# Patient Record
Sex: Female | Born: 1935
Health system: Southern US, Community
[De-identification: ages and names within clinical notes are randomized; demographics above are authoritative.]

## PROBLEM LIST (undated history)

## (undated) DIAGNOSIS — Z9289 Personal history of other medical treatment: Secondary | ICD-10-CM

## (undated) DIAGNOSIS — S82409A Unspecified fracture of shaft of unspecified fibula, initial encounter for closed fracture: Secondary | ICD-10-CM

## (undated) DIAGNOSIS — E119 Type 2 diabetes mellitus without complications: Secondary | ICD-10-CM

## (undated) DIAGNOSIS — N183 Chronic kidney disease, stage 3 unspecified: Secondary | ICD-10-CM

## (undated) DIAGNOSIS — E039 Hypothyroidism, unspecified: Secondary | ICD-10-CM

## (undated) DIAGNOSIS — A159 Respiratory tuberculosis unspecified: Secondary | ICD-10-CM

## (undated) DIAGNOSIS — S82209A Unspecified fracture of shaft of unspecified tibia, initial encounter for closed fracture: Secondary | ICD-10-CM

## (undated) DIAGNOSIS — I10 Essential (primary) hypertension: Secondary | ICD-10-CM

## (undated) DIAGNOSIS — J189 Pneumonia, unspecified organism: Secondary | ICD-10-CM

## (undated) HISTORY — PX: TONSILLECTOMY: SUR1361

## (undated) HISTORY — PX: CATARACT EXTRACTION W/ INTRAOCULAR LENS  IMPLANT, BILATERAL: SHX1307

## (undated) HISTORY — PX: FRACTURE SURGERY: SHX138

## (undated) HISTORY — PX: HIP FRACTURE SURGERY: SHX118

---

## 1999-04-11 ENCOUNTER — Ambulatory Visit (HOSPITAL_COMMUNITY): Admission: RE | Admit: 1999-04-11 | Discharge: 1999-04-11 | Payer: Self-pay | Admitting: Gastroenterology

## 1999-11-08 ENCOUNTER — Ambulatory Visit (HOSPITAL_COMMUNITY): Admission: RE | Admit: 1999-11-08 | Discharge: 1999-11-08 | Payer: Self-pay | Admitting: Endocrinology

## 2000-08-25 ENCOUNTER — Encounter: Payer: Self-pay | Admitting: Endocrinology

## 2000-08-25 ENCOUNTER — Encounter: Admission: RE | Admit: 2000-08-25 | Discharge: 2000-08-25 | Payer: Self-pay | Admitting: Endocrinology

## 2001-06-23 ENCOUNTER — Ambulatory Visit (HOSPITAL_COMMUNITY): Admission: RE | Admit: 2001-06-23 | Discharge: 2001-06-23 | Payer: Self-pay | Admitting: Endocrinology

## 2001-08-28 ENCOUNTER — Encounter: Payer: Self-pay | Admitting: Endocrinology

## 2001-08-28 ENCOUNTER — Encounter: Admission: RE | Admit: 2001-08-28 | Discharge: 2001-08-28 | Payer: Self-pay | Admitting: Endocrinology

## 2002-09-07 ENCOUNTER — Encounter: Payer: Self-pay | Admitting: Endocrinology

## 2002-09-07 ENCOUNTER — Encounter: Admission: RE | Admit: 2002-09-07 | Discharge: 2002-09-07 | Payer: Self-pay | Admitting: Endocrinology

## 2002-11-05 ENCOUNTER — Emergency Department (HOSPITAL_COMMUNITY): Admission: EM | Admit: 2002-11-05 | Discharge: 2002-11-05 | Payer: Self-pay | Admitting: Emergency Medicine

## 2002-11-05 ENCOUNTER — Encounter: Payer: Self-pay | Admitting: Emergency Medicine

## 2003-09-12 ENCOUNTER — Encounter: Admission: RE | Admit: 2003-09-12 | Discharge: 2003-09-12 | Payer: Self-pay | Admitting: Endocrinology

## 2003-09-12 ENCOUNTER — Encounter: Payer: Self-pay | Admitting: Endocrinology

## 2004-10-11 ENCOUNTER — Encounter: Admission: RE | Admit: 2004-10-11 | Discharge: 2004-10-11 | Payer: Self-pay | Admitting: Endocrinology

## 2005-10-25 ENCOUNTER — Encounter: Admission: RE | Admit: 2005-10-25 | Discharge: 2005-10-25 | Payer: Self-pay | Admitting: Endocrinology

## 2005-11-23 ENCOUNTER — Emergency Department (HOSPITAL_COMMUNITY): Admission: EM | Admit: 2005-11-23 | Discharge: 2005-11-23 | Payer: Self-pay | Admitting: Emergency Medicine

## 2006-11-06 ENCOUNTER — Encounter: Admission: RE | Admit: 2006-11-06 | Discharge: 2006-11-06 | Payer: Self-pay | Admitting: Endocrinology

## 2007-11-09 ENCOUNTER — Encounter: Admission: RE | Admit: 2007-11-09 | Discharge: 2007-11-09 | Payer: Self-pay | Admitting: Endocrinology

## 2008-01-11 ENCOUNTER — Encounter: Admission: RE | Admit: 2008-01-11 | Discharge: 2008-01-11 | Payer: Self-pay | Admitting: Endocrinology

## 2008-01-22 ENCOUNTER — Encounter: Admission: RE | Admit: 2008-01-22 | Discharge: 2008-01-22 | Payer: Self-pay | Admitting: Endocrinology

## 2008-11-09 ENCOUNTER — Encounter: Admission: RE | Admit: 2008-11-09 | Discharge: 2008-11-09 | Payer: Self-pay | Admitting: Endocrinology

## 2009-11-17 ENCOUNTER — Encounter: Admission: RE | Admit: 2009-11-17 | Discharge: 2009-11-17 | Payer: Self-pay | Admitting: Endocrinology

## 2010-01-11 ENCOUNTER — Encounter: Admission: RE | Admit: 2010-01-11 | Discharge: 2010-01-11 | Payer: Self-pay | Admitting: Endocrinology

## 2010-01-24 ENCOUNTER — Encounter: Admission: RE | Admit: 2010-01-24 | Discharge: 2010-01-24 | Payer: Self-pay | Admitting: Endocrinology

## 2011-01-20 ENCOUNTER — Encounter: Payer: Self-pay | Admitting: Endocrinology

## 2011-02-08 ENCOUNTER — Other Ambulatory Visit: Payer: Self-pay | Admitting: Endocrinology

## 2011-11-19 ENCOUNTER — Other Ambulatory Visit: Payer: Self-pay | Admitting: Endocrinology

## 2011-11-19 MED ORDER — PROLIA 60 MG/ML ~~LOC~~ SOLN: 60.0000 mg | Freq: Once | SUBCUTANEOUS | Status: DC

## 2011-11-20 ENCOUNTER — Other Ambulatory Visit: Payer: Self-pay | Admitting: Endocrinology

## 2011-11-25 ENCOUNTER — Other Ambulatory Visit (HOSPITAL_COMMUNITY): Payer: Self-pay | Admitting: *Deleted

## 2011-11-27 ENCOUNTER — Ambulatory Visit (HOSPITAL_COMMUNITY)
Admission: RE | Admit: 2011-11-27 | Discharge: 2011-11-27 | Disposition: A | Discharge status: Home / Self Care | Payer: Medicare PPO | Source: Ambulatory Visit | Attending: Endocrinology | Admitting: Endocrinology

## 2011-11-27 DIAGNOSIS — M810 Age-related osteoporosis without current pathological fracture: Secondary | ICD-10-CM | POA: Insufficient documentation

## 2011-11-27 MED ORDER — DENOSUMAB 60 MG/ML ~~LOC~~ SOLN
60.0000 mg | Freq: Once | SUBCUTANEOUS | Status: AC
  Administered 2011-11-27: 60 mg via SUBCUTANEOUS
  Filled 2011-11-27: qty 1

## 2011-11-27 MED ORDER — ZOLEDRONIC ACID 5 MG/100ML IV SOLN: 5.0000 mg | Freq: Once | INTRAVENOUS | Status: DC

## 2012-07-27 ENCOUNTER — Emergency Department (HOSPITAL_COMMUNITY)
Admission: EM | Admit: 2012-07-27 | Discharge: 2012-07-27 | Disposition: A | Discharge status: Home / Self Care | Payer: Medicare HMO | Attending: Emergency Medicine | Admitting: Emergency Medicine

## 2012-07-27 ENCOUNTER — Encounter (HOSPITAL_COMMUNITY): Payer: Self-pay

## 2012-07-27 DIAGNOSIS — E119 Type 2 diabetes mellitus without complications: Secondary | ICD-10-CM | POA: Insufficient documentation

## 2012-07-27 DIAGNOSIS — Z79899 Other long term (current) drug therapy: Secondary | ICD-10-CM | POA: Insufficient documentation

## 2012-07-27 DIAGNOSIS — Z7982 Long term (current) use of aspirin: Secondary | ICD-10-CM | POA: Insufficient documentation

## 2012-07-27 DIAGNOSIS — Z794 Long term (current) use of insulin: Secondary | ICD-10-CM | POA: Insufficient documentation

## 2012-07-27 DIAGNOSIS — R5383 Other fatigue: Secondary | ICD-10-CM | POA: Insufficient documentation

## 2012-07-27 DIAGNOSIS — R5381 Other malaise: Secondary | ICD-10-CM | POA: Insufficient documentation

## 2012-07-27 DIAGNOSIS — E860 Dehydration: Secondary | ICD-10-CM | POA: Insufficient documentation

## 2012-07-27 DIAGNOSIS — R531 Weakness: Secondary | ICD-10-CM

## 2012-07-27 LAB — URINALYSIS, ROUTINE W REFLEX MICROSCOPIC
Glucose, UA: NEGATIVE mg/dL
Hgb urine dipstick: NEGATIVE
Leukocytes, UA: NEGATIVE
Protein, ur: NEGATIVE mg/dL
Specific Gravity, Urine: 1.011 (ref 1.005–1.030)

## 2012-07-27 LAB — CBC WITH DIFFERENTIAL/PLATELET
Basophils Absolute: 0 10*3/uL (ref 0.0–0.1)
Basophils Relative: 0 % (ref 0–1)
Eosinophils Absolute: 0.1 10*3/uL (ref 0.0–0.7)
Eosinophils Relative: 1 % (ref 0–5)
HCT: 39.7 % (ref 36.0–46.0)
Hemoglobin: 13.1 g/dL (ref 12.0–15.0)
MCH: 31 pg (ref 26.0–34.0)
MCHC: 33 g/dL (ref 30.0–36.0)
MCV: 93.9 fL (ref 78.0–100.0)
Monocytes Absolute: 0.5 10*3/uL (ref 0.1–1.0)
Monocytes Relative: 6 % (ref 3–12)
Neutro Abs: 5 10*3/uL (ref 1.7–7.7)
RDW: 13.3 % (ref 11.5–15.5)

## 2012-07-27 LAB — COMPREHENSIVE METABOLIC PANEL
AST: 18 U/L (ref 0–37)
Albumin: 3.4 g/dL — ABNORMAL LOW (ref 3.5–5.2)
BUN: 24 mg/dL — ABNORMAL HIGH (ref 6–23)
Calcium: 9.5 mg/dL (ref 8.4–10.5)
Creatinine, Ser: 1.16 mg/dL — ABNORMAL HIGH (ref 0.50–1.10)
Total Bilirubin: 0.5 mg/dL (ref 0.3–1.2)
Total Protein: 6.4 g/dL (ref 6.0–8.3)

## 2012-07-27 MED ORDER — INSULIN ASPART 100 UNIT/ML ~~LOC~~ SOLN
3.0000 [IU] | Freq: Once | SUBCUTANEOUS | Status: AC
  Administered 2012-07-27: 3 [IU] via SUBCUTANEOUS
  Filled 2012-07-27: qty 3

## 2012-07-27 MED ORDER — SODIUM CHLORIDE 0.9 % IV BOLUS (SEPSIS)
500.0000 mL | Freq: Once | INTRAVENOUS | Status: AC
  Administered 2012-07-27: 500 mL via INTRAVENOUS

## 2012-07-27 NOTE — ED Provider Notes (Signed)
History     CSN: [id number]  Arrival date & time 07/27/12  2956   First MD Initiated Contact with Patient 07/27/12 0945      Chief Complaint  Patient presents with  . Extremity Weakness    (Consider location/radiation/quality/duration/timing/severity/associated sxs/prior treatment) HPI Comments: States she started MiraLAX 3 days ago and has had loose stools for the last 24 hours as she suffers constantly with constipation  Patient is a 78 y.o. female presenting with weakness. The history is provided by the patient.  Weakness Primary symptoms do not include headaches, loss of consciousness, visual change, focal weakness, nausea or vomiting. Primary symptoms comment: States she hasn't felt well for the last week in her legs bilaterally feel weak and numb The symptoms began 5 to 7 days ago. The symptoms are worsening. The neurological symptoms are diffuse.  Additional symptoms include weakness. Additional symptoms do not include lower back pain, leg pain, loss of balance or vertigo. Medical issues also include diabetes.    No past medical history on file.  No past surgical history on file.  No family history on file.  History  Substance Use Topics  . Smoking status: Not on file  . Smokeless tobacco: Not on file  . Alcohol Use: Not on file    OB History    Grav Para Term Preterm Abortions TAB SAB Ect Mult Living                  Review of Systems  Respiratory: Negative for chest tightness.   Cardiovascular: Negative for chest pain and leg swelling.  Gastrointestinal: Negative for nausea, vomiting and abdominal pain.       Loose stool  Genitourinary: Negative for dysuria and flank pain.  Neurological: Positive for weakness and numbness. Negative for vertigo, focal weakness, loss of consciousness, headaches and loss of balance.  All other systems reviewed and are negative.    Allergies  Review of patient's allergies indicates no known allergies.  Home Medications    Current Outpatient Rx  Name Route Sig Dispense Refill  . ASPIRIN EC 81 MG PO TBEC Oral Take 81 mg by mouth daily.    . ATORVASTATIN CALCIUM 10 MG PO TABS Oral Take 10 mg by mouth daily.    Marland Kitchen CALCIUM CARBONATE-VITAMIN D 600-400 MG-UNIT PO TABS Oral Take 1 tablet by mouth daily.    Marland Kitchen VITAMIN D3 1000 UNITS PO CAPS Oral Take 1 capsule by mouth daily.    Marland Kitchen ESTAZOLAM 1 MG PO TABS Oral Take 1 mg by mouth at bedtime.    Marland Kitchen GLIMEPIRIDE 2 MG PO TABS Oral Take 2 mg by mouth daily before breakfast.    . INSULIN ASPART 100 UNIT/ML [place] SOLN Subcutaneous Inject 3 Units into the skin 3 (three) times daily before meals. If blood sugar is over 200, take 5 units and if they go over 300, take 6 units at that meal.    . INSULIN GLARGINE 100 UNIT/ML [place] SOLN Subcutaneous Inject 4 Units into the skin at bedtime.    Marland Kitchen LEVOTHYROXINE SODIUM 88 MCG PO TABS Oral Take 88 mcg by mouth daily before breakfast.    . PREGABALIN 50 MG PO CAPS Oral Take 50 mg by mouth 2 (two) times daily.    . TRAMADOL HCL 50 MG PO TABS Oral Take 50 mg by mouth 2 (two) times daily.    Marland Kitchen PROLIA 60 MG/ML [place] SOLN Subcutaneous Inject 60 mg into the skin once. Administer in upper arm, thigh, or abdomen  1 mL 1    Dispense as written.    BP 166/50  Pulse 68  Temp 97.8 F (36.6 C) (Oral)  Resp 18  SpO2 100%  Physical Exam  Nursing note and vitals reviewed. Constitutional: She is oriented to person, place, and time. She appears well-developed and well-nourished. No distress.  HENT:  Head: Normocephalic and atraumatic.  Mouth/Throat: Oropharynx is clear and moist.  Eyes: Conjunctivae and EOM are normal. Pupils are equal, round, and reactive to light.  Neck: Normal range of motion. Neck supple.  Cardiovascular: Normal rate, regular rhythm and intact distal pulses.   No murmur heard. Pulmonary/Chest: Effort normal and breath sounds normal. No respiratory distress. She has no wheezes. She has no rales.  Abdominal: Soft. She exhibits no  distension. There is no tenderness. There is no rebound and no guarding.  Musculoskeletal: Normal range of motion. She exhibits no edema and no tenderness.  Neurological: She is alert and oriented to person, place, and time. She has normal strength. No cranial nerve deficit. GCS eye subscore is 4. GCS verbal subscore is 5. GCS motor subscore is 6.       Patient has sensation to painful stimuli in both feet and tib-fib area  Skin: Skin is warm and dry. No rash noted. No erythema.  Psychiatric: She has a normal mood and affect. Her behavior is normal.    ED Course  Procedures (including critical care time)  Labs Reviewed  COMPREHENSIVE METABOLIC PANEL - Abnormal; Notable for the following:    Glucose, Bld 199 (*)     BUN 24 (*)     Creatinine, Ser 1.16 (*)     Albumin 3.4 (*)     GFR calc non Af Amer 39 (*)     GFR calc Af Amer 45 (*)     All other components within normal limits  URINALYSIS, ROUTINE W REFLEX MICROSCOPIC - Abnormal; Notable for the following:    Ketones, ur 15 (*)     All other components within normal limits  CBC WITH DIFFERENTIAL  TROPONIN I  URINE CULTURE   No results found.   Date: 07/27/2012  Rate: 62  Rhythm: normal sinus rhythm  QRS Axis: normal  Intervals: normal  ST/T Wave abnormalities: nonspecific ST/T changes  Conduction Disutrbances:right bundle branch block and first degree AV block  Narrative Interpretation:   Old EKG Reviewed: none available   No diagnosis found.    MDM   Patient today complaining of generalized weakness over the last one week and not feeling well. She states her feet are numb bilaterally and she's had diarrhea for the last day. However she states that she started MiraLAX 3 days ago due to constant constipation. She also states that she has neuropathy in her feet and on exam she has sensation to painful stimuli. She has no signs of focal deficits and can move both of her lower extremities. She has normal rectal tone. She  does have soft brown stool present on exam without any signs of rectal bleeding. She has no abdominal pain or respiratory symptoms. Low concern for respiratory issues or cardiac issues. EKG shows a right bundle branch block but otherwise unremarkable.  Feel the patient may have dehydration versus UTI. CBC, CMP, UA, troponin pending.  12:08 PM All labs normal other than mild dehydration and hyperglycemia of 199. Patient states that she recently started on Lantus and her blood sugars are improving. Feel that her symptoms started right after starting the Lantus and this  is most likely the cause for her symptoms but she is having more control blood sugar. Initially she states it used to run fairly high.  We'll complete the bag of normal saline and allow patient to eat. Will give her home dose of 3 units of insulin.  Will ensure that she is able to get up and move around and feels safe to go home   2:06 PM Patient was able to walk and felt much better. We'll discharge him to followup with her PCP   Gwyneth Sprout, MD 07/27/12 1406

## 2012-07-27 NOTE — ED Notes (Signed)
Pt here by ems for extremity weakness in bilateral lower legs, pt sts numbness also, reports onset 1 week ago. Hx of neuropathy and DM

## 2012-07-28 LAB — URINE CULTURE
Colony Count: NO GROWTH
Culture: NO GROWTH

## 2013-03-15 ENCOUNTER — Observation Stay (HOSPITAL_COMMUNITY)
Admission: EM | Admit: 2013-03-15 | Discharge: 2013-03-16 | Disposition: A | Discharge status: Home / Self Care | Payer: Medicare HMO | Attending: Internal Medicine | Admitting: Internal Medicine

## 2013-03-15 ENCOUNTER — Encounter (HOSPITAL_COMMUNITY): Payer: Self-pay | Admitting: Emergency Medicine

## 2013-03-15 ENCOUNTER — Emergency Department (HOSPITAL_COMMUNITY): Payer: Medicare HMO

## 2013-03-15 DIAGNOSIS — E1140 Type 2 diabetes mellitus with diabetic neuropathy, unspecified: Secondary | ICD-10-CM | POA: Diagnosis present

## 2013-03-15 DIAGNOSIS — E039 Hypothyroidism, unspecified: Secondary | ICD-10-CM | POA: Diagnosis present

## 2013-03-15 DIAGNOSIS — I080 Rheumatic disorders of both mitral and aortic valves: Secondary | ICD-10-CM | POA: Insufficient documentation

## 2013-03-15 DIAGNOSIS — E1149 Type 2 diabetes mellitus with other diabetic neurological complication: Secondary | ICD-10-CM | POA: Insufficient documentation

## 2013-03-15 DIAGNOSIS — M6281 Muscle weakness (generalized): Secondary | ICD-10-CM | POA: Insufficient documentation

## 2013-03-15 DIAGNOSIS — R030 Elevated blood-pressure reading, without diagnosis of hypertension: Secondary | ICD-10-CM | POA: Insufficient documentation

## 2013-03-15 DIAGNOSIS — I498 Other specified cardiac arrhythmias: Secondary | ICD-10-CM | POA: Insufficient documentation

## 2013-03-15 DIAGNOSIS — E1142 Type 2 diabetes mellitus with diabetic polyneuropathy: Secondary | ICD-10-CM | POA: Insufficient documentation

## 2013-03-15 DIAGNOSIS — E119 Type 2 diabetes mellitus without complications: Secondary | ICD-10-CM

## 2013-03-15 DIAGNOSIS — R001 Bradycardia, unspecified: Secondary | ICD-10-CM

## 2013-03-15 DIAGNOSIS — R079 Chest pain, unspecified: Principal | ICD-10-CM | POA: Insufficient documentation

## 2013-03-15 DIAGNOSIS — I10 Essential (primary) hypertension: Secondary | ICD-10-CM

## 2013-03-15 HISTORY — DX: Respiratory tuberculosis unspecified: A15.9

## 2013-03-15 HISTORY — DX: Hypothyroidism, unspecified: E03.9

## 2013-03-15 LAB — HEMOGLOBIN A1C
Hgb A1c MFr Bld: 7.6 % — ABNORMAL HIGH (ref <5.7)
Mean Plasma Glucose: 171 mg/dL — ABNORMAL HIGH (ref <117)

## 2013-03-15 LAB — CBC WITH DIFFERENTIAL/PLATELET
Basophils Absolute: 0 10*3/uL (ref 0.0–0.1)
Eosinophils Absolute: 0 10*3/uL (ref 0.0–0.7)
Eosinophils Relative: 0 % (ref 0–5)
Lymphocytes Relative: 33 % (ref 12–46)
Lymphs Abs: 2.7 10*3/uL (ref 0.7–4.0)
Neutrophils Relative %: 61 % (ref 43–77)
Platelets: 146 10*3/uL — ABNORMAL LOW (ref 150–400)
RBC: 3.89 MIL/uL (ref 3.87–5.11)
RDW: 13.2 % (ref 11.5–15.5)
WBC: 8.2 10*3/uL (ref 4.0–10.5)

## 2013-03-15 LAB — URINALYSIS, ROUTINE W REFLEX MICROSCOPIC
Bilirubin Urine: NEGATIVE
Glucose, UA: NEGATIVE mg/dL
Hgb urine dipstick: NEGATIVE
Specific Gravity, Urine: 1.019 (ref 1.005–1.030)
Urobilinogen, UA: 0.2 mg/dL (ref 0.0–1.0)

## 2013-03-15 LAB — COMPREHENSIVE METABOLIC PANEL
ALT: 12 U/L (ref 0–35)
AST: 19 U/L (ref 0–37)
Alkaline Phosphatase: 65 U/L (ref 39–117)
CO2: 26 mEq/L (ref 19–32)
Calcium: 9.8 mg/dL (ref 8.4–10.5)
GFR calc Af Amer: 44 mL/min — ABNORMAL LOW (ref >90)
Glucose, Bld: 205 mg/dL — ABNORMAL HIGH (ref 70–99)
Potassium: 4.7 mEq/L (ref 3.5–5.1)
Sodium: 141 mEq/L (ref 135–145)
Total Protein: 6.3 g/dL (ref 6.0–8.3)

## 2013-03-15 LAB — POCT I-STAT TROPONIN I: Troponin i, poc: 0.01 ng/mL (ref 0.00–0.08)

## 2013-03-15 LAB — URINE MICROSCOPIC-ADD ON

## 2013-03-15 LAB — GLUCOSE, CAPILLARY: Glucose-Capillary: 179 mg/dL — ABNORMAL HIGH (ref 70–99)

## 2013-03-15 LAB — MAGNESIUM: Magnesium: 2.1 mg/dL (ref 1.5–2.5)

## 2013-03-15 MED ORDER — LEVOTHYROXINE SODIUM 88 MCG PO TABS
88.0000 ug | ORAL_TABLET | Freq: Every day | ORAL | Status: DC
  Administered 2013-03-16: 88 ug via ORAL
  Filled 2013-03-15 (×2): qty 1

## 2013-03-15 MED ORDER — ONDANSETRON HCL 4 MG/2ML IJ SOLN: 4.0000 mg | Freq: Four times a day (QID) | INTRAMUSCULAR | Status: DC | PRN

## 2013-03-15 MED ORDER — INSULIN ASPART 100 UNIT/ML ~~LOC~~ SOLN
0.0000 [IU] | Freq: Three times a day (TID) | SUBCUTANEOUS | Status: DC
  Administered 2013-03-15: 3 [IU] via SUBCUTANEOUS
  Administered 2013-03-16 (×2): 2 [IU] via SUBCUTANEOUS

## 2013-03-15 MED ORDER — HYDRALAZINE HCL 20 MG/ML IJ SOLN: 10.0000 mg | Freq: Four times a day (QID) | INTRAMUSCULAR | Status: DC | PRN

## 2013-03-15 MED ORDER — ASPIRIN EC 81 MG PO TBEC
81.0000 mg | DELAYED_RELEASE_TABLET | Freq: Every day | ORAL | Status: DC
  Administered 2013-03-16: 81 mg via ORAL
  Filled 2013-03-15: qty 1

## 2013-03-15 MED ORDER — SODIUM CHLORIDE 0.9 % IV SOLN: INTRAVENOUS | Status: DC

## 2013-03-15 MED ORDER — HEPARIN SODIUM (PORCINE) 5000 UNIT/ML IJ SOLN
5000.0000 [IU] | Freq: Three times a day (TID) | INTRAMUSCULAR | Status: DC
  Administered 2013-03-15 – 2013-03-16 (×3): 5000 [IU] via SUBCUTANEOUS
  Filled 2013-03-15 (×5): qty 1

## 2013-03-15 MED ORDER — ACETAMINOPHEN 325 MG PO TABS: 650.0000 mg | ORAL_TABLET | Freq: Four times a day (QID) | ORAL | Status: DC | PRN

## 2013-03-15 MED ORDER — PREGABALIN 50 MG PO CAPS
50.0000 mg | ORAL_CAPSULE | Freq: Two times a day (BID) | ORAL | Status: DC
  Administered 2013-03-15 – 2013-03-16 (×2): 50 mg via ORAL
  Filled 2013-03-15 (×2): qty 1

## 2013-03-15 MED ORDER — ACETAMINOPHEN 650 MG RE SUPP: 650.0000 mg | Freq: Four times a day (QID) | RECTAL | Status: DC | PRN

## 2013-03-15 MED ORDER — ZOLPIDEM TARTRATE 5 MG PO TABS
5.0000 mg | ORAL_TABLET | Freq: Every evening | ORAL | Status: DC | PRN
  Administered 2013-03-15: 5 mg via ORAL
  Filled 2013-03-15: qty 1

## 2013-03-15 MED ORDER — ALBUTEROL SULFATE (5 MG/ML) 0.5% IN NEBU: 2.5000 mg | INHALATION_SOLUTION | RESPIRATORY_TRACT | Status: DC | PRN

## 2013-03-15 MED ORDER — PANTOPRAZOLE SODIUM 40 MG PO TBEC
40.0000 mg | DELAYED_RELEASE_TABLET | Freq: Every day | ORAL | Status: DC
  Administered 2013-03-15: 40 mg via ORAL
  Filled 2013-03-15: qty 1

## 2013-03-15 MED ORDER — SODIUM CHLORIDE 0.9 % IJ SOLN
3.0000 mL | Freq: Two times a day (BID) | INTRAMUSCULAR | Status: DC
  Administered 2013-03-15 – 2013-03-16 (×2): 3 mL via INTRAVENOUS

## 2013-03-15 MED ORDER — ONDANSETRON HCL 4 MG PO TABS: 4.0000 mg | ORAL_TABLET | Freq: Four times a day (QID) | ORAL | Status: DC | PRN

## 2013-03-15 MED ORDER — ASPIRIN 81 MG PO CHEW
324.0000 mg | CHEWABLE_TABLET | Freq: Once | ORAL | Status: AC
  Administered 2013-03-15: 324 mg via ORAL
  Filled 2013-03-15: qty 4

## 2013-03-15 NOTE — ED Notes (Signed)
Cardiology at bedside.

## 2013-03-15 NOTE — ED Notes (Signed)
Per EMS: pt c/o heartburn and pain in back; pt does not describe it as pain; pt states it is discomfort; 12 lead shows RBBB; 18 L forearm; no ASA or nitro given en route; BP 196/75 HR 78 regular 97% on RA CBG 206; pt diabetic; pt from home; pt alert and oriented

## 2013-03-15 NOTE — ED Notes (Signed)
Verified with internal medicine MD that pt can eat and drink

## 2013-03-15 NOTE — ED Notes (Signed)
Pharmacy at the bedside

## 2013-03-15 NOTE — ED Notes (Signed)
Pt alert and mentating appropriately; pt denies pain in chest and states more of a burning discomfort pain from belly; pt denies dizziness and lightheadedness; pt denies numbness and tingling; pt denies n/v/d; pt denies blurred vision and headache

## 2013-03-15 NOTE — H&P (Signed)
[facility] History and Physical  DAWNETTE Dominguez XBM:[id number] DOB: [birth year] DOA: 03/15/2013   PCP: Reather Littler, MD  Specialists: None  Chief Complaint: Burning sensation in the left side of the chest  HPI: [name] is a 79 y.o. female with a past medical history of for type 2 diabetes, hypothyroidism, diabetic neuropathy who was in her usual state of health last night when she stated that she didn't feel well. Has been feeling weak for the past few months. She had a light dinner. And, then after dinner she had some burning sensation in the left side of her chest. She chewed couple of antacids. She went to bed at about 11 PM. She woke up at 2:30 in the morning with burning sensation in the left chest. She again took 2 antacids and then subsequently took aspirin. She stayed up most of the night. The discomfort was 7/10 in intensity. It radiated under her left armpit. Denies any nausea. No fever or chills. Did feel nervous and had palpitations (felt as if heart was racing at times). Denies any shortness of breath. No dizziness, per se. She admits to periodic foot swelling once in a while. She says, that she had similar symptoms many, many years ago, but nothing recently. She got concerned by her symptoms. She called her son. And they called the patient's primary care physician's office who recommended calling 911. Patient was brought into the hospital. A little while after coming to the ED, her pain resolved. However, she still feels palpitations. Denies any changes to her medications recently. Has been feeling weak as mentioned earlier over the last few months without any discernible etiology.  Home Medications: Prior to Admission medications   Medication Sig Start Date End Date Taking? Authorizing Provider  aspirin EC 81 MG tablet Take 81 mg by mouth daily.   Yes Historical Provider, MD  Calcium Carbonate-Vitamin D (CALTRATE 600+D) 600-400 MG-UNIT per tablet Take 1 tablet by mouth  daily.   Yes Historical Provider, MD  estazolam (PROSOM) 1 MG tablet Take 1 mg by mouth at bedtime.   Yes Historical Provider, MD  glimepiride (AMARYL) 2 MG tablet Take 2 mg by mouth daily before breakfast.   Yes Historical Provider, MD  insulin aspart (NOVOLOG FLEXPEN) 100 UNIT/ML injection Inject 3 Units into the skin 3 (three) times daily before meals. If blood sugar is over 200, take 5 units and if they go over 300, take 6 units at that meal.   Yes Historical Provider, MD  levothyroxine (SYNTHROID, LEVOTHROID) 88 MCG tablet Take 88 mcg by mouth daily before breakfast.   Yes Historical Provider, MD  pregabalin (LYRICA) 50 MG capsule Take 50 mg by mouth 2 (two) times daily.   Yes Historical Provider, MD  traMADol (ULTRAM) 50 MG tablet Take 50 mg by mouth 2 (two) times daily.   Yes Historical Provider, MD    Allergies: No Known Allergies  Past Medical History: Past Medical History  Diagnosis Date  . Diabetes mellitus without complication     Past Surgical History  Procedure Laterality Date  . Hip fracture surgery      both hips    Social History:  reports that she has never smoked. She does not have any smokeless tobacco history on file. She reports that she does not drink alcohol or use illicit drugs.  Living Situation: She lives alone Activity Level: Uses a walker to ambulate, but is quite independent. She plays bridge and goes out for dinner periodically. Has a  reasonably good quality of life.   Family History:  Family History  Problem Relation Age of Onset  . Diabetes Father      Review of Systems - History obtained from the patient General ROS: positive for  - fatigue Psychological ROS: negative Ophthalmic ROS: negative ENT ROS: negative Allergy and Immunology ROS: negative Hematological and Lymphatic ROS: negative Endocrine ROS: negative Respiratory ROS: no cough, shortness of breath, or wheezing Cardiovascular ROS: as in hpi Gastrointestinal ROS: no abdominal pain,  change in bowel habits, or black or bloody stools Genito-Urinary ROS: no dysuria, trouble voiding, or hematuria Musculoskeletal ROS: negative Neurological ROS: no TIA or stroke symptoms Dermatological ROS: negative  Physical Examination  Filed Vitals:   03/15/13 1238 03/15/13 1315 03/15/13 1530 03/15/13 1557  BP: 169/63 178/51 149/76   Pulse: 55 51  49  Temp: 97.6 F (36.4 C)   98.3 F (36.8 C)  TempSrc: Oral   Oral  Resp: 16 11 16 16   SpO2: 99% 99% 99% 100%    General appearance: alert, cooperative, appears stated age and no distress Head: Normocephalic, without obvious abnormality, atraumatic Eyes: conjunctivae/corneas clear. PERRL, EOM's intact. Throat: lips, mucosa, and tongue normal; teeth and gums normal Neck: no adenopathy, no carotid bruit, no JVD, supple, symmetrical, trachea midline and thyroid not enlarged, symmetric, no tenderness/mass/nodules Resp: coarse breath sounds, no crackles or wheezing. Cardio: S1-S2, bradycardic. Systolic murmur appreciated at the aortic area. 3/6. No rubs, or bruits. GI: soft, non-tender; bowel sounds normal; no masses,  no organomegaly Extremities: extremities normal, atraumatic, no cyanosis. Minimal edema LE. No calf tenderness.  Pulses: 2+ and symmetric Skin: Skin color, texture, turgor normal. No rashes or lesions Lymph nodes: Cervical, supraclavicular, and axillary nodes normal. Neurologic: She is alert and oriented x3. No cranial nerve deficits. Normal motor strength. Gait not assessed.  Laboratory Data: Results for orders placed during the hospital encounter of 03/15/13 (from the past 48 hour(s))  CBC WITH DIFFERENTIAL     Status: Abnormal   Collection Time    03/15/13  1:18 PM      Result Value Range   WBC 8.2  4.0 - 10.5 K/uL   RBC 3.89  3.87 - 5.11 MIL/uL   Hemoglobin 12.4  12.0 - 15.0 g/dL   HCT 45.4 (*) 09.8 - 11.9 %   MCV 91.5  78.0 - 100.0 fL   MCH 31.9  26.0 - 34.0 pg   MCHC 34.8  30.0 - 36.0 g/dL   RDW 14.7  82.9 -  56.2 %   Platelets 146 (*) 150 - 400 K/uL   Neutrophils Relative 61  43 - 77 %   Neutro Abs 5.0  1.7 - 7.7 K/uL   Lymphocytes Relative 33  12 - 46 %   Lymphs Abs 2.7  0.7 - 4.0 K/uL   Monocytes Relative 6  3 - 12 %   Monocytes Absolute 0.5  0.1 - 1.0 K/uL   Eosinophils Relative 0  0 - 5 %   Eosinophils Absolute 0.0  0.0 - 0.7 K/uL   Basophils Relative 0  0 - 1 %   Basophils Absolute 0.0  0.0 - 0.1 K/uL  COMPREHENSIVE METABOLIC PANEL     Status: Abnormal   Collection Time    03/15/13  1:18 PM      Result Value Range   Sodium 141  135 - 145 mEq/L   Potassium 4.7  3.5 - 5.1 mEq/L   Chloride 107  96 - 112 mEq/L  CO2 26  19 - 32 mEq/L   Glucose, Bld 205 (*) 70 - 99 mg/dL   BUN 30 (*) 6 - 23 mg/dL   Creatinine, Ser 1.61 (*) 0.50 - 1.10 mg/dL   Calcium 9.8  8.4 - 09.6 mg/dL   Total Protein 6.3  6.0 - 8.3 g/dL   Albumin 3.4 (*) 3.5 - 5.2 g/dL   AST 19  0 - 37 U/L   ALT 12  0 - 35 U/L   Alkaline Phosphatase 65  39 - 117 U/L   Total Bilirubin 0.5  0.3 - 1.2 mg/dL   GFR calc non Af Amer 38 (*) >90 mL/min   GFR calc Af Amer 44 (*) >90 mL/min   Comment:            The eGFR has been calculated     using the CKD EPI equation.     This calculation has not been     validated in all clinical     situations.     eGFR's persistently     <90 mL/min signify     possible Chronic Kidney Disease.  LIPASE, BLOOD     Status: None   Collection Time    03/15/13  1:18 PM      Result Value Range   Lipase 16  11 - 59 U/L  URINALYSIS, ROUTINE W REFLEX MICROSCOPIC     Status: Abnormal   Collection Time    03/15/13  2:26 PM      Result Value Range   Color, Urine YELLOW  YELLOW   APPearance CLEAR  CLEAR   Specific Gravity, Urine 1.019  1.005 - 1.030   pH 6.5  5.0 - 8.0   Glucose, UA NEGATIVE  NEGATIVE mg/dL   Hgb urine dipstick NEGATIVE  NEGATIVE   Bilirubin Urine NEGATIVE  NEGATIVE   Ketones, ur 15 (*) NEGATIVE mg/dL   Protein, ur NEGATIVE  NEGATIVE mg/dL   Urobilinogen, UA 0.2  0.0 - 1.0  mg/dL   Nitrite NEGATIVE  NEGATIVE   Leukocytes, UA SMALL (*) NEGATIVE  URINE MICROSCOPIC-ADD ON     Status: Abnormal   Collection Time    03/15/13  2:26 PM      Result Value Range   Squamous Epithelial / LPF FEW (*) RARE   WBC, UA 0-2  <3 WBC/hpf   Bacteria, UA FEW (*) RARE  POCT I-STAT TROPONIN I     Status: None   Collection Time    03/15/13  2:38 PM      Result Value Range   Troponin i, poc 0.01  0.00 - 0.08 ng/mL   Comment 3            Comment: Due to the release kinetics of cTnI,     a negative result within the first hours     of the onset of symptoms does not rule out     myocardial infarction with certainty.     If myocardial infarction is still suspected,     repeat the test at appropriate intervals.    Radiology Reports: Dg Chest 2 View  03/15/2013  *RADIOLOGY REPORT*  Clinical Data: Chest pain, diabetes  CHEST - 2 VIEW  Comparison: 11/17/2009  Findings: Hyperinflation noted compatible with chronic COPD/emphysema.  Calcified granulomas present bilaterally, most concentrated in the right lower lobe.  Stable heart size and vascularity.  No superimposed edema, pneumonia, collapse, consolidation, effusion or pneumothorax.  No significant interval change.  Atherosclerosis of the aorta.  IMPRESSION: COPD/emphysema  Chronic granulomatous disease  Stable exam without superimposed acute process   Original Report Authenticated By: Judie Petit. Miles Costain, M.D.     Electrocardiogram: Bradycardia at 55 beats per minute. First degree AV block. Right bundle branch block is noted. No Q waves. No concerning ST or T-wave changes are noted. EKG compared to EKG from 2013 and appears to be similar. Heart rate appears to be slower though.  Problem List  Principal Problem:   Chest pain at rest Active Problems:   Bradycardia   Hypothyroidism   DM type 2 (diabetes mellitus, type 2)   Diabetic neuropathy   Generalized weakness   Assessment: This is a 79 year old, Caucasian female, with health problems  as stated earlier, who presents with chest pain, which is described as a burning sensation in the left side of the chest. She's also had palpitations. The pain is somewhat atypical for coronary artery disease. It sounds more likely GI in etiology. However patient was found to be bradycardic with heart rate fluctuating between 40 and 60. EKG shows sinus bradycardia with first degree AV block. Possible that the patient has sick sinus syndrome. And this could be the reason for her weakness over the last many months.   Plan: #1 burning sensation in the chest: We will cycle troponins. We will put her on a proton pump inhibitor. EKG will be repeated. Echocardiogram will be obtained to look for wall motion abnormalities.  #2 significant bradycardia with first degree AV block: Concerning for sick sinus syndrome. Because patient does have a reasonably good quality of life we'll go ahead and consult cardiology to evaluate her. Patient and family prefers Dr. Eden Emms or one of his colleagues.  We will check a TSH and free T4 level. Check magnesium level. Repeat EKG and follow up on echocardiogram. We will monitor her on telemetry.  #3 history of hypothyroidism: Check TSH and free T4. Continue with Synthroid.  #4 history of type 2 diabetes: Placed on sliding scale. Hold her glipizide for now. HbA1c will be checked.  #5 history of diabetic neuropathy: She is on Lyrica.  #6 mildly elevated BUN and creatinine: She does have chronic kidney disease. Appears to be at baseline. Continue to monitor.  #7 abnormal, UA: Not very impressive for being urinary tract infection. Hold off on antibiotics.   DVT Prophylaxis: . Subcutaneous heparin  Code Status:  CODE STATUS was discussed with the patient in the presence of her son and daughter-in-law. She was very clear that she did not want to be resuscitated or intubated. She will be a DO NOT RESUSCITATE  Family Communication:  Discussed with the patient and her family at  bedside   Disposition Plan:  Will likely return home in improved. We will go and consult physical and occupational therapy due to her weakness.    Further management decisions will depend on results of further testing and patient's response to treatment.  [facility]  [facility] Pager [phone]  If 7PM-7AM, please contact night-coverage [email or web address] Password [facility]  03/15/2013, 4:05 PM

## 2013-03-15 NOTE — ED Provider Notes (Signed)
History     CSN: [id number]  Arrival date & time 03/15/13  1214   First MD Initiated Contact with Patient 03/15/13 1247      Chief Complaint  Patient presents with  . Chest Pain    (Consider location/radiation/quality/duration/timing/severity/associated sxs/prior treatment) HPI [name] is a 79 y.o. female who presents to ED with complaint of chest pain since last night. States developed burning sensation in left chest, radiating into left axilla nad left upper back. States went to bed, and woke up with worsening pain. States thought it was acid reflux. States took some "anti acids." States it did not help. State wasn able to fall asleep for about 30 min, and has been up since then due to pain. States took aspirin this morning, 81mg . States did not feel well this morning. Did not take any of her medications. States no fever, no cough, no nausea, vomiting, dizziness, no shortness of breath, no abdominal pain. States feels weak.    Past Medical History  Diagnosis Date  . Diabetes mellitus without complication     History reviewed. No pertinent past surgical history.  History reviewed. No pertinent family history.  History  Substance Use Topics  . Smoking status: Not on file  . Smokeless tobacco: Not on file  . Alcohol Use: Not on file    OB History   Grav Para Term Preterm Abortions TAB SAB Ect Mult Living                  Review of Systems  Constitutional: Negative for fever and chills.  HENT: Negative for neck pain and neck stiffness.   Eyes: Negative for photophobia and visual disturbance.  Respiratory: Positive for chest tightness. Negative for cough, shortness of breath and wheezing.   Cardiovascular: Positive for chest pain and leg swelling. Negative for palpitations.  Gastrointestinal: Negative for abdominal pain.  Genitourinary: Negative for flank pain.  Musculoskeletal: Negative.   Skin: Negative.   Neurological: Positive for weakness. Negative for  dizziness, light-headedness and headaches.  Hematological: Negative.     Allergies  Review of patient's allergies indicates no known allergies.  Home Medications   Current Outpatient Rx  Name  Route  Sig  Dispense  Refill  . aspirin EC 81 MG tablet   Oral   Take 81 mg by mouth daily.         Marland Kitchen atorvastatin (LIPITOR) 10 MG tablet   Oral   Take 10 mg by mouth daily.         . Calcium Carbonate-Vitamin D (CALTRATE 600+D) 600-400 MG-UNIT per tablet   Oral   Take 1 tablet by mouth daily.         . Cholecalciferol (VITAMIN D3) 1000 UNITS CAPS   Oral   Take 1 capsule by mouth daily.         Marland Kitchen estazolam (PROSOM) 1 MG tablet   Oral   Take 1 mg by mouth at bedtime.         Marland Kitchen glimepiride (AMARYL) 2 MG tablet   Oral   Take 2 mg by mouth daily before breakfast.         . insulin aspart (NOVOLOG FLEXPEN) 100 UNIT/ML injection   Subcutaneous   Inject 3 Units into the skin 3 (three) times daily before meals. If blood sugar is over 200, take 5 units and if they go over 300, take 6 units at that meal.         . insulin glargine (LANTUS)  100 UNIT/ML injection   Subcutaneous   Inject 4 Units into the skin at bedtime.         Marland Kitchen levothyroxine (SYNTHROID, LEVOTHROID) 88 MCG tablet   Oral   Take 88 mcg by mouth daily before breakfast.         . pregabalin (LYRICA) 50 MG capsule   Oral   Take 50 mg by mouth 2 (two) times daily.         Marland Kitchen PROLIA 60 MG/ML SOLN injection   Subcutaneous   Inject 60 mg into the skin once. Administer in upper arm, thigh, or abdomen   1 mL   1     Dispense as written.   . traMADol (ULTRAM) 50 MG tablet   Oral   Take 50 mg by mouth 2 (two) times daily.           BP 169/63  Pulse 55  Temp(Src) 97.6 F (36.4 C) (Oral)  Resp 16  SpO2 99%  Physical Exam  Nursing note and vitals reviewed. Constitutional: She is oriented to person, place, and time. She appears well-developed and well-nourished. No distress.  HENT:  Head:  Normocephalic.  Nose: Nose normal.  Mouth/Throat: Oropharynx is clear and moist.  Eyes: Conjunctivae and EOM are normal. Pupils are equal, round, and reactive to light.  Neck: Neck supple.  Cardiovascular: Normal rate, regular rhythm and normal heart sounds.   Pulmonary/Chest: Effort normal and breath sounds normal. No respiratory distress. She has no wheezes. She has no rales.  Abdominal: Soft. Bowel sounds are normal. She exhibits no distension. There is no tenderness. There is no rebound.  Musculoskeletal: She exhibits no edema.  Neurological: She is alert and oriented to person, place, and time.  Skin: Skin is warm and dry.    ED Course  Procedures (including critical care time)   Date: 03/15/2013  Rate: 55  Rhythm: sinus bradycardia  QRS Axis: normal  Intervals: normal  ST/T Wave abnormalities: normal  Conduction Disutrbances:first-degree A-V block , left bundle branch block and left anterior fascicular block  Narrative Interpretation:   Old EKG Reviewed: none available  Pt with CP all through the night. Now resolved. Aspirin ordered. Will get labs,  CXR.   Results for orders placed during the hospital encounter of 03/15/13  CBC WITH DIFFERENTIAL      Result Value Range   WBC 8.2  4.0 - 10.5 K/uL   RBC 3.89  3.87 - 5.11 MIL/uL   Hemoglobin 12.4  12.0 - 15.0 g/dL   HCT 16.1 (*) 09.6 - 04.5 %   MCV 91.5  78.0 - 100.0 fL   MCH 31.9  26.0 - 34.0 pg   MCHC 34.8  30.0 - 36.0 g/dL   RDW 40.9  81.1 - 91.4 %   Platelets 146 (*) 150 - 400 K/uL   Neutrophils Relative 61  43 - 77 %   Neutro Abs 5.0  1.7 - 7.7 K/uL   Lymphocytes Relative 33  12 - 46 %   Lymphs Abs 2.7  0.7 - 4.0 K/uL   Monocytes Relative 6  3 - 12 %   Monocytes Absolute 0.5  0.1 - 1.0 K/uL   Eosinophils Relative 0  0 - 5 %   Eosinophils Absolute 0.0  0.0 - 0.7 K/uL   Basophils Relative 0  0 - 1 %   Basophils Absolute 0.0  0.0 - 0.1 K/uL  COMPREHENSIVE METABOLIC PANEL      Result Value Range   Sodium  141   135 - 145 mEq/L   Potassium 4.7  3.5 - 5.1 mEq/L   Chloride 107  96 - 112 mEq/L   CO2 26  19 - 32 mEq/L   Glucose, Bld 205 (*) 70 - 99 mg/dL   BUN 30 (*) 6 - 23 mg/dL   Creatinine, Ser 1.61 (*) 0.50 - 1.10 mg/dL   Calcium 9.8  8.4 - 09.6 mg/dL   Total Protein 6.3  6.0 - 8.3 g/dL   Albumin 3.4 (*) 3.5 - 5.2 g/dL   AST 19  0 - 37 U/L   ALT 12  0 - 35 U/L   Alkaline Phosphatase 65  39 - 117 U/L   Total Bilirubin 0.5  0.3 - 1.2 mg/dL   GFR calc non Af Amer 38 (*) >90 mL/min   GFR calc Af Amer 44 (*) >90 mL/min  LIPASE, BLOOD      Result Value Range   Lipase 16  11 - 59 U/L  URINALYSIS, ROUTINE W REFLEX MICROSCOPIC      Result Value Range   Color, Urine YELLOW  YELLOW   APPearance CLEAR  CLEAR   Specific Gravity, Urine 1.019  1.005 - 1.030   pH 6.5  5.0 - 8.0   Glucose, UA NEGATIVE  NEGATIVE mg/dL   Hgb urine dipstick NEGATIVE  NEGATIVE   Bilirubin Urine NEGATIVE  NEGATIVE   Ketones, ur 15 (*) NEGATIVE mg/dL   Protein, ur NEGATIVE  NEGATIVE mg/dL   Urobilinogen, UA 0.2  0.0 - 1.0 mg/dL   Nitrite NEGATIVE  NEGATIVE   Leukocytes, UA SMALL (*) NEGATIVE  URINE MICROSCOPIC-ADD ON      Result Value Range   Squamous Epithelial / LPF FEW (*) RARE   WBC, UA 0-2  <3 WBC/hpf   Bacteria, UA FEW (*) RARE  POCT I-STAT TROPONIN I      Result Value Range   Troponin i, poc 0.01  0.00 - 0.08 ng/mL   Comment 3            Dg Chest 2 View  03/15/2013  *RADIOLOGY REPORT*  Clinical Data: Chest pain, diabetes  CHEST - 2 VIEW  Comparison: 11/17/2009  Findings: Hyperinflation noted compatible with chronic COPD/emphysema.  Calcified granulomas present bilaterally, most concentrated in the right lower lobe.  Stable heart size and vascularity.  No superimposed edema, pneumonia, collapse, consolidation, effusion or pneumothorax.  No significant interval change.  Atherosclerosis of the aorta.  IMPRESSION: COPD/emphysema  Chronic granulomatous disease  Stable exam without superimposed acute process   Original  Report Authenticated By: Judie Petit. Shick, M.D.         1. Chest pain       MDM  Pt with left sided chest pain, now resolved. No cardiac hx. No prior work up for "as long as I can remember" per patient. Pt is diabetic, on insulin. Will admit for rule out. Pt is also noted to be bradycardic. ECG showing 1st degree AV block, sinus brady, RBBB. BP is elevated. Will admit for further work up and rule out. Spoke with triad. Will admit.   Filed Vitals:   03/15/13 1238 03/15/13 1315  BP: 169/63 178/51  Pulse: 55 51  Temp: 97.6 F (36.4 C)   TempSrc: Oral   Resp: 16 11  SpO2: 99% 99%           Jalina Blowers A Herve Haug, PA-C 03/15/13 1529

## 2013-03-15 NOTE — ED Provider Notes (Signed)
Medical screening examination/treatment/procedure(s) were conducted as a shared visit with non-physician practitioner(s) and myself.  I personally evaluated the patient during the encounter  Elderly patient with no known CAD, has had CP, resolved in the ED. EKG and Trop neg. Plan admit for eval. Discussed with patient and family.   Charles B. Bernette Mayers, MD 03/15/13 1537

## 2013-03-15 NOTE — ED Notes (Signed)
RN at the bedside 

## 2013-03-15 NOTE — ED Notes (Signed)
Family at bedside.SON AND DAUGHTER IN LAW

## 2013-03-15 NOTE — Consult Note (Signed)
CARDIOLOGY CONSULT NOTE  Patient ID: [name], MRN: [id number], DOB/AGE: September 04, 1915 79 y.o. Admit date: 03/15/2013   Date of Consult: 03/15/2013 Primary Physician: Reather Littler, MD Primary Cardiologist: New to [facility]  Chief Complaint: chest pain Reason for Consult:CP, bradycardia  HPI: [name] is a very pleasant 79 y/o F who lives independently and still drives, with longstanding history of diabetes mellitus and hypothyroidsm. She presented to [facility] today with chest pain. Yesterday afternoon while watching TV she developed L arm discomfort and chest burning that felt like acid reflux. She had had a sandwich for lunch. The pain persisted constantly for several hours but did not worsen so she went to bed. The pain woke her up at 2:30am so she took 2 antacids - unsure if pain got better, but she was able to sleep. She noted she was sweaty. She could only sleep for an hour before she was awakened again with the pain so she took 2 more antacids and aspirin. The pain persisted so she waited until daylight to call her insurance company to find out what she should do. They advised that she call 911. By the time she did, her pain had started to subside. No SOB, orthopnea, PND, syncope, palpitations or dizziness. In the ER she was found to be bradycardic with HR in the 40s-50s with sinus rhythm but occasional variable prolonging PR, possibly Wenckebach. She is not on any AV nodal blocking agents.  EKG shows SB 55bpm with 1st degree AVB (fixed), RBBB, LAFB seen on prior tracing, with nonspecific changes. She currently denies complaints. Troponin neg x 1, LFTs normal, BUN/Cr mildly elevated, normal lipase. CXR suggestive of emphysema without acute process. She denies any other episodes like this. She does not exercise. She ambulates with a walker and denies any exertional symptoms.  Past Medical History  Diagnosis Date  . Diabetes mellitus without complication   . Tuberculosis     During the  Great Depression  . Hypothyroidism       Most Recent Cardiac Studies: None.   Surgical History:  Past Surgical History  Procedure Laterality Date  . Hip fracture surgery      both hips     Home Meds: Prior to Admission medications   Medication Sig Start Date End Date Taking? Authorizing Provider  aspirin EC 81 MG tablet Take 81 mg by mouth daily.   Yes Historical Provider, MD  Calcium Carbonate-Vitamin D (CALTRATE 600+D) 600-400 MG-UNIT per tablet Take 1 tablet by mouth daily.   Yes Historical Provider, MD  estazolam (PROSOM) 1 MG tablet Take 1 mg by mouth at bedtime.   Yes Historical Provider, MD  glimepiride (AMARYL) 2 MG tablet Take 2 mg by mouth daily before breakfast.   Yes Historical Provider, MD  insulin aspart (NOVOLOG FLEXPEN) 100 UNIT/ML injection Inject 3 Units into the skin 3 (three) times daily before meals. If blood sugar is over 200, take 5 units and if they go over 300, take 6 units at that meal.   Yes Historical Provider, MD  levothyroxine (SYNTHROID, LEVOTHROID) 88 MCG tablet Take 88 mcg by mouth daily before breakfast.   Yes Historical Provider, MD  pregabalin (LYRICA) 50 MG capsule Take 50 mg by mouth 2 (two) times daily.   Yes Historical Provider, MD  traMADol (ULTRAM) 50 MG tablet Take 50 mg by mouth 2 (two) times daily.   Yes Historical Provider, MD    Inpatient Medications:   Allergies:  Allergies  Allergen Reactions  .  Other     Ground red pepper lotion to rub on her knee caused SOB    History   Social History  . Marital Status: Widowed    Spouse Name: N/A    Number of Children: N/A  . Years of Education: N/A   Occupational History  . Not on file.   Social History Main Topics  . Smoking status: Never Smoker   . Smokeless tobacco: Not on file  . Alcohol Use: No  . Drug Use: No  . Sexually Active: Not on file   Other Topics Concern  . Not on file   Social History Narrative  . No narrative on file     Family History  Problem Relation  Age of Onset  . Diabetes Father      Review of Systems: General: negative for chills, fever, night sweats or weight changes.  Cardiovascular: see above. Occasional LEE Dermatological: negative for rash Respiratory: negative for cough or wheezing Urologic: negative for hematuria Abdominal: negative for nausea, vomiting, diarrhea, bright red blood per rectum, melena, or hematemesis. Occasional constipation. Neurologic: negative for visual changes, syncope, or dizziness All other systems reviewed and are otherwise negative except as noted above.  Labs: Troponin neg x 1 Lab Results  Component Value Date   WBC 8.2 03/15/2013   HGB 12.4 03/15/2013   HCT 35.6* 03/15/2013   MCV 91.5 03/15/2013   PLT 146* 03/15/2013     Recent Labs Lab 03/15/13 1318  NA 141  K 4.7  CL 107  CO2 26  BUN 30*  CREATININE 1.18*  CALCIUM 9.8  PROT 6.3  BILITOT 0.5  ALKPHOS 65  ALT 12  AST 19  GLUCOSE 205*   Radiology/Studies:  Dg Chest 2 View 03/15/2013  *RADIOLOGY REPORT*  Clinical Data: Chest pain, diabetes  CHEST - 2 VIEW  Comparison: 11/17/2009  Findings: Hyperinflation noted compatible with chronic COPD/emphysema.  Calcified granulomas present bilaterally, most concentrated in the right lower lobe.  Stable heart size and vascularity.  No superimposed edema, pneumonia, collapse, consolidation, effusion or pneumothorax.  No significant interval change.  Atherosclerosis of the aorta.  IMPRESSION: COPD/emphysema  Chronic granulomatous disease  Stable exam without superimposed acute process   Original Report Authenticated By: Judie Petit. Shick, M.D.    EKG: SB 55bpm with 1st degree AVB (fixed), RBBB, LAFB  Physical Exam: Blood pressure 193/69, pulse 49, temperature 97.3 F (36.3 C), temperature source Oral, resp. rate 16, height 5\' 5"  (1.651 m), weight 140 lb (63.504 kg), SpO2 95.00%. General: Well developed elderly WF in no acute distress. Head: Normocephalic, atraumatic, sclera non-icteric, no xanthomas, nares  are without discharge.  Neck: Possible L carotid bruit. JVD not elevated. Lungs: Clear bilaterally to auscultation without wheezes, rales, or rhonchi. Breathing is unlabored. Heart: Reg rhythm but bradycardic with S1 S2. No murmurs, rubs, or gallops appreciated. Abdomen: Soft, non-tender, non-distended with normoactive bowel sounds. No hepatomegaly. No rebound/guarding. No obvious abdominal masses. Msk:  Strength and tone appear normal for age. Extremities: No clubbing or cyanosis. No edema.  Distal pedal pulses are 2+ and equal bilaterally. Neuro: Alert and oriented X 3. No facial asymmetry. No focal deficit. Moves all extremities spontaneously. Psych:  Responds to questions appropriately with a normal affect.   Assessment and Plan:  1. Chest pain concerning for unstable angina - however, despite constant hours of pain, troponin is negative. Would favor conservative management in light of age and renal insufficiency. Would not proceed with stress testing as we would have to be  careful even with Lexiscan agent with her conduction disease. Continue ASA. Check lipids. Avoid beta blocker. Per discussion with Dr. Albertina Parr not heparinize at this time given risk of bleeding with her age. 2. Bradycardia/trifasicular block - appears to be Wenckebach at times versus SSS. Does not seem to be acutely correlated with any symptoms but follow for symptoms while on tele. Would recommend to check thyroid panel. Avoid AV nodal blocking agents. Do not see an acute indication for pacemaker, but her trifasicular block certainly puts her at risk for more significant bradycardias in the future at which point pacemaker implantation may need to be addressed. She is mentally intact and still fairly active in spite of her age. 3. Diabetes mellitus - on SSI per IM. 4. Renal insufficiency, chronicity unknown - will need to be followed.  5. Carotid bruit - asymptomatic. Would follow clinically. 6. Hypothyroidism - agree  with TSH, free T4.  Signed, Ronie Spies PA-C 03/15/2013, 5:03 PM  Patient examined chart reviewed. Atypical pain prolonged with negative troponin No history of CAD.  Patient is very functional but DNR.  If no recurrent pain and echo normal would not pursue further. Her advanced conduction disease really precludes lexiscan myovue.  Trifasicular block and wenkeback seen in July.  No lightheadedness, palpitations or syncope.  Discussed need to avoid AV nodal blocking drugs with son and patient.  Son [name] is a patient of mine and known well.  Ok to discharge with f/u Dr Lucianne Muss if echo normal and no recurrent pain.  Charlton Haws

## 2013-03-15 NOTE — ED Notes (Signed)
Pt taken to floor via Jacques Earthly, EMT

## 2013-03-15 NOTE — ED Notes (Signed)
Report given to floor nurse, Chasidy, RN; Nurse has no further questions upon report given; pt being prepared for transport to floor.

## 2013-03-15 NOTE — ED Notes (Signed)
Pt denies chest pain; pt alert and mentating appropriately

## 2013-03-15 NOTE — ED Notes (Signed)
Internal Medicine at bedside.  

## 2013-03-15 NOTE — ED Notes (Signed)
Pt wheeled to the bathroom pt did well

## 2013-03-16 DIAGNOSIS — R079 Chest pain, unspecified: Secondary | ICD-10-CM

## 2013-03-16 DIAGNOSIS — I517 Cardiomegaly: Secondary | ICD-10-CM

## 2013-03-16 DIAGNOSIS — I10 Essential (primary) hypertension: Secondary | ICD-10-CM

## 2013-03-16 LAB — COMPREHENSIVE METABOLIC PANEL
Albumin: 3.2 g/dL — ABNORMAL LOW (ref 3.5–5.2)
BUN: 29 mg/dL — ABNORMAL HIGH (ref 6–23)
Chloride: 106 mEq/L (ref 96–112)
Creatinine, Ser: 1.2 mg/dL — ABNORMAL HIGH (ref 0.50–1.10)
GFR calc non Af Amer: 37 mL/min — ABNORMAL LOW (ref >90)
Total Bilirubin: 0.5 mg/dL (ref 0.3–1.2)

## 2013-03-16 LAB — GLUCOSE, CAPILLARY: Glucose-Capillary: 139 mg/dL — ABNORMAL HIGH (ref 70–99)

## 2013-03-16 LAB — CBC
MCV: 91.2 fL (ref 78.0–100.0)
Platelets: 141 10*3/uL — ABNORMAL LOW (ref 150–400)
RBC: 3.62 MIL/uL — ABNORMAL LOW (ref 3.87–5.11)
WBC: 7.1 10*3/uL (ref 4.0–10.5)

## 2013-03-16 LAB — LIPID PANEL
HDL: 64 mg/dL (ref >39)
Triglycerides: 50 mg/dL (ref <150)

## 2013-03-16 MED ORDER — OMEPRAZOLE 20 MG PO CPDR: 20.0000 mg | DELAYED_RELEASE_CAPSULE | Freq: Every day | ORAL | Status: DC

## 2013-03-16 MED ORDER — AMLODIPINE BESYLATE 5 MG PO TABS: 5.0000 mg | ORAL_TABLET | Freq: Every day | ORAL | Status: DC

## 2013-03-16 MED ORDER — AMLODIPINE BESYLATE 5 MG PO TABS
5.0000 mg | ORAL_TABLET | Freq: Every day | ORAL | Status: DC
  Administered 2013-03-16: 5 mg via ORAL
  Filled 2013-03-16: qty 1

## 2013-03-16 NOTE — Progress Notes (Signed)
Patient: [name] Date of Encounter: 03/16/2013, 9:31 AM Admit date: 03/15/2013     Subjective  Ms. Kratzke reports her CP has resolved. She feels back to her usual self. She denies SOB, palpitations, dizziness or syncope.   Objective  Physical Exam: Vitals: BP 174/63  Pulse 62  Temp(Src) 97.7 F (36.5 C) (Oral)  Resp 18  Ht 5\' 5"  (1.651 m)  Wt 140 lb (63.504 kg)  BMI 23.3 kg/m2  SpO2 95% General: Well developed, well appearing, elderly 79 year old female in no acute distress. Neck: Supple. JVD not elevated. Lungs: Clear bilaterally to auscultation without wheezes, rales, or rhonchi. Breathing is unlabored. Heart: RRR S1 S2 without murmurs, rubs, or gallops.  Abdomen: Soft, non-distended. Extremities: No clubbing or cyanosis. No edema.  Distal pedal pulses are 2+ and equal bilaterally. Neuro: Alert and oriented X 3. Moves all extremities spontaneously. No focal deficits.  Intake/Output:  Intake/Output Summary (Last 24 hours) at 03/16/13 0931 Last data filed at 03/15/13 1700  Gross per 24 hour  Intake    240 ml  Output      0 ml  Net    240 ml    Inpatient Medications:  . aspirin EC  81 mg Oral Daily  . heparin  5,000 Units Subcutaneous Q8H  . insulin aspart  0-9 Units Subcutaneous TID WC  . levothyroxine  88 mcg Oral QAC breakfast  . pantoprazole  40 mg Oral Q1200  . pregabalin  50 mg Oral BID  . sodium chloride  3 mL Intravenous Q12H   . sodium chloride      Labs:  Recent Labs  03/15/13 1318 03/15/13 1659 03/16/13 0450  NA 141  --  141  K 4.7  --  3.8  CL 107  --  106  CO2 26  --  25  GLUCOSE 205*  --  138*  BUN 30*  --  29*  CREATININE 1.18*  --  1.20*  CALCIUM 9.8  --  9.3  MG  --  2.1  --     Recent Labs  03/15/13 1318 03/16/13 0450  AST 19 18  ALT 12 11  ALKPHOS 65 57  BILITOT 0.5 0.5  PROT 6.3 5.7*  ALBUMIN 3.4* 3.2*    Recent Labs  03/15/13 1318  LIPASE 16    Recent Labs  03/15/13 1318 03/16/13 0450  WBC 8.2  7.1  NEUTROABS 5.0  --   HGB 12.4 11.3*  HCT 35.6* 33.0*  MCV 91.5 91.2  PLT 146* 141*    Recent Labs  03/15/13 1659 03/15/13 2231 03/16/13 0450  TROPONINI <0.30 <0.30 <0.30    Recent Labs  03/15/13 1659  HGBA1C 7.6*    Recent Labs  03/16/13 0450  CHOL 155  HDL 64  LDLCALC 81  TRIG 50  CHOLHDL 2.4    Recent Labs  03/15/13 1659  TSH 0.712    Radiology/Studies: Dg Chest 2 View  03/15/2013  *RADIOLOGY REPORT*  Clinical Data: Chest pain, diabetes  CHEST - 2 VIEW  Comparison: 11/17/2009  Findings: Hyperinflation noted compatible with chronic COPD/emphysema.  Calcified granulomas present bilaterally, most concentrated in the right lower lobe.  Stable heart size and vascularity.  No superimposed edema, pneumonia, collapse, consolidation, effusion or pneumothorax.  No significant interval change.  Atherosclerosis of the aorta.  IMPRESSION: COPD/emphysema  Chronic granulomatous disease  Stable exam without superimposed acute process   Original Report Authenticated By: Judie Petit. Miles Costain, M.D.     Echocardiogram:  pending Telemetry: sinus rhythm with intermittent second degree AV block, Mobitz I/Wenckebach; no high grade AV block or pauses    Assessment and Plan  1. Chest pain - Despite constant pain for several hours, troponin is negative. Would favor conservative management in light of age and renal insufficiency. Would not proceed with stress testing as we would have to be careful even with Lexiscan agent with her conduction disease. Continue ASA. Check lipids. Avoid beta blocker. Per discussion with Dr. Eden Emms, would not heparinize at this time given risk of bleeding with her advanced age.  2. Bradycardia/trifasicular block/Wenckebach - Does not seem to be acutely correlated with any symptoms. Avoid AV nodal blocking agents. No acute indication for pacemaker, but her trifasicular block certainly puts her at risk for more significant bradycardias in the future at which point pacemaker  implantation may need to be addressed. She is mentally intact and still fairly active in spite of her age.  3. Diabetes mellitus - on SSI per IM 4. Renal insufficiency, chronicity unknown 5. Hypothyroidism   Per Dr. Eden Emms - Atypical pain prolonged with negative troponin. No history of CAD. Patient is very functional but DNR. If no recurrent pain and echo normal would not pursue further. Her advanced conduction disease really precludes lexiscan myovue. Trifasicular block and Wenckebach, not new, seen in July. No lightheadedness, palpitations or syncope. Discussed need to avoid AV nodal blocking drugs with son and patient. Son Vonna Kotyk is a patient of Dr. Fabio Bering and known well. Ok to discharge with f/u Dr Lucianne Muss if echo normal and no recurrent pain.  Signed, Laylana Gerwig PA-C

## 2013-03-16 NOTE — Progress Notes (Signed)
PT Cancellation Note  Patient Details Name: KLOEE BALLEW MRN: [id number] DOB: 22-Nov-1915   Cancelled Treatment:    Reason Eval/Treat Not Completed: Other (comment) (Order to start 3/18 at 1603.  Will f/u tomorrow.  )   Sunny Schlein, [place] [phone] 03/16/2013, 9:11 AM

## 2013-03-16 NOTE — Progress Notes (Signed)
  Echocardiogram 2D Echocardiogram has been performed.  Marisal Swarey 03/16/2013, 11:06 AM

## 2013-03-16 NOTE — Evaluation (Signed)
Physical Therapy Evaluation Patient Details Name: [name] MRN: [id number] DOB: 09-16-1915 Today's Date: 03/16/2013 Time: 1000-1027 PT Time Calculation (min): 27 min  PT Assessment / Plan / Recommendation Clinical Impression  pt presentgs with CP.  pt is very independent 79 y/o woman who will need HHPT at D/C for home safety eval.  pt notes having an Alert necklace that she wears all the time.  Encouraged pt to have family check on her more often when she D/Cs home.      PT Assessment  Patient needs continued PT services    Follow Up Recommendations  Home health PT;Supervision - Intermittent    Does the patient have the potential to tolerate intense rehabilitation      Barriers to Discharge None      Equipment Recommendations  None recommended by PT    Recommendations for Other Services OT consult   Frequency Min 3X/week    Precautions / Restrictions Precautions Precautions: Fall Restrictions Weight Bearing Restrictions: No   Pertinent Vitals/Pain Denies pain.      Mobility  Bed Mobility Bed Mobility: Supine to Sit;Sitting - Scoot to Edge of Bed;Sit to Supine Supine to Sit: 6: Modified independent (Device/Increase time) Sitting - Scoot to Edge of Bed: 6: Modified independent (Device/Increase time) Sit to Supine: 6: Modified independent (Device/Increase time) Details for Bed Mobility Assistance: pt moves slowly, but able to complete without A.   Transfers Transfers: Sit to Stand;Stand to Sit Sit to Stand: 6: Modified independent (Device/Increase time);With upper extremity assist;From bed Stand to Sit: 6: Modified independent (Device/Increase time);With upper extremity assist;To bed Details for Transfer Assistance: Again moving slowly.   Ambulation/Gait Ambulation/Gait Assistance: 5: Supervision Ambulation Distance (Feet): 180 Feet Assistive device: Rolling walker Ambulation/Gait Assistance Details: pt moves slowly, but demos good use of RW.  Remains flexed,  but stays close to RW.   Gait Pattern: Step-through pattern;Decreased stride length;Trunk flexed Stairs: No Wheelchair Mobility Wheelchair Mobility: No    Exercises     PT Diagnosis: Difficulty walking (Decondition/Debility)  PT Problem List: Decreased activity tolerance;Decreased balance;Decreased mobility;Decreased knowledge of use of DME PT Treatment Interventions: DME instruction;Gait training;Stair training;Functional mobility training;Therapeutic activities;Therapeutic exercise;Balance training;Patient/family education   PT Goals Acute Rehab PT Goals PT Goal Formulation: With patient Time For Goal Achievement: 03/23/13 Potential to Achieve Goals: Good Pt will Ambulate: >150 feet;with modified independence;with rolling walker PT Goal: Ambulate - Progress: Goal set today Pt will Go Up / Down Stairs: 1-2 stairs;with modified independence;with rolling walker PT Goal: Up/Down Stairs - Progress: Goal set today  Visit Information  Last PT Received On: 03/16/13 Assistance Needed: +1 Reason Eval/Treat Not Completed: Other (comment) (Order to start 3/18 at 1603.  Will f/u tomorrow.  )    Subjective Data  Subjective: My son thinks I should come live with him.   Patient Stated Goal: Home   Prior Functioning  Home Living Lives With: Alone Available Help at Discharge: Family;Available PRN/intermittently Type of Home: House Home Access: Stairs to enter [employer] of Steps: 1 Entrance Stairs-Rails: None Home Layout: One level Home Adaptive Equipment: Walker - rolling Additional Comments: pt has Financial controller.   Prior Function Level of Independence: Needs assistance Needs Assistance: Light Housekeeping Light Housekeeping: Moderate Able to Take Stairs?: Yes Driving: Yes Vocation: Retired Musician: No difficulties    Copywriter, advertising Overall Cognitive Status: Appears within functional limits for tasks  assessed/performed Arousal/Alertness: Awake/alert Orientation Level: Appears intact for tasks assessed Behavior During Session: [facility] for tasks performed  Extremity/Trunk Assessment Right Lower Extremity Assessment RLE ROM/Strength/Tone: WFL for tasks assessed RLE Sensation: WFL - Light Touch Left Lower Extremity Assessment LLE ROM/Strength/Tone: WFL for tasks assessed LLE Sensation: WFL - Light Touch   Balance Balance Balance Assessed: No  End of Session PT - End of Session Equipment Utilized During Treatment: Gait belt Activity Tolerance: Patient tolerated treatment well Patient left: in bed;with call bell/phone within reach Nurse Communication: Mobility status  GP Functional Assessment Tool Used: Clinical Judgement Functional Limitation: Mobility: Walking and moving around Mobility: Walking and Moving Around Current Status (Z6109): At least 1 percent but less than 20 percent impaired, limited or restricted Mobility: Walking and Moving Around Goal Status [id number]): 0 percent impaired, limited or restricted   Sunny Schlein, [place] [phone] 03/16/2013, 10:47 AM

## 2013-03-16 NOTE — Discharge Summary (Signed)
[facility]  Physician Discharge Summary   Patient ID: [name] MRN: [id number] DOB/AGE: Nov 18, 1915 79 y.o.  Admit date: 03/15/2013 Discharge date: 03/16/2013  PCP: Reather Littler, MD  DISCHARGE DIAGNOSES:  Principal Problem:   Chest pain at rest Active Problems:   Bradycardia   Hypothyroidism   DM type 2 (diabetes mellitus, type 2)   Diabetic neuropathy   Generalized weakness   RECOMMENDATIONS FOR OUTPATIENT FOLLOW UP: 1. Trial of PPI 2. Home health has been set up.  DISCHARGE CONDITION: fair  Diet recommendation: Mod Carb  Filed Weights   03/15/13 1657  Weight: 63.504 kg (140 lb)    INITIAL HISTORY: [name] is a 79 y.o. female with a past medical history of for type 2 diabetes, hypothyroidism, diabetic neuropathy who was in her usual state of health the night before admission when she stated that she didn't feel well. Has been feeling weak for the past few months. She had a light dinner. And, then after dinner she had some burning sensation in the left side of her chest. She chewed couple of antacids. She went to bed at about 11 PM. She woke up at 2:30 in the morning with burning sensation in the left chest. She again took 2 antacids and then subsequently took aspirin. She stayed up most of the night. The discomfort was 7/10 in intensity. It radiated under her left armpit. Denied any nausea. No fever or chills. Did feel nervous and had palpitations (felt as if heart was racing at times). Denied any shortness of breath. No dizziness, per se. She admitted to periodic foot swelling once in a while. She says, that she had similar symptoms many, many years ago, but nothing recently. She got concerned by her symptoms. She called her son. And they called the patient's primary care physician's office who recommended calling 911. Patient was brought into the hospital. A little while after coming to the ED, her pain resolved. However, she still feels palpitations. Denied  any changes to her medications recently. Had been feeling weak as mentioned earlier over the last few months without any discernible etiology.  Consultations:  LB Cards  Procedures: 2 D ECHO 3/18 Study Conclusions  - Left ventricle: The cavity size was normal. Wall thickness was increased in a pattern of mild LVH. Systolic function was normal. The estimated ejection fraction was in the range of 55% to 60%. Although no diagnostic regional wall motion abnormality was identified, this possibility cannot be completely excluded on the basis of this study. Doppler parameters are consistent with abnormal left ventricular relaxation (grade 1 diastolic dysfunction). - Aortic valve: There was no stenosis. Trivial regurgitation. - Mitral valve: Mildly calcified annulus. Trivial regurgitation. - Left atrium: The atrium was mildly to moderately dilated. - Right ventricle: The cavity size was normal. Systolic function was normal. - Tricuspid valve: Peak RV-RA gradient:16mm Hg (S). - Pulmonary arteries: PA peak pressure: 27mm Hg (S). - Inferior vena cava: The vessel was normal in size; the respirophasic diameter changes were in the normal range (= 50%); findings are consistent with normal central venous pressure.  Impressions: - Normal LV size and systolic function, EF 55-60%. Mild LV hypertrophy. Normal RV size and systolic function. No significant valvular dysfunction.   HOSPITAL COURSE:   Burning sensation in the chest  Her symptoms have resolved. She has ruled out for ACS. Appreciate cards input. ECHO does not show any significant abnormalities. Trial of PPI at home. She will follow up with PCP.  Significant bradycardia  with first degree AV block/Occasional Wenckebach as well Seen by cardiology and they are not concerned as this chronic. They do not feel she is a candidate for pacemaker. Thyroid function tests were normal. She remains stable and asymptomatic.  History of hypothyroidism  Continue  with Synthroid.   Hypertension BP running high. She is not on any anti-hypertensives. No AV blocking agents due to bradycardia. Started Amlodipine. She will follow up with PCP.  History of type 2 diabetes  Continue with home medications. HbA1c is 7.6   History of diabetic neuropathy  She is on Lyrica.   Chronic kidney disease  Appears to be at baseline.  Abnormal UA  She is asymptomatic. Not very impressive for being urinary tract infection. Holding off on antibiotics.   She is stable for discharge. She will follow up with Dr. Lucianne Muss. Discussed with patient and her daughter in law. PT recommended home health which will be setup.   PERTINENT LABS:  The results of significant diagnostics from this hospitalization (including imaging, microbiology, ancillary and laboratory) are listed below for reference.     Labs: Basic Metabolic Panel:  Recent Labs Lab 03/15/13 1318 03/15/13 1659 03/16/13 0450  NA 141  --  141  K 4.7  --  3.8  CL 107  --  106  CO2 26  --  25  GLUCOSE 205*  --  138*  BUN 30*  --  29*  CREATININE 1.18*  --  1.20*  CALCIUM 9.8  --  9.3  MG  --  2.1  --    Liver Function Tests:  Recent Labs Lab 03/15/13 1318 03/16/13 0450  AST 19 18  ALT 12 11  ALKPHOS 65 57  BILITOT 0.5 0.5  PROT 6.3 5.7*  ALBUMIN 3.4* 3.2*    Recent Labs Lab 03/15/13 1318  LIPASE 16   CBC:  Recent Labs Lab 03/15/13 1318 03/16/13 0450  WBC 8.2 7.1  NEUTROABS 5.0  --   HGB 12.4 11.3*  HCT 35.6* 33.0*  MCV 91.5 91.2  PLT 146* 141*   Cardiac Enzymes:  Recent Labs Lab 03/15/13 1659 03/15/13 2231 03/16/13 0450  TROPONINI <0.30 <0.30 <0.30   CBG:  Recent Labs Lab 03/15/13 1656 03/15/13 2004 03/16/13 0744 03/16/13 1142 03/16/13 1646  GLUCAP 225* 179* 153* 168* 139*     IMAGING STUDIES Dg Chest 2 View  03/15/2013  *RADIOLOGY REPORT*  Clinical Data: Chest pain, diabetes  CHEST - 2 VIEW  Comparison: 11/17/2009  Findings: Hyperinflation noted  compatible with chronic COPD/emphysema.  Calcified granulomas present bilaterally, most concentrated in the right lower lobe.  Stable heart size and vascularity.  No superimposed edema, pneumonia, collapse, consolidation, effusion or pneumothorax.  No significant interval change.  Atherosclerosis of the aorta.  IMPRESSION: COPD/emphysema  Chronic granulomatous disease  Stable exam without superimposed acute process   Original Report Authenticated By: Judie Petit. Miles Costain, M.D.     DISCHARGE EXAMINATION: See progress note from earlier today.  DISPOSITION: Home with family. Home health.  Discharge Orders   Future Orders Complete By Expires     Diet Carb Modified  As directed     Discharge instructions  As directed     Comments:      Please follow up with PCP.    Increase activity slowly  As directed       Current Discharge Medication List    START taking these medications   Details  amLODipine (NORVASC) 5 MG tablet Take 1 tablet (5 mg total) by mouth  daily. Qty: 30 tablet, Refills: 0    omeprazole (PRILOSEC) 20 MG capsule Take 1 capsule (20 mg total) by mouth daily. Qty: 30 capsule, Refills: 0      CONTINUE these medications which have NOT CHANGED   Details  aspirin EC 81 MG tablet Take 81 mg by mouth daily.    Calcium Carbonate-Vitamin D (CALTRATE 600+D) 600-400 MG-UNIT per tablet Take 1 tablet by mouth daily.    estazolam (PROSOM) 1 MG tablet Take 1 mg by mouth at bedtime.    glimepiride (AMARYL) 2 MG tablet Take 2 mg by mouth daily before breakfast.    insulin aspart (NOVOLOG FLEXPEN) 100 UNIT/ML injection Inject 3 Units into the skin 3 (three) times daily before meals. If blood sugar is over 200, take 5 units and if they go over 300, take 6 units at that meal.    levothyroxine (SYNTHROID, LEVOTHROID) 88 MCG tablet Take 88 mcg by mouth daily before breakfast.    pregabalin (LYRICA) 50 MG capsule Take 50 mg by mouth 2 (two) times daily.    traMADol (ULTRAM) 50 MG tablet Take 50 mg by  mouth 2 (two) times daily.       Follow-up Information   Follow up with [facility], MD. Schedule an appointment as soon as possible for a visit in 1 week. (post hospitalization follow up and BP follow up.)    Contact information:   [address] [facility] [place] [address] [phone]       Call Charlton Haws, MD. (As needed)    Contact information:   [address] [place]lyn [place] [address] [phone]       TOTAL DISCHARGE TIME: 35 mins  [facility]  [facility] Pager [phone]  03/16/2013, 5:08 PM

## 2013-03-16 NOTE — Progress Notes (Signed)
Pt had a 2.04 s pause. HR also in the 40-50s. Pt asymptomatic and sleeping. MD on call notified. Will continue to monitor the pt. Sanda Linger

## 2013-03-16 NOTE — Care Management Note (Addendum)
    Page 1 of 2   03/17/2013     2:30:40 PM   CARE MANAGEMENT NOTE 03/17/2013  Patient:  Bonnie, Dominguez   Account Number:  [id number]  Date Initiated:  03/16/2013  Documentation initiated by:  GRAVES-BIGELOW,Adrianna Dudas  Subjective/Objective Assessment:   Pt admitted with cp. PT to evaluate before disposition needs.     Action/Plan:   CM will continue to monitor for needs.   Anticipated DC Date:  03/16/2013   Anticipated DC Plan:  HOME W HOME HEALTH SERVICES      DC Planning Services  CM consult      [facility] Choice  HOME HEALTH   Choice offered to / List presented to:  C-1 Patient        HH arranged  HH-2 PT  HH-1 RN  HH-10 DISEASE MANAGEMENT      Status of service:  Completed, signed off Medicare Important Message given?   (If response is "NO", the following Medicare IM given date fields will be blank) Date Medicare IM given:   Date Additional Medicare IM given:    Discharge Disposition:  HOME W HOME HEALTH SERVICES  Per UR Regulation:  Reviewed for med. necessity/level of care/duration of stay  If discussed at Long Length of Stay Meetings, dates discussed:    Comments:  03-17-13 1224 Tomi Bamberger, RN,BSN [phone] CM did speak to son and he is agreeable to [facility] services listed above. CM made referral for services and SOC to begin within the next 24-48 hours post d/c.   03-17-13 1001 Tomi Bamberger, RN,BSN [phone] CM spoke to family on yesterday and they could not make a decision about [facility] services. CM will call pt today and set up hh services. CM did call pt and son- Left vm with son to call me back. Cm will continue to monitor to set up services.    03-16-13 1644 Tomi Bamberger, [place] [phone] CM did speak to pt and she will need some time to think about [facility] services: If pt is d/c tonight CM will call in the am to see who she wants to use. She wanted to ask a friend about the agency she uses. [phone]

## 2013-03-16 NOTE — Progress Notes (Signed)
Pt provided with dc instructions and education. Pt verbalized understanding. Pt has no questions that this time. VSS. IV removed with tip intact. Heart monitor cleaned and returned to front. Pt leaving for home with family. Pt aware that Steward Drone, Sports coach, will call her tomorrow to set up [facility]. Levonne Spiller, Rn

## 2013-03-16 NOTE — Progress Notes (Signed)
UR Completed Tamarius Rosenfield Graves-Bigelow, RN,BSN [phone]  

## 2013-03-16 NOTE — Progress Notes (Signed)
[facility] PROGRESS NOTE  [name] ZOX:[id number] DOB: [birth year] DOA: 03/15/2013  PCP: Reather Littler, MD  Brief HPI: [name] is a 79 y.o. female with a past medical history of for type 2 diabetes, hypothyroidism, diabetic neuropathy who was in her usual state of health the night before admission when she stated that she didn't feel well. Has been feeling weak for the past few months. She had a light dinner. And, then after dinner she had some burning sensation in the left side of her chest. She chewed couple of antacids. She went to bed at about 11 PM. She woke up at 2:30 in the morning with burning sensation in the left chest. She again took 2 antacids and then subsequently took aspirin. She stayed up most of the night. The discomfort was 7/10 in intensity. It radiated under her left armpit. Denied any nausea. No fever or chills. Did feel nervous and had palpitations (felt as if heart was racing at times). Denied any shortness of breath. No dizziness, per se. She admitted to periodic foot swelling once in a while. She says, that she had similar symptoms many, many years ago, but nothing recently. She got concerned by her symptoms. She called her son. And they called the patient's primary care physician's office who recommended calling 911. Patient was brought into the hospital. A little while after coming to the ED, her pain resolved. However, she still feels palpitations. Denied any changes to her medications recently. Had been feeling weak as mentioned earlier over the last few months without any discernible etiology.  Past medical history:  Past Medical History  Diagnosis Date  . Diabetes mellitus without complication   . Tuberculosis     During the Great Depression  . Hypothyroidism    Consultants:  LB Cards  Procedures: None  Antibiotics: None  Subjective: Patient feels well. Denies any complaints.No chest pain. Ambulated.  Objective: Vital Signs  Filed Vitals:    03/15/13 2104 03/15/13 2107 03/16/13 0500 03/16/13 1400  BP: 163/56 161/61 174/63 145/56  Pulse: 74 75 62 57  Temp:   97.7 F (36.5 C) 98.4 F (36.9 C)  TempSrc:    Oral  Resp:   18 18  Height:      Weight:      SpO2:   95% 94%    Intake/Output Summary (Last 24 hours) at 03/16/13 1552 Last data filed at 03/15/13 1700  Gross per 24 hour  Intake    240 ml  Output      0 ml  Net    240 ml   Filed Weights   03/15/13 1657  Weight: 63.504 kg (140 lb)    General appearance: alert, cooperative, appears stated age and no distress Resp: clear to auscultation bilaterally Cardio: regular rate and rhythm, S1, S2 normal, no murmur, click, rub or gallop GI: soft, non-tender; bowel sounds normal; no masses,  no organomegaly Extremities: extremities normal, atraumatic, no cyanosis or edema Neurologic: Alert and oriented x 3. No focal deficits.  Lab Results:  Basic Metabolic Panel:  Recent Labs Lab 03/15/13 1318 03/15/13 1659 03/16/13 0450  NA 141  --  141  K 4.7  --  3.8  CL 107  --  106  CO2 26  --  25  GLUCOSE 205*  --  138*  BUN 30*  --  29*  CREATININE 1.18*  --  1.20*  CALCIUM 9.8  --  9.3  MG  --  2.1  --  Liver Function Tests:  Recent Labs Lab 03/15/13 1318 03/16/13 0450  AST 19 18  ALT 12 11  ALKPHOS 65 57  BILITOT 0.5 0.5  PROT 6.3 5.7*  ALBUMIN 3.4* 3.2*    Recent Labs Lab 03/15/13 1318  LIPASE 16   CBC:  Recent Labs Lab 03/15/13 1318 03/16/13 0450  WBC 8.2 7.1  NEUTROABS 5.0  --   HGB 12.4 11.3*  HCT 35.6* 33.0*  MCV 91.5 91.2  PLT 146* 141*   Cardiac Enzymes:  Recent Labs Lab 03/15/13 1659 03/15/13 2231 03/16/13 0450  TROPONINI <0.30 <0.30 <0.30   BNP (last 3 results) No results found for this basename: PROBNP,  in the last 8760 hours CBG:  Recent Labs Lab 03/15/13 1656 03/15/13 2004 03/16/13 0744 03/16/13 1142  GLUCAP 225* 179* 153* 168*    No results found for this or any previous visit (from the past 240  hour(s)).    Studies/Results: Dg Chest 2 View  03/15/2013  *RADIOLOGY REPORT*  Clinical Data: Chest pain, diabetes  CHEST - 2 VIEW  Comparison: 11/17/2009  Findings: Hyperinflation noted compatible with chronic COPD/emphysema.  Calcified granulomas present bilaterally, most concentrated in the right lower lobe.  Stable heart size and vascularity.  No superimposed edema, pneumonia, collapse, consolidation, effusion or pneumothorax.  No significant interval change.  Atherosclerosis of the aorta.  IMPRESSION: COPD/emphysema  Chronic granulomatous disease  Stable exam without superimposed acute process   Original Report Authenticated By: Judie Petit. Miles Costain, M.D.     Medications:  Scheduled: . amLODipine  5 mg Oral Daily  . aspirin EC  81 mg Oral Daily  . heparin  5,000 Units Subcutaneous Q8H  . insulin aspart  0-9 Units Subcutaneous TID WC  . levothyroxine  88 mcg Oral QAC breakfast  . pantoprazole  40 mg Oral Q1200  . pregabalin  50 mg Oral BID  . sodium chloride  3 mL Intravenous Q12H   Continuous: . sodium chloride     ZOX:[id number], acetaminophen, albuterol, hydrALAZINE, ondansetron (ZOFRAN) IV, ondansetron, zolpidem  Assessment/Plan:  Principal Problem:   Chest pain at rest Active Problems:   Bradycardia   Hypothyroidism   DM type 2 (diabetes mellitus, type 2)   Diabetic neuropathy   Generalized weakness    Burning sensation in the chest She has ruled out for ACS. Appreciate cards input. Await ECHO.   Significant bradycardia with first degree AV block Concerning for sick sinus syndrome. Cardiology thinks its Wenckebach which is old apparently. No pacemaker. TSh and FT4 is normal.   History of hypothyroidism Continue with Synthroid.   History of type 2 diabetes Placed on sliding scale. Holding her glipizide for now. HbA1c is 7.6  History of diabetic neuropathy She is on Lyrica.   Chronic kidney disease Appears to be at baseline. Continue to monitor.   Abnormal UA Not  very impressive for being urinary tract infection. Holding off on antibiotics.   DVT Prophylaxis: . Subcutaneous heparin  Code Status: DO NOT RESUSCITATE  Family Communication: Discussed with the patient and her family at bedside. Disposition Plan: Await ECHO report. Possible discharge later today.    LOS: 1 day   [facility]  [facility] Pager [phone] 03/16/2013, 3:52 PM  If 8PM-8AM, please contact night-coverage at [email or web address], password [facility]

## 2013-03-25 ENCOUNTER — Encounter: Payer: Self-pay | Admitting: *Deleted

## 2013-03-25 ENCOUNTER — Encounter: Payer: Self-pay | Admitting: Cardiovascular Disease

## 2013-03-26 ENCOUNTER — Encounter: Payer: Self-pay | Admitting: Cardiovascular Disease

## 2013-03-26 ENCOUNTER — Ambulatory Visit (INDEPENDENT_AMBULATORY_CARE_PROVIDER_SITE_OTHER): Payer: Medicare HMO | Admitting: Cardiovascular Disease

## 2013-03-26 VITALS — BP 139/60 | HR 67 | Ht 65.0 in | Wt 129.0 lb

## 2013-03-26 DIAGNOSIS — E119 Type 2 diabetes mellitus without complications: Secondary | ICD-10-CM

## 2013-03-26 DIAGNOSIS — R079 Chest pain, unspecified: Secondary | ICD-10-CM

## 2013-03-26 DIAGNOSIS — R001 Bradycardia, unspecified: Secondary | ICD-10-CM

## 2013-03-26 DIAGNOSIS — I498 Other specified cardiac arrhythmias: Secondary | ICD-10-CM

## 2013-03-26 NOTE — Assessment & Plan Note (Signed)
Aysmptomatic Avoid AV nodal drugs no high grade AV block Follow No indication for pacer

## 2013-03-26 NOTE — Patient Instructions (Addendum)
Your physician wants you to follow-up in:  6 MONTHS WITH DR NISHAN  You will receive a reminder letter in the mail two months in advance. If you don't receive a letter, please call our office to schedule the follow-up appointment. Your physician recommends that you continue on your current medications as directed. Please refer to the Current Medication list given to you today. 

## 2013-03-26 NOTE — Assessment & Plan Note (Signed)
Discussed low carb diet.  Target hemoglobin A1c is 6.5 or less.  Continue current medications.  

## 2013-03-26 NOTE — Progress Notes (Signed)
Patient ID: [name], female   DOB: December 12, 1915, 79 y.o.   MRN: [id number] [name] is a very pleasant 79 y/o F who lives independently and still drives, with longstanding history of diabetes mellitus and hypothyroidsm. She presented to [facility] 3/17  with chest pain. She developed L arm discomfort and chest burning that felt like acid reflux. She had had a sandwich for lunch. The pain persisted constantly for several hours but did not worsen so she went to bed. The pain woke her up at 2:30am so she took 2 antacids - unsure if pain got better, but she was able to sleep. She noted she was sweaty. She could only sleep for an hour before she was awakened again with the pain so she took 2 more antacids and aspirin. The pain persisted so she waited until daylight to call her insurance company to find out what she should do. They advised that she call 911. By the time she did, her pain had started to subside. No SOB, orthopnea, PND, syncope, palpitations or dizziness. In the ER she was found to be bradycardic with HR in the 40s-50s with sinus rhythm but occasional variable prolonging PR, possibly Wenckebach. She is not on any AV nodal blocking agents.   EKG showed SB 55bpm with 1st degree AVB (fixed), RBBB, LAFB seen on prior tracing, with nonspecific changes. She currently denies complaints. Troponin neg x 1, LFTs normal, BUN/Cr mildly elevated, normal lipase. CXR suggestive of emphysema without acute process. She denies any other episodes like this. She does not exercise. She ambulates with a walker and denies any exertional symptoms.  Symptoms resolved with GERD Rx.  R/O and echo was normal. Telemetry showed no high grade AV block or symptomatic block and she was discharged  ROS: Denies fever, malais, weight loss, blurry vision, decreased visual acuity, cough, sputum, SOB, hemoptysis, pleuritic pain, palpitaitons, heartburn, abdominal pain, melena, lower extremity edema, claudication, or rash.   All other systems reviewed and negative  General: Affect appropriate Healthy:  appears stated age HEENT: normal Neck supple with no adenopathy JVP normal no bruits no thyromegaly Lungs clear with no wheezing and good diaphragmatic motion Heart:  S1/S2 no murmur, no rub, gallop or click PMI normal Abdomen: benighn, BS positve, no tenderness, no AAA no bruit.  No HSM or HJR Distal pulses intact with no bruits No edema Neuro non-focal Skin warm and dry No muscular weakness   Current Outpatient Prescriptions  Medication Sig Dispense Refill  . amLODipine (NORVASC) 5 MG tablet Take 1 tablet (5 mg total) by mouth daily.  30 tablet  0  . aspirin EC 81 MG tablet Take 81 mg by mouth daily.      . Calcium Carbonate-Vitamin D (CALTRATE 600+D) 600-400 MG-UNIT per tablet Take 1 tablet by mouth daily.      Marland Kitchen estazolam (PROSOM) 1 MG tablet Take 1 mg by mouth at bedtime.      Marland Kitchen glimepiride (AMARYL) 2 MG tablet Take 2 mg by mouth daily before breakfast.      . insulin aspart (NOVOLOG FLEXPEN) 100 UNIT/ML injection Inject 3 Units into the skin 3 (three) times daily before meals. If blood sugar is over 200, take 5 units and if they go over 300, take 6 units at that meal.      . levothyroxine (SYNTHROID, LEVOTHROID) 88 MCG tablet Take 88 mcg by mouth daily before breakfast.      . Multiple Vitamin (MULTIVITAMIN WITH MINERALS) TABS Take 1 tablet  by mouth daily.      Marland Kitchen omeprazole (PRILOSEC) 20 MG capsule Take 1 capsule (20 mg total) by mouth daily.  30 capsule  0  . psyllium (METAMUCIL) 58.6 % powder Take 1 packet by mouth as directed.      . tolterodine (DETROL LA) 4 MG 24 hr capsule Take 4 mg by mouth daily.      . traMADol (ULTRAM) 50 MG tablet Take 50 mg by mouth 2 (two) times daily.       No current facility-administered medications for this visit.    Allergies  Other  Electrocardiogram:  Assessment and Plan

## 2013-03-26 NOTE — Assessment & Plan Note (Signed)
Resolved R/O echo normal helped by GERD meds  Observe no myovue given age

## 2013-08-04 ENCOUNTER — Other Ambulatory Visit: Payer: Self-pay

## 2013-08-10 ENCOUNTER — Other Ambulatory Visit: Payer: Self-pay | Admitting: *Deleted

## 2013-08-11 ENCOUNTER — Telehealth: Payer: Self-pay | Admitting: *Deleted

## 2013-08-11 ENCOUNTER — Ambulatory Visit (INDEPENDENT_AMBULATORY_CARE_PROVIDER_SITE_OTHER): Payer: Medicare HMO | Admitting: Endocrinology

## 2013-08-11 ENCOUNTER — Other Ambulatory Visit: Payer: Self-pay | Admitting: *Deleted

## 2013-08-11 ENCOUNTER — Encounter: Payer: Self-pay | Admitting: Endocrinology

## 2013-08-11 VITALS — BP 108/44 | HR 67 | Temp 97.8°F | Resp 10 | Ht 63.0 in | Wt 141.7 lb

## 2013-08-11 DIAGNOSIS — N3281 Overactive bladder: Secondary | ICD-10-CM

## 2013-08-11 DIAGNOSIS — R609 Edema, unspecified: Secondary | ICD-10-CM

## 2013-08-11 DIAGNOSIS — E039 Hypothyroidism, unspecified: Secondary | ICD-10-CM

## 2013-08-11 DIAGNOSIS — N189 Chronic kidney disease, unspecified: Secondary | ICD-10-CM

## 2013-08-11 DIAGNOSIS — E119 Type 2 diabetes mellitus without complications: Secondary | ICD-10-CM

## 2013-08-11 DIAGNOSIS — N318 Other neuromuscular dysfunction of bladder: Secondary | ICD-10-CM

## 2013-08-11 LAB — LIPID PANEL
Cholesterol: 174 mg/dL (ref 0–200)
HDL: 59.7 mg/dL (ref >39.00)
Triglycerides: 78 mg/dL (ref 0.0–149.0)
VLDL: 15.6 mg/dL (ref 0.0–40.0)

## 2013-08-11 LAB — CBC WITH DIFFERENTIAL/PLATELET
Basophils Relative: 0.5 % (ref 0.0–3.0)
Eosinophils Relative: 1 % (ref 0.0–5.0)
HCT: 35.3 % — ABNORMAL LOW (ref 36.0–46.0)
Lymphs Abs: 2.9 10*3/uL (ref 0.7–4.0)
MCV: 94.1 fl (ref 78.0–100.0)
Monocytes Absolute: 0.6 10*3/uL (ref 0.1–1.0)
Monocytes Relative: 7.5 % (ref 3.0–12.0)
Neutrophils Relative %: 56.8 % (ref 43.0–77.0)
RBC: 3.75 Mil/uL — ABNORMAL LOW (ref 3.87–5.11)
WBC: 8.4 10*3/uL (ref 4.5–10.5)

## 2013-08-11 LAB — COMPREHENSIVE METABOLIC PANEL
Alkaline Phosphatase: 69 U/L (ref 39–117)
BUN: 29 mg/dL — ABNORMAL HIGH (ref 6–23)
Glucose, Bld: 67 mg/dL — ABNORMAL LOW (ref 70–99)
Total Bilirubin: 0.6 mg/dL (ref 0.3–1.2)

## 2013-08-11 MED ORDER — FUROSEMIDE 20 MG PO TABS: 20.0000 mg | ORAL_TABLET | Freq: Every day | ORAL | Status: DC

## 2013-08-11 MED ORDER — DARIFENACIN HYDROBROMIDE ER 7.5 MG PO TB24: 7.5000 mg | ORAL_TABLET | Freq: Every day | ORAL | Status: DC

## 2013-08-11 MED ORDER — FESOTERODINE FUMARATE ER 4 MG PO TB24: 4.0000 mg | ORAL_TABLET | Freq: Every day | ORAL | Status: DC

## 2013-08-11 NOTE — Telephone Encounter (Signed)
rx sent

## 2013-08-11 NOTE — Progress Notes (Signed)
Patient ID: [name], female   DOB: 11/06/1915, 79 y.o.   MRN: [id number]  [name] is an 79 y.o. female.   Reason for Appointment: Diabetes follow-up   History of Present Illness   Diagnosis: Type 2 DIABETES MELITUS, date of diagnosis: 1973         She has been on low dose NovoLog at mealtimes along with Amaryl for several years with good control Has not required basal insulin except when she was getting steroids Her blood sugars are reasonably well-controlled for her age and her A1c has usually been just about 7% previously She did have high fasting readings about 2 weeks ago but not clear why. Also had sporadic high readings midday about 10 days ago She is usually not checking her blood sugars after supper but her average blood sugar is fairly good at 155 before meals Because of low blood sugars on her last visit she was told to stop insulin at breakfast but she started back on this  Oral hypoglycemic drugs: Amaryl       Side effects from medications: None Insulin regimen: 3 units ac          Proper timing of medications in relation to meals: Yes.         Monitors blood glucose: Once a day.    Glucometer: One Touch.          Blood Glucose readings from meter download: readings before breakfast: 133-203, recent lunchtime readings 109-148 and evening 113-156  Hypoglycemia frequency:  none recently.          Meals: 3 meals per day.          Physical activity: exercise: Unable to do any            The last HbgA1c was reported as 7.1 in 6/14 and previously ranging from 7.1-7.5    Wt Readings from Last 3 Encounters:  08/11/13 141 lb 11.2 oz (64.275 kg)  03/26/13 129 lb (58.514 kg)  03/15/13 140 lb (63.504 kg)   EDEMA: She is having significant problem with her leg swelling since her last visit. She was told to take Lasix 20 mg until her swelling was better but she somehow did not start on this. She is trying to wear elastic stockings but unable to do this everyday      Medication List       This list is accurate as of: 08/11/13  1:12 PM.  Always use your most recent med list.               ACCU-CHEK AVIVA PLUS test strip  Generic drug:  glucose blood  3 (three) times daily.     aspirin EC 81 MG tablet  Take 81 mg by mouth daily.     CALTRATE 600+D 600-400 MG-UNIT per tablet  Generic drug:  Calcium Carbonate-Vitamin D  Take 1 tablet by mouth daily.     estazolam 1 MG tablet  Commonly known as:  PROSOM  Take 1 mg by mouth at bedtime.     FLUVIRIN IM     glimepiride 2 MG tablet  Commonly known as:  AMARYL  Take 2 mg by mouth daily before breakfast.     levothyroxine 88 MCG tablet  Commonly known as:  SYNTHROID, LEVOTHROID  Take 88 mcg by mouth daily before breakfast.     multivitamin with minerals Tabs tablet  Take 1 tablet by mouth daily.     NOVOFINE 32G X 6 MM  Misc  Generic drug:  Insulin Pen Needle     NOVOLOG FLEXPEN 100 UNIT/ML injection  Generic drug:  insulin aspart  Inject 3 Units into the skin 3 (three) times daily before meals. If blood sugar is over 200, take 5 units and if they go over 300, take 6 units at that meal.     omeprazole 20 MG capsule  Commonly known as:  PRILOSEC  Take 1 capsule (20 mg total) by mouth daily.     pregabalin 75 MG capsule  Commonly known as:  LYRICA  Take 75 mg by mouth 2 (two) times daily.     psyllium 58.6 % powder  Commonly known as:  METAMUCIL  Take 1 packet by mouth as directed.     tolterodine 4 MG 24 hr capsule  Commonly known as:  DETROL LA  Take 4 mg by mouth daily.     traMADol 50 MG tablet  Commonly known as:  ULTRAM  Take 50 mg by mouth 2 (two) times daily.        Allergies:  Allergies  Allergen Reactions  . Other     Ground red pepper lotion to rub on her knee caused SOB    Past Medical History  Diagnosis Date  . Diabetes mellitus without complication   . Tuberculosis     During the Great Depression  . Hypothyroidism     Past Surgical History   Procedure Laterality Date  . Hip fracture surgery      both hips    Family History  Problem Relation Age of Onset  . Diabetes Father     Social History:  reports that she has never smoked. She does not have any smokeless tobacco history on file. She reports that she does not drink alcohol or use illicit drugs.  Review of Systems:  HYPOTHYROIDISM: She has had hypothyroidism for several years and her dose has been fairly consistent with minor adjustments. Not taking the 88 mcg that she had been earlier this year and is on 100 mcg. Her last TSH was 4.5 earlier this year  HYPERLIPIDEMIA: She has not been on any lipid-lowering drugs for several years  OAB: She has had significant problem with overactive bladder and was told to change her Detrol on the last visit but has had difficulty getting the brand name covert by insurance and is still taking Detrol. She still has excessive frequency of urination and nocturia  NEUROPATHY: She has had long-standing neuropathy with numbness and paresthesiae     Examination:   BP 108/44  Pulse 67  Temp(Src) 97.8 F (36.6 C)  Resp 10  Ht 5\' 3"  (1.6 m)  Wt 141 lb 11.2 oz (64.275 kg)  BMI 25.11 kg/m2  SpO2 99%  Body mass index is 25.11 kg/(m^2).   2+ pedal edema present No tenderness of her calf muscles and negative Homans sign Biceps reflexes are 1+  ASSESSMENT/ PLAN::   Diabetes type 2   The patient's diabetes control appears to be reasonably well-controlled with only occasional high readings but not recently. She is fairly consistent with her diet and pre-meal insulin doses. Again discussed with her that if she has any hypoglycemia we will need to reduce or stop the insulin at that meal  Overactive BLADDER: She is not getting enough relief with Detrol and will try her on Enablex  EDEMA: She appears to be having venous edema of her legs. She will start taking Lasix daily Also she agrees to stop the Lyrica in  the evening since most of her  neuropathic symptoms are in the mid day   Hypothyroidism: She will confirm that she is taking 88 mcg instead of the 100 mcg listed on her medications  RENAL insufficiency: Not clear if etiology, this may be related to nephrosclerosis. Also this may contribute to her history of mild anemia  LIPIDS: She is not on any medication and will check her lipid levels  [name] 08/11/2013, 1:12 PM   Recent Results (from the past 2160 hour(s))  LIPID PANEL     Status: None   Collection Time    08/11/13  1:59 PM      Result Value Range   Cholesterol 174  0 - 200 mg/dL   Comment: ATP III Classification       Desirable:  < 200 mg/dL               Borderline High:  200 - 239 mg/dL          High:  > = 161 mg/dL   Triglycerides 09.6  0.0 - 149.0 mg/dL   Comment: Normal:  <045 mg/dLBorderline High:  150 - 199 mg/dL   HDL 40.98  >11.91 mg/dL   VLDL 47.8  0.0 - 29.5 mg/dL   LDL Cholesterol 99  0 - 99 mg/dL   Total CHOL/HDL Ratio 3     Comment:                Men          Women1/2 Average Risk     3.4          3.3Average Risk          5.0          4.42X Average Risk          9.6          7.13X Average Risk          15.0          11.0                      COMPREHENSIVE METABOLIC PANEL     Status: Abnormal   Collection Time    08/11/13  1:59 PM      Result Value Range   Sodium 137  135 - 145 mEq/L   Potassium 5.1  3.5 - 5.1 mEq/L   Chloride 103  96 - 112 mEq/L   CO2 26  19 - 32 mEq/L   Glucose, Bld 67 (*) 70 - 99 mg/dL   BUN 29 (*) 6 - 23 mg/dL   Creatinine, Ser 1.7 (*) 0.4 - 1.2 mg/dL   Total Bilirubin 0.6  0.3 - 1.2 mg/dL   Alkaline Phosphatase 69  39 - 117 U/L   AST 22  0 - 37 U/L   ALT 13  0 - 35 U/L   Total Protein 6.4  6.0 - 8.3 g/dL   Albumin 3.5  3.5 - 5.2 g/dL   Calcium 9.2  8.4 - 62.1 mg/dL   GFR 30.86 (*) >57.84 mL/min  CBC WITH DIFFERENTIAL     Status: Abnormal   Collection Time    08/11/13  1:59 PM      Result Value Range   WBC 8.4  4.5 - 10.5 K/uL   RBC 3.75 (*) 3.87 - 5.11  Mil/uL   Hemoglobin 11.8 (*) 12.0 - 15.0 g/dL   HCT 69.6 (*) 29.5 - 28.4 %   MCV 94.1  78.0 - 100.0 fl   MCHC 33.4  30.0 - 36.0 g/dL   RDW 16.1  09.6 - 04.5 %   Platelets 178.0  150.0 - 400.0 K/uL   Neutrophils Relative % 56.8  43.0 - 77.0 %   Lymphocytes Relative 34.2  12.0 - 46.0 %   Monocytes Relative 7.5  3.0 - 12.0 %   Eosinophils Relative 1.0  0.0 - 5.0 %   Basophils Relative 0.5  0.0 - 3.0 %   Neutro Abs 4.8  1.4 - 7.7 K/uL   Lymphs Abs 2.9  0.7 - 4.0 K/uL   Monocytes Absolute 0.6  0.1 - 1.0 K/uL   Eosinophils Absolute 0.1  0.0 - 0.7 K/uL   Basophils Absolute 0.0  0.0 - 0.1 K/uL

## 2013-08-11 NOTE — Patient Instructions (Addendum)
Start furosemide 20 mg daily for swelling of the legs and continue until swelling completely resolved Use elastic stockings during the day on the legs  Change Detrol tablets to Enablex 7.5 mg daily  Try to stop the evening dose of Lyrica  Continue the same doses of thyroid medication

## 2013-08-16 ENCOUNTER — Other Ambulatory Visit: Payer: Self-pay | Admitting: *Deleted

## 2013-09-08 ENCOUNTER — Ambulatory Visit (INDEPENDENT_AMBULATORY_CARE_PROVIDER_SITE_OTHER): Payer: Medicare HMO | Admitting: Endocrinology

## 2013-09-08 ENCOUNTER — Encounter: Payer: Self-pay | Admitting: Endocrinology

## 2013-09-08 VITALS — BP 112/62 | HR 91 | Temp 97.7°F | Resp 12 | Wt 141.0 lb

## 2013-09-08 DIAGNOSIS — E119 Type 2 diabetes mellitus without complications: Secondary | ICD-10-CM

## 2013-09-08 DIAGNOSIS — R600 Localized edema: Secondary | ICD-10-CM

## 2013-09-08 DIAGNOSIS — R609 Edema, unspecified: Secondary | ICD-10-CM

## 2013-09-08 DIAGNOSIS — E039 Hypothyroidism, unspecified: Secondary | ICD-10-CM

## 2013-09-08 DIAGNOSIS — N189 Chronic kidney disease, unspecified: Secondary | ICD-10-CM

## 2013-09-08 LAB — CBC WITH DIFFERENTIAL/PLATELET
Basophils Relative: 0.4 % (ref 0.0–3.0)
Eosinophils Absolute: 0 10*3/uL (ref 0.0–0.7)
Eosinophils Relative: 0.3 % (ref 0.0–5.0)
HCT: 34.3 % — ABNORMAL LOW (ref 36.0–46.0)
Lymphs Abs: 2.6 10*3/uL (ref 0.7–4.0)
MCHC: 33.9 g/dL (ref 30.0–36.0)
MCV: 91.9 fl (ref 78.0–100.0)
Monocytes Absolute: 0.6 10*3/uL (ref 0.1–1.0)
Neutro Abs: 5.6 10*3/uL (ref 1.4–7.7)
Neutrophils Relative %: 63.3 % (ref 43.0–77.0)
RBC: 3.74 Mil/uL — ABNORMAL LOW (ref 3.87–5.11)

## 2013-09-08 LAB — BASIC METABOLIC PANEL
CO2: 27 mEq/L (ref 19–32)
Chloride: 105 mEq/L (ref 96–112)
Creatinine, Ser: 1.6 mg/dL — ABNORMAL HIGH (ref 0.4–1.2)
Potassium: 4.8 mEq/L (ref 3.5–5.1)

## 2013-09-08 LAB — TSH: TSH: 0.51 u[IU]/mL (ref 0.35–5.50)

## 2013-09-08 NOTE — Progress Notes (Signed)
Patient ID: [name], female   DOB: Jun 23, 1915, 79 y.o.   MRN: [id number]  [name] is an 79 y.o. female.   Reason for Appointment: Diabetes follow-up   History of Present Illness   Diagnosis: Type 2 DIABETES MELITUS, date of diagnosis: 1973         She has been on low dose NovoLog at mealtimes along with Amaryl for several years with good control Has not required basal insulin except when she was getting steroids Her blood sugars are reasonably well-controlled for her age and her A1c has usually been just about 7% previously She did have high fasting readings about 2 weeks ago but not clear why. Also had sporadic high readings midday about 10 days ago She is usually not checking her blood sugars after supper but her average blood sugar is fairly good at 155 before meals  Oral hypoglycemic drugs: Amaryl       Side effects from medications: None Insulin regimen: 3 units NovoLog ac          Proper timing of medications in relation to meals: Yes.         Monitors blood glucose: Once a day.    Glucometer: Accucheck         Blood Glucose readings not reviewed: Recently glucose mostly okay except once 229 before supper  Hypoglycemia frequency:  none recently.          Meals: 3 meals per day.          Physical activity: exercise: Unable to do any            The last HbgA1c was reported as 7.1 in 6/14 and previously ranging from 7.1-7.5    Wt Readings from Last 3 Encounters:  09/08/13 141 lb (63.957 kg)  08/11/13 141 lb 11.2 oz (64.275 kg)  03/26/13 129 lb (58.514 kg)   EDEMA: She was having significant problem with her leg swelling. She was told to take Lasix 20 mg and her swelling is better  She is trying to wear elastic stockings but unable to do this everyday  HYPOTHYROIDISM: She has been on thyroid supplements long-term, TSH needs to be updated     Medication List       This list is accurate as of: 09/08/13  1:43 PM.  Always use your most recent med list.               ACCU-CHEK AVIVA PLUS test strip  Generic drug:  glucose blood  3 (three) times daily.     aspirin EC 81 MG tablet  Take 81 mg by mouth daily.     CALTRATE 600+D 600-400 MG-UNIT per tablet  Generic drug:  Calcium Carbonate-Vitamin D  Take 1 tablet by mouth daily.     darifenacin 7.5 MG 24 hr tablet  Commonly known as:  ENABLEX  Take 1 tablet (7.5 mg total) by mouth daily.     estazolam 1 MG tablet  Commonly known as:  PROSOM  Take 1 mg by mouth at bedtime.     fesoterodine 4 MG Tb24 tablet  Commonly known as:  TOVIAZ  Take 1 tablet (4 mg total) by mouth daily.     FLUVIRIN IM     furosemide 20 MG tablet  Commonly known as:  LASIX  Take 1 tablet (20 mg total) by mouth daily.     glimepiride 2 MG tablet  Commonly known as:  AMARYL  Take 2 mg by mouth daily before  breakfast.     levothyroxine 88 MCG tablet  Commonly known as:  SYNTHROID, LEVOTHROID  Take 88 mcg by mouth daily before breakfast.     multivitamin with minerals Tabs tablet  Take 1 tablet by mouth daily.     NOVOFINE 32G X 6 MM Misc  Generic drug:  Insulin Pen Needle     NOVOLOG FLEXPEN 100 UNIT/ML injection  Generic drug:  insulin aspart  Inject 2 Units into the skin 3 (three) times daily before meals. If blood sugar is over 200, take 5 units and if they go over 300, take 6 units at that meal.     omeprazole 20 MG capsule  Commonly known as:  PRILOSEC  Take 1 capsule (20 mg total) by mouth daily.     pregabalin 75 MG capsule  Commonly known as:  LYRICA  Take 75 mg by mouth 2 (two) times daily.     psyllium 58.6 % powder  Commonly known as:  METAMUCIL  Take 1 packet by mouth as directed.     tolterodine 4 MG 24 hr capsule  Commonly known as:  DETROL LA  Take 4 mg by mouth daily.     traMADol 50 MG tablet  Commonly known as:  ULTRAM  Take 50 mg by mouth 2 (two) times daily.        Allergies:  Allergies  Allergen Reactions  . Other     Ground red pepper lotion to rub on her knee  caused SOB    Past Medical History  Diagnosis Date  . Diabetes mellitus without complication   . Tuberculosis     During the Great Depression  . Hypothyroidism     Past Surgical History  Procedure Laterality Date  . Hip fracture surgery      both hips    Family History  Problem Relation Age of Onset  . Diabetes Father     Social History:  reports that she has never smoked. She does not have any smokeless tobacco history on file. She reports that she does not drink alcohol or use illicit drugs.  Review of Systems:  HYPOTHYROIDISM: She has had hypothyroidism for several years and her dose has been fairly consistent with minor adjustments. She is taking the 88 mcg more recently And has no complaints of recent unusual fatigue. Her last TSH was 4.5 earlier this year  HYPERLIPIDEMIA: She has not been on any lipid-lowering drugs for several years  OAB: She has had significant problem with overactive bladder and was told to change her Detrol on the last visit but has had difficulty getting the brand name covert by insurance and is still taking unknown medication now. She  has excessive frequency of urination and nocturia  NEUROPATHY: She has had long-standing neuropathy with numbness and paresthesiae, better with Lyrica. She tried to cut back to once a day but had more symptoms at night and sleeps better with them it does Asking about pain in her left second toe   Had long-standing low back pain      Examination:   BP 112/62  Pulse 91  Temp(Src) 97.7 F (36.5 C) (Oral)  Resp 12  Wt 141 lb (63.957 kg)  BMI 24.98 kg/m2  SpO2 94%  Body mass index is 24.98 kg/(m^2).   1+ pedal edema present Left 2nd toe shows no infection, has some onychomycosis and no unusual redness, is tender on the tip of the nailbed   Feet are normal to inspection otherwise  ASSESSMENT/ PLAN::   Diabetes type 2   The patient's diabetes control appears to be reasonably well-controlled with only  occasional high readings  Discussed that she is not checking any readings after suppertime and may have high readings especially when she is eating out  She is fairly consistent with her diet and pre-meal insulin doses. Again reminded her to call if she starts having hyperglycemia  Overactive BLADDER: She is ? not taking any medications now, This needs to be confirmed   EDEMA: She appears to be having venous edema of her legs which is much better with her taking Lasix daily Since she has had a high creatinine and blood pressure is low normal will try her on every other day Lasix and continue elastic stockings  Hypothyroidism: She will confirm adequacy of her supplement since thyroid levels have not been checked recently Currently she is taking 88 mcg  and she confirms this today  RENAL insufficiency: Not clear if etiology, this may be related to nephrosclerosis. Last creatinine 1.7  Also this may contribute to her history of mild anemia  will check hemoglobin today  LIPIDS: LDL is below 100 and because of her age will not start her on the treatment  Left second toe pain: She will followup with podiatrist, most likely this is not significant  [name] 09/08/2013, 1:43 PM   Office Visit on 09/08/2013  Component Date Value Range Status  . Free T4 09/08/2013 1.14  0.60 - 1.60 ng/dL Final  . TSH 96/03/5408 0.51  0.35 - 5.50 uIU/mL Final  . Sodium 09/08/2013 138  135 - 145 mEq/L Final  . Potassium 09/08/2013 4.8  3.5 - 5.1 mEq/L Final  . Chloride 09/08/2013 105  96 - 112 mEq/L Final  . CO2 09/08/2013 27  19 - 32 mEq/L Final  . Glucose, Bld 09/08/2013 196* 70 - 99 mg/dL Final  . BUN 81/19/1478 32* 6 - 23 mg/dL Final  . Creatinine, Ser 09/08/2013 1.6* 0.4 - 1.2 mg/dL Final  . Calcium 29/56/2130 9.0  8.4 - 10.5 mg/dL Final  . GFR 86/57/8469 31.31* >60.00 mL/min Final  . WBC 09/08/2013 8.9  4.5 - 10.5 K/uL Final  . RBC 09/08/2013 3.74* 3.87 - 5.11 Mil/uL Final  . Hemoglobin 09/08/2013  11.6* 12.0 - 15.0 g/dL Final  . HCT 62/95/2841 34.3* 36.0 - 46.0 % Final  . MCV 09/08/2013 91.9  78.0 - 100.0 fl Final  . MCHC 09/08/2013 33.9  30.0 - 36.0 g/dL Final  . RDW 32/44/0102 13.2  11.5 - 14.6 % Final  . Platelets 09/08/2013 146.0* 150.0 - 400.0 K/uL Final  . Neutrophils Relative % 09/08/2013 63.3  43.0 - 77.0 % Final  . Lymphocytes Relative 09/08/2013 28.8  12.0 - 46.0 % Final  . Monocytes Relative 09/08/2013 7.2  3.0 - 12.0 % Final  . Eosinophils Relative 09/08/2013 0.3  0.0 - 5.0 % Final  . Basophils Relative 09/08/2013 0.4  0.0 - 3.0 % Final  . Neutro Abs 09/08/2013 5.6  1.4 - 7.7 K/uL Final  . Lymphs Abs 09/08/2013 2.6  0.7 - 4.0 K/uL Final  . Monocytes Absolute 09/08/2013 0.6  0.1 - 1.0 K/uL Final  . Eosinophils Absolute 09/08/2013 0.0  0.0 - 0.7 K/uL Final  . Basophils Absolute 09/08/2013 0.0  0.0 - 0.1 K/uL Final

## 2013-09-08 NOTE — Patient Instructions (Addendum)
Lasix every 2 days   Check at 8-9 pm instead of before supper  Take 4 units if eating out

## 2013-09-09 NOTE — Progress Notes (Signed)
Quick Note:  Please let patient know that the lab result is normal and no further action needed ______ 

## 2013-09-13 ENCOUNTER — Telehealth: Payer: Self-pay | Admitting: *Deleted

## 2013-09-13 NOTE — Telephone Encounter (Signed)
Message copied by Hermenia Bers on Mon Sep 13, 2013  1:07 PM ------      Message from: Reather Littler      Created: Thu Sep 09, 2013 10:57 AM       Please let patient know that the lab result is normal and no further action needed ------

## 2013-09-13 NOTE — Telephone Encounter (Signed)
Results given to patient

## 2013-09-30 ENCOUNTER — Other Ambulatory Visit: Payer: Self-pay | Admitting: *Deleted

## 2013-09-30 MED ORDER — LEVOTHYROXINE SODIUM 88 MCG PO TABS: 88.0000 ug | ORAL_TABLET | Freq: Every day | ORAL | Status: DC

## 2013-09-30 MED ORDER — PREGABALIN 75 MG PO CAPS: 75.0000 mg | ORAL_CAPSULE | Freq: Two times a day (BID) | ORAL | Status: DC

## 2013-11-04 ENCOUNTER — Other Ambulatory Visit (INDEPENDENT_AMBULATORY_CARE_PROVIDER_SITE_OTHER): Payer: Medicare HMO

## 2013-11-04 DIAGNOSIS — E119 Type 2 diabetes mellitus without complications: Secondary | ICD-10-CM

## 2013-11-04 DIAGNOSIS — N189 Chronic kidney disease, unspecified: Secondary | ICD-10-CM

## 2013-11-04 LAB — URINALYSIS, ROUTINE W REFLEX MICROSCOPIC
Bilirubin Urine: NEGATIVE
Hgb urine dipstick: NEGATIVE
Nitrite: NEGATIVE
Total Protein, Urine: NEGATIVE
Urobilinogen, UA: 0.2 (ref 0.0–1.0)

## 2013-11-04 LAB — MICROALBUMIN / CREATININE URINE RATIO
Creatinine,U: 173.1 mg/dL
Microalb Creat Ratio: 0.8 mg/g (ref 0.0–30.0)
Microalb, Ur: 1.3 mg/dL (ref 0.0–1.9)

## 2013-11-04 LAB — COMPREHENSIVE METABOLIC PANEL
Alkaline Phosphatase: 63 U/L (ref 39–117)
BUN: 33 mg/dL — ABNORMAL HIGH (ref 6–23)
CO2: 29 mEq/L (ref 19–32)
Creatinine, Ser: 1.5 mg/dL — ABNORMAL HIGH (ref 0.4–1.2)
GFR: 33.43 mL/min — ABNORMAL LOW (ref >60.00)
Glucose, Bld: 103 mg/dL — ABNORMAL HIGH (ref 70–99)
Sodium: 140 mEq/L (ref 135–145)
Total Bilirubin: 0.3 mg/dL (ref 0.3–1.2)

## 2013-11-10 ENCOUNTER — Ambulatory Visit (INDEPENDENT_AMBULATORY_CARE_PROVIDER_SITE_OTHER): Payer: Medicare HMO | Admitting: Endocrinology

## 2013-11-10 ENCOUNTER — Encounter: Payer: Self-pay | Admitting: Endocrinology

## 2013-11-10 VITALS — BP 122/58 | HR 65 | Temp 98.3°F | Resp 12 | Ht 63.0 in | Wt 143.6 lb

## 2013-11-10 DIAGNOSIS — E119 Type 2 diabetes mellitus without complications: Secondary | ICD-10-CM

## 2013-11-10 DIAGNOSIS — E039 Hypothyroidism, unspecified: Secondary | ICD-10-CM

## 2013-11-10 MED ORDER — TRAMADOL HCL 50 MG PO TABS: 50.0000 mg | ORAL_TABLET | Freq: Two times a day (BID) | ORAL | Status: DC

## 2013-11-10 MED ORDER — DICLOFENAC SODIUM 1 % TD GEL: 2.0000 g | Freq: Four times a day (QID) | TRANSDERMAL | Status: DC

## 2013-11-10 MED ORDER — PRAVASTATIN SODIUM 20 MG PO TABS: 20.0000 mg | ORAL_TABLET | Freq: Every day | ORAL | Status: DC

## 2013-11-10 NOTE — Progress Notes (Signed)
Patient ID: [name], female   DOB: 02/27/1915, 79 y.o.   MRN: [id number]  [name] is an 79 y.o. female.   Reason for Appointment:  Knee pain  History of Present Illness   1. She has had osteoarthritis for some time and is now complaining more about her knee joint pain. This is despite taking tramadol. She does not think she has tried any diclofenac gel previously. No recent swelling in her knees. Also has had periodic low back pain, not seeing orthopedic physician regularly now 2.  EDEMA: She was having significant problem with her legs swelling. She was told to take Lasix 20 mg daily on the last visit. She thinks her legs are less swollen and she did not take the Lasix when she goes out in the mornings. She is trying to wear elastic stockings but unable to do this everyday  3. Type 2 DIABETES MELITUS  She has been on low dose NovoLog at mealtimes along with Amaryl for several years with good control Has not required basal insulin except when she was getting steroids Her blood sugars are reasonably well-controlled for her age and her A1c has usually been around 7%, now relatively higher at 7.8 She is usually not checking her blood sugars after supper regularly She does say that when she is eating out her blood sugars will tend to be higher and occasionally over 200  She does take 4 units of insulin when eating out but 3 units on other days Did not bring her monitor for download today No hypoglycemia  Wt Readings from Last 3 Encounters:  11/10/13 143 lb 9.6 oz (65.137 kg)  09/08/13 141 lb (63.957 kg)  08/11/13 141 lb 11.2 oz (64.275 kg)   4. HYPOTHYROIDISM: She has been on thyroid supplements long-term, currently taking 88 mcg and is compliant with this  Lab Results  Component Value Date   TSH 0.51 09/08/2013       Medication List       This list is accurate as of: 11/10/13  1:26 PM.  Always use your most recent med list.               ACCU-CHEK AVIVA PLUS  test strip  Generic drug:  glucose blood  3 (three) times daily.     aspirin EC 81 MG tablet  Take 81 mg by mouth daily.     CALTRATE 600+D 600-400 MG-UNIT per tablet  Generic drug:  Calcium Carbonate-Vitamin D  Take 1 tablet by mouth daily.     estazolam 1 MG tablet  Commonly known as:  PROSOM  Take 1 mg by mouth at bedtime.     FLUVIRIN IM     furosemide 20 MG tablet  Commonly known as:  LASIX  Take 1 tablet (20 mg total) by mouth daily.     glimepiride 2 MG tablet  Commonly known as:  AMARYL  Take 2 mg by mouth daily before breakfast.     levothyroxine 88 MCG tablet  Commonly known as:  SYNTHROID, LEVOTHROID  Take 1 tablet (88 mcg total) by mouth daily before breakfast. Take 2 tablets once a day     multivitamin with minerals Tabs tablet  Take 1 tablet by mouth daily.     NOVOFINE 32G X 6 MM Misc  Generic drug:  Insulin Pen Needle     NOVOLOG FLEXPEN 100 UNIT/ML injection  Generic drug:  insulin aspart  Inject 2 Units into the skin 3 (three) times daily  before meals. If blood sugar is over 200, take 5 units and if they go over 300, take 6 units at that meal.     omeprazole 20 MG capsule  Commonly known as:  PRILOSEC  Take 1 capsule (20 mg total) by mouth daily.     pregabalin 75 MG capsule  Commonly known as:  LYRICA  Take 1 capsule (75 mg total) by mouth 2 (two) times daily.     psyllium 58.6 % powder  Commonly known as:  METAMUCIL  Take 1 packet by mouth as directed.     traMADol 50 MG tablet  Commonly known as:  ULTRAM  Take 50 mg by mouth 2 (two) times daily.        Allergies:  Allergies  Allergen Reactions  . Other     Ground red pepper lotion to rub on her knee caused SOB    Past Medical History  Diagnosis Date  . Diabetes mellitus without complication   . Tuberculosis     During the Great Depression  . Hypothyroidism     Past Surgical History  Procedure Laterality Date  . Hip fracture surgery      both hips    Family History   Problem Relation Age of Onset  . Diabetes Father     Social History:  reports that she has never smoked. She does not have any smokeless tobacco history on file. She reports that she does not drink alcohol or use illicit drugs.  Review of Systems:  HYPOTHYROIDISM: She has had hypothyroidism for several years and her dose has been fairly consistent with minor adjustments. She is taking the 88 mcg more recently And has no complaints of recent unusual fatigue. Her last TSH was 4.5 earlier this year  HYPERLIPIDEMIA: She has not been on any lipid-lowering drugs for several years  OAB: She has had an overactive bladder and has been tried on Detrol previously. She is not complaining of any symptoms today  NEUROPATHY: She has had long-standing neuropathy with numbness and paresthesiae, better with Lyrica. She tried to cut back to once a day but had more symptoms at night and sleeps better with them it does     Examination:   BP 122/58  Pulse 65  Temp(Src) 98.3 F (36.8 C)  Resp 12  Ht 5\' 3"  (1.6 m)  Wt 143 lb 9.6 oz (65.137 kg)  BMI 25.44 kg/m2  SpO2 97%  Body mass index is 25.44 kg/(m^2).   Heart rate is about 64 with periodic ectopics No significant murmur  Trace lower leg edema present  No effusion of the knee joints, no tenderness or warmth   ASSESSMENT/ PLAN:   Diabetes type 2   The patient's diabetes control appears to be reasonably well-controlled with again occasional high postprandial readings when eating out She is fairly consistent with her pre-meal insulin doses even when eating out. Discussed that she can try to take extra 1-2 units when eating out to help with however her additional carbohydrate and fat intake To continue Amaryl as it appears to be helping her fasting readings Again reminded her to call if she starts having  hypoglycemia  Overactive BLADDER: She is not taking any medications now and appears to be asymptomatic, she will call if she has symptoms  again  EDEMA: She  has  venous edema of her legs which is much better and she is not taking Lasix regularly Advised her to continue elevation of the legs and elastic stockings  She  has no proteinuria but had low normal albumin  RENAL insufficiency: Not clear if etiology, this may be related to nephrosclerosis. Creatinine slightly better at 1.5 compared to 1.7   Also this may contribute to her history of mild anemia  Osteoarthritis especially knee joints. She does not have any effusions and will try her on Voltaren gel  LIPIDS: She is interested in statin drugs, previously on Lipitor. Will start her on pravastatin 20 mg for cardiovascular prophylaxis, discussed benefits    [name] 11/10/2013, 1:26 PM   Appointment on 11/04/2013  Component Date Value Range Status  . Hemoglobin A1C 11/04/2013 7.8* 4.6 - 6.5 % Final   Glycemic Control Guidelines for People with Diabetes:Non Diabetic:  <6%Goal of Therapy: <7%Additional Action Suggested:  >8%   . Sodium 11/04/2013 140  135 - 145 mEq/L Final  . Potassium 11/04/2013 4.6  3.5 - 5.1 mEq/L Final  . Chloride 11/04/2013 105  96 - 112 mEq/L Final  . CO2 11/04/2013 29  19 - 32 mEq/L Final  . Glucose, Bld 11/04/2013 103* 70 - 99 mg/dL Final  . BUN 81/19/1478 33* 6 - 23 mg/dL Final  . Creatinine, Ser 11/04/2013 1.5* 0.4 - 1.2 mg/dL Final  . Total Bilirubin 11/04/2013 0.3  0.3 - 1.2 mg/dL Final  . Alkaline Phosphatase 11/04/2013 63  39 - 117 U/L Final  . AST 11/04/2013 21  0 - 37 U/L Final  . ALT 11/04/2013 13  0 - 35 U/L Final  . Total Protein 11/04/2013 6.2  6.0 - 8.3 g/dL Final  . Albumin 29/56/2130 3.4* 3.5 - 5.2 g/dL Final  . Calcium 86/57/8469 9.1  8.4 - 10.5 mg/dL Final  . GFR 62/95/2841 33.43* >60.00 mL/min Final  . Microalb, Ur 11/04/2013 1.3  0.0 - 1.9 mg/dL Final  . Creatinine,U 32/44/0102 173.1   Final  . Microalb Creat Ratio 11/04/2013 0.8  0.0 - 30.0 mg/g Final  . Color, Urine 11/04/2013 LT. YELLOW  Yellow;Lt. Yellow Final  .  APPearance 11/04/2013 CLEAR  Clear Final  . Specific Gravity, Urine 11/04/2013 1.025  1.000-1.030 Final  . pH 11/04/2013 6.0  5.0 - 8.0 Final  . Total Protein, Urine 11/04/2013 NEGATIVE  Negative Final  . Urine Glucose 11/04/2013 NEGATIVE  Negative Final  . Ketones, ur 11/04/2013 TRACE  Negative Final  . Bilirubin Urine 11/04/2013 NEGATIVE  Negative Final  . Hgb urine dipstick 11/04/2013 NEGATIVE  Negative Final  . Urobilinogen, UA 11/04/2013 0.2  0.0 - 1.0 Final  . Leukocytes, UA 11/04/2013 TRACE  Negative Final  . Nitrite 11/04/2013 NEGATIVE  Negative Final  . WBC, UA 11/04/2013 0-2/hpf  0-2/hpf Final  . RBC / HPF 11/04/2013 0-2/hpf  0-2/hpf Final  . Squamous Epithelial / LPF 11/04/2013 Rare(0-4/hpf)  Rare(0-4/hpf) Final  . Renal Epithel, UA 11/04/2013 Rare(0-4/hpf)  None Final  . Bacteria, UA 11/04/2013 Rare(<10/hpf)  None Final

## 2013-11-10 NOTE — Patient Instructions (Signed)
Diclofenac gel for knees 2x daily  Pravastatin for cholesterol

## 2013-11-15 ENCOUNTER — Other Ambulatory Visit: Payer: Self-pay | Admitting: *Deleted

## 2013-11-15 MED ORDER — GLIMEPIRIDE 2 MG PO TABS: 2.0000 mg | ORAL_TABLET | Freq: Every day | ORAL | Status: DC

## 2014-02-07 ENCOUNTER — Other Ambulatory Visit: Payer: Medicare HMO

## 2014-02-08 ENCOUNTER — Other Ambulatory Visit: Payer: Medicare HMO

## 2014-02-08 ENCOUNTER — Other Ambulatory Visit (INDEPENDENT_AMBULATORY_CARE_PROVIDER_SITE_OTHER): Payer: Medicare HMO

## 2014-02-08 DIAGNOSIS — E119 Type 2 diabetes mellitus without complications: Secondary | ICD-10-CM

## 2014-02-08 DIAGNOSIS — E039 Hypothyroidism, unspecified: Secondary | ICD-10-CM

## 2014-02-08 LAB — COMPREHENSIVE METABOLIC PANEL
ALK PHOS: 58 U/L (ref 39–117)
ALT: 11 U/L (ref 0–35)
AST: 21 U/L (ref 0–37)
Albumin: 3.5 g/dL (ref 3.5–5.2)
BILIRUBIN TOTAL: 0.7 mg/dL (ref 0.3–1.2)
BUN: 22 mg/dL (ref 6–23)
CO2: 28 mEq/L (ref 19–32)
Calcium: 8.9 mg/dL (ref 8.4–10.5)
Chloride: 107 mEq/L (ref 96–112)
Creatinine, Ser: 1.3 mg/dL — ABNORMAL HIGH (ref 0.4–1.2)
GFR: 39.97 mL/min — ABNORMAL LOW (ref >60.00)
Glucose, Bld: 121 mg/dL — ABNORMAL HIGH (ref 70–99)
Potassium: 5.6 mEq/L — ABNORMAL HIGH (ref 3.5–5.1)
Sodium: 140 mEq/L (ref 135–145)
TOTAL PROTEIN: 6.3 g/dL (ref 6.0–8.3)

## 2014-02-08 LAB — TSH: TSH: 1.27 u[IU]/mL (ref 0.35–5.50)

## 2014-02-08 LAB — HEMOGLOBIN A1C: Hgb A1c MFr Bld: 8.1 % — ABNORMAL HIGH (ref 4.6–6.5)

## 2014-02-08 LAB — MICROALBUMIN / CREATININE URINE RATIO
CREATININE, U: 102 mg/dL
MICROALB UR: 1.7 mg/dL (ref 0.0–1.9)
Microalb Creat Ratio: 1.7 mg/g (ref 0.0–30.0)

## 2014-02-08 LAB — LIPID PANEL
CHOL/HDL RATIO: 2
CHOLESTEROL: 141 mg/dL (ref 0–200)
HDL: 71.6 mg/dL (ref >39.00)
LDL CALC: 57 mg/dL (ref 0–99)
Triglycerides: 62 mg/dL (ref 0.0–149.0)
VLDL: 12.4 mg/dL (ref 0.0–40.0)

## 2014-02-08 LAB — T4, FREE: Free T4: 1.04 ng/dL (ref 0.60–1.60)

## 2014-02-09 ENCOUNTER — Encounter: Payer: Self-pay | Admitting: Endocrinology

## 2014-02-09 ENCOUNTER — Other Ambulatory Visit: Payer: Self-pay | Admitting: *Deleted

## 2014-02-09 ENCOUNTER — Ambulatory Visit (INDEPENDENT_AMBULATORY_CARE_PROVIDER_SITE_OTHER): Payer: Commercial Managed Care - HMO | Admitting: Endocrinology

## 2014-02-09 VITALS — BP 118/50 | HR 70 | Temp 98.0°F | Resp 14 | Ht 63.0 in | Wt 146.4 lb

## 2014-02-09 DIAGNOSIS — E119 Type 2 diabetes mellitus without complications: Secondary | ICD-10-CM

## 2014-02-09 DIAGNOSIS — N183 Chronic kidney disease, stage 3 unspecified: Secondary | ICD-10-CM

## 2014-02-09 DIAGNOSIS — E039 Hypothyroidism, unspecified: Secondary | ICD-10-CM

## 2014-02-09 DIAGNOSIS — M7061 Trochanteric bursitis, right hip: Secondary | ICD-10-CM

## 2014-02-09 DIAGNOSIS — R609 Edema, unspecified: Secondary | ICD-10-CM

## 2014-02-09 DIAGNOSIS — M76899 Other specified enthesopathies of unspecified lower limb, excluding foot: Secondary | ICD-10-CM

## 2014-02-09 MED ORDER — TRAMADOL HCL 50 MG PO TABS: 50.0000 mg | ORAL_TABLET | Freq: Two times a day (BID) | ORAL | Status: DC

## 2014-02-09 MED ORDER — INSULIN ASPART 100 UNIT/ML ~~LOC~~ SOLN: 2.0000 [IU] | Freq: Three times a day (TID) | SUBCUTANEOUS | Status: DC

## 2014-02-09 MED ORDER — DICLOFENAC SODIUM 3 % TD GEL: 15.0000 mg | Freq: Two times a day (BID) | TRANSDERMAL | Status: DC

## 2014-02-09 NOTE — Patient Instructions (Signed)
May check am sugar 3x per week  Call if leg swelling persists  Refill Diclofenac gel

## 2014-02-09 NOTE — Progress Notes (Signed)
Patient ID: [name], female   DOB: [birth year], 80 y.o.   MRN: [id number]   Reason for Appointment:  Knee pain  History of Present Illness   1. She has had osteoarthritis in various joints and is still complaining  about her knee joint pain. This is despite taking tramadol and starting diclofenac gel 1%. The gel helps only temporarily and she is not able to move around as much. She is also complaining about her whole right leg hurting including up to the hip on the side which is relatively new. No worsening of back pain, no numbness or tingling in her feet that is new 2.  Lower leg EDEMA: She is still having intermittent swelling but is not taking any Lasix recently. She is trying to wear elastic stockings but unable to do this everyday  3. Type 2 DIABETES MELITUS  She has been on low dose NovoLog at mealtimes along with Amaryl for several years with good control Has not required basal insulin except when she was getting steroids Her blood sugars are reasonably well-controlled for her age and her A1c is again  relatively higher at 8.1; however not clear where her blood sugars are high; has only sporadic readings around 180-200 around supper time She is usually not checking her blood sugars after supper  She does occasionally eat sweets She will take 4 units of insulin when eating out but 3 units on other days Average glucose at home is 136 with the average at different times of the day ranging from 133-141, recently glucose readings look better No hypoglycemia, lowest glucose 88  Wt Readings from Last 3 Encounters:  02/09/14 146 lb 6.4 oz (66.407 kg)  11/10/13 143 lb 9.6 oz (65.137 kg)  09/08/13 141 lb (63.957 kg)   Lab Results  Component Value Date   HGBA1C 8.1* 02/08/2014   HGBA1C 7.8* 11/04/2013   HGBA1C 7.6* 03/15/2013   Lab Results  Component Value Date   MICROALBUR 1.7 02/08/2014   LDLCALC 57 02/08/2014   CREATININE 1.3* 02/08/2014    4. HYPOTHYROIDISM: She has been on  thyroid supplement long-term, currently taking 88 mcg and is compliant with this. Usually has stable levels of TSH and does not complain of unusual fatigue  Lab Results  Component Value Date   TSH 1.27 02/08/2014       Medication List       This list is accurate as of: 02/09/14  1:41 PM.  Always use your most recent med list.               ACCU-CHEK AVIVA PLUS test strip  Generic drug:  glucose blood  3 (three) times daily.     aspirin EC 81 MG tablet  Take 81 mg by mouth daily.     CALTRATE 600+D 600-400 MG-UNIT per tablet  Generic drug:  Calcium Carbonate-Vitamin D  Take 1 tablet by mouth daily.     diclofenac sodium 1 % Gel  Commonly known as:  VOLTAREN  Apply 2 g topically 4 (four) times daily.     estazolam 1 MG tablet  Commonly known as:  PROSOM  Take 1 mg by mouth at bedtime.     FLUVIRIN IM     furosemide 20 MG tablet  Commonly known as:  LASIX  Take 1 tablet (20 mg total) by mouth daily.     glimepiride 2 MG tablet  Commonly known as:  AMARYL  Take 1 tablet (2 mg total) by mouth daily before  breakfast.     levothyroxine 88 MCG tablet  Commonly known as:  SYNTHROID, LEVOTHROID  Take 1 tablet (88 mcg total) by mouth daily before breakfast. Take 2 tablets once a day     multivitamin with minerals Tabs tablet  Take 1 tablet by mouth daily.     NOVOFINE 32G X 6 MM Misc  Generic drug:  Insulin Pen Needle     NOVOLOG FLEXPEN 100 UNIT/ML injection  Generic drug:  insulin aspart  Inject 2 Units into the skin 3 (three) times daily before meals. If blood sugar is over 200, take 5 units and if they go over 300, take 6 units at that meal.     omeprazole 20 MG capsule  Commonly known as:  PRILOSEC  Take 1 capsule (20 mg total) by mouth daily.     pravastatin 20 MG tablet  Commonly known as:  PRAVACHOL  Take 1 tablet (20 mg total) by mouth daily.     pregabalin 75 MG capsule  Commonly known as:  LYRICA  Take 1 capsule (75 mg total) by mouth 2 (two) times  daily.     psyllium 58.6 % powder  Commonly known as:  METAMUCIL  Take 1 packet by mouth as directed.     traMADol 50 MG tablet  Commonly known as:  ULTRAM  Take 1 tablet (50 mg total) by mouth 2 (two) times daily.        Allergies:  Allergies  Allergen Reactions  . Other     Ground red pepper lotion to rub on her knee caused SOB    Past Medical History  Diagnosis Date  . Diabetes mellitus without complication   . Tuberculosis     During the Great Depression  . Hypothyroidism     Past Surgical History  Procedure Laterality Date  . Hip fracture surgery      both hips    Family History  Problem Relation Age of Onset  . Diabetes Father     Social History:  reports that she has never smoked. She does not have any smokeless tobacco history on file. She reports that she does not drink alcohol or use illicit drugs.  Review of Systems:  LIPIDS: She was restarted on a statin drug, previously had been on Lipitor. She is tolerating pravastatin 20 mg for cardiovascular prophylaxis  Lab Results  Component Value Date   CHOL 141 02/08/2014   HDL 71.60 02/08/2014   LDLCALC 57 02/08/2014   TRIG 62.0 02/08/2014   CHOLHDL 2 02/08/2014    OAB: She has had an overactive bladder and has been tried on Detrol previously. She has occasional dribbling  NEUROPATHY: She has had long-standing neuropathy with numbness and paresthesiae, better with Lyrica. She tried to cut back to once a day but had more symptoms at night and sleeps better with them it does  Mild chronic kidney disease: This is likely related to nephrosclerosis  Lab Results  Component Value Date   CREATININE 1.3* 02/08/2014   History of insomnia, takes ProSom as needed     Examination:   BP 118/50  Pulse 70  Temp(Src) 98 F (36.7 C)  Resp 14  Ht 5\' 3"  (1.6 m)  Wt 146 lb 6.4 oz (66.407 kg)  BMI 25.94 kg/m2  SpO2 94%  Body mass index is 25.94 kg/(m^2).    Trace lower leg edema present  No effusion of the  knee joints, no tenderness or warmth, has crepitus on the right side  Has  some tenderness in the right femoral trochanter   ASSESSMENT/ PLAN:   Osteoarthritis with increased pain in knee joints. She does not have any effusions and will try her on 3% Voltaren gel and also refer her to our space surgeon  Diabetes type 2   The patient's diabetes control appears to be reasonably well-controlled with again occasional high  readings at various times but recently better She is fairly consistent with her pre-meal insulin doses even when eating out. Considering her age her A1c is reasonably good To continue Amaryl as it appears to be helping her fasting readings To check more readings after supper  EDEMA: She  has  venous edema of her legs which is  better and she is not taking Lasix regularly She can continue elevation of the legs and elastic stockings  She has no proteinuria but had low normal albumin  RENAL insufficiency: Not clear if etiology, this may be related to nephrosclerosis. Creatinine slightly better at 1.3, highest level I.7  Hyperlipidemia: Has been mild and she is tolerating pravastatin, we'll continue   [facility] 02/09/2014, 1:41 PM   Appointment on 02/08/2014  Component Date Value Ref Range Status  . Hemoglobin A1C 02/08/2014 8.1* 4.6 - 6.5 % Final   Glycemic Control Guidelines for People with Diabetes:Non Diabetic:  <6%Goal of Therapy: <7%Additional Action Suggested:  >8%   . Sodium 02/08/2014 140  135 - 145 mEq/L Final  . Potassium 02/08/2014 5.6* 3.5 - 5.1 mEq/L Final  . Chloride 02/08/2014 107  96 - 112 mEq/L Final  . CO2 02/08/2014 28  19 - 32 mEq/L Final  . Glucose, Bld 02/08/2014 121* 70 - 99 mg/dL Final  . BUN 02/08/2014 22  6 - 23 mg/dL Final  . Creatinine, Ser 02/08/2014 1.3* 0.4 - 1.2 mg/dL Final  . Total Bilirubin 02/08/2014 0.7  0.3 - 1.2 mg/dL Final  . Alkaline Phosphatase 02/08/2014 58  39 - 117 U/L Final  . AST 02/08/2014 21  0 - 37 U/L Final  . ALT  02/08/2014 11  0 - 35 U/L Final  . Total Protein 02/08/2014 6.3  6.0 - 8.3 g/dL Final  . Albumin 02/08/2014 3.5  3.5 - 5.2 g/dL Final  . Calcium 02/08/2014 8.9  8.4 - 10.5 mg/dL Final  . GFR 02/08/2014 39.97* >60.00 mL/min Final  . Cholesterol 02/08/2014 141  0 - 200 mg/dL Final   ATP III Classification       Desirable:  < 200 mg/dL               Borderline High:  200 - 239 mg/dL          High:  > = 240 mg/dL  . Triglycerides 02/08/2014 62.0  0.0 - 149.0 mg/dL Final   Normal:  <150 mg/dLBorderline High:  150 - 199 mg/dL  . HDL 02/08/2014 71.60  >39.00 mg/dL Final  . VLDL 02/08/2014 12.4  0.0 - 40.0 mg/dL Final  . LDL Cholesterol 02/08/2014 57  0 - 99 mg/dL Final  . Total CHOL/HDL Ratio 02/08/2014 2   Final                  Men          Women1/2 Average Risk     3.4          3.3Average Risk          5.0          4.42X Average Risk  9.6          7.13X Average Risk          15.0          11.0                      . Microalb, Ur 02/08/2014 1.7  0.0 - 1.9 mg/dL Final  . Creatinine,U 02/08/2014 102.0   Final  . Microalb Creat Ratio 02/08/2014 1.7  0.0 - 30.0 mg/g Final  . Free T4 02/08/2014 1.04  0.60 - 1.60 ng/dL Final  . TSH 02/08/2014 1.27  0.35 - 5.50 uIU/mL Final

## 2014-03-23 ENCOUNTER — Telehealth: Payer: Self-pay | Admitting: Endocrinology

## 2014-03-23 NOTE — Telephone Encounter (Signed)
Please see below and advise.

## 2014-03-23 NOTE — Telephone Encounter (Signed)
She will need to take another tablet of glimepiride now and also at dinnertime take 6 units of insulin instead of 3 If blood sugar is still high over 200 in the morning may need Lantus also

## 2014-03-23 NOTE — Telephone Encounter (Signed)
Patient aware.

## 2014-03-23 NOTE — Telephone Encounter (Signed)
Pt would like advice on Blood sugar being over 300 right now. She had a cortisone shot yesterday could this be affecting it?

## 2014-04-05 ENCOUNTER — Other Ambulatory Visit: Payer: Self-pay | Admitting: *Deleted

## 2014-04-05 ENCOUNTER — Telehealth: Payer: Self-pay | Admitting: Endocrinology

## 2014-04-05 MED ORDER — PREGABALIN 75 MG PO CAPS: 75.0000 mg | ORAL_CAPSULE | Freq: Two times a day (BID) | ORAL | Status: DC

## 2014-04-05 NOTE — Telephone Encounter (Signed)
rx printed and on your desk for signature

## 2014-04-05 NOTE — Telephone Encounter (Signed)
ok 

## 2014-04-05 NOTE — Telephone Encounter (Signed)
Pt would like her Rx refilled Lyrica  [facility]   Thank You :)  Call back: [phone]

## 2014-04-26 ENCOUNTER — Other Ambulatory Visit: Payer: Self-pay | Admitting: *Deleted

## 2014-04-26 ENCOUNTER — Telehealth: Payer: Self-pay | Admitting: Endocrinology

## 2014-04-26 MED ORDER — LEVOTHYROXINE SODIUM 88 MCG PO TABS: ORAL_TABLET | ORAL | Status: DC

## 2014-04-26 NOTE — Telephone Encounter (Signed)
Patient would like a refill on her Synthroid   Pharmacy: [facility]  Thank You :)

## 2014-05-09 ENCOUNTER — Ambulatory Visit (INDEPENDENT_AMBULATORY_CARE_PROVIDER_SITE_OTHER): Payer: Medicare HMO | Admitting: Endocrinology

## 2014-05-09 ENCOUNTER — Encounter: Payer: Self-pay | Admitting: Endocrinology

## 2014-05-09 ENCOUNTER — Other Ambulatory Visit: Payer: Self-pay | Admitting: *Deleted

## 2014-05-09 VITALS — BP 122/40 | HR 75 | Temp 97.9°F | Resp 14 | Ht 63.0 in | Wt 141.0 lb

## 2014-05-09 DIAGNOSIS — E119 Type 2 diabetes mellitus without complications: Secondary | ICD-10-CM

## 2014-05-09 DIAGNOSIS — R600 Localized edema: Secondary | ICD-10-CM

## 2014-05-09 DIAGNOSIS — N183 Chronic kidney disease, stage 3 unspecified: Secondary | ICD-10-CM

## 2014-05-09 DIAGNOSIS — E039 Hypothyroidism, unspecified: Secondary | ICD-10-CM

## 2014-05-09 DIAGNOSIS — D649 Anemia, unspecified: Secondary | ICD-10-CM

## 2014-05-09 DIAGNOSIS — R609 Edema, unspecified: Secondary | ICD-10-CM

## 2014-05-09 LAB — COMPREHENSIVE METABOLIC PANEL
ALT: 15 U/L (ref 0–35)
AST: 24 U/L (ref 0–37)
Albumin: 3.5 g/dL (ref 3.5–5.2)
Alkaline Phosphatase: 57 U/L (ref 39–117)
BILIRUBIN TOTAL: 0.4 mg/dL (ref 0.2–1.2)
BUN: 45 mg/dL — ABNORMAL HIGH (ref 6–23)
CALCIUM: 8.9 mg/dL (ref 8.4–10.5)
CHLORIDE: 103 meq/L (ref 96–112)
CO2: 27 mEq/L (ref 19–32)
Creatinine, Ser: 1.6 mg/dL — ABNORMAL HIGH (ref 0.4–1.2)
GFR: 30.83 mL/min — ABNORMAL LOW (ref >60.00)
Glucose, Bld: 144 mg/dL — ABNORMAL HIGH (ref 70–99)
Potassium: 4.3 mEq/L (ref 3.5–5.1)
Sodium: 138 mEq/L (ref 135–145)
Total Protein: 6.1 g/dL (ref 6.0–8.3)

## 2014-05-09 LAB — TSH: TSH: 0.19 u[IU]/mL — ABNORMAL LOW (ref 0.35–4.50)

## 2014-05-09 LAB — HEMOGLOBIN A1C: Hgb A1c MFr Bld: 8.3 % — ABNORMAL HIGH (ref 4.6–6.5)

## 2014-05-09 LAB — CBC
HCT: 35.1 % — ABNORMAL LOW (ref 36.0–46.0)
HEMOGLOBIN: 11.6 g/dL — AB (ref 12.0–15.0)
MCHC: 33 g/dL (ref 30.0–36.0)
MCV: 92.9 fl (ref 78.0–100.0)
Platelets: 145 10*3/uL — ABNORMAL LOW (ref 150.0–400.0)
RBC: 3.78 Mil/uL — ABNORMAL LOW (ref 3.87–5.11)
RDW: 14.1 % (ref 11.5–15.5)
WBC: 9.2 10*3/uL (ref 4.0–10.5)

## 2014-05-09 MED ORDER — FUROSEMIDE 20 MG PO TABS: 20.0000 mg | ORAL_TABLET | Freq: Every day | ORAL | Status: DC

## 2014-05-09 NOTE — Progress Notes (Signed)
Patient ID: [name], female   DOB: Jan 17, 1915, 80 y.o.   MRN: [id number]   Reason for Appointment: Legs swelling  History of Present Illness   1. Lower leg EDEMA: She is still having intermittent swelling which she thinks is worse recently. Not clear if she is elevating her legs. She is trying to wear elastic stockings but does not think this is helping. Asking about diuretics; previously had been given Lasix and was told to minimize the use because of higher creatinine. Also has been chronically taking Lyrica and not clear if this is playing a role   3. Type 2 DIABETES MELITUS  She has been on low dose NovoLog at mealtimes along with Amaryl for several years with good control Has not required basal insulin except when she was getting steroids She thinks her blood sugars did go up when she had a steroid injection in her spine and did increase her insulin as directed in March as well as dose of Amaryl However she did experience occasional hypoglycemia in mid April also Her blood sugars are reasonably well-controlled for her age recently  She is usually not checking her blood sugars after supper  She does occasionally eat sweets, eating out a lot  Insulin: She will take 4 units of NovoLog insulin when eating out but 3 units on other days Blood sugars from her Accu check download:  PREMEAL Breakfast Lunch Dinner Bedtime Overall  Glucose range:  95-203   64-211   47-182  ?   47-211   Average:  132   122   133    129     Wt Readings from Last 3 Encounters:  05/09/14 132 lb 6.4 oz (60.056 kg)  02/09/14 146 lb 6.4 oz (66.407 kg)  11/10/13 143 lb 9.6 oz (65.137 kg)   Lab Results  Component Value Date   HGBA1C 8.1* 02/08/2014   HGBA1C 7.8* 11/04/2013   HGBA1C 7.6* 03/15/2013   Lab Results  Component Value Date   MICROALBUR 1.7 02/08/2014   LDLCALC 57 02/08/2014   CREATININE 1.3* 02/08/2014    4. HYPOTHYROIDISM: She has been on thyroid supplement long-term, currently taking 88 mcg  and is compliant with this. Usually has stable levels of TSH and does not complain of unusual fatigue  Lab Results  Component Value Date   TSH 1.27 02/08/2014       Medication List       This list is accurate as of: 05/09/14  1:37 PM.  Always use your most recent med list.               ACCU-CHEK AVIVA PLUS test strip  Generic drug:  glucose blood  3 (three) times daily.     aspirin EC 81 MG tablet  Take 81 mg by mouth daily.     CALTRATE 600+D 600-400 MG-UNIT per tablet  Generic drug:  Calcium Carbonate-Vitamin D  Take 1 tablet by mouth daily.     diclofenac sodium 1 % Gel  Commonly known as:  VOLTAREN  Apply 2 g topically 4 (four) times daily.     Diclofenac Sodium 3 % Gel  Place 15 mg onto the skin 2 (two) times daily. Use small amount on knee     estazolam 1 MG tablet  Commonly known as:  PROSOM  Take 1 mg by mouth at bedtime.     FLUVIRIN IM     furosemide 20 MG tablet  Commonly known as:  LASIX  Take 1 tablet (  20 mg total) by mouth daily.     glimepiride 2 MG tablet  Commonly known as:  AMARYL  Take 1 tablet (2 mg total) by mouth daily before breakfast.     insulin aspart 100 UNIT/ML injection  Commonly known as:  NOVOLOG FLEXPEN  Inject 2 Units into the skin 3 (three) times daily before meals. If blood sugar is over 200, take 5 units and if they go over 300, take 6 units at that meal.     levothyroxine 88 MCG tablet  Commonly known as:  SYNTHROID, LEVOTHROID  Take 1 tablet daily     multivitamin with minerals Tabs tablet  Take 1 tablet by mouth daily.     NOVOFINE 32G X 6 MM Misc  Generic drug:  Insulin Pen Needle     omeprazole 20 MG capsule  Commonly known as:  PRILOSEC  Take 1 capsule (20 mg total) by mouth daily.     pravastatin 20 MG tablet  Commonly known as:  PRAVACHOL  Take 1 tablet (20 mg total) by mouth daily.     pregabalin 75 MG capsule  Commonly known as:  LYRICA  Take 1 capsule (75 mg total) by mouth 2 (two) times daily.      psyllium 58.6 % powder  Commonly known as:  METAMUCIL  Take 1 packet by mouth as directed.     traMADol 50 MG tablet  Commonly known as:  ULTRAM  Take 1 tablet (50 mg total) by mouth 2 (two) times daily.        Allergies:  Allergies  Allergen Reactions  . Other     Ground red pepper lotion to rub on her knee caused SOB    Past Medical History  Diagnosis Date  . Diabetes mellitus without complication   . Tuberculosis     During the Great Depression  . Hypothyroidism     Past Surgical History  Procedure Laterality Date  . Hip fracture surgery      both hips    Family History  Problem Relation Age of Onset  . Diabetes Father     Social History:  reports that she has never smoked. She does not have any smokeless tobacco history on file. She reports that she does not drink alcohol or use illicit drugs.  Review of Systems:  LIPIDS: She was restarted on a statin drug, previously had been on Lipitor. She is tolerating pravastatin 20 mg for cardiovascular prophylaxis  Lab Results  Component Value Date   CHOL 141 02/08/2014   HDL 71.60 02/08/2014   LDLCALC 57 02/08/2014   TRIG 62.0 02/08/2014   CHOLHDL 2 02/08/2014     NEUROPATHY: She has had long-standing neuropathy with numbness and paresthesiae, better with Lyrica. She tried to cut back to once a day but had more symptoms at night and sleeps better with them it does  Mild chronic kidney disease: This is likely related to nephrosclerosis  Lab Results  Component Value Date   CREATININE 1.3* 02/08/2014    Back/hip/leg pain: She was told by other incision that she had spinal stenosis and did get some relief temporarily with epidural steroid       Examination:   BP 122/40  Pulse 75  Temp(Src) 97.9 F (36.6 C)  Resp 14  Ht 5\' 3"  (1.6 m)  Wt 132 lb 6.4 oz (60.056 kg)  BMI 23.46 kg/m2  SpO2 97%  Body mass index is 23.46 kg/(m^2).   She appears to be having some memory  difficulties today  Trace ankle edema  present  ASSESSMENT/ PLAN:   Venous edema of the legs: Since she is having variable degree of edema will give her Lasix to use as needed Advised her to minimize the use to avoid volume the patient  Diabetes type 2   The patient's diabetes control appears to be reasonably well-controlled with again occasional high and low readings at various times but recently better Will check her A1c today To continue Amaryl as it appears to be helping her fasting readings To check more readings after supper  RENAL insufficiency: Not clear if etiology, this may be related to nephrosclerosis. Creatinine to be checked  Hyperlipidemia: Has been mild and she is tolerating pravastatin, will continue  Anemia: Will need followup assessment today  Weight loss: She is not clear why she has lost 5 pounds. She thinks her appetite is generally good, will continue to follow  Elayne Snare 05/09/2014, 1:37 PM   Addendum: Creatinine 1.6 TSH is low, will reduce her dose to 75 mcg  Office Visit on 05/09/2014  Component Date Value Ref Range Status  . WBC 05/09/2014 9.2  4.0 - 10.5 K/uL Final  . RBC 05/09/2014 3.78* 3.87 - 5.11 Mil/uL Final  . Platelets 05/09/2014 145.0* 150.0 - 400.0 K/uL Final  . Hemoglobin 05/09/2014 11.6* 12.0 - 15.0 g/dL Final  . HCT 05/09/2014 35.1* 36.0 - 46.0 % Final  . MCV 05/09/2014 92.9  78.0 - 100.0 fl Final  . MCHC 05/09/2014 33.0  30.0 - 36.0 g/dL Final  . RDW 05/09/2014 14.1  11.5 - 15.5 % Final  . Sodium 05/09/2014 138  135 - 145 mEq/L Final  . Potassium 05/09/2014 4.3  3.5 - 5.1 mEq/L Final  . Chloride 05/09/2014 103  96 - 112 mEq/L Final  . CO2 05/09/2014 27  19 - 32 mEq/L Final  . Glucose, Bld 05/09/2014 144* 70 - 99 mg/dL Final  . BUN 05/09/2014 45* 6 - 23 mg/dL Final  . Creatinine, Ser 05/09/2014 1.6* 0.4 - 1.2 mg/dL Final  . Total Bilirubin 05/09/2014 0.4  0.2 - 1.2 mg/dL Final  . Alkaline Phosphatase 05/09/2014 57  39 - 117 U/L Final  . AST 05/09/2014 24  0 - 37 U/L  Final  . ALT 05/09/2014 15  0 - 35 U/L Final  . Total Protein 05/09/2014 6.1  6.0 - 8.3 g/dL Final  . Albumin 05/09/2014 3.5  3.5 - 5.2 g/dL Final  . Calcium 05/09/2014 8.9  8.4 - 10.5 mg/dL Final  . GFR 05/09/2014 30.83* >60.00 mL/min Final  . Hemoglobin A1C 05/09/2014 8.3* 4.6 - 6.5 % Final   Glycemic Control Guidelines for People with Diabetes:Non Diabetic:  <6%Goal of Therapy: <7%Additional Action Suggested:  >8%   . TSH 05/09/2014 0.19* 0.35 - 4.50 uIU/mL Final

## 2014-05-09 NOTE — Patient Instructions (Signed)
Some sugars before bedtime  Continue 3-4 units insulin as before, reduce for smaller meals  Restart Furosemide as needed only for swelling

## 2014-05-09 NOTE — Progress Notes (Signed)
Quick Note:  Thyroid level is high, need to reduce Synthroid to 75 ug; also to avoid any OTC Advil/Aleve ______

## 2014-06-03 ENCOUNTER — Other Ambulatory Visit: Payer: Self-pay | Admitting: Endocrinology

## 2014-06-20 ENCOUNTER — Ambulatory Visit (INDEPENDENT_AMBULATORY_CARE_PROVIDER_SITE_OTHER): Payer: Medicare HMO | Admitting: Endocrinology

## 2014-06-20 ENCOUNTER — Encounter: Payer: Self-pay | Admitting: *Deleted

## 2014-06-20 ENCOUNTER — Encounter: Payer: Self-pay | Admitting: Endocrinology

## 2014-06-20 VITALS — BP 108/38 | HR 74 | Temp 98.5°F | Resp 14 | Ht 63.0 in | Wt 140.6 lb

## 2014-06-20 DIAGNOSIS — E1165 Type 2 diabetes mellitus with hyperglycemia: Secondary | ICD-10-CM

## 2014-06-20 DIAGNOSIS — R634 Abnormal weight loss: Secondary | ICD-10-CM

## 2014-06-20 DIAGNOSIS — R609 Edema, unspecified: Secondary | ICD-10-CM

## 2014-06-20 DIAGNOSIS — N183 Chronic kidney disease, stage 3 unspecified: Secondary | ICD-10-CM

## 2014-06-20 DIAGNOSIS — E890 Postprocedural hypothyroidism: Secondary | ICD-10-CM

## 2014-06-20 DIAGNOSIS — N289 Disorder of kidney and ureter, unspecified: Secondary | ICD-10-CM

## 2014-06-20 DIAGNOSIS — IMO0001 Reserved for inherently not codable concepts without codable children: Secondary | ICD-10-CM

## 2014-06-20 LAB — COMPREHENSIVE METABOLIC PANEL
ALBUMIN: 3.7 g/dL (ref 3.5–5.2)
ALK PHOS: 61 U/L (ref 39–117)
ALT: 14 U/L (ref 0–35)
AST: 28 U/L (ref 0–37)
BILIRUBIN TOTAL: 0.3 mg/dL (ref 0.2–1.2)
BUN: 50 mg/dL — AB (ref 6–23)
CO2: 25 mEq/L (ref 19–32)
Calcium: 9.2 mg/dL (ref 8.4–10.5)
Chloride: 105 mEq/L (ref 96–112)
Creatinine, Ser: 1.8 mg/dL — ABNORMAL HIGH (ref 0.4–1.2)
GFR: 28.41 mL/min — ABNORMAL LOW (ref >60.00)
Glucose, Bld: 135 mg/dL — ABNORMAL HIGH (ref 70–99)
POTASSIUM: 4.5 meq/L (ref 3.5–5.1)
Sodium: 139 mEq/L (ref 135–145)
Total Protein: 6.4 g/dL (ref 6.0–8.3)

## 2014-06-20 LAB — TSH: TSH: 0.67 u[IU]/mL (ref 0.35–4.50)

## 2014-06-20 LAB — T4, FREE: Free T4: 0.93 ng/dL (ref 0.60–1.60)

## 2014-06-20 NOTE — Progress Notes (Signed)
Patient ID: [name], female   DOB: 11-08-1915, 80 y.o.   MRN: [id number]   Reason for Appointment: Legs swelling  History of Present Illness   1. Lower leg EDEMA: She is  having less swelling after standing date Lasix. She was told to take this as needed but she is taking this every day. She thinks that she is uncomfortable having the frequent urination with the Lasix She is trying to wear elastic stockings but does not think this is generally helpful She is taking Lyrica and has been doing so for several years and not clear if this is playing a role   2. Type 2 DIABETES MELITUS  She has been on low dose NovoLog at mealtimes along with Amaryl for several years with good control Has not required basal insulin except when she was getting steroids Her blood sugars are reasonably well-controlled for her age recently However not clear why her A1c stays around 8%  She is usually not checking her blood sugars after supper  She does occasionally eat sweets, eating out a lot again and usually compliant with taking insulin at mealtimes  Insulin: She will take 2-4 units of NovoLog insulin when eating   Blood sugars from her Accu check download:  Sugars averaging about 136 FASTING Average blood sugar afternoons and evenings is about 140 periodic readings around 160-170, highest 198 Not checking after supper Overall as an average 136   Wt Readings from Last 3 Encounters:  06/20/14 140 lb 9.6 oz (63.776 kg)  05/09/14 141 lb (63.957 kg)  02/09/14 146 lb 6.4 oz (66.407 kg)   Lab Results  Component Value Date   HGBA1C 8.3* 05/09/2014   HGBA1C 8.1* 02/08/2014   HGBA1C 7.8* 11/04/2013   Lab Results  Component Value Date   MICROALBUR 1.7 02/08/2014   LDLCALC 57 02/08/2014   CREATININE 1.6* 05/09/2014    3. HYPOTHYROIDISM: She has been on thyroid supplement long-term, currently taking 88 mcg and is compliant with this. Occasionally may forget to take this She was told to reduce the dose on  her last visit but prescription was not changed. She gets mail-order supply Not complaining of any palpitations or unusual fatigue   Lab Results  Component Value Date   TSH 0.19* 05/09/2014       Medication List       This list is accurate as of: 06/20/14  1:57 PM.  Always use your most recent med list.               ACCU-CHEK AVIVA PLUS test strip  Generic drug:  glucose blood  3 (three) times daily.     ACCU-CHEK FASTCLIX LANCET Kit     aspirin EC 81 MG tablet  Take 81 mg by mouth daily.     CALTRATE 600+D 600-400 MG-UNIT per tablet  Generic drug:  Calcium Carbonate-Vitamin D  Take 1 tablet by mouth daily.     diclofenac sodium 1 % Gel  Commonly known as:  VOLTAREN  Apply 2 g topically 4 (four) times daily.     Diclofenac Sodium 3 % Gel  Place 15 mg onto the skin 2 (two) times daily. Use small amount on knee     estazolam 1 MG tablet  Commonly known as:  PROSOM  Take 1 mg by mouth at bedtime.     FLUVIRIN IM     furosemide 20 MG tablet  Commonly known as:  LASIX  Take 1 tablet (20 mg total) by mouth  daily.     glimepiride 2 MG tablet  Commonly known as:  AMARYL  TAKE 1 TABLET BY MOUTH DAILY BEFORE BREAKFAST.     insulin aspart 100 UNIT/ML injection  Commonly known as:  NOVOLOG FLEXPEN  Inject 2 Units into the skin 3 (three) times daily before meals. If blood sugar is over 200, take 5 units and if they go over 300, take 6 units at that meal.     levothyroxine 88 MCG tablet  Commonly known as:  SYNTHROID, LEVOTHROID  Take 1 tablet daily     multivitamin with minerals Tabs tablet  Take 1 tablet by mouth daily.     NOVOFINE 32G X 6 MM Misc  Generic drug:  Insulin Pen Needle     omeprazole 20 MG capsule  Commonly known as:  PRILOSEC  Take 1 capsule (20 mg total) by mouth daily.     pravastatin 20 MG tablet  Commonly known as:  PRAVACHOL  Take 1 tablet (20 mg total) by mouth daily.     pregabalin 75 MG capsule  Commonly known as:  LYRICA  Take 1  capsule (75 mg total) by mouth 2 (two) times daily.     psyllium 58.6 % powder  Commonly known as:  METAMUCIL  Take 1 packet by mouth as directed.     traMADol 50 MG tablet  Commonly known as:  ULTRAM  Take 1 tablet (50 mg total) by mouth 2 (two) times daily.        Allergies:  Allergies  Allergen Reactions  . Other     Ground red pepper lotion to rub on her knee caused SOB    Past Medical History  Diagnosis Date  . Diabetes mellitus without complication   . Tuberculosis     During the Great Depression  . Hypothyroidism     Past Surgical History  Procedure Laterality Date  . Hip fracture surgery      both hips    Family History  Problem Relation Age of Onset  . Diabetes Father     Social History:  reports that she has never smoked. She does not have any smokeless tobacco history on file. She reports that she does not drink alcohol or use illicit drugs.  Review of Systems:  LIPIDS: She was restarted on a statin drug, previously had been on Lipitor. She is tolerating pravastatin 20 mg for cardiovascular prophylaxis  Lab Results  Component Value Date   CHOL 141 02/08/2014   HDL 71.60 02/08/2014   LDLCALC 57 02/08/2014   TRIG 62.0 02/08/2014   CHOLHDL 2 02/08/2014    NEUROPATHY: She has had long-standing neuropathy with numbness and paresthesiae, better with Lyrica. She tried to cut back to once a day but had more symptoms at night and sleeps better with them it does  Mild chronic kidney disease: This is likely related to nephrosclerosis and creatinine may go up diuretics Also her blood pressure appears to be lower than usual today  Lab Results  Component Value Date   CREATININE 1.6* 05/09/2014    Back/hip/leg pain: She was told by other incision that she had spinal stenosis and did get some relief temporarily with epidural steroid       Examination:   BP 108/38  Pulse 74  Temp(Src) 98.5 F (36.9 C)  Resp 14  Ht _0  (1.6 m)  Wt 140 lb 9.6 oz (63.776  kg)  BMI 24.91 kg/m2  SpO2 94%  Body mass index is 24.91 kg/(m^2).  She appears well-groomed and looking good for her age  25+ left ankle edema present, trace on the right, is wearing elastic stockings  ASSESSMENT/ PLAN:    Venous edema of the legs: Controlled with Lasix and she still needs to use elastic stockings as discussed today However she is not tolerating Lasix on a daily basis and to take it only as needed, probably every other day to start with Also needs elevation of her legs  Diabetes type 2   The patient's diabetes control appears to be reasonably well-controlled for her age and home readings are excellent She may be having more high readings after her evening meal since her A1c consistently relatively high However because of her age she was reassured that she did not have to be  strict with her diet  To continue Amaryl as it appears to be helping her fasting readings No hypoglycemia with low-dose insulin recently To check more readings after supper  RENAL insufficiency: Not clear if etiology, this may be related to nephrosclerosis. Creatinine needs to be checked since she is taking Lasix and blood pressure is relatively low indicating possibly some overdiuresis  HYPOTHYROIDISM: Her TSH was low on her last visit and she still is on the same dose She isn't symptomatic but advised her to reduce her dose by 1 tablet a week and next prescription will be for 75 mcg  Hyperlipidemia: Has been mild and she is tolerating pravastatin, will continue  Anemia: Will need followup assessment today  Weight loss: She has a stable weight now, probably not related to organic etiology  Total visit time 25 minutes  KUMAR,AJAY 06/20/2014, 1:57 PM   Creatinine 1.8, to reduce Lasix, TSH okay   Office Visit on 06/20/2014  Component Date Value Ref Range Status  . TSH 06/20/2014 0.67  0.35 - 4.50 uIU/mL Final  . Free T4 06/20/2014 0.93  0.60 - 1.60 ng/dL Final  . Sodium 06/20/2014  139  135 - 145 mEq/L Final  . Potassium 06/20/2014 4.5  3.5 - 5.1 mEq/L Final  . Chloride 06/20/2014 105  96 - 112 mEq/L Final  . CO2 06/20/2014 25  19 - 32 mEq/L Final  . Glucose, Bld 06/20/2014 135* 70 - 99 mg/dL Final  . BUN 06/20/2014 50* 6 - 23 mg/dL Final  . Creatinine, Ser 06/20/2014 1.8* 0.4 - 1.2 mg/dL Final  . Total Bilirubin 06/20/2014 0.3  0.2 - 1.2 mg/dL Final  . Alkaline Phosphatase 06/20/2014 61  39 - 117 U/L Final  . AST 06/20/2014 28  0 - 37 U/L Final  . ALT 06/20/2014 14  0 - 35 U/L Final  . Total Protein 06/20/2014 6.4  6.0 - 8.3 g/dL Final  . Albumin 06/20/2014 3.7  3.5 - 5.2 g/dL Final  . Calcium 06/20/2014 9.2  8.4 - 10.5 mg/dL Final  . GFR 06/20/2014 28.41* >60.00 mL/min Final

## 2014-06-20 NOTE — Patient Instructions (Addendum)
Reduce furosemide to every 2 days  Please check blood sugars at least half the time about 2 hours after any meal and times per week on waking up. Please bring blood sugar monitor to each visit  See Dr Paulla Dolly  Skip 1 pill per week of Synthroid

## 2014-06-20 NOTE — Progress Notes (Signed)
Quick Note:  Please let patient know that the thyroid level is okay, to reduce Synthroid by only half tablet once a week Kidney test slightly worse, to take Lasix every other day as directed ______

## 2014-08-09 ENCOUNTER — Other Ambulatory Visit: Payer: Self-pay | Admitting: Endocrinology

## 2014-08-10 ENCOUNTER — Other Ambulatory Visit: Payer: Self-pay | Admitting: Endocrinology

## 2014-08-15 ENCOUNTER — Other Ambulatory Visit (INDEPENDENT_AMBULATORY_CARE_PROVIDER_SITE_OTHER): Payer: Medicare HMO

## 2014-08-15 DIAGNOSIS — E890 Postprocedural hypothyroidism: Secondary | ICD-10-CM

## 2014-08-15 DIAGNOSIS — N183 Chronic kidney disease, stage 3 unspecified: Secondary | ICD-10-CM

## 2014-08-15 DIAGNOSIS — E1165 Type 2 diabetes mellitus with hyperglycemia: Secondary | ICD-10-CM

## 2014-08-15 DIAGNOSIS — IMO0001 Reserved for inherently not codable concepts without codable children: Secondary | ICD-10-CM

## 2014-08-15 LAB — BASIC METABOLIC PANEL
BUN: 31 mg/dL — ABNORMAL HIGH (ref 6–23)
CHLORIDE: 109 meq/L (ref 96–112)
CO2: 23 meq/L (ref 19–32)
Calcium: 8.9 mg/dL (ref 8.4–10.5)
Creatinine, Ser: 1.4 mg/dL — ABNORMAL HIGH (ref 0.4–1.2)
GFR: 36.98 mL/min — ABNORMAL LOW (ref >60.00)
Glucose, Bld: 116 mg/dL — ABNORMAL HIGH (ref 70–99)
Potassium: 4.2 mEq/L (ref 3.5–5.1)
SODIUM: 142 meq/L (ref 135–145)

## 2014-08-15 LAB — T4, FREE: FREE T4: 1.19 ng/dL (ref 0.60–1.60)

## 2014-08-15 LAB — HEMOGLOBIN A1C: Hgb A1c MFr Bld: 8.1 % — ABNORMAL HIGH (ref 4.6–6.5)

## 2014-08-15 LAB — TSH: TSH: 2.27 u[IU]/mL (ref 0.35–4.50)

## 2014-08-17 ENCOUNTER — Encounter: Payer: Self-pay | Admitting: Endocrinology

## 2014-08-19 ENCOUNTER — Encounter: Payer: Self-pay | Admitting: Endocrinology

## 2014-08-19 ENCOUNTER — Ambulatory Visit (INDEPENDENT_AMBULATORY_CARE_PROVIDER_SITE_OTHER): Payer: Medicare HMO | Admitting: Endocrinology

## 2014-08-19 VITALS — BP 142/60 | HR 76 | Temp 98.7°F | Resp 14 | Ht 63.0 in | Wt 143.4 lb

## 2014-08-19 DIAGNOSIS — M2012 Hallux valgus (acquired), left foot: Secondary | ICD-10-CM

## 2014-08-19 DIAGNOSIS — IMO0001 Reserved for inherently not codable concepts without codable children: Secondary | ICD-10-CM

## 2014-08-19 DIAGNOSIS — R609 Edema, unspecified: Secondary | ICD-10-CM

## 2014-08-19 DIAGNOSIS — M21619 Bunion of unspecified foot: Secondary | ICD-10-CM

## 2014-08-19 DIAGNOSIS — E1142 Type 2 diabetes mellitus with diabetic polyneuropathy: Secondary | ICD-10-CM

## 2014-08-19 DIAGNOSIS — E1165 Type 2 diabetes mellitus with hyperglycemia: Secondary | ICD-10-CM

## 2014-08-19 DIAGNOSIS — R600 Localized edema: Secondary | ICD-10-CM

## 2014-08-19 DIAGNOSIS — N289 Disorder of kidney and ureter, unspecified: Secondary | ICD-10-CM

## 2014-08-19 NOTE — Progress Notes (Signed)
Patient ID: [name], female   DOB: 30-Apr-1915, 80 y.o.   MRN: [id number]   Reason for Appointment: Multiple problems  History of Present Illness   1. Lower leg EDEMA: [name] is having intermittent swelling of her legs. [name] was told to take her Lasix every other day because of increasing creatinine when [name] was taking it daily. [name] has recently taken the Lasix mostly as needed. [name] thinks her swelling is more when [name] is more mobile and going out of the house and has not taken any for 2 days. Does not like to wear the elastic stockings consistently  Renal function is better now [name] is taking Lyrica and has been doing so for several years   2. Type 2 DIABETES MELITUS  [name] has been on low dose NovoLog at mealtimes along with Amaryl once a day for several years with fairly good control Her fasting blood sugars are fairly consistent and near normal her Amaryl Postprandial readings are variable and dependent on her diet. Has been checking blood sugars at various times of the day Her blood sugars are reasonably well-controlled for her age recently Her A1c stays around 8% [name] does occasionally eat sweets, eating out frequently and usually compliant with taking insulin at mealtimes Has had hypoglycemia only once before supper, [name] thinks [name] may have not eating consistently during the day which was her birthday  Insulin: Usually 2-4 units of NovoLog insulin when eating   Blood sugars from her Accu check download:  PREMEAL Breakfast  midday  Dinner Bedtime Overall  Glucose range:  83-154   119-213   53-193   122-232    Mean/median:  124   171    173   146    Wt Readings from Last 3 Encounters:  08/19/14 143 lb 6.4 oz (65.046 kg)  06/20/14 140 lb 9.6 oz (63.776 kg)  05/09/14 141 lb (63.957 kg)   Lab Results  Component Value Date   HGBA1C 8.1* 08/15/2014   HGBA1C 8.3* 05/09/2014   HGBA1C 8.1* 02/08/2014   Lab Results  Component Value Date   MICROALBUR 1.7 02/08/2014   LDLCALC 57  02/08/2014   CREATININE 1.4* 08/15/2014    3. HYPOTHYROIDISM: [name] has been on thyroid supplement long-term, currently taking 88 mcg, 6/7days a week  and is compliant with this.  [name] was told to reduce the dose  by half a tablet a week on her last visit but [name] cannot rate the tablet in half easily [name] getsher prescription from mail-order supply Not complaining of  unusual fatigue   Lab Results  Component Value Date   TSH 2.27 08/15/2014       Medication List       This list is accurate as of: 08/19/14  1:16 PM.  Always use your most recent med list.               ACCU-CHEK AVIVA PLUS test strip  Generic drug:  glucose blood  3 (three) times daily.     ACCU-CHEK FASTCLIX LANCET Kit     aspirin EC 81 MG tablet  Take 81 mg by mouth daily.     CALTRATE 600+D 600-400 MG-UNIT per tablet  Generic drug:  Calcium Carbonate-Vitamin D  Take 1 tablet by mouth daily.     diclofenac sodium 1 % Gel  Commonly known as:  VOLTAREN  Apply 2 g topically 4 (four) times daily.     Diclofenac Sodium 3 % Gel  Place 15 mg onto  the skin 2 (two) times daily. Use small amount on knee     estazolam 1 MG tablet  Commonly known as:  PROSOM  Take 1 mg by mouth at bedtime.     FLUVIRIN IM     furosemide 20 MG tablet  Commonly known as:  LASIX  Take 1 tablet (20 mg total) by mouth daily.     glimepiride 2 MG tablet  Commonly known as:  AMARYL  TAKE 1 TABLET BY MOUTH DAILY BEFORE BREAKFAST     insulin aspart 100 UNIT/ML injection  Commonly known as:  NOVOLOG FLEXPEN  Inject 2 Units into the skin 3 (three) times daily before meals. If blood sugar is over 200, take 5 units and if they go over 300, take 6 units at that meal.     levothyroxine 88 MCG tablet  Commonly known as:  SYNTHROID, LEVOTHROID  Take 1 tablet daily     multivitamin with minerals Tabs tablet  Take 1 tablet by mouth daily.     NOVOFINE 32G X 6 MM Misc  Generic drug:  Insulin Pen Needle     omeprazole 20 MG capsule   Commonly known as:  PRILOSEC  Take 1 capsule (20 mg total) by mouth daily.     pravastatin 20 MG tablet  Commonly known as:  PRAVACHOL  Take 1 tablet (20 mg total) by mouth daily.     pregabalin 75 MG capsule  Commonly known as:  LYRICA  Take 1 capsule (75 mg total) by mouth 2 (two) times daily.     psyllium 58.6 % powder  Commonly known as:  METAMUCIL  Take 1 packet by mouth as directed.     traMADol 50 MG tablet  Commonly known as:  ULTRAM  TAKE 1 TABLET BY MOUTH TWICE DAILY        Allergies:  Allergies  Allergen Reactions  . Other     Ground red pepper lotion to rub on her knee caused SOB    Past Medical History  Diagnosis Date  . Diabetes mellitus without complication   . Tuberculosis     During the Great Depression  . Hypothyroidism     Past Surgical History  Procedure Laterality Date  . Hip fracture surgery      both hips    Family History  Problem Relation Age of Onset  . Diabetes Father     Social History:  reports that [name] has never smoked. [name] does not have any smokeless tobacco history on file. [name] reports that [name] does not drink alcohol or use illicit drugs.  Review of Systems:  LIPIDS: [name]  has been taking  pravastatin, previously had been on Lipitor. [name] is tolerating  this quite well, lipids have been excellent Liver functions are normal  Lab Results  Component Value Date   CHOL 141 02/08/2014   HDL 71.60 02/08/2014   LDLCALC 57 02/08/2014   TRIG 62.0 02/08/2014   CHOLHDL 2 02/08/2014    NEUROPATHY: [name] has had long-standing neuropathy with numbness and paresthesiae, better with Lyrica.   Mild chronic kidney disease: This is likely related to nephrosclerosis and creatinine may go up  with diuretics Also her blood pressure appears well controlled  Lab Results  Component Value Date   CREATININE 1.4* 08/15/2014   No lightheadedness  Back/hip/leg pain: [name]  has fairly good control of symptoms with tramadol Apparently has spinal  stenosis and did get some relief temporarily with epidural steroid  Examination:   BP 142/60  Pulse 76  Temp(Src) 98.7 F (37.1 C)  Resp 14  Ht '5\' 3"'  (1.6 m)  Wt 143 lb 6.4 oz (65.046 kg)  BMI 25.41 kg/m2  SpO2 95%  Body mass index is 25.41 kg/(m^2).   [name] is alert and  looking  well for her age  24-2+ left ankle edema present,1+  on the right  Diabetic foot exam shows decreased monofilament sensation in the toes and plantar surfaces, no skin lesions or ulcers on the feet and normal pedal pulses [name] has a bunion on her left great toe with some redness and callus formation but no ulceration or warmth  ASSESSMENT/ PLAN:    Venous edema of the legs: Controlled with Lasix as needed  and [name] still needs to use elastic stockings as much is possible  [name] will try to avoid taking Lasix every day as it tends to worsen her renal function Encouraged her to elevate her legs when sitting    Diabetes type 2   The patient's diabetes control appears to be reasonably well-controlled for her age and home readings are  overall averaging 146 tendency to high postprandial readings whenever [name] is getting more carbohydrate or large portions [name] continues to have an A1c around 8% which is adequate for her age  To  reduce Amaryl  to half a tablet as her fasting readings are occasionally low normal and [name] did have an episode of mild hypoglycemia in the afternoon recently   RENAL insufficiency:  partly  related to nephrosclerosis And partly to prerenal dysfunction when [name] takes Lasix regularly.   HYPOTHYROIDISM: Her TSH  is more normal with current regimen of   Synthroid 88 mcg, 6 days a week and [name] will continue this [name] can use 75 mcg from her next prescription    Hyperlipidemia: Has been mild and [name] is  taking pravastatin for cardiovascular prophylaxis  Anemia: Will need followup assessment  periodically   Neuropathy: [name] has mild persistent sensory loss, discussed foot care, to  continue Lyrica   [name] will be seeing a podiatrist for general foot care and discussion about her bunion, does not think [name] is a surgical candidate. However given her prescription for diabetic shoes  Counseling time over 50% of today's 25 minute visit  Gaston Dase 08/19/2014, 1:16 PM      No visits with results within 1 Day(s) from this visit. Latest known visit with results is:  Appointment on 08/15/2014  Component Date Value Ref Range Status  . TSH 08/15/2014 2.27  0.35 - 4.50 uIU/mL Final  . Free T4 08/15/2014 1.19  0.60 - 1.60 ng/dL Final  . Sodium 08/15/2014 142  135 - 145 mEq/L Final  . Potassium 08/15/2014 4.2  3.5 - 5.1 mEq/L Final  . Chloride 08/15/2014 109  96 - 112 mEq/L Final  . CO2 08/15/2014 23  19 - 32 mEq/L Final  . Glucose, Bld 08/15/2014 116* 70 - 99 mg/dL Final  . BUN 08/15/2014 31* 6 - 23 mg/dL Final  . Creatinine, Ser 08/15/2014 1.4* 0.4 - 1.2 mg/dL Final  . Calcium 08/15/2014 8.9  8.4 - 10.5 mg/dL Final  . GFR 08/15/2014 36.98* >60.00 mL/min Final  . Hemoglobin A1C 08/15/2014 8.1* 4.6 - 6.5 % Final   Glycemic Control Guidelines for People with Diabetes:Non Diabetic:  <6%Goal of Therapy: <7%Additional Action Suggested:  >8%

## 2014-08-19 NOTE — Patient Instructions (Addendum)
Reduce GLIMEPERIDE to 1/2 tab in ams only  Continue Pravastatin daily for cholesterol  Check morning sugar only 2 times a week  Keep skipping Synthroid on Sundays

## 2014-09-02 ENCOUNTER — Encounter: Payer: Self-pay | Admitting: Podiatry

## 2014-09-02 ENCOUNTER — Ambulatory Visit (INDEPENDENT_AMBULATORY_CARE_PROVIDER_SITE_OTHER): Payer: Commercial Managed Care - HMO

## 2014-09-02 ENCOUNTER — Ambulatory Visit (INDEPENDENT_AMBULATORY_CARE_PROVIDER_SITE_OTHER): Payer: Commercial Managed Care - HMO | Admitting: Podiatry

## 2014-09-02 VITALS — BP 116/47 | HR 72 | Resp 18

## 2014-09-02 DIAGNOSIS — R52 Pain, unspecified: Secondary | ICD-10-CM

## 2014-09-02 DIAGNOSIS — M2040 Other hammer toe(s) (acquired), unspecified foot: Secondary | ICD-10-CM

## 2014-09-02 DIAGNOSIS — M2010 Hallux valgus (acquired), unspecified foot: Secondary | ICD-10-CM

## 2014-09-02 DIAGNOSIS — E1149 Type 2 diabetes mellitus with other diabetic neurological complication: Secondary | ICD-10-CM

## 2014-09-02 DIAGNOSIS — M21619 Bunion of unspecified foot: Secondary | ICD-10-CM

## 2014-09-02 DIAGNOSIS — L84 Corns and callosities: Secondary | ICD-10-CM

## 2014-09-02 DIAGNOSIS — B351 Tinea unguium: Secondary | ICD-10-CM

## 2014-09-02 NOTE — Progress Notes (Signed)
   Subjective:    Patient ID: [name], female    DOB: 1915/12/06, 80 y.o.   MRN: [id number]  HPI  80 year old female presents the office today with complaints of left foot bunion and possible ingrown toenail on her left foot. Also states that her other nails are painful with shoe gear. She states that she previously had noticed some drainage over her left bunion and it was inflamed. States that it has resolved although it does become painful at times. Denies any systemic complaints such as fevers, chills, nausea, vomiting. She has been diabetic since 1974 her last blood sugar was 180. She does that she has neuropathy. Denies any claudication symptoms. No other complaints.    Review of Systems  HENT: Positive for hearing loss.   All other systems reviewed and are negative.      Objective:   Physical Exam AAO x3, NAD DP/PT pulses palpable b/l. CRT < 3sec The sensation decreased with Simms Weinstein monofilament, vibratory sensation decreased. Bilateral HAV with left > right. There is hyperkeratotic tissue over the left medial first metatarsal head overlying the bunion deformity. There is no open lesions. Upon debridement there was no open lesions no drainage. No surrounding erythema, ascending cellulitis, malodor.  Left hallux with slight incurvation of the medial and lateral nail borders. Mild discomfort along the distal aspect of the nail borders but no pain along the proximal nail borders. No surrounding erythema, drainage. Remaining nails elongated, dystrophic, discolored. Hammertoe contractures b/l with small hyperkerotic lesion distal left 2nd digit. ROM WNL No open lesions No leg pain/warmth/edema.        Assessment & Plan:  80 year old female with symptomatic onychomycosis, severe HAV, symptomatic hyperkeratotic lesions. -Treatment options discussed including alternatives, risks, complications. -X-rays were obtained. No acute fracture, cortical destruction to suggest  osteomyelitis -Nails sharply debrided J47 without complications. Left hallux nail incurvated borders debrided the patient comfort. Discussed invasive procedure versus conservative debridement patient wanted to proceed with conservative debridement of the nail.  -Hyperkeratotic lesions left second digit in medial first metatarsal head sharply debrided without complications. -Dispensed foam pads to help offload the bunion. -Started paperwork to get diabetic shoes precertified. -Followup in 3 months or sooner if any problems are to arise or any change in symptoms. Call any questions or concerns.

## 2014-09-02 NOTE — Patient Instructions (Addendum)
Diabetes and Foot Care Diabetes may cause you to have problems because of poor blood supply (circulation) to your feet and legs. This may cause the skin on your feet to become thinner, break easier, and heal more slowly. Your skin may become dry, and the skin may peel and crack. You may also have nerve damage in your legs and feet causing decreased feeling in them. You may not notice minor injuries to your feet that could lead to infections or more serious problems. Taking care of your feet is one of the most important things you can do for yourself.  HOME CARE INSTRUCTIONS  Wear shoes at all times, even in the house. Do not go barefoot. Bare feet are easily injured.  Check your feet daily for blisters, cuts, and redness. If you cannot see the bottom of your feet, use a mirror or ask someone for help.  Wash your feet with warm water (do not use hot water) and mild soap. Then pat your feet and the areas between your toes until they are completely dry. Do not soak your feet as this can dry your skin.  Apply a moisturizing lotion or petroleum jelly (that does not contain alcohol and is unscented) to the skin on your feet and to dry, brittle toenails. Do not apply lotion between your toes.  Trim your toenails straight across. Do not dig under them or around the cuticle. File the edges of your nails with an emery board or nail file.  Do not cut corns or calluses or try to remove them with medicine.  Wear clean socks or stockings every day. Make sure they are not too tight. Do not wear knee-high stockings since they may decrease blood flow to your legs.  Wear shoes that fit properly and have enough cushioning. To break in new shoes, wear them for just a few hours a day. This prevents you from injuring your feet. Always look in your shoes before you put them on to be sure there are no objects inside.  Do not cross your legs. This may decrease the blood flow to your feet.  If you find a minor scrape,  cut, or break in the skin on your feet, keep it and the skin around it clean and dry. These areas may be cleansed with mild soap and water. Do not cleanse the area with peroxide, alcohol, or iodine.  When you remove an adhesive bandage, be sure not to damage the skin around it.  If you have a wound, look at it several times a day to make sure it is healing.  Do not use heating pads or hot water bottles. They may burn your skin. If you have lost feeling in your feet or legs, you may not know it is happening until it is too late.  Make sure your health care provider performs a complete foot exam at least annually or more often if you have foot problems. Report any cuts, sores, or bruises to your health care provider immediately. SEEK MEDICAL CARE IF:   You have an injury that is not healing.  You have cuts or breaks in the skin.  You have an ingrown nail.  You notice redness on your legs or feet.  You feel burning or tingling in your legs or feet.  You have pain or cramps in your legs and feet.  Your legs or feet are numb.  Your feet always feel cold. SEEK IMMEDIATE MEDICAL CARE IF:   There is increasing redness,   swelling, or pain in or around a wound.  There is a red line that goes up your leg.  Pus is coming from a wound.  You develop a fever or as directed by your health care provider.  You notice a bad smell coming from an ulcer or wound. Document Released: 12/13/2000 Document Revised: 08/18/2013 Document Reviewed: 05/25/2013 [facility] Patient Information 2015 [place], [place]. This information is not intended to replace advice given to you by your health care provider. Make sure you discuss any questions you have with your health care provider.  Monitor for any signs/symptoms of infection. Call the office immediately if any occur or go directly to the emergency room. Call with any questions/concerns.

## 2014-09-19 ENCOUNTER — Other Ambulatory Visit: Payer: Self-pay | Admitting: *Deleted

## 2014-09-19 MED ORDER — PREGABALIN 75 MG PO CAPS: 75.0000 mg | ORAL_CAPSULE | Freq: Two times a day (BID) | ORAL | Status: DC

## 2014-09-20 ENCOUNTER — Other Ambulatory Visit: Payer: Self-pay | Admitting: *Deleted

## 2014-09-20 MED ORDER — INSULIN ASPART 100 UNIT/ML ~~LOC~~ SOLN: SUBCUTANEOUS | Status: DC

## 2014-09-29 ENCOUNTER — Encounter: Payer: Self-pay | Admitting: *Deleted

## 2014-10-25 ENCOUNTER — Telehealth: Payer: Self-pay | Admitting: Endocrinology

## 2014-10-25 ENCOUNTER — Telehealth: Payer: Self-pay | Admitting: Podiatry

## 2014-10-25 NOTE — Telephone Encounter (Signed)
Patient was denied her diabetic shoes, please advise on what to do

## 2014-10-25 NOTE — Telephone Encounter (Signed)
Patient called stating she did not want to make an appointment to be measured for diabetic shoes stating that she received a 3 page letter in the mail today saying she was denied and did not want to spend $275.00. I asked Jocelyn Lamer to speak to the lady and Delydia told Jocelyn Lamer it is showing in [employer] that they were approved. I got back onto the phone and told the patient but she still did not want to make an appointment to be measured for diabetic shoes. JMS

## 2014-10-26 NOTE — Telephone Encounter (Signed)
Please see below.

## 2014-10-26 NOTE — Telephone Encounter (Signed)
I had approved the shoes, not sure what the problem is, please clarify

## 2014-10-27 NOTE — Telephone Encounter (Signed)
Patient knew you had approved her shoes, but she said the company had denied them.  She was a little confusing and didn't have the letter in front of her that said why they were denied.

## 2014-11-08 ENCOUNTER — Other Ambulatory Visit: Payer: Self-pay | Admitting: Endocrinology

## 2014-11-14 ENCOUNTER — Other Ambulatory Visit: Payer: Self-pay | Admitting: Endocrinology

## 2014-11-21 ENCOUNTER — Other Ambulatory Visit: Payer: Self-pay | Admitting: *Deleted

## 2014-11-21 ENCOUNTER — Ambulatory Visit (INDEPENDENT_AMBULATORY_CARE_PROVIDER_SITE_OTHER): Payer: Commercial Managed Care - HMO | Admitting: Endocrinology

## 2014-11-21 ENCOUNTER — Encounter: Payer: Self-pay | Admitting: Endocrinology

## 2014-11-21 VITALS — BP 128/50 | HR 66 | Temp 97.9°F | Resp 14 | Ht 63.0 in | Wt 136.4 lb

## 2014-11-21 DIAGNOSIS — IMO0002 Reserved for concepts with insufficient information to code with codable children: Secondary | ICD-10-CM

## 2014-11-21 DIAGNOSIS — N183 Chronic kidney disease, stage 3 unspecified: Secondary | ICD-10-CM

## 2014-11-21 DIAGNOSIS — E1142 Type 2 diabetes mellitus with diabetic polyneuropathy: Secondary | ICD-10-CM

## 2014-11-21 DIAGNOSIS — E1165 Type 2 diabetes mellitus with hyperglycemia: Secondary | ICD-10-CM

## 2014-11-21 DIAGNOSIS — E063 Autoimmune thyroiditis: Secondary | ICD-10-CM

## 2014-11-21 DIAGNOSIS — Z23 Encounter for immunization: Secondary | ICD-10-CM

## 2014-11-21 DIAGNOSIS — E038 Other specified hypothyroidism: Secondary | ICD-10-CM

## 2014-11-21 MED ORDER — TRAMADOL HCL 50 MG PO TABS: 50.0000 mg | ORAL_TABLET | Freq: Two times a day (BID) | ORAL | Status: DC

## 2014-11-21 MED ORDER — GLUCOSE BLOOD VI STRP: ORAL_STRIP | Status: DC

## 2014-11-21 MED ORDER — GLIMEPIRIDE 2 MG PO TABS: ORAL_TABLET | ORAL | Status: DC

## 2014-11-21 MED ORDER — INSULIN PEN NEEDLE 32G X 6 MM MISC: Status: DC

## 2014-11-21 NOTE — Patient Instructions (Addendum)
GLIMEPERIDE additional 1/2 tab at dinner also  Call if sugars stay over 200  Take furosemide only if feet swell

## 2014-11-21 NOTE — Progress Notes (Signed)
Patient ID: [name], female   DOB: 25-Dec-1915, 80 y.o.   MRN: [id number]   Reason for Appointment: Multiple problems  History of Present Illness   1. Lower leg EDEMA: She is having intermittent swelling of her legs. She was told to take her Lasix every other day because of increasing creatinine when she was taking it daily. She has recently taken the Lasix mostly as needed. She thinks her swelling is more when she is more mobile and going out of the house and has not taken any for 2 days. Does not like to wear the elastic stockings consistently  Renal function is better now She is taking Lyrica and has been doing so for several years   2. Type 2 DIABETES MELITUS  She has been on low dose NovoLog at mealtimes along with Amaryl once a day for several years with fairly good control Her fasting blood sugars previously have been fairly good with taking low dose Amaryl She does tend to have some postprandial hyperglycemia but because of her age she has not been asked to increase her insulin However more recently her blood sugars appear to be significantly higher especially in the mornings Her average glucose is 186 compared to 146 on her last visit  She has not changed her diet significantly but has lost some weight She thinks she is eating somewhat less at times although periodically will still go out to eat and may have some desserts Her A1c stays around 8% And recent level is not available  No recent hypoglycemia  Insulin: Usually 2-4 units of NovoLog insulin when eating   Blood sugars from her Accu check download:  PREMEAL Breakfast Lunch Dinner Bedtime Overall  Glucose range:  131-205   204-307   98-259   110    Mean/median:  175   222   181    186     Wt Readings from Last 3 Encounters:  11/21/14 136 lb 6.4 oz (61.871 kg)  08/19/14 143 lb 6.4 oz (65.046 kg)  06/20/14 140 lb 9.6 oz (63.776 kg)   Lab Results  Component Value Date   HGBA1C 8.1* 08/15/2014   HGBA1C 8.3*  05/09/2014   HGBA1C 8.1* 02/08/2014   Lab Results  Component Value Date   MICROALBUR 1.7 02/08/2014   LDLCALC 57 02/08/2014   CREATININE 1.4* 08/15/2014    3. HYPOTHYROIDISM:  She has been on thyroid supplement long-term, currently taking 88 mcg, 6/7 days a week  and is compliant with this.  Not complaining of  unusual fatigue   Lab Results  Component Value Date   TSH 2.27 08/15/2014       Medication List       This list is accurate as of: 11/21/14  2:16 PM.  Always use your most recent med list.               ACCU-CHEK FASTCLIX LANCET Kit     aspirin EC 81 MG tablet  Take 81 mg by mouth daily.     CALTRATE 600+D 600-400 MG-UNIT per tablet  Generic drug:  Calcium Carbonate-Vitamin D  Take 1 tablet by mouth daily.     diclofenac sodium 1 % Gel  Commonly known as:  VOLTAREN  Apply 2 g topically 4 (four) times daily.     Diclofenac Sodium 3 % Gel  Place 15 mg onto the skin 2 (two) times daily. Use small amount on knee     estazolam 1 MG tablet  Commonly known  as:  PROSOM  Take 1 mg by mouth at bedtime.     FLUVIRIN IM     furosemide 20 MG tablet  Commonly known as:  LASIX  Take 1 tablet (20 mg total) by mouth daily.     glimepiride 2 MG tablet  Commonly known as:  AMARYL  TAKE 1 TABLET BY MOUTH DAILY BEFORE BREAKFAST     glucose blood test strip  Commonly known as:  ACCU-CHEK AVIVA PLUS  Use to check blood sugar three times  a day     insulin aspart 100 UNIT/ML injection  Commonly known as:  novoLOG  If blood sugar is over 200, take 5 units and if they go over 300, take 6 units at that meal.     Insulin Pen Needle 32G X 6 MM Misc  Commonly known as:  NOVOFINE  Use as directed     levothyroxine 88 MCG tablet  Commonly known as:  SYNTHROID, LEVOTHROID  Take 1 tablet daily     multivitamin with minerals Tabs tablet  Take 1 tablet by mouth daily.     omeprazole 20 MG capsule  Commonly known as:  PRILOSEC  Take 1 capsule (20 mg total) by mouth  daily.     pravastatin 20 MG tablet  Commonly known as:  PRAVACHOL  TAKE 1 TABLET BY MOUTH EVERY DAY     pravastatin 20 MG tablet  Commonly known as:  PRAVACHOL  TAKE 1 TABLET BY MOUTH EVERY DAY     pregabalin 75 MG capsule  Commonly known as:  LYRICA  Take 1 capsule (75 mg total) by mouth 2 (two) times daily.     psyllium 58.6 % powder  Commonly known as:  METAMUCIL  Take 1 packet by mouth as directed.     traMADol 50 MG tablet  Commonly known as:  ULTRAM  TAKE 1 TABLET BY MOUTH TWICE DAILY        Allergies:  Allergies  Allergen Reactions  . Other     Ground red pepper lotion to rub on her knee caused SOB    Past Medical History  Diagnosis Date  . Diabetes mellitus without complication   . Tuberculosis     During the Great Depression  . Hypothyroidism     Past Surgical History  Procedure Laterality Date  . Hip fracture surgery      both hips    Family History  Problem Relation Age of Onset  . Diabetes Father     Social History:  reports that she has never smoked. She does not have any smokeless tobacco history on file. She reports that she does not drink alcohol or use illicit drugs.  Review of Systems:  LIPIDS: She  has been taking  pravastatin, previously had been on Lipitor. She is tolerating  this quite well, lipids have been excellent Liver functions are normal  Lab Results  Component Value Date   CHOL 141 02/08/2014   HDL 71.60 02/08/2014   LDLCALC 57 02/08/2014   TRIG 62.0 02/08/2014   CHOLHDL 2 02/08/2014    NEUROPATHY: She has had long-standing neuropathy with numbness and paresthesiae, on Lyrica. She thinks she has some more symptoms especially the sense of numbness in her feet on the plantar surfaces   Mild chronic kidney disease: This is likely related to nephrosclerosis and creatinine may go up  with diuretics Also her blood pressurhas been fairly good, possibly low for her age   Lab Results  Component Value Date  CREATININE  1.4* 08/15/2014    Back/hip/leg pain: She  has fairly good control of symptoms with tramadol Apparently has spinal stenosis and did get some relief temporarily with epidural steroid       Examination:   BP 128/50 mmHg  Pulse 66  Temp(Src) 97.9 F (36.6 C)  Resp 14  Ht _0  (1.6 m)  Wt 136 lb 6.4 oz (61.871 kg)  BMI 24.17 kg/m2  SpO2 97%  Body mass index is 24.17 kg/(m^2).   She is alert and  looking  well for her age  No edema present  Diabetic foot exam shows decreased monofilament sensation in the toes and plantar surfaces   ASSESSMENT/ PLAN:    Venous edema of the legs: Controllebut not clear if she needs Lasix since she has no edema now Because of her renal insufficiency will try to have her stop Lasix and use elastic stockings as needed She will take Lasix only if she has significant swelling Encouraged her to elevate her legs when sitting    Diabetes type 2   The patient's diabetes control appears to be  overall worse with high readings most of the time except occasionally better at suppertime This is despite her eating somewhat less overall and losing weight However may be having some weight loss related to hyperglycemia Since it will be easier to adjust her doses of Amaryl compared to insulin will have her add another half tablet of Amaryl at suppertime She will call if blood sugars are not well-controlled Again her blood sugar target should be about 150 Follow-up in 3 weeks. Once her blood sugars are better controlled she may be able to reduce frequency of monitoring   RENAL insufficiency:  will need to follow-up this today with renal functions.  May improve with not taking Lasix regularly but probably has element of nephrosclerosis   HYPOTHYROIDISM: Her TSH   has been in the therapeutic range  with current regimen of  Synthroid 88 mcg, 6 days a week and she will continue this, will need follow-up   Hyperlipidemia: Has been mild and she is  taking  pravastatin for cardiovascular prophylaxis  Anemia: Will need followup assessment    Neuropathy: She has mild persistent sensory loss, discussed foot care, to continue Lyrica   Preventive care: She has not had Prevnar and will do this today  Counseling time over 50% of today's 25 minute visit  Oakley Orban 11/21/2014, 2:16 PM

## 2014-11-23 ENCOUNTER — Other Ambulatory Visit: Payer: Self-pay | Admitting: Endocrinology

## 2014-11-28 ENCOUNTER — Telehealth: Payer: Self-pay | Admitting: Endocrinology

## 2014-11-28 NOTE — Telephone Encounter (Signed)
fyi pt ordered lyrica thru [facility].

## 2014-11-28 NOTE — Telephone Encounter (Signed)
rx faxed to [facility]

## 2014-11-30 ENCOUNTER — Ambulatory Visit (INDEPENDENT_AMBULATORY_CARE_PROVIDER_SITE_OTHER): Payer: Commercial Managed Care - HMO | Admitting: Podiatry

## 2014-11-30 ENCOUNTER — Encounter: Payer: Self-pay | Admitting: Podiatry

## 2014-11-30 VITALS — BP 121/57 | HR 57 | Resp 18

## 2014-11-30 DIAGNOSIS — B351 Tinea unguium: Secondary | ICD-10-CM | POA: Diagnosis not present

## 2014-11-30 DIAGNOSIS — E1140 Type 2 diabetes mellitus with diabetic neuropathy, unspecified: Secondary | ICD-10-CM

## 2014-11-30 DIAGNOSIS — M2040 Other hammer toe(s) (acquired), unspecified foot: Secondary | ICD-10-CM

## 2014-11-30 DIAGNOSIS — L84 Corns and callosities: Secondary | ICD-10-CM | POA: Diagnosis not present

## 2014-11-30 DIAGNOSIS — R52 Pain, unspecified: Secondary | ICD-10-CM

## 2014-11-30 DIAGNOSIS — E1149 Type 2 diabetes mellitus with other diabetic neurological complication: Secondary | ICD-10-CM

## 2014-11-30 DIAGNOSIS — M2010 Hallux valgus (acquired), unspecified foot: Secondary | ICD-10-CM

## 2014-11-30 NOTE — Progress Notes (Signed)
Patient ID: [name], female   DOB: 23-Jun-1915, 80 y.o.   MRN: [id number]  Subjective: 80 year old female returns the office they for diabetic risk assessment and for painful elongated toenails as also painful callus overlying the left bunion. She said her nails are particularly painful with shoe gear. The bunion rubs and the shoe causing a callus and irritation. She denies any smoothing and redness or streaking or any signs of infection. Denies any systemic complaints as fevers, chills, nausea, vomiting. No other complaints at this time. No acute changes since last appointment.  Objective: AAO 3, NAD DP/PT pulses palpable bilaterally, CRT less than 3 seconds Protective sensation 7 decreased with Simms Weinstein monofilament, vibratory sensation decreased. Significant HAV deformity with left greater than right. There is a small amount of hyperkeratotic tissue overlying the medial aspect first metatarsal head overlying the prominent bunion. There is mild erythema from irritation. There is no increase in warmth, edema or any ascending cellulitis. Nails hypertrophic, dystrophic, elongated, brittle, discolored 10. There is no swelling erythema or drainage from the nail sites. Hammertoe contractures bilaterally with a small hyperkeratotic lesion on the distal aspect of the left second digit. MMT 5/5, ROM WNL No pain with calf compression, swelling, warmth, erythema. No open lesions.  Assessment: 80 year old female with symptomatic onychomycosis hyperkeratotic lesions.  Plan: -Treatment options discussed including alternatives, risks, complications. -Nail sharply debrided 10 without complications. -Hyperkeratotic lesion sharply debrided 2 without complications. -Recommend continuing with offloading pads to help protect the prominent areas of the bunion and hammertoes. -Continue daily foot inspections. -Follow-up in 3 months or sooner if any problems are to arise. In the meantime, call the  office with any questions, concerns, change in symptoms.

## 2014-12-02 ENCOUNTER — Ambulatory Visit: Payer: Commercial Managed Care - HMO | Admitting: Podiatry

## 2014-12-12 ENCOUNTER — Other Ambulatory Visit (INDEPENDENT_AMBULATORY_CARE_PROVIDER_SITE_OTHER): Payer: Commercial Managed Care - HMO

## 2014-12-12 DIAGNOSIS — E063 Autoimmune thyroiditis: Secondary | ICD-10-CM

## 2014-12-12 DIAGNOSIS — N289 Disorder of kidney and ureter, unspecified: Secondary | ICD-10-CM

## 2014-12-12 DIAGNOSIS — E1165 Type 2 diabetes mellitus with hyperglycemia: Secondary | ICD-10-CM

## 2014-12-12 DIAGNOSIS — IMO0002 Reserved for concepts with insufficient information to code with codable children: Secondary | ICD-10-CM

## 2014-12-12 DIAGNOSIS — E038 Other specified hypothyroidism: Secondary | ICD-10-CM

## 2014-12-12 LAB — COMPREHENSIVE METABOLIC PANEL
ALT: 14 U/L (ref 0–35)
AST: 21 U/L (ref 0–37)
Albumin: 3.2 g/dL — ABNORMAL LOW (ref 3.5–5.2)
Alkaline Phosphatase: 60 U/L (ref 39–117)
BILIRUBIN TOTAL: 0.7 mg/dL (ref 0.2–1.2)
BUN: 40 mg/dL — ABNORMAL HIGH (ref 6–23)
CALCIUM: 8.8 mg/dL (ref 8.4–10.5)
CO2: 23 mEq/L (ref 19–32)
Chloride: 105 mEq/L (ref 96–112)
Creatinine, Ser: 1.6 mg/dL — ABNORMAL HIGH (ref 0.4–1.2)
GFR: 32.14 mL/min — ABNORMAL LOW (ref >60.00)
Glucose, Bld: 186 mg/dL — ABNORMAL HIGH (ref 70–99)
Potassium: 4.2 mEq/L (ref 3.5–5.1)
SODIUM: 137 meq/L (ref 135–145)
TOTAL PROTEIN: 6.3 g/dL (ref 6.0–8.3)

## 2014-12-12 LAB — LIPID PANEL
Cholesterol: 129 mg/dL (ref 0–200)
HDL: 51.9 mg/dL (ref >39.00)
LDL Cholesterol: 60 mg/dL (ref 0–99)
NONHDL: 77.1
Total CHOL/HDL Ratio: 2
Triglycerides: 88 mg/dL (ref 0.0–149.0)
VLDL: 17.6 mg/dL (ref 0.0–40.0)

## 2014-12-12 LAB — URINALYSIS, ROUTINE W REFLEX MICROSCOPIC
Bilirubin Urine: NEGATIVE
NITRITE: NEGATIVE
PH: 6 (ref 5.0–8.0)
SPECIFIC GRAVITY, URINE: 1.015 (ref 1.000–1.030)
Total Protein, Urine: NEGATIVE
URINE GLUCOSE: NEGATIVE
Urobilinogen, UA: 0.2 (ref 0.0–1.0)

## 2014-12-12 LAB — MICROALBUMIN / CREATININE URINE RATIO
Creatinine,U: 117.3 mg/dL
Microalb Creat Ratio: 2.8 mg/g (ref 0.0–30.0)
Microalb, Ur: 3.3 mg/dL — ABNORMAL HIGH (ref 0.0–1.9)

## 2014-12-12 LAB — CBC
HCT: 37.6 % (ref 36.0–46.0)
Hemoglobin: 12.1 g/dL (ref 12.0–15.0)
MCHC: 32.2 g/dL (ref 30.0–36.0)
MCV: 93.9 fl (ref 78.0–100.0)
Platelets: 181 10*3/uL (ref 150.0–400.0)
RBC: 4 Mil/uL (ref 3.87–5.11)
RDW: 13.8 % (ref 11.5–15.5)
WBC: 8.9 10*3/uL (ref 4.0–10.5)

## 2014-12-12 LAB — TSH: TSH: 0.19 u[IU]/mL — AB (ref 0.35–4.50)

## 2014-12-12 LAB — HEMOGLOBIN A1C: Hgb A1c MFr Bld: 9.2 % — ABNORMAL HIGH (ref 4.6–6.5)

## 2014-12-15 ENCOUNTER — Ambulatory Visit (INDEPENDENT_AMBULATORY_CARE_PROVIDER_SITE_OTHER): Payer: Commercial Managed Care - HMO | Admitting: Endocrinology

## 2014-12-15 ENCOUNTER — Encounter: Payer: Self-pay | Admitting: Endocrinology

## 2014-12-15 VITALS — BP 126/48 | HR 75 | Temp 97.8°F | Resp 14 | Ht 63.0 in | Wt 140.2 lb

## 2014-12-15 DIAGNOSIS — E038 Other specified hypothyroidism: Secondary | ICD-10-CM

## 2014-12-15 DIAGNOSIS — E1165 Type 2 diabetes mellitus with hyperglycemia: Secondary | ICD-10-CM

## 2014-12-15 DIAGNOSIS — IMO0002 Reserved for concepts with insufficient information to code with codable children: Secondary | ICD-10-CM

## 2014-12-15 DIAGNOSIS — E063 Autoimmune thyroiditis: Secondary | ICD-10-CM

## 2014-12-15 NOTE — Progress Notes (Signed)
Patient ID: [name], female   DOB: 07/31/1915, 80 y.o.   MRN: [id number]   Reason for Appointment: Follow-up of several problems  History of Present Illness    1.  Type 2 DIABETES MELITUS, long-standing  She has been on low dose NovoLog at mealtimes along with Amaryl once a day for several years with fairly good control Her fasting blood sugars previously have been fairly good with taking low dose Amaryl She does tend to have some postprandial hyperglycemia based on her diet periodically On her last visit since her blood sugars were significantly higher especially in the mornings she was told to add another 1 mg Amaryl at suppertime.  She was also losing weight With this her blood sugars are improving and now averaging about 166 compared to 186 previously Her blood sugars are fluctuating especially later in the day based on her diet and sometimes will go out to eat. She is usually compliant with her mealtime dose of NovoLog Again not checking readings after dinner usually She has not changed her diet significantly but has lost some weight Her A1c stays around 8% And recent level is not available  hypoglycemia  Insulin: Usually 2-4 units of NovoLog insulin when eating   Blood sugars from her Accu check download since 11/16/14:  PRE-MEAL Breakfast Lunch Dinner Bedtime Overall  Glucose range: 129-175  175-207   67-250   209    Mean/median:  170    158   166    Wt Readings from Last 3 Encounters:  12/15/14 140 lb 3.2 oz (63.594 kg)  11/21/14 136 lb 6.4 oz (61.871 kg)  08/19/14 143 lb 6.4 oz (65.046 kg)   Lab Results  Component Value Date   HGBA1C 9.2* 12/12/2014   HGBA1C 8.1* 08/15/2014   HGBA1C 8.3* 05/09/2014   Lab Results  Component Value Date   MICROALBUR 3.3* 12/12/2014   LDLCALC 60 12/12/2014   CREATININE 1.6* 12/12/2014        Medication List       This list is accurate as of: 12/15/14  2:09 PM.  Always use your most recent med list.                ACCU-CHEK FASTCLIX LANCET Kit  USE AS DIRECTED FOR TESTING     ACCU-CHEK FASTCLIX LANCETS Misc     aspirin EC 81 MG tablet  Take 81 mg by mouth daily.     CALTRATE 600+D 600-400 MG-UNIT per tablet  Generic drug:  Calcium Carbonate-Vitamin D  Take 1 tablet by mouth daily.     diclofenac sodium 1 % Gel  Commonly known as:  VOLTAREN  Apply 2 g topically 4 (four) times daily.     Diclofenac Sodium 3 % Gel  Place 15 mg onto the skin 2 (two) times daily. Use small amount on knee     estazolam 1 MG tablet  Commonly known as:  PROSOM  Take 1 mg by mouth at bedtime.     FLUVIRIN IM     furosemide 20 MG tablet  Commonly known as:  LASIX  Take 1 tablet (20 mg total) by mouth daily.     glimepiride 2 MG tablet  Commonly known as:  AMARYL  Take 1 tablet in the am and 1/2 tablet at dinner     glucose blood test strip  Commonly known as:  ACCU-CHEK AVIVA PLUS  Use to check blood sugar three times  a day     insulin aspart 100   UNIT/ML injection  Commonly known as:  novoLOG  If blood sugar is over 200, take 5 units and if they go over 300, take 6 units at that meal.     Insulin Pen Needle 32G X 6 MM Misc  Commonly known as:  NOVOFINE  Use as directed     levothyroxine 88 MCG tablet  Commonly known as:  SYNTHROID, LEVOTHROID  Take 1 tablet daily     multivitamin with minerals Tabs tablet  Take 1 tablet by mouth daily.     omeprazole 20 MG capsule  Commonly known as:  PRILOSEC  Take 1 capsule (20 mg total) by mouth daily.     pravastatin 20 MG tablet  Commonly known as:  PRAVACHOL     pregabalin 75 MG capsule  Commonly known as:  LYRICA  Take 1 capsule (75 mg total) by mouth 2 (two) times daily.     psyllium 58.6 % powder  Commonly known as:  METAMUCIL  Take 1 packet by mouth as directed.     traMADol 50 MG tablet  Commonly known as:  ULTRAM  Take 1 tablet (50 mg total) by mouth 2 (two) times daily.        Allergies:  Allergies  Allergen Reactions  . Other      Ground red pepper lotion to rub on her knee caused SOB    Past Medical History  Diagnosis Date  . Diabetes mellitus without complication   . Tuberculosis     During the Great Depression  . Hypothyroidism     Past Surgical History  Procedure Laterality Date  . Hip fracture surgery      both hips    Family History  Problem Relation Age of Onset  . Diabetes Father     Social History:  reports that she has never smoked. She does not have any smokeless tobacco history on file. She reports that she does not drink alcohol or use illicit drugs.  Review of Systems:    Lower leg EDEMA: She has had intermittent swelling of her legs.  This has not been a problem lately and has done well without Lasix also Does not like to wear the elastic stockings consistently   . HYPOTHYROIDISM:  She has been on thyroid supplement long-term, currently taking 88 mcg, 6/7 days a week  and is compliant with this.  Not complaining of  unusual fatigue TSH is low  Lab Results  Component Value Date   FREET4 1.19 08/15/2014   FREET4 0.93 06/20/2014   FREET4 1.04 02/08/2014   TSH 0.19* 12/12/2014   TSH 2.27 08/15/2014   TSH 0.67 06/20/2014    LIPIDS: She  has been taking  pravastatin, previously had been on Lipitor. She is tolerating  this quite well, lipids have been excellent   Lab Results  Component Value Date   CHOL 129 12/12/2014   HDL 51.90 12/12/2014   LDLCALC 60 12/12/2014   TRIG 88.0 12/12/2014   CHOLHDL 2 12/12/2014    NEUROPATHY: She has had long-standing neuropathy with numbness and paresthesiae, on Lyrica.  She  has had some progression of symptoms especially the sense of numbness in her feet on the plantar surfaces  Diabetic foot exam in 11/15 showed decreased monofilament sensation in the toes and plantar surfaces    Mild chronic kidney disease: This is likely related to nephrosclerosis and creatinine may go up  with diuretics but is persistently high now  Lab Results   Component Value Date  CREATININE 1.6* 12/12/2014   Also her blood pressure has been fairly good   Back/hip/leg pain: She  has fairly good control of symptoms with tramadol Apparently has spinal stenosis and previously did get some relief temporarily with epidural steroid       Examination:   BP 126/48 mmHg  Pulse 75  Temp(Src) 97.8 F (36.6 C)  Resp 14  Ht 5' 3" (1.6 m)  Wt 140 lb 3.2 oz (63.594 kg)  BMI 24.84 kg/m2  SpO2 96%  Body mass index is 24.84 kg/(m^2).    No edema present   ASSESSMENT/ PLAN:     Diabetes type 2   The patient's diabetes control appears to be  a little better with adding half tablet of Amaryl in the evening Blood sugars are still not well controlled but only occasionally over 200 now Since she has variable postprandial readings he was asked to increase her dose by at least one unit when she is eating out or getting desserts Also may skip her lunchtime dose if not eating a full meal   RENAL insufficiency:  Still present despite not taking diuretics, appears to be a chronic problem now    HYPOTHYROIDISM: Her TSH  is now relatively low and she can reduce her dose to 5 days a week and skip on Sundays and Wednesdays.  Next prescription will be half tablet of 125 g daily  Patient Instructions  Take Synthroid 5 days a week and call before refilling  May skip Novolog if eating very little starch such as at lunch  No Furosemide unless swelling significantly    , 12/15/2014, 2:09 PM            

## 2014-12-15 NOTE — Patient Instructions (Addendum)
Take Synthroid 5 days a week and call before refilling  May skip Novolog if eating very little starch such as at lunch  No Furosemide unless swelling significantly

## 2014-12-31 ENCOUNTER — Other Ambulatory Visit: Payer: Self-pay | Admitting: Endocrinology

## 2015-01-02 ENCOUNTER — Telehealth: Payer: Self-pay | Admitting: Endocrinology

## 2015-01-02 NOTE — Telephone Encounter (Signed)
Patient ask if you would call her. °

## 2015-02-13 ENCOUNTER — Other Ambulatory Visit: Payer: Commercial Managed Care - HMO

## 2015-02-15 ENCOUNTER — Telehealth: Payer: Self-pay | Admitting: Endocrinology

## 2015-02-15 ENCOUNTER — Other Ambulatory Visit (INDEPENDENT_AMBULATORY_CARE_PROVIDER_SITE_OTHER): Payer: Commercial Managed Care - HMO

## 2015-02-15 DIAGNOSIS — E063 Autoimmune thyroiditis: Secondary | ICD-10-CM

## 2015-02-15 DIAGNOSIS — E038 Other specified hypothyroidism: Secondary | ICD-10-CM | POA: Diagnosis not present

## 2015-02-15 DIAGNOSIS — IMO0002 Reserved for concepts with insufficient information to code with codable children: Secondary | ICD-10-CM

## 2015-02-15 DIAGNOSIS — E1165 Type 2 diabetes mellitus with hyperglycemia: Secondary | ICD-10-CM | POA: Diagnosis not present

## 2015-02-15 LAB — BASIC METABOLIC PANEL
BUN: 35 mg/dL — AB (ref 6–23)
CO2: 26 meq/L (ref 19–32)
CREATININE: 1.41 mg/dL — AB (ref 0.40–1.20)
Calcium: 9.4 mg/dL (ref 8.4–10.5)
Chloride: 107 mEq/L (ref 96–112)
GFR: 36.64 mL/min — ABNORMAL LOW (ref >60.00)
Glucose, Bld: 156 mg/dL — ABNORMAL HIGH (ref 70–99)
POTASSIUM: 5 meq/L (ref 3.5–5.1)
Sodium: 139 mEq/L (ref 135–145)

## 2015-02-15 LAB — TSH: TSH: 0.73 u[IU]/mL (ref 0.35–4.50)

## 2015-02-15 LAB — HEMOGLOBIN A1C: Hgb A1c MFr Bld: 8.5 % — ABNORMAL HIGH (ref 4.6–6.5)

## 2015-02-15 NOTE — Telephone Encounter (Signed)
Pt needs lancets for her insulin please

## 2015-02-15 NOTE — Telephone Encounter (Signed)
Called pt and lvm asking her to call back and clarify what she needs.

## 2015-02-16 ENCOUNTER — Ambulatory Visit: Payer: Commercial Managed Care - HMO | Admitting: Endocrinology

## 2015-02-16 MED ORDER — INSULIN ASPART 100 UNIT/ML FLEXPEN: PEN_INJECTOR | SUBCUTANEOUS | Status: AC

## 2015-02-16 NOTE — Telephone Encounter (Signed)
Pt called back. She needs her Novolog flex pen called into her pharmacy.

## 2015-02-17 ENCOUNTER — Other Ambulatory Visit: Payer: Self-pay | Admitting: Endocrinology

## 2015-02-20 ENCOUNTER — Encounter: Payer: Self-pay | Admitting: Endocrinology

## 2015-02-20 ENCOUNTER — Ambulatory Visit (INDEPENDENT_AMBULATORY_CARE_PROVIDER_SITE_OTHER): Payer: Commercial Managed Care - HMO | Admitting: Endocrinology

## 2015-02-20 VITALS — BP 124/58 | HR 61 | Temp 97.9°F | Resp 14 | Ht 63.0 in | Wt 141.0 lb

## 2015-02-20 DIAGNOSIS — R413 Other amnesia: Secondary | ICD-10-CM | POA: Diagnosis not present

## 2015-02-20 DIAGNOSIS — E1165 Type 2 diabetes mellitus with hyperglycemia: Secondary | ICD-10-CM | POA: Diagnosis not present

## 2015-02-20 DIAGNOSIS — IMO0002 Reserved for concepts with insufficient information to code with codable children: Secondary | ICD-10-CM

## 2015-02-20 DIAGNOSIS — E1142 Type 2 diabetes mellitus with diabetic polyneuropathy: Secondary | ICD-10-CM

## 2015-02-20 DIAGNOSIS — N183 Chronic kidney disease, stage 3 unspecified: Secondary | ICD-10-CM

## 2015-02-20 NOTE — Progress Notes (Signed)
Patient ID: [name], female   DOB: 18-Sep-1915, 81 y.o.   MRN: [id number]   Reason for Appointment: Follow-up of several problems  History of Present Illness    1.  Type 2 DIABETES MELITUS, long-standing  She has been on low dose NovoLog at mealtimes along with Amaryl once a day for several years with fairly good control Her fasting blood sugars previously have been fairly good with taking low dose Amaryl in the morning She does tend to have some postprandial hyperglycemia based on her diet  Since 2015 she has been taking a half tablet of Amaryl at dinnertime also which has helped her fasting readings to be consistent  Recent history: Her blood sugars had been somewhat higher during the day especially in the early afternoon about 2 weeks ago and she is not clear why. However more recently blood sugars have been improving More recently she has had a few episodes of blood sugars in the 60s before supper time She thinks this is because of not eating sandwiches and eating only chicken and crackers at lunchtime  Blood sugars are generally fairly good in the morning Does not check blood sugars after dinner A1c is slightly better at 8.5 Sometimes not compliant with her insulin when she is eating out and blood sugar will be high when she comes back  Insulin: Usually 2-4 units of NovoLog insulin when eating   Blood sugars from her Accu check download   PRE-MEAL Breakfast Lunch Dinner Bedtime Overall  Glucose range:  121-170   77-206   63-196     Mean/median:     140     Wt Readings from Last 3 Encounters:  02/20/15 141 lb (63.957 kg)  12/15/14 140 lb 3.2 oz (63.594 kg)  11/21/14 136 lb 6.4 oz (61.871 kg)   Lab Results  Component Value Date   HGBA1C 8.5* 02/15/2015   HGBA1C 9.2* 12/12/2014   HGBA1C 8.1* 08/15/2014   Lab Results  Component Value Date   MICROALBUR 3.3* 12/12/2014   LDLCALC 60 12/12/2014   CREATININE 1.41* 02/15/2015        Medication List        This list is accurate as of: 02/20/15  5:15 PM.  Always use your most recent med list.               ACCU-CHEK FASTCLIX LANCET Kit  USE AS DIRECTED FOR TESTING     ACCU-CHEK FASTCLIX LANCETS Misc     aspirin EC 81 MG tablet  Take 81 mg by mouth daily.     CALTRATE 600+D 600-400 MG-UNIT per tablet  Generic drug:  Calcium Carbonate-Vitamin D  Take 1 tablet by mouth daily.     diclofenac sodium 1 % Gel  Commonly known as:  VOLTAREN  Apply 2 g topically 4 (four) times daily.     Diclofenac Sodium 3 % Gel  Place 15 mg onto the skin 2 (two) times daily. Use small amount on knee     estazolam 1 MG tablet  Commonly known as:  PROSOM  Take 1 mg by mouth at bedtime.     FLUVIRIN IM     glimepiride 2 MG tablet  Commonly known as:  AMARYL  TAKE 1 TABLET BY MOUTH IN THE MORNING AND 1/2 TABLET AT DINNER     glucose blood test strip  Commonly known as:  ACCU-CHEK AVIVA PLUS  Use to check blood sugar three times  a day     insulin aspart  100 UNIT/ML FlexPen  Commonly known as:  NOVOLOG FLEXPEN  If blood sugar is over 200, take 5 units and if they go over 300, take 6 units at that meal.     Insulin Pen Needle 32G X 6 MM Misc  Commonly known as:  NOVOFINE  Use as directed     levothyroxine 88 MCG tablet  Commonly known as:  SYNTHROID, LEVOTHROID  Take 1 tablet daily     LYRICA 75 MG capsule  Generic drug:  pregabalin  TAKE 1 CAPSULE TWICE DAILY     multivitamin with minerals Tabs tablet  Take 1 tablet by mouth daily.     omeprazole 20 MG capsule  Commonly known as:  PRILOSEC  Take 1 capsule (20 mg total) by mouth daily.     pravastatin 20 MG tablet  Commonly known as:  PRAVACHOL     psyllium 58.6 % powder  Commonly known as:  METAMUCIL  Take 1 packet by mouth as directed.     traMADol 50 MG tablet  Commonly known as:  ULTRAM  Take 1 tablet (50 mg total) by mouth 2 (two) times daily.        Allergies:  Allergies  Allergen Reactions  . Other     Ground red  pepper lotion to rub on her knee caused SOB    Past Medical History  Diagnosis Date  . Diabetes mellitus without complication   . Tuberculosis     During the Great Depression  . Hypothyroidism     Past Surgical History  Procedure Laterality Date  . Hip fracture surgery      both hips    Family History  Problem Relation Age of Onset  . Diabetes Father     Social History:  reports that she has never smoked. She does not have any smokeless tobacco history on file. She reports that she does not drink alcohol or use illicit drugs.  Review of Systems:  EDEMA: She has had intermittent swelling of her lower legs.  This has not been a problem consistently but she says on some days she will get swelling, has not taken any Lasix Does not like to wear the elastic stockings consistently but has done this on some days now  HYPOTHYROIDISM:  She has been on thyroid supplement long-term, currently taking 88 mcg, 5/7 days a week  and is compliant with this, using her pillbox for medication dispensing.  She appears to be needing lower doses of supplement more recently Not complaining of  fatigue TSH is back to normal now  Lab Results  Component Value Date   FREET4 1.19 08/15/2014   FREET4 0.93 06/20/2014   FREET4 1.04 02/08/2014   TSH 0.73 02/15/2015   TSH 0.19* 12/12/2014   TSH 2.27 08/15/2014    LIPIDS: She  has been taking  pravastatin, previously had been on Lipitor. She is tolerating  this quite well, lipids have been excellent   Lab Results  Component Value Date   CHOL 129 12/12/2014   HDL 51.90 12/12/2014   LDLCALC 60 12/12/2014   TRIG 88.0 12/12/2014   CHOLHDL 2 12/12/2014    NEUROPATHY: She has had long-standing neuropathy with numbness and paresthesiae, on Lyrica.  She  has had some progression of symptoms in 2015 especially the sense of numbness in her feet on the plantar surfaces. She does have some neuropathic symptoms still present with discomfort in the legs  and feet but does not want to increase Lyrica because of the  cost She also takes Ultram with her Lyrica   Diabetic foot exam in 11/15 showed decreased monofilament sensation in the toes and plantar surfaces    Mild chronic kidney disease: This is likely related to nephrosclerosis and creatinine is fluctuating but overall stable  Lab Results  Component Value Date   CREATININE 1.41* 02/15/2015   Her blood pressure has been fairly good without any medications   Back/hip/leg pain: She  has fairly good control of symptoms with tramadol Apparently has spinal stenosis and previously did get some relief temporarily with epidural steroid  She is having issues with her memory and she is coping by making notes for her daily activities, reluctant to go to a nursing home because of cost      Examination:   BP 124/58 mmHg  Pulse 61  Temp(Src) 97.9 F (36.6 C)  Resp 14  Ht '5\' 3"'  (1.6 m)  Wt 141 lb (63.957 kg)  BMI 24.98 kg/m2  SpO2 97%  Body mass index is 24.98 kg/(m^2).   1+ edema present on the right ankle and trace on the left   ASSESSMENT/ PLAN:     Diabetes type 2   The patient's diabetes control appears to be  a little better recently although she has had readings as high as 306 in the early afternoon.  with adding half tablet of Amaryl in the evening Blood sugars are still not well controlled but only occasionally over 200 now; this could be related to delayed dosage of her NovoLog at lunchtime when eating out  However on some days she is getting mild hypoglycemia before supper time related to her decreased carbohydrate intake at lunch Advised her to skip her NovoLog on those days Also for now will reduce her Amaryl in the morning to half tablet and continue half tablet at suppertime. Since she is not getting much protein at breakfast discussed choices for adding this and avoiding low sugars at lunchtime   Peripheral neuropathy with increased symptoms.  She is reluctant to  increase her Lyrica and will take Ultram as needed again   RENAL insufficiency:  Still present but notably mild increased now.  No hyperkalemia and will continue to monitor    HYPOTHYROIDISM: Her TSH  is now back to normal with taking the medication 5 days a week.  She is comfortable with this regimen and is using a pillbox for compliance and will leave her on the same prescription  Mild memory loss: She is able to cope with this and doubt the need for any medications, will check her B-12 level on the next visit  Leg edema: She can take Lasix as needed  Patient Instructions  Skip Novolog at lunch if not eating a sandwich  Add protein at breakfast daily  May take fluid pill ONLY if swelling  Glimeperide 1/2 twice daily   Counseling time over 50% of today's 25 minute visit  Donette Mainwaring 02/20/2015, 5:15 PM

## 2015-02-20 NOTE — Patient Instructions (Addendum)
Skip Novolog at lunch if not eating a sandwich  Add protein at breakfast daily  May take fluid pill ONLY if swelling  Glimeperide 1/2 twice daily

## 2015-03-01 ENCOUNTER — Encounter: Payer: Self-pay | Admitting: Podiatry

## 2015-03-01 ENCOUNTER — Ambulatory Visit (INDEPENDENT_AMBULATORY_CARE_PROVIDER_SITE_OTHER): Payer: Commercial Managed Care - HMO | Admitting: Podiatry

## 2015-03-01 DIAGNOSIS — R52 Pain, unspecified: Secondary | ICD-10-CM

## 2015-03-01 DIAGNOSIS — E1140 Type 2 diabetes mellitus with diabetic neuropathy, unspecified: Secondary | ICD-10-CM | POA: Diagnosis not present

## 2015-03-01 DIAGNOSIS — B351 Tinea unguium: Secondary | ICD-10-CM | POA: Diagnosis not present

## 2015-03-01 DIAGNOSIS — M2010 Hallux valgus (acquired), unspecified foot: Secondary | ICD-10-CM | POA: Diagnosis not present

## 2015-03-01 DIAGNOSIS — L84 Corns and callosities: Secondary | ICD-10-CM | POA: Diagnosis not present

## 2015-03-01 DIAGNOSIS — E1149 Type 2 diabetes mellitus with other diabetic neurological complication: Secondary | ICD-10-CM

## 2015-03-02 ENCOUNTER — Inpatient Hospital Stay (HOSPITAL_COMMUNITY)
Admission: EM | Admit: 2015-03-02 | Discharge: 2015-03-08 | DRG: 326 | Disposition: A | Discharge status: Skilled Nursing Facility | Payer: Commercial Managed Care - HMO | Attending: Internal Medicine | Admitting: Internal Medicine

## 2015-03-02 ENCOUNTER — Emergency Department (HOSPITAL_COMMUNITY): Payer: Commercial Managed Care - HMO

## 2015-03-02 ENCOUNTER — Encounter (HOSPITAL_COMMUNITY): Payer: Self-pay

## 2015-03-02 DIAGNOSIS — R1310 Dysphagia, unspecified: Secondary | ICD-10-CM | POA: Diagnosis present

## 2015-03-02 DIAGNOSIS — I639 Cerebral infarction, unspecified: Secondary | ICD-10-CM | POA: Diagnosis present

## 2015-03-02 DIAGNOSIS — E1122 Type 2 diabetes mellitus with diabetic chronic kidney disease: Secondary | ICD-10-CM | POA: Diagnosis present

## 2015-03-02 DIAGNOSIS — M1990 Unspecified osteoarthritis, unspecified site: Secondary | ICD-10-CM | POA: Diagnosis present

## 2015-03-02 DIAGNOSIS — K250 Acute gastric ulcer with hemorrhage: Secondary | ICD-10-CM

## 2015-03-02 DIAGNOSIS — K2970 Gastritis, unspecified, without bleeding: Secondary | ICD-10-CM | POA: Diagnosis not present

## 2015-03-02 DIAGNOSIS — K2961 Other gastritis with bleeding: Secondary | ICD-10-CM | POA: Diagnosis not present

## 2015-03-02 DIAGNOSIS — D62 Acute posthemorrhagic anemia: Secondary | ICD-10-CM | POA: Diagnosis present

## 2015-03-02 DIAGNOSIS — R197 Diarrhea, unspecified: Secondary | ICD-10-CM | POA: Diagnosis not present

## 2015-03-02 DIAGNOSIS — E875 Hyperkalemia: Secondary | ICD-10-CM | POA: Diagnosis present

## 2015-03-02 DIAGNOSIS — D72829 Elevated white blood cell count, unspecified: Secondary | ICD-10-CM | POA: Diagnosis present

## 2015-03-02 DIAGNOSIS — E039 Hypothyroidism, unspecified: Secondary | ICD-10-CM | POA: Diagnosis present

## 2015-03-02 DIAGNOSIS — Z7982 Long term (current) use of aspirin: Secondary | ICD-10-CM | POA: Diagnosis not present

## 2015-03-02 DIAGNOSIS — Z794 Long term (current) use of insulin: Secondary | ICD-10-CM | POA: Diagnosis not present

## 2015-03-02 DIAGNOSIS — Z79899 Other long term (current) drug therapy: Secondary | ICD-10-CM

## 2015-03-02 DIAGNOSIS — K2940 Chronic atrophic gastritis without bleeding: Secondary | ICD-10-CM | POA: Diagnosis not present

## 2015-03-02 DIAGNOSIS — K922 Gastrointestinal hemorrhage, unspecified: Secondary | ICD-10-CM | POA: Diagnosis not present

## 2015-03-02 DIAGNOSIS — N183 Chronic kidney disease, stage 3 unspecified: Secondary | ICD-10-CM | POA: Diagnosis present

## 2015-03-02 DIAGNOSIS — Z91048 Other nonmedicinal substance allergy status: Secondary | ICD-10-CM

## 2015-03-02 DIAGNOSIS — E1140 Type 2 diabetes mellitus with diabetic neuropathy, unspecified: Secondary | ICD-10-CM | POA: Diagnosis present

## 2015-03-02 DIAGNOSIS — M6281 Muscle weakness (generalized): Secondary | ICD-10-CM | POA: Diagnosis not present

## 2015-03-02 DIAGNOSIS — R42 Dizziness and giddiness: Secondary | ICD-10-CM | POA: Diagnosis present

## 2015-03-02 DIAGNOSIS — Z8611 Personal history of tuberculosis: Secondary | ICD-10-CM

## 2015-03-02 DIAGNOSIS — E1129 Type 2 diabetes mellitus with other diabetic kidney complication: Secondary | ICD-10-CM | POA: Diagnosis not present

## 2015-03-02 DIAGNOSIS — E1142 Type 2 diabetes mellitus with diabetic polyneuropathy: Secondary | ICD-10-CM | POA: Diagnosis present

## 2015-03-02 DIAGNOSIS — E1340 Other specified diabetes mellitus with diabetic neuropathy, unspecified: Secondary | ICD-10-CM | POA: Diagnosis not present

## 2015-03-02 DIAGNOSIS — I6350 Cerebral infarction due to unspecified occlusion or stenosis of unspecified cerebral artery: Secondary | ICD-10-CM | POA: Diagnosis not present

## 2015-03-02 DIAGNOSIS — R609 Edema, unspecified: Secondary | ICD-10-CM

## 2015-03-02 DIAGNOSIS — I6789 Other cerebrovascular disease: Secondary | ICD-10-CM | POA: Diagnosis not present

## 2015-03-02 DIAGNOSIS — R2681 Unsteadiness on feet: Secondary | ICD-10-CM | POA: Diagnosis not present

## 2015-03-02 DIAGNOSIS — N3281 Overactive bladder: Secondary | ICD-10-CM

## 2015-03-02 DIAGNOSIS — L8990 Pressure ulcer of unspecified site, unspecified stage: Secondary | ICD-10-CM | POA: Diagnosis not present

## 2015-03-02 HISTORY — DX: Type 2 diabetes mellitus without complications: E11.9

## 2015-03-02 LAB — COMPREHENSIVE METABOLIC PANEL
ALBUMIN: 3.3 g/dL — AB (ref 3.5–5.2)
ALT: 14 U/L (ref 0–35)
ANION GAP: 4 — AB (ref 5–15)
AST: 21 U/L (ref 0–37)
Alkaline Phosphatase: 63 U/L (ref 39–117)
BUN: 37 mg/dL — ABNORMAL HIGH (ref 6–23)
CO2: 27 mmol/L (ref 19–32)
CREATININE: 1.41 mg/dL — AB (ref 0.50–1.10)
Calcium: 8.7 mg/dL (ref 8.4–10.5)
Chloride: 109 mmol/L (ref 96–112)
GFR calc Af Amer: 35 mL/min — ABNORMAL LOW (ref >90)
GFR calc non Af Amer: 30 mL/min — ABNORMAL LOW (ref >90)
Glucose, Bld: 238 mg/dL — ABNORMAL HIGH (ref 70–99)
Potassium: 5.4 mmol/L — ABNORMAL HIGH (ref 3.5–5.1)
Sodium: 140 mmol/L (ref 135–145)
TOTAL PROTEIN: 5.7 g/dL — AB (ref 6.0–8.3)
Total Bilirubin: 0.3 mg/dL (ref 0.3–1.2)

## 2015-03-02 LAB — CBC
HCT: 31.1 % — ABNORMAL LOW (ref 36.0–46.0)
Hemoglobin: 10.1 g/dL — ABNORMAL LOW (ref 12.0–15.0)
MCH: 30.9 pg (ref 26.0–34.0)
MCHC: 32.5 g/dL (ref 30.0–36.0)
MCV: 95.1 fL (ref 78.0–100.0)
Platelets: 145 10*3/uL — ABNORMAL LOW (ref 150–400)
RBC: 3.27 MIL/uL — ABNORMAL LOW (ref 3.87–5.11)
RDW: 13.3 % (ref 11.5–15.5)
WBC: 10.9 10*3/uL — ABNORMAL HIGH (ref 4.0–10.5)

## 2015-03-02 LAB — HEMOGLOBIN AND HEMATOCRIT, BLOOD
HCT: 21.5 % — ABNORMAL LOW (ref 36.0–46.0)
Hemoglobin: 7.1 g/dL — ABNORMAL LOW (ref 12.0–15.0)

## 2015-03-02 LAB — POC OCCULT BLOOD, ED: Fecal Occult Bld: NEGATIVE

## 2015-03-02 LAB — GLUCOSE, CAPILLARY: Glucose-Capillary: 273 mg/dL — ABNORMAL HIGH (ref 70–99)

## 2015-03-02 LAB — OCCULT BLOOD X 1 CARD TO LAB, STOOL: FECAL OCCULT BLD: POSITIVE — AB

## 2015-03-02 LAB — I-STAT TROPONIN, ED: Troponin i, poc: 0.01 ng/mL (ref 0.00–0.08)

## 2015-03-02 LAB — TROPONIN I: Troponin I: <0.03 ng/mL (ref <0.031)

## 2015-03-02 MED ORDER — LEVOTHYROXINE SODIUM 88 MCG PO TABS
88.0000 ug | ORAL_TABLET | ORAL | Status: DC
  Administered 2015-03-04 – 2015-03-06 (×2): 88 ug via ORAL
  Filled 2015-03-02 (×6): qty 1

## 2015-03-02 MED ORDER — INSULIN ASPART 100 UNIT/ML ~~LOC~~ SOLN
0.0000 [IU] | Freq: Three times a day (TID) | SUBCUTANEOUS | Status: DC
  Administered 2015-03-02: 7 [IU] via SUBCUTANEOUS

## 2015-03-02 MED ORDER — PRAVASTATIN SODIUM 20 MG PO TABS
20.0000 mg | ORAL_TABLET | Freq: Every day | ORAL | Status: DC
  Administered 2015-03-02 – 2015-03-07 (×6): 20 mg via ORAL
  Filled 2015-03-02 (×7): qty 1

## 2015-03-02 MED ORDER — TRAMADOL HCL 50 MG PO TABS
50.0000 mg | ORAL_TABLET | Freq: Two times a day (BID) | ORAL | Status: DC
  Administered 2015-03-02 – 2015-03-03 (×2): 50 mg via ORAL
  Filled 2015-03-02 (×2): qty 1

## 2015-03-02 MED ORDER — SODIUM CHLORIDE 0.9 % IV BOLUS (SEPSIS)
500.0000 mL | Freq: Once | INTRAVENOUS | Status: AC
  Administered 2015-03-02: 500 mL via INTRAVENOUS

## 2015-03-02 MED ORDER — HEPARIN SODIUM (PORCINE) 5000 UNIT/ML IJ SOLN: 5000.0000 [IU] | Freq: Three times a day (TID) | INTRAMUSCULAR | Status: DC

## 2015-03-02 MED ORDER — INSULIN ASPART 100 UNIT/ML ~~LOC~~ SOLN
0.0000 [IU] | SUBCUTANEOUS | Status: DC
  Administered 2015-03-02: 5 [IU] via SUBCUTANEOUS
  Administered 2015-03-03: 2 [IU] via SUBCUTANEOUS
  Administered 2015-03-03: 1 [IU] via SUBCUTANEOUS
  Administered 2015-03-03 – 2015-03-05 (×7): 2 [IU] via SUBCUTANEOUS
  Administered 2015-03-05: 3 [IU] via SUBCUTANEOUS
  Administered 2015-03-05 (×3): 1 [IU] via SUBCUTANEOUS
  Administered 2015-03-06: 2 [IU] via SUBCUTANEOUS
  Administered 2015-03-06: 3 [IU] via SUBCUTANEOUS
  Administered 2015-03-06 (×2): 1 [IU] via SUBCUTANEOUS
  Administered 2015-03-06 – 2015-03-07 (×2): 2 [IU] via SUBCUTANEOUS
  Administered 2015-03-07: 1 [IU] via SUBCUTANEOUS
  Administered 2015-03-07: 3 [IU] via SUBCUTANEOUS
  Administered 2015-03-08: 1 [IU] via SUBCUTANEOUS
  Administered 2015-03-08 (×2): 2 [IU] via SUBCUTANEOUS

## 2015-03-02 MED ORDER — SODIUM CHLORIDE 0.9 % IV SOLN
8.0000 mg/h | INTRAVENOUS | Status: AC
  Administered 2015-03-02 – 2015-03-04 (×2): 8 mg/h via INTRAVENOUS
  Filled 2015-03-02 (×10): qty 80

## 2015-03-02 MED ORDER — INSULIN ASPART 100 UNIT/ML ~~LOC~~ SOLN: 0.0000 [IU] | Freq: Every day | SUBCUTANEOUS | Status: DC

## 2015-03-02 MED ORDER — PREGABALIN 75 MG PO CAPS
75.0000 mg | ORAL_CAPSULE | Freq: Two times a day (BID) | ORAL | Status: DC
  Administered 2015-03-02 – 2015-03-08 (×12): 75 mg via ORAL
  Filled 2015-03-02 (×12): qty 1

## 2015-03-02 MED ORDER — INSULIN ASPART 100 UNIT/ML ~~LOC~~ SOLN: 0.0000 [IU] | Freq: Three times a day (TID) | SUBCUTANEOUS | Status: DC

## 2015-03-02 MED ORDER — PANTOPRAZOLE SODIUM 40 MG IV SOLR
40.0000 mg | Freq: Two times a day (BID) | INTRAVENOUS | Status: DC
  Administered 2015-03-06: 40 mg via INTRAVENOUS
  Filled 2015-03-02 (×2): qty 40

## 2015-03-02 MED ORDER — SODIUM CHLORIDE 0.9 % IJ SOLN
3.0000 mL | Freq: Two times a day (BID) | INTRAMUSCULAR | Status: DC
  Administered 2015-03-02 – 2015-03-08 (×10): 3 mL via INTRAVENOUS

## 2015-03-02 MED ORDER — SODIUM CHLORIDE 0.9 % IV SOLN: Freq: Once | INTRAVENOUS | Status: DC

## 2015-03-02 MED ORDER — ASPIRIN 81 MG PO CHEW
81.0000 mg | CHEWABLE_TABLET | Freq: Every day | ORAL | Status: DC
  Administered 2015-03-02 – 2015-03-03 (×2): 81 mg via ORAL
  Filled 2015-03-02 (×2): qty 1

## 2015-03-02 MED ORDER — ADULT MULTIVITAMIN W/MINERALS CH
1.0000 | ORAL_TABLET | Freq: Every day | ORAL | Status: DC
  Administered 2015-03-03: 1 via ORAL
  Filled 2015-03-02: qty 1

## 2015-03-02 MED ORDER — SODIUM CHLORIDE 0.9 % IV SOLN
80.0000 mg | Freq: Once | INTRAVENOUS | Status: AC
  Administered 2015-03-02: 80 mg via INTRAVENOUS
  Filled 2015-03-02: qty 80

## 2015-03-02 NOTE — ED Provider Notes (Signed)
CSN: [id number]     Arrival date & time 03/02/15  1011 History   First MD Initiated Contact with Patient 03/02/15 1012     Chief Complaint  Patient presents with  . Dizziness     (Consider location/radiation/quality/duration/timing/severity/associated sxs/prior Treatment) Patient is a 81 y.o. female presenting with dizziness. The history is provided by the patient (the pt comlains of dizziness when she stands up).  Dizziness Quality:  Lightheadedness Severity:  Moderate Onset quality:  Sudden Timing:  Constant Progression:  Waxing and waning Chronicity:  New Context: not with bowel movement   Associated symptoms: no chest pain, no diarrhea and no headaches     Past Medical History  Diagnosis Date  . Diabetes mellitus without complication   . Tuberculosis     During the Great Depression  . Hypothyroidism    Past Surgical History  Procedure Laterality Date  . Hip fracture surgery      both hips   Family History  Problem Relation Age of Onset  . Diabetes Father    History  Substance Use Topics  . Smoking status: Never Smoker   . Smokeless tobacco: Not on file  . Alcohol Use: No   OB History    No data available     Review of Systems  Constitutional: Negative for appetite change and fatigue.  HENT: Negative for congestion, ear discharge and sinus pressure.   Eyes: Negative for discharge.  Respiratory: Negative for cough.   Cardiovascular: Negative for chest pain.  Gastrointestinal: Negative for abdominal pain and diarrhea.  Genitourinary: Negative for frequency and hematuria.  Musculoskeletal: Negative for back pain.  Skin: Negative for rash.  Neurological: Positive for dizziness. Negative for seizures and headaches.  Psychiatric/Behavioral: Negative for hallucinations.      Allergies  Other  Home Medications   Prior to Admission medications   Medication Sig Start Date End Date Taking? Authorizing Provider  aspirin EC 81 MG tablet Take 81 mg by mouth  daily.   Yes Historical Provider, MD  Calcium Carbonate-Vitamin D (CALTRATE 600+D) 600-400 MG-UNIT per tablet Take 1 tablet by mouth daily.   Yes Historical Provider, MD  glimepiride (AMARYL) 2 MG tablet TAKE 1 TABLET BY MOUTH IN THE MORNING AND 1/2 TABLET AT [facility] 02/17/15  Yes Elayne Snare, MD  insulin aspart (NOVOLOG FLEXPEN) 100 UNIT/ML FlexPen If blood sugar is over 200, take 5 units and if they go over 300, take 6 units at that meal. 02/16/15  Yes Elayne Snare, MD  levothyroxine (SYNTHROID, LEVOTHROID) 88 MCG tablet Take 1 tablet daily Patient taking differently: Take 88 mcg by mouth See admin instructions. Take 1 tablet daily except for on Sundays and Wednesdays do not take any 04/26/14  Yes Elayne Snare, MD  LYRICA 75 MG capsule TAKE 1 CAPSULE TWICE DAILY 01/02/15  Yes Elayne Snare, MD  Multiple Vitamin (MULTIVITAMIN WITH MINERALS) TABS Take 1 tablet by mouth daily.   Yes Historical Provider, MD  pravastatin (PRAVACHOL) 20 MG tablet  11/08/14  Yes Historical Provider, MD  traMADol (ULTRAM) 50 MG tablet Take 1 tablet (50 mg total) by mouth 2 (two) times daily. 11/21/14  Yes Elayne Snare, MD  ACCU-CHEK FASTCLIX LANCETS MISC  11/23/14   Historical Provider, MD  diclofenac sodium (VOLTAREN) 1 % GEL Apply 2 g topically 4 (four) times daily. Patient not taking: Reported on 03/02/2015 11/10/13   Elayne Snare, MD  Diclofenac Sodium 3 % GEL Place 15 mg onto the skin 2 (two) times daily. Use small amount on  knee Patient not taking: Reported on 03/02/2015 02/09/14   Elayne Snare, MD  glucose blood (ACCU-CHEK AVIVA PLUS) test strip Use to check blood sugar three times  a day 11/21/14   Elayne Snare, MD  Insulin Pen Needle (NOVOFINE) 32G X 6 MM MISC Use as directed 11/21/14   Elayne Snare, MD  Lancets Misc. (ACCU-CHEK FASTCLIX LANCET) KIT USE AS DIRECTED FOR TESTING 11/23/14   Elayne Snare, MD  omeprazole (PRILOSEC) 20 MG capsule Take 1 capsule (20 mg total) by mouth daily. Patient not taking: Reported on 03/02/2015 03/16/13   Bonnielee Haff, MD  psyllium (METAMUCIL) 58.6 % powder Take 1 packet by mouth as directed.    Historical Provider, MD   BP 177/47 mmHg  Pulse 66  Temp(Src) 98 F (36.7 C) (Oral)  Resp 16  Ht '5\' 5"'  (1.651 m)  Wt 141 lb (63.957 kg)  BMI 23.46 kg/m2  SpO2 99% Physical Exam  Constitutional: She is oriented to person, place, and time. She appears well-developed.  HENT:  Head: Normocephalic.  Eyes: Conjunctivae and EOM are normal. No scleral icterus.  Neck: Neck supple. No thyromegaly present.  Cardiovascular: Normal rate and regular rhythm.  Exam reveals no gallop and no friction rub.   No murmur heard. Pulmonary/Chest: No stridor. She has no wheezes. She has no rales. She exhibits no tenderness.  Abdominal: She exhibits no distension. There is no tenderness. There is no rebound.  Musculoskeletal: Normal range of motion. She exhibits no edema.  Lymphadenopathy:    She has no cervical adenopathy.  Neurological: She is oriented to person, place, and time. She exhibits normal muscle tone. Coordination normal.  Skin: No rash noted. No erythema.  Psychiatric: She has a normal mood and affect. Her behavior is normal.   Pt was orthostatic and dizzy when she stood up ED Course  Procedures (including critical care time) Labs Review Labs Reviewed  CBC - Abnormal; Notable for the following:    WBC 10.9 (*)    RBC 3.27 (*)    Hemoglobin 10.1 (*)    HCT 31.1 (*)    Platelets 145 (*)    All other components within normal limits  COMPREHENSIVE METABOLIC PANEL - Abnormal; Notable for the following:    Potassium 5.4 (*)    Glucose, Bld 238 (*)    BUN 37 (*)    Creatinine, Ser 1.41 (*)    Total Protein 5.7 (*)    Albumin 3.3 (*)    GFR calc non Af Amer 30 (*)    GFR calc Af Amer 35 (*)    Anion gap 4 (*)    All other components within normal limits  OCCULT BLOOD X 1 CARD TO LAB, STOOL  URINALYSIS, ROUTINE W REFLEX MICROSCOPIC  I-STAT TROPOININ, ED  I-STAT TROPOININ, ED  POC OCCULT BLOOD,  ED    Imaging Review Dg Chest 2 View  03/02/2015   CLINICAL DATA:  Dizziness and hypothyroidism  EXAM: CHEST  2 VIEW  COMPARISON:  March 15, 2013  FINDINGS: There are scattered calcified granulomas. There is scarring in the right lower lobe region as well as in each apical region. There is no edema or consolidation. The heart size and pulmonary vascularity are normal. There is a small hiatal hernia. No adenopathy. There is degenerative change at the thoracolumbar junction with lower thoracic levoscoliosis.  IMPRESSION: Areas of scarring, stable. Scattered calcified granulomas. No edema or consolidation. Small hiatal hernia.   Electronically Signed   By: Lowella Grip III  M.D.   On: 03/02/2015 12:29   Ct Head Wo Contrast  03/02/2015   CLINICAL DATA:  81 year old female with dizziness.  EXAM: CT HEAD WITHOUT CONTRAST  TECHNIQUE: Contiguous axial images were obtained from the base of the skull through the vertex without intravenous contrast.  COMPARISON:  No priors.  FINDINGS: Well-defined focus of low attenuation in the posterior aspect of the head of the right caudate nucleus extending into the overlying white matter, compatible with an old lacunar infarct. Patchy and confluent areas of decreased attenuation are noted throughout the deep and periventricular white matter of the cerebral hemispheres bilaterally, compatible with chronic microvascular ischemic disease. Mild cerebral atrophy, age appropriate. Numerous atherosclerotic calcifications in the visualize cerebral vasculature. No acute intracranial abnormalities. Specifically, no evidence of acute intracranial hemorrhage, no definite findings of acute/subacute cerebral ischemia, no mass, mass effect, hydrocephalus or abnormal intra or extra-axial fluid collections. Visualized paranasal sinuses and mastoids are well pneumatized, with exception of a small left mastoid effusion. No acute displaced skull fractures are identified.  IMPRESSION: 1. No acute  intracranial abnormalities. 2. Small left mastoid effusion. 3. Mild cerebral atrophy with chronic microvascular ischemic changes in the cerebral white matter and old right-sided lacunar infarct, as above. 4. Atherosclerosis.   Electronically Signed   By: Vinnie Langton M.D.   On: 03/02/2015 12:13     EKG Interpretation   Date/Time:  Thursday March 02 2015 10:27:19 EST Ventricular Rate:  71 PR Interval:  278 QRS Duration: 128 QT Interval:  419 QTC Calculation: 455 R Axis:   -88 Text Interpretation:  Sinus rhythm Atrial premature complex Prolonged PR  interval RBBB and LAFB Abnormal T, consider ischemia, lateral leads  Baseline wander in lead(s) V2 Confirmed by Garland Hincapie  MD, Adrea Sherpa [phone]) on  03/02/2015 10:40:31 AM      MDM   Final diagnoses:  Dizzy    Admit for othostasis    Maudry Diego, MD 03/02/15 1330

## 2015-03-02 NOTE — ED Notes (Signed)
Dr. Roderic Palau at the bedside during orthostatics. Pt states she normally uses a walker.

## 2015-03-02 NOTE — Progress Notes (Signed)
Patient ID: [name], female   DOB: 1915/02/02, 81 y.o.   MRN: [id number]  Subjective: 80 year old female returns the office they for  painful elongated toenails as also painful callus overlying the left bunion. She states the bunion continues to rubs in her shoe causing a callus and irritation. She denies any smoothing and redness or streaking or any signs of infection. Denies any systemic complaints as fevers, chills, nausea, vomiting. No other complaints at this time. No acute changes since last appointment.  Objective: AAO 3, NAD DP/PT pulses palpable bilaterally, CRT less than 3 seconds Protective sensation  decreased with Simms Weinstein monofilament, vibratory sensation decreased. Significant HAV deformity with left greater than right. There is a hyperkeratotic lesion overlying the medial aspect first metatarsal head overlying the prominent bunion which is pre-ulcerative. Upon debridement, no underlying ulceration/drainage or other clinical signs of infection. No surrounding erythema.  Nails hypertrophic, dystrophic, elongated, brittle, discolored 10. Nails are painful 1-5 bilaterally upon palpation of them. There is no surrounding erythema or drainage from the nail sites. Hammertoe contractures bilaterally MMT 5/5, ROM WNL No pain with calf compression, swelling, warmth, erythema. No open lesions or other pre-ulcerative lesions.   Assessment: 81 year old female with symptomatic onychomycosis and pre-ulcerative hyperkerotic lesion left foot  Plan: -Treatment options discussed including alternatives, risks, complications. -Nail sharply debrided 10 without complications. -Hyperkeratotic lesion sharply debrided 1 without complications. -Continue offloading pads for now. -Discussed diabetic shoes and inserts. Will start the precertification process to have these approved.  -Continue daily foot inspections. -Follow-up in 3 months or sooner if any problems are to arise. In the  meantime, call the office with any questions, concerns, change in symptoms.  *After the patient left was determined that she would likely need to go to [facility] for inserts and diabetic shoes. A prescription for this was mailed to her.

## 2015-03-02 NOTE — ED Notes (Signed)
Patient transported to CT 

## 2015-03-02 NOTE — ED Notes (Signed)
Per [facility], pt from home, lives by herself. Pt woke up at midnight to go to the bathroom and felt dizzy. Pt states it was worse when she stood up. Denies any pain. No deficits noted. Speech normal.

## 2015-03-02 NOTE — Progress Notes (Signed)
[facility] hospitalist progress note. Chief complaint. Syncope, GI bleed. History of present illness. This 81 year old female presented to [facility] emergency room with complaints of dizziness. She was admitted for further evaluation. Her admitting hemoglobin was 10.1 down from previous 12.1 in December 2015. Nursing called to inform me that the patient had a large bloody bowel movement followed by an episode of syncope while sitting on the toilet. Patient was placed back to bed and a blood pressure was obtained at 177/66. 12-lead EKG was also obtained which did not look significantly different from prior EKG. I came to the bedside and found the patient alert and in no distress. She denies any prior history of GI bleed. She denies any abdominal pain. She denies any chest pain or dyspnea. Vital signs. Temperature 98.9, pulse 64, respiration 22, blood pressure 135/54. O2 sats 96%. General appearance. Frail elderly female who is alert and in no distress. Cardiac. Rate and rhythm regular. Lungs. Breath sounds clear and equal. Abdomen. Soft with positive bowel sounds in all 4 quadrants. Diffuse pain with palpation essentially in all 4 quadrants. No guarding or rebound tenderness. No evidence of acute abdomen. Impression/plan. Problem #1. Syncope. No indication of chest pain. First troponin negative. We'll follow for further troponins. Last hemoglobin 10.1. We'll obtain a stat hemoglobin and hematocrit now and then every 6 hours for a total of 5 sets. A full CBC is scheduled for the morning labs. Nursing will call me this result and I will transfuse if needed. I suspect syncope secondary to current GI bleed. Also may be a component of vasovagal response. I will contact GI to arrange a consult for this patient. I've made her nothing by mouth. Will start on a Protonix drip. I believe the patient is stable enough to remain in her current bed assignment on telemetry but if further bleeding or hypotension presents then  she would need to move to step down.

## 2015-03-02 NOTE — ED Notes (Signed)
Dr. Roderic Palau back at the bedside with family

## 2015-03-02 NOTE — ED Notes (Signed)
EDP at bedside  

## 2015-03-02 NOTE — ED Notes (Signed)
Family at bedside. 

## 2015-03-02 NOTE — H&P (Addendum)
[facility] History and Physical  [name] QQI:[id number] DOB: [birth year] DOA: 03/02/2015  Referring physician: er PCP: Elayne Snare, MD   Chief Complaint: dizziness  HPI: [name] is a 81 y.o. female  Who lives home alone and gets around well with her walker.  She presents to the ER with family after waking up with dizziness this AM when she was going to the bathroom.  Dizziness is NOT just when she stands up.  She also c/o frontal head ache.  She went back to bed and continued to have dizziness- she felt as if SHE was spinning.  Last PM she went to [place] with friends and had no issues.  No other leg weakness or arm weakness or slurring of speech.  No chest pain, no SOB, no new meds, no sick contacts  She was found to have positive orthostatics in the ER and given 500cc with no improvement.  She had a head CT that did not show any new infarcts.  Her Hgb was slightly low but heme negative. Hospitalist were asked to admit to work up the dizziness.   Review of Systems:  All systems reviewed, negative unless stated above   Past Medical History  Diagnosis Date  . Diabetes mellitus without complication   . Tuberculosis     During the Great Depression  . Hypothyroidism    Past Surgical History  Procedure Laterality Date  . Hip fracture surgery      both hips   Social History:  reports that she has never smoked. She does not have any smokeless tobacco history on file. She reports that she does not drink alcohol or use illicit drugs.  Allergies  Allergen Reactions  . Other     Ground red pepper lotion to rub on her knee caused SOB    Family History  Problem Relation Age of Onset  . Diabetes Father     Prior to Admission medications   Medication Sig Start Date End Date Taking? Authorizing Provider  aspirin EC 81 MG tablet Take 81 mg by mouth daily.   Yes Historical Provider, MD  Calcium Carbonate-Vitamin D (CALTRATE 600+D) 600-400 MG-UNIT per tablet  Take 1 tablet by mouth daily.   Yes Historical Provider, MD  glimepiride (AMARYL) 2 MG tablet TAKE 1 TABLET BY MOUTH IN THE MORNING AND 1/2 TABLET AT [facility] 02/17/15  Yes Elayne Snare, MD  insulin aspart (NOVOLOG FLEXPEN) 100 UNIT/ML FlexPen If blood sugar is over 200, take 5 units and if they go over 300, take 6 units at that meal. 02/16/15  Yes Elayne Snare, MD  levothyroxine (SYNTHROID, LEVOTHROID) 88 MCG tablet Take 1 tablet daily Patient taking differently: Take 88 mcg by mouth See admin instructions. Take 1 tablet daily except for on Sundays and Wednesdays do not take any 04/26/14  Yes Elayne Snare, MD  LYRICA 75 MG capsule TAKE 1 CAPSULE TWICE DAILY 01/02/15  Yes Elayne Snare, MD  Multiple Vitamin (MULTIVITAMIN WITH MINERALS) TABS Take 1 tablet by mouth daily.   Yes Historical Provider, MD  pravastatin (PRAVACHOL) 20 MG tablet  11/08/14  Yes Historical Provider, MD  traMADol (ULTRAM) 50 MG tablet Take 1 tablet (50 mg total) by mouth 2 (two) times daily. 11/21/14  Yes Elayne Snare, MD  ACCU-CHEK FASTCLIX LANCETS MISC  11/23/14   Historical Provider, MD  diclofenac sodium (VOLTAREN) 1 % GEL Apply 2 g topically 4 (four) times daily. Patient not taking: Reported on 03/02/2015 11/10/13   Elayne Snare, MD  Diclofenac  Sodium 3 % GEL Place 15 mg onto the skin 2 (two) times daily. Use small amount on knee Patient not taking: Reported on 03/02/2015 02/09/14   Elayne Snare, MD  glucose blood (ACCU-CHEK AVIVA PLUS) test strip Use to check blood sugar three times  a day 11/21/14   Elayne Snare, MD  Insulin Pen Needle (NOVOFINE) 32G X 6 MM MISC Use as directed 11/21/14   Elayne Snare, MD  Lancets Misc. (ACCU-CHEK FASTCLIX LANCET) KIT USE AS DIRECTED FOR TESTING 11/23/14   Elayne Snare, MD  omeprazole (PRILOSEC) 20 MG capsule Take 1 capsule (20 mg total) by mouth daily. Patient not taking: Reported on 03/02/2015 03/16/13   Bonnielee Haff, MD  psyllium (METAMUCIL) 58.6 % powder Take 1 packet by mouth as directed.    Historical Provider,  MD   Physical Exam: Filed Vitals:   03/02/15 1020 03/02/15 1100 03/02/15 1130 03/02/15 1145  BP: 185/62 177/47 148/42 139/45  Pulse: 65 66 62 63  Temp: 98 F (36.7 C)     TempSrc: Oral     Resp: _0 Height: _1  (1.651 m)     Weight: 63.957 kg (141 lb)     SpO2: 99% 99% 100% 98%    Wt Readings from Last 3 Encounters:  03/02/15 63.957 kg (141 lb)  02/20/15 63.957 kg (141 lb)  12/15/14 63.594 kg (140 lb 3.2 oz)    General:  Appears calm and comfortable Eyes: PERRL, normal lids, irises & conjunctiva ENT: grossly normal hearing, lips & tongue Neck: no LAD, masses or thyromegaly Cardiovascular: RRR, no m/r/g. No LE edema. Telemetry: SR, no arrhythmias  Respiratory: CTA bilaterally, no w/r/r. Normal respiratory effort. Abdomen: soft, ntnd Skin: no rash or induration seen on limited exam Musculoskeletal: grossly normal tone BUE/BLE Psychiatric: grossly normal mood and affect, speech fluent and appropriate Neurologic: grossly non-focal.          Labs on Admission:  Basic Metabolic Panel:  Recent Labs Lab 03/02/15 1030  NA 140  K 5.4*  CL 109  CO2 27  GLUCOSE 238*  BUN 37*  CREATININE 1.41*  CALCIUM 8.7   Liver Function Tests:  Recent Labs Lab 03/02/15 1030  AST 21  ALT 14  ALKPHOS 63  BILITOT 0.3  PROT 5.7*  ALBUMIN 3.3*   No results for input(s): LIPASE, AMYLASE in the last 168 hours. No results for input(s): AMMONIA in the last 168 hours. CBC:  Recent Labs Lab 03/02/15 1030  WBC 10.9*  HGB 10.1*  HCT 31.1*  MCV 95.1  PLT 145*   Cardiac Enzymes: No results for input(s): CKTOTAL, CKMB, CKMBINDEX, TROPONINI in the last 168 hours.  BNP (last 3 results) No results for input(s): BNP in the last 8760 hours.  ProBNP (last 3 results) No results for input(s): PROBNP in the last 8760 hours.  CBG: No results for input(s): GLUCAP in the last 168 hours.  Radiological Exams on Admission: Dg Chest 2 View  03/02/2015   CLINICAL DATA:   Dizziness and hypothyroidism  EXAM: CHEST  2 VIEW  COMPARISON:  March 15, 2013  FINDINGS: There are scattered calcified granulomas. There is scarring in the right lower lobe region as well as in each apical region. There is no edema or consolidation. The heart size and pulmonary vascularity are normal. There is a small hiatal hernia. No adenopathy. There is degenerative change at the thoracolumbar junction with lower thoracic levoscoliosis.  IMPRESSION: Areas of scarring, stable. Scattered calcified granulomas. No edema or  consolidation. Small hiatal hernia.   Electronically Signed   By: Lowella Grip III M.D.   On: 03/02/2015 12:29   Ct Head Wo Contrast  03/02/2015   CLINICAL DATA:  81 year old female with dizziness.  EXAM: CT HEAD WITHOUT CONTRAST  TECHNIQUE: Contiguous axial images were obtained from the base of the skull through the vertex without intravenous contrast.  COMPARISON:  No priors.  FINDINGS: Well-defined focus of low attenuation in the posterior aspect of the head of the right caudate nucleus extending into the overlying white matter, compatible with an old lacunar infarct. Patchy and confluent areas of decreased attenuation are noted throughout the deep and periventricular white matter of the cerebral hemispheres bilaterally, compatible with chronic microvascular ischemic disease. Mild cerebral atrophy, age appropriate. Numerous atherosclerotic calcifications in the visualize cerebral vasculature. No acute intracranial abnormalities. Specifically, no evidence of acute intracranial hemorrhage, no definite findings of acute/subacute cerebral ischemia, no mass, mass effect, hydrocephalus or abnormal intra or extra-axial fluid collections. Visualized paranasal sinuses and mastoids are well pneumatized, with exception of a small left mastoid effusion. No acute displaced skull fractures are identified.  IMPRESSION: 1. No acute intracranial abnormalities. 2. Small left mastoid effusion. 3. Mild  cerebral atrophy with chronic microvascular ischemic changes in the cerebral white matter and old right-sided lacunar infarct, as above. 4. Atherosclerosis.   Electronically Signed   By: Vinnie Langton M.D.   On: 03/02/2015 12:13    EKG: Independently reviewed. Sinus with RBBB- can not compare to old EKGs  Assessment/Plan Active Problems:   Hypothyroidism   DM type 2 (diabetes mellitus, type 2)   Diabetic neuropathy   Chronic kidney disease, stage III (moderate)   Dizzy   Dizziness- + orthos in ER but patient did not report dizziness when getting up.  Dizziness started while laying in bed.  MRI to r/o celebellar CVA, PT vestibular eval, no nausea, U/A to r/o UTI  DM- SSI, hold oral agent for now  Neuropathy- on lyrica- not a new med  CKD- baseline 1.5-1.6  Mild hyperkalemia- IVF and recheck in AM- had BM this AM  Mild leukocytosis -check U/A   Code Status: full (discussed with family) DVT Prophylaxis: Family Communication: patient and son Disposition Plan:   Time spent: 39 min  Eulogio Bear [facility] Pager [phone]

## 2015-03-02 NOTE — ED Notes (Signed)
Tray ordered for patient.

## 2015-03-02 NOTE — Progress Notes (Signed)
Hemoglobin 7.1.  Patient still complaining of dizziness while lying in bed, but in no acute distress.  BP 112/35, HR 75.  Notified Rogue Bussing, NP regarding hgb results as requested earlier.  New orders received for 1 unit of PRBCs.  Will continue to monitor.

## 2015-03-03 ENCOUNTER — Encounter (HOSPITAL_COMMUNITY): Payer: Self-pay | Admitting: *Deleted

## 2015-03-03 ENCOUNTER — Observation Stay (HOSPITAL_COMMUNITY): Payer: Commercial Managed Care - HMO

## 2015-03-03 ENCOUNTER — Encounter (HOSPITAL_COMMUNITY)
Admission: EM | Disposition: A | Discharge status: Skilled Nursing Facility | Payer: Self-pay | Source: Home / Self Care | Attending: Internal Medicine

## 2015-03-03 DIAGNOSIS — E1142 Type 2 diabetes mellitus with diabetic polyneuropathy: Secondary | ICD-10-CM | POA: Diagnosis present

## 2015-03-03 DIAGNOSIS — R1310 Dysphagia, unspecified: Secondary | ICD-10-CM | POA: Diagnosis present

## 2015-03-03 DIAGNOSIS — K250 Acute gastric ulcer with hemorrhage: Principal | ICD-10-CM

## 2015-03-03 DIAGNOSIS — E039 Hypothyroidism, unspecified: Secondary | ICD-10-CM | POA: Diagnosis present

## 2015-03-03 DIAGNOSIS — D62 Acute posthemorrhagic anemia: Secondary | ICD-10-CM

## 2015-03-03 DIAGNOSIS — Z7982 Long term (current) use of aspirin: Secondary | ICD-10-CM | POA: Diagnosis not present

## 2015-03-03 DIAGNOSIS — Z79899 Other long term (current) drug therapy: Secondary | ICD-10-CM | POA: Diagnosis not present

## 2015-03-03 DIAGNOSIS — N183 Chronic kidney disease, stage 3 (moderate): Secondary | ICD-10-CM | POA: Diagnosis present

## 2015-03-03 DIAGNOSIS — I639 Cerebral infarction, unspecified: Secondary | ICD-10-CM | POA: Diagnosis present

## 2015-03-03 DIAGNOSIS — D72829 Elevated white blood cell count, unspecified: Secondary | ICD-10-CM | POA: Diagnosis present

## 2015-03-03 DIAGNOSIS — Z794 Long term (current) use of insulin: Secondary | ICD-10-CM | POA: Diagnosis not present

## 2015-03-03 DIAGNOSIS — E1122 Type 2 diabetes mellitus with diabetic chronic kidney disease: Secondary | ICD-10-CM | POA: Diagnosis present

## 2015-03-03 DIAGNOSIS — I6350 Cerebral infarction due to unspecified occlusion or stenosis of unspecified cerebral artery: Secondary | ICD-10-CM

## 2015-03-03 DIAGNOSIS — M1990 Unspecified osteoarthritis, unspecified site: Secondary | ICD-10-CM | POA: Diagnosis present

## 2015-03-03 DIAGNOSIS — Z8611 Personal history of tuberculosis: Secondary | ICD-10-CM | POA: Diagnosis not present

## 2015-03-03 DIAGNOSIS — R42 Dizziness and giddiness: Secondary | ICD-10-CM | POA: Diagnosis present

## 2015-03-03 DIAGNOSIS — E875 Hyperkalemia: Secondary | ICD-10-CM | POA: Diagnosis present

## 2015-03-03 DIAGNOSIS — Z91048 Other nonmedicinal substance allergy status: Secondary | ICD-10-CM | POA: Diagnosis not present

## 2015-03-03 DIAGNOSIS — K2961 Other gastritis with bleeding: Secondary | ICD-10-CM

## 2015-03-03 HISTORY — PX: ESOPHAGOGASTRODUODENOSCOPY: SHX5428

## 2015-03-03 LAB — BASIC METABOLIC PANEL
ANION GAP: 5 (ref 5–15)
BUN: 39 mg/dL — ABNORMAL HIGH (ref 6–23)
CHLORIDE: 112 mmol/L (ref 96–112)
CO2: 21 mmol/L (ref 19–32)
CREATININE: 1.37 mg/dL — AB (ref 0.50–1.10)
Calcium: 8.1 mg/dL — ABNORMAL LOW (ref 8.4–10.5)
GFR calc Af Amer: 36 mL/min — ABNORMAL LOW (ref >90)
GFR calc non Af Amer: 31 mL/min — ABNORMAL LOW (ref >90)
Glucose, Bld: 91 mg/dL (ref 70–99)
Potassium: 4.8 mmol/L (ref 3.5–5.1)
SODIUM: 138 mmol/L (ref 135–145)

## 2015-03-03 LAB — CBC
HCT: 20.1 % — ABNORMAL LOW (ref 36.0–46.0)
Hemoglobin: 6.8 g/dL — CL (ref 12.0–15.0)
MCH: 31.2 pg (ref 26.0–34.0)
MCHC: 33.8 g/dL (ref 30.0–36.0)
MCV: 92.2 fL (ref 78.0–100.0)
PLATELETS: 111 10*3/uL — AB (ref 150–400)
RBC: 2.18 MIL/uL — AB (ref 3.87–5.11)
RDW: 13.5 % (ref 11.5–15.5)
WBC: 8.6 10*3/uL (ref 4.0–10.5)

## 2015-03-03 LAB — GLUCOSE, CAPILLARY
GLUCOSE-CAPILLARY: 128 mg/dL — AB (ref 70–99)
GLUCOSE-CAPILLARY: 146 mg/dL — AB (ref 70–99)
GLUCOSE-CAPILLARY: 159 mg/dL — AB (ref 70–99)
GLUCOSE-CAPILLARY: 184 mg/dL — AB (ref 70–99)
GLUCOSE-CAPILLARY: 335 mg/dL — AB (ref 70–99)
GLUCOSE-CAPILLARY: 82 mg/dL (ref 70–99)
Glucose-Capillary: 141 mg/dL — ABNORMAL HIGH (ref 70–99)

## 2015-03-03 LAB — HEMOGLOBIN AND HEMATOCRIT, BLOOD
HCT: 20.6 % — ABNORMAL LOW (ref 36.0–46.0)
HCT: 25.8 % — ABNORMAL LOW (ref 36.0–46.0)
Hemoglobin: 6.7 g/dL — CL (ref 12.0–15.0)
Hemoglobin: 8.7 g/dL — ABNORMAL LOW (ref 12.0–15.0)

## 2015-03-03 LAB — TROPONIN I
Troponin I: <0.03 ng/mL (ref <0.031)
Troponin I: 0.03 ng/mL (ref <0.031)

## 2015-03-03 LAB — OCCULT BLOOD X 1 CARD TO LAB, STOOL: FECAL OCCULT BLD: POSITIVE — AB

## 2015-03-03 LAB — PREPARE RBC (CROSSMATCH)

## 2015-03-03 SURGERY — EGD (ESOPHAGOGASTRODUODENOSCOPY)
Anesthesia: Moderate Sedation

## 2015-03-03 MED ORDER — TRAMADOL HCL 50 MG PO TABS
50.0000 mg | ORAL_TABLET | Freq: Two times a day (BID) | ORAL | Status: DC | PRN
  Administered 2015-03-06: 50 mg via ORAL
  Filled 2015-03-03: qty 1

## 2015-03-03 MED ORDER — SODIUM CHLORIDE 0.9 % IV SOLN: INTRAVENOUS | Status: DC

## 2015-03-03 MED ORDER — MIDAZOLAM HCL 5 MG/ML IJ SOLN
INTRAMUSCULAR | Status: AC
  Filled 2015-03-03: qty 1

## 2015-03-03 MED ORDER — FENTANYL CITRATE 0.05 MG/ML IJ SOLN
INTRAMUSCULAR | Status: AC
  Filled 2015-03-03: qty 2

## 2015-03-03 MED ORDER — MIDAZOLAM HCL 5 MG/5ML IJ SOLN
INTRAMUSCULAR | Status: DC | PRN
  Administered 2015-03-03: 1 mg via INTRAVENOUS

## 2015-03-03 MED ORDER — BUTAMBEN-TETRACAINE-BENZOCAINE 2-2-14 % EX AERO
INHALATION_SPRAY | CUTANEOUS | Status: DC | PRN
  Administered 2015-03-03: 2 via TOPICAL

## 2015-03-03 MED ORDER — FENTANYL CITRATE 0.05 MG/ML IJ SOLN
INTRAMUSCULAR | Status: DC | PRN
  Administered 2015-03-03: 12.5 ug via INTRAVENOUS

## 2015-03-03 MED ORDER — ACETAMINOPHEN 325 MG PO TABS
650.0000 mg | ORAL_TABLET | Freq: Four times a day (QID) | ORAL | Status: DC | PRN
  Administered 2015-03-03: 650 mg via ORAL
  Filled 2015-03-03: qty 2

## 2015-03-03 NOTE — Progress Notes (Signed)
See EGD report.  Patient is bleeding from a very proximal gastric ulcer that is a part of a slightly abnormal appearing fold, just beyond the GE junction.  A visible vessel was clipped.  The patient will need a follow-up endoscopy for biopsies of this area to rule out neoplasm.

## 2015-03-03 NOTE — Progress Notes (Addendum)
[facility] PROGRESS NOTE  [name] XBD:[id number] DOB: October 09, 1915 DOA: 03/02/2015 PCP: Elayne Snare, MD  Assessment/Plan:  Principal Problem:   Acute gastric ulcer on abnormal gastric fold with bleeding, s/p clips: h/h stable since yesterday. Reportedly had bloody stool this am. Hopefully, "old blood".  Blood pressure stable.  Will need repeat endo as outpt to ensure healing and possible bx of area. Advance to soft diet. Change ppi to po after 72 hours drip Active Problems:   Acute ischemic stroke: MRI shows ACUTE right cerebellar CVA, which could explain vertigo. Symptoms improved. Continue tele. Check echo, carotid doppers, hgb a1c. LDL 53.  No antiplatelets due to GIB.  Elderly, but lives alone and very independent.  Neuro checks. Pt/ot eval pending. Discussed with ST. No swallowing/speech or cognition issues.    Hypothyroidism   DM type 2 (diabetes mellitus, type 2) stable on SSI   Diabetic neuropathy   Chronic kidney disease, stage III (moderate)   Vertigo, syncope, multifactorial: due to GIB AND acute cerebellar stroke. Symptoms improved   Acute blood loss anemia: s/p 2 units pRBC   DM (diabetes mellitus), type 2 with renal complications: SSI   Code Status:  full Family Communication:  At bedside Disposition Plan:  Home? When stable and w/u, therapy evals complete  Consultants:  GI  Procedures:   EGD, bx, clip  Antibiotics:    HPI/Subjective: Thinks she had "red" stool this morning, but unsure.  Some orthostatic dizziness, no vertigo. Has not ambulated yet. No nausea   Objective: Filed Vitals:   03/03/15 2100  BP: 145/41  Pulse: 59  Temp: 98.2 F (36.8 C)  Resp: 20    Intake/Output Summary (Last 24 hours) at 03/03/15 2253 Last data filed at 03/03/15 2234  Gross per 24 hour  Intake   1453 ml  Output    300 ml  Net   1153 ml   Filed Weights   03/02/15 1020 03/02/15 2030 03/03/15 2100  Weight: 63.957 kg (141 lb) 64.547 kg (142 lb 4.8 oz) 65.953  kg (145 lb 6.4 oz)    Exam:   General:  A, o, talkative  HEENT: no nystagmus. MMM  Cardiovascular: RRR without MGR  Respiratory: CTA without WRR  Abdomen: S, NT, ND  Ext: no CCE Neuro: CN intact, motor 5/5, finger to nose normal  Basic Metabolic Panel:  Recent Labs Lab 03/02/15 1030 03/03/15 0237  NA 140 138  K 5.4* 4.8  CL 109 112  CO2 27 21  GLUCOSE 238* 91  BUN 37* 39*  CREATININE 1.41* 1.37*  CALCIUM 8.7 8.1*   Liver Function Tests:  Recent Labs Lab 03/02/15 1030  AST 21  ALT 14  ALKPHOS 63  BILITOT 0.3  PROT 5.7*  ALBUMIN 3.3*   No results for input(s): LIPASE, AMYLASE in the last 168 hours. No results for input(s): AMMONIA in the last 168 hours. CBC:  Recent Labs Lab 03/02/15 1030 03/02/15 2157 03/03/15 0208 03/03/15 0237 03/03/15 1050  WBC 10.9*  --   --  8.6  --   HGB 10.1* 7.1* 6.7* 6.8* 8.7*  HCT 31.1* 21.5* 20.6* 20.1* 25.8*  MCV 95.1  --   --  92.2  --   PLT 145*  --   --  111*  --    Cardiac Enzymes:  Recent Labs Lab 03/02/15 1837 03/02/15 2359 03/03/15 0237  TROPONINI <0.03 <0.03 0.03   BNP (last 3 results) No results for input(s): BNP in the last 8760 hours.  ProBNP (last  3 results) No results for input(s): PROBNP in the last 8760 hours. Lipid Panel     Component Value Date/Time   CHOL 110 03/04/2015 0454   TRIG 86 03/04/2015 0454   HDL 40 03/04/2015 0454   CHOLHDL 2.8 03/04/2015 0454   VLDL 17 03/04/2015 0454   LDLCALC 53 03/04/2015 0454    CBG:  Recent Labs Lab 03/03/15 0413 03/03/15 0818 03/03/15 1202 03/03/15 1745 03/03/15 2059  GLUCAP 128* 184* 141* 146* 159*    No results found for this or any previous visit (from the past 240 hour(s)).   Studies: Dg Chest 2 View  03/02/2015   CLINICAL DATA:  Dizziness and hypothyroidism  EXAM: CHEST  2 VIEW  COMPARISON:  March 15, 2013  FINDINGS: There are scattered calcified granulomas. There is scarring in the right lower lobe region as well as in each  apical region. There is no edema or consolidation. The heart size and pulmonary vascularity are normal. There is a small hiatal hernia. No adenopathy. There is degenerative change at the thoracolumbar junction with lower thoracic levoscoliosis.  IMPRESSION: Areas of scarring, stable. Scattered calcified granulomas. No edema or consolidation. Small hiatal hernia.   Electronically Signed   By: Lowella Grip III M.D.   On: 03/02/2015 12:29   Ct Head Wo Contrast  03/02/2015   CLINICAL DATA:  81 year old female with dizziness.  EXAM: CT HEAD WITHOUT CONTRAST  TECHNIQUE: Contiguous axial images were obtained from the base of the skull through the vertex without intravenous contrast.  COMPARISON:  No priors.  FINDINGS: Well-defined focus of low attenuation in the posterior aspect of the head of the right caudate nucleus extending into the overlying white matter, compatible with an old lacunar infarct. Patchy and confluent areas of decreased attenuation are noted throughout the deep and periventricular white matter of the cerebral hemispheres bilaterally, compatible with chronic microvascular ischemic disease. Mild cerebral atrophy, age appropriate. Numerous atherosclerotic calcifications in the visualize cerebral vasculature. No acute intracranial abnormalities. Specifically, no evidence of acute intracranial hemorrhage, no definite findings of acute/subacute cerebral ischemia, no mass, mass effect, hydrocephalus or abnormal intra or extra-axial fluid collections. Visualized paranasal sinuses and mastoids are well pneumatized, with exception of a small left mastoid effusion. No acute displaced skull fractures are identified.  IMPRESSION: 1. No acute intracranial abnormalities. 2. Small left mastoid effusion. 3. Mild cerebral atrophy with chronic microvascular ischemic changes in the cerebral white matter and old right-sided lacunar infarct, as above. 4. Atherosclerosis.   Electronically Signed   By: Vinnie Langton  M.D.   On: 03/02/2015 12:13   Mr Brain Wo Contrast  03/03/2015   CLINICAL DATA:  Sudden onset of dizziness. Frontal headache. Vertigo.  EXAM: MRI HEAD WITHOUT CONTRAST  TECHNIQUE: Multiplanar, multiecho pulse sequences of the brain and surrounding structures were obtained without intravenous contrast.  COMPARISON:  CT head without contrast from the same day.  FINDINGS: An acute nonhemorrhagic infarct is present within the superior right cerebellum measuring 10 x 18 mm. This infarct. Remote on the CT scan. T2 and FLAIR hyperintensity is associated.  Moderate atrophy and periventricular white matter changes are noted otherwise. There is a remote lacunar infarct within the right centrum semi ovale and another and the more anterior right coronal radiata. No significant extraaxial fluid collection is present.  Flow is present in the major intracranial arteries. The patient is status post bilateral lens replacements. The paranasal sinuses and scratch the the paranasal sinuses are clear. There is some fluid in the  mastoid air cells bilaterally. No obstructing nasopharyngeal lesion is evident.  IMPRESSION: 1. Acute nonhemorrhagic infarct within the right superior cerebellum corresponds with the patient's acute vertigo and dizziness. 2. Moderate atrophy and white matter changes otherwise likely reflect expected changes of chronic microvascular ischemia.   Electronically Signed   By: San Morelle M.D.   On: 03/03/2015 10:35    Scheduled Meds: . sodium chloride   Intravenous Once  . insulin aspart  0-9 Units Subcutaneous 6 times per day  . levothyroxine  88 mcg Oral Once per day on Mon Tue Thu Fri Sat  . [START ON 03/06/2015] pantoprazole (PROTONIX) IV  40 mg Intravenous Q12H  . pravastatin  20 mg Oral q1800  . pregabalin  75 mg Oral BID  . sodium chloride  3 mL Intravenous Q12H   Continuous Infusions: . pantoprozole (PROTONIX) infusion 8 mg/hr (03/02/15 2232)    Time spent: 25  minutes  [facility] [email or web address], password [facility] 03/03/2015, 10:53 PM  LOS: 0 days

## 2015-03-03 NOTE — Progress Notes (Signed)
PT Cancellation Note  Patient Details Name: [name] MRN: [id number] DOB: 06/07/1915   Cancelled Treatment:    Reason Eval/Treat Not Completed: Patient at procedure or test/unavailable.  Attempted to see patient x2 today.  Nursing attending to patient, and then patient out of room for procedure.  Will return tomorrow for PT evaluation.   Despina Pole 03/03/2015, 2:52 PM Carita Pian. Sanjuana Kava, [place] Pager [phone]

## 2015-03-03 NOTE — Progress Notes (Signed)
Late entry: At the beginning of evening shift, patient had several large bloody clots while sitting upon the bedside commode and experienced a syncopal episode.  Vital signs taken: BP in the 180s, HR 140's, 100% O2 on RA.  Patient also complaining of abdominal pain at this time.  Rapid response RN called and came to assess patient.  Stat EKG ordered and completed.  New orders received from [facility] NP, who assessed the patient in person.  Protonix drip ordered and started.  Positive hemoccult.  Will continue to assess.

## 2015-03-03 NOTE — Op Note (Signed)
[facility] [place], [id number]   ENDOSCOPY PROCEDURE REPORT  PATIENT: [name], [name]  MR#: [id number] BIRTHDATE: 09-20-1915 , 89  yrs. old GENDER: female ENDOSCOPIST: Inda Castle, MD REFERRED BY: PROCEDURE DATE:  03/03/2015 PROCEDURE:  EGD w/ control of bleeding ASA CLASS:     Class III INDICATIONS:  melena. MEDICATIONS: Versed 1 mg IV and Fentanyl 25 mcg IV TOPICAL ANESTHETIC: Cetacaine Spray  DESCRIPTION OF PROCEDURE: After the risks benefits and alternatives of the procedure were thoroughly explained, informed consent was obtained.  The PENTAX GASTOROSCOPE [id number] endoscope was introduced through the mouth and advanced to the second portion of the duodenum , Without limitations.  The instrument was slowly withdrawn as the mucosa was fully examined.    Just beyond the GE junction was an enlarged gastric fold  with an ulcer at one and.  Overlying mucosa was smooth..  Ulcer measured approximately 1 cm.  In the retroflexed view there was oozing seen from a visible vessel within the ulcer.  2 endoscopic clips were applied to the visible vessel and hemostasis was achieved.  The enlarge fold was not biopsied at this time.   Except for the findings listed the EGD was otherwise normal.  Retroflexed views revealed as previously described.     The scope was then withdrawn from the patient and the procedure completed.  COMPLICATIONS: There were no immediate complications.  ENDOSCOPIC IMPRESSION: 1.  abnormal appearing gastric fold  just beyond the GE junction with ulceration and bleeding?"status post placement of endoscopic clips with hemostasis  RECOMMENDATIONS: 1.  continue twice a day up 2.  Follow-up endoscopy when patient is stabilized for biopsy of the abnormal gastric fold  REPEAT EXAM:  eSigned:  Inda Castle, MD 03/03/2015 3:45 PM Revised: 03/03/2015 3:45 PM   CC: Elayne Snare, MD

## 2015-03-03 NOTE — Progress Notes (Signed)
Hgb 6.8.  Patient complaining of some burning in abdominal area.  Alert and oriented.  BP 133/42, HR 83.  Rogue Bussing NP notified of new lab result.  No new orders received.  Called Blood Bank regarding patient's unit of blood.  They stated the blood is not ready at this time, but will notify RN when it is.  Will continue to monitor.

## 2015-03-03 NOTE — Consult Note (Signed)
[facility] Consult: 10:32 AM 03/03/2015     Referring Provider: Dr Conley Canal  Primary Care Physician:  Elayne Snare, MD Primary Gastroenterologist:  Althia Forts.      Reason for Consultation:  GI bleed.    HPI: [name] is a 81 y.o. female.  Lives indepentently alone at home.  Hx CKD. DM 2 on oral agent and prn insulin.  Hypothyroidism, bradycardia in 2014.  On Omeprazole.   Brought to ED due to dizziness starting yesterday AM, + headache. Orthostatic in ED.  Head CT with mild atrophy and chronic microvascular ischemia, mastoid effusion, no CVA.  Hgb was 10.1 but has since dropped to 6.7.  Was 12.1 in mid 11/2014.  Platelets 111. BUN/creatinine in line with previous readings.  Stool is FOBT +. Has been transfused one unit PRBCs.  Said she has had dark stools in past but mostly due to eating greens. About one month ago saw some blood with wiping after a BM.  Has brown, formed stools about every other day.  Not using oral NSAIDs. No abdominal pain. No anorexia. No dysphagia.    Past Medical History  Diagnosis Date  . Hypothyroidism   . Type II diabetes mellitus   . Tuberculosis 1938    During the Great Depression  . Migraines     "a long long time ago"  . Arthritis     "a little; in my joints" (03/02/2015)    Past Surgical History  Procedure Laterality Date  . Hip fracture surgery Bilateral   . Fracture surgery    . Tonsillectomy    . Cataract extraction w/ intraocular lens  implant, bilateral Bilateral     Prior to Admission medications   Medication Sig Start Date End Date Taking? Authorizing Provider  aspirin EC 81 MG tablet Take 81 mg by mouth daily.   Yes Historical Provider, MD  Calcium Carbonate-Vitamin D (CALTRATE 600+D) 600-400 MG-UNIT per tablet Take 1 tablet by mouth daily.   Yes  Historical Provider, MD  glimepiride (AMARYL) 2 MG tablet TAKE 1 TABLET BY MOUTH IN THE MORNING AND 1/2 TABLET AT [facility] 02/17/15  Yes Elayne Snare, MD  insulin aspart (NOVOLOG FLEXPEN) 100 UNIT/ML FlexPen If blood sugar is over 200, take 5 units and if they go over 300, take 6 units at that meal. 02/16/15  Yes Elayne Snare, MD  levothyroxine (SYNTHROID, LEVOTHROID) 88 MCG tablet Take 1 tablet daily Patient taking differently: Take 88 mcg by mouth See admin instructions. Take 1 tablet daily except for on Sundays and Wednesdays do not take any 04/26/14  Yes Elayne Snare, MD  LYRICA 75 MG capsule TAKE 1 CAPSULE TWICE DAILY 01/02/15  Yes Elayne Snare, MD  Multiple Vitamin (MULTIVITAMIN WITH MINERALS) TABS Take 1 tablet by mouth daily.   Yes Historical Provider, MD  pravastatin (PRAVACHOL) 20 MG tablet  11/08/14  Yes Historical Provider, MD  traMADol (ULTRAM) 50 MG tablet Take 1 tablet (50 mg total) by mouth 2 (two) times daily. 11/21/14  Yes Elayne Snare, MD  ACCU-CHEK FASTCLIX LANCETS MISC  11/23/14  Historical Provider, MD  diclofenac sodium (VOLTAREN) 1 % GEL Apply 2 g topically 4 (four) times daily. Patient not taking: Reported on 03/02/2015 11/10/13   Elayne Snare, MD  Diclofenac Sodium 3 % GEL Place 15 mg onto the skin 2 (two) times daily. Use small amount on knee Patient not taking: Reported on 03/02/2015 02/09/14   Elayne Snare, MD  glucose blood (ACCU-CHEK AVIVA PLUS) test strip Use to check blood sugar three times  a day 11/21/14   Elayne Snare, MD  Insulin Pen Needle (NOVOFINE) 32G X 6 MM MISC Use as directed 11/21/14   Elayne Snare, MD  Lancets Misc. (ACCU-CHEK FASTCLIX LANCET) KIT USE AS DIRECTED FOR TESTING 11/23/14   Elayne Snare, MD  omeprazole (PRILOSEC) 20 MG capsule Take 1 capsule (20 mg total) by mouth daily. Patient not taking: Reported on 03/02/2015 03/16/13   Bonnielee Haff, MD  psyllium (METAMUCIL) 58.6 % powder Take 1 packet by mouth as directed.    Historical Provider, MD    Scheduled Meds: . sodium  chloride   Intravenous Once  . aspirin  81 mg Oral Daily  . insulin aspart  0-9 Units Subcutaneous 6 times per day  . levothyroxine  88 mcg Oral Once per day on Mon Tue Thu Fri Sat  . multivitamin with minerals  1 tablet Oral Daily  . [START ON 03/06/2015] pantoprazole (PROTONIX) IV  40 mg Intravenous Q12H  . pravastatin  20 mg Oral q1800  . pregabalin  75 mg Oral BID  . sodium chloride  3 mL Intravenous Q12H  . traMADol  50 mg Oral BID   Infusions: . pantoprozole (PROTONIX) infusion 8 mg/hr (03/02/15 2232)   PRN Meds:    Allergies as of 03/02/2015 - Review Complete 03/02/2015  Allergen Reaction Noted  . Other  03/15/2013    Family History  Problem Relation Age of Onset  . Diabetes Father     History   Social History  . Marital Status: Widowed    Spouse Name: N/A  . Number of Children: N/A  . Years of Education: N/A   Occupational History  . Not on file.   Social History Main Topics  . Smoking status: Never Smoker   . Smokeless tobacco: Never Used  . Alcohol Use: Yes     Comment: "I have had some alcohol in my younger days; a long time ago; when I was young"  . Drug Use: No  . Sexual Activity: Not on file   Other Topics Concern  . Not on file   Social History Narrative    REVIEW OF SYSTEMS: Constitutional:  Stable at 140 pounds for many years. Generally no weakness or fatigue. ENT:  No nose bleeds Pulm:  No dyspnea on exertion, no cough. No pleuritic pain. CV:  No palpitations, no LE edema. No chest pain GU:  No hematuria, no frequency. GI:  Per history of present illness. No dysphagia. Heme:  Anemic in her younger years, when menstruating   Transfusions:  Many decades ago.  Circumstances unclear Neuro:  No headaches, no peripheral tingling or numbness Derm:  No itching, no rash or sores.  Endocrine:  No sweats or chills.  No polyuria or dysuria Immunization:  Not queried.  Travel:  None beyond local counties in last few months.    PHYSICAL  EXAM: Vital signs in last 24 hours: Filed Vitals:   03/03/15 0850  BP: 133/50  Pulse: 77  Temp: 98.9 F (37.2 C)  Resp: 16   Wt Readings  from Last 3 Encounters:  03/02/15 142 lb 4.8 oz (64.547 kg)  02/20/15 141 lb (63.957 kg)  12/15/14 140 lb 3.2 oz (63.594 kg)    General: Pleasant, looks wonderful for her advanced age. Comfortable pale but otherwise appears in good health Head:  No facial edema or asymmetry. No signs of trauma.  Eyes:  No scleral icterus, conjunctiva lightly pale. Ears:  No gross hearing deficit  Nose:  Congestion or discharge. Mouth:  Clear, moist, no oral lesions. Neck:  No JVD, no masses, no thyromegaly Lungs:  CTA bil.  Somewhat reduced breath sounds in the right base. Heart: RRR. No MRG. S1/S2 audible. Abdomen:  Soft, nonobese. No masses, no bruits, no organomegaly. Bowel sounds active. No tenderness.   Rectal: Maroon, pastelike stool in vault. No masses. Stool was obviously bloody and smell to of melena.   Musc/Skeltl: Some arthritic changes in the hands. Extremities:  No CCE.  Neurologic:  Oriented 3. No tremor, no gross neurologic deficits or muscle weakness. She is able to sit up in bed by herself without assistance. Skin:  No telangiectasia, no sores or rashes. Significant bruising or purpura. Tattoos:  None   Psych:  Pleasant, engaged, not anxious or depressed.  Intake/Output from previous day: 03/03 0701 - 03/04 0700 In: 338 [I.V.:3; Blood:335] Out: -  Intake/Output this shift: Total I/O In: 240 [P.O.:240] Out: -   LAB RESULTS:  Recent Labs  03/02/15 1030 03/02/15 2157 03/03/15 0208 03/03/15 0237  WBC 10.9*  --   --  8.6  HGB 10.1* 7.1* 6.7* 6.8*  HCT 31.1* 21.5* 20.6* 20.1*  PLT 145*  --   --  111*   BMET Lab Results  Component Value Date   NA 138 03/03/2015   NA 140 03/02/2015   NA 139 02/15/2015   K 4.8 03/03/2015   K 5.4* 03/02/2015   K 5.0 02/15/2015   CL 112 03/03/2015   CL 109 03/02/2015   CL 107 02/15/2015    CO2 21 03/03/2015   CO2 27 03/02/2015   CO2 26 02/15/2015   GLUCOSE 91 03/03/2015   GLUCOSE 238* 03/02/2015   GLUCOSE 156* 02/15/2015   BUN 39* 03/03/2015   BUN 37* 03/02/2015   BUN 35* 02/15/2015   CREATININE 1.37* 03/03/2015   CREATININE 1.41* 03/02/2015   CREATININE 1.41* 02/15/2015   CALCIUM 8.1* 03/03/2015   CALCIUM 8.7 03/02/2015   CALCIUM 9.4 02/15/2015   LFT  Recent Labs  03/02/15 1030  PROT 5.7*  ALBUMIN 3.3*  AST 21  ALT 14  ALKPHOS 63  BILITOT 0.3   PT/INR No results found for: INR, PROTIME Hepatitis Panel No results for input(s): HEPBSAG, HCVAB, HEPAIGM, HEPBIGM in the last 72 hours. C-Diff No components found for: CDIFF Lipase     Component Value Date/Time   LIPASE 16 03/15/2013 1318    Drugs of Abuse  No results found for: LABOPIA, COCAINSCRNUR, LABBENZ, AMPHETMU, THCU, LABBARB   RADIOLOGY STUDIES: Dg Chest 2 View  03/02/2015   CLINICAL DATA:  Dizziness and hypothyroidism  EXAM: CHEST  2 VIEW  COMPARISON:  March 15, 2013  FINDINGS: There are scattered calcified granulomas. There is scarring in the right lower lobe region as well as in each apical region. There is no edema or consolidation. The heart size and pulmonary vascularity are normal. There is a small hiatal hernia. No adenopathy. There is degenerative change at the thoracolumbar junction with lower thoracic levoscoliosis.  IMPRESSION: Areas of scarring, stable. Scattered calcified granulomas. No edema  or consolidation. Small hiatal hernia.   Electronically Signed   By: Lowella Grip III M.D.   On: 03/02/2015 12:29   Ct Head Wo Contrast  03/02/2015   CLINICAL DATA:  81 year old female with dizziness.  EXAM: CT HEAD WITHOUT CONTRAST  TECHNIQUE: Contiguous axial images were obtained from the base of the skull through the vertex without intravenous contrast.  COMPARISON:  No priors.  FINDINGS: Well-defined focus of low attenuation in the posterior aspect of the head of the right caudate nucleus  extending into the overlying white matter, compatible with an old lacunar infarct. Patchy and confluent areas of decreased attenuation are noted throughout the deep and periventricular white matter of the cerebral hemispheres bilaterally, compatible with chronic microvascular ischemic disease. Mild cerebral atrophy, age appropriate. Numerous atherosclerotic calcifications in the visualize cerebral vasculature. No acute intracranial abnormalities. Specifically, no evidence of acute intracranial hemorrhage, no definite findings of acute/subacute cerebral ischemia, no mass, mass effect, hydrocephalus or abnormal intra or extra-axial fluid collections. Visualized paranasal sinuses and mastoids are well pneumatized, with exception of a small left mastoid effusion. No acute displaced skull fractures are identified.  IMPRESSION: 1. No acute intracranial abnormalities. 2. Small left mastoid effusion. 3. Mild cerebral atrophy with chronic microvascular ischemic changes in the cerebral white matter and old right-sided lacunar infarct, as above. 4. Atherosclerosis.   Electronically Signed   By: Vinnie Langton M.D.   On: 03/02/2015 12:13    ENDOSCOPIC STUDIES: None found but pt recalls remote colonoscopy but not any GI pathology.   IMPRESSION:   *  FOBT positive with normocytic anemia.  Symptomatic. On rectal exam has melenic looking stool.  Had not had this or bleeding PR at home, only minor blood with wiping post BM about 4 to 6 weeks ago. R/o ulcer bleed, r/o angioectasia with bleeding, r/o neoplasi    PLAN:     *  Per Dr Deatra Ina.  EGD this afternoon.   Azucena Freed  03/03/2015, 10:32 AM Pager: [phone]  GI Attending Note   Chart was reviewed and patient was examined. X-rays and lab were reviewed.   Subacute GI bleed most likely from an upper GI source.  Patient does give a history of intermittent dysphagia.  Bleeding from active peptic disease, AVMs and neoplasm should be ruled out.  Colonic  bleeding from AVMs and neoplasm are also considerations.  Plan to proceed with upper endoscopy as first exam.   Sandy Salaam. Deatra Ina, M.D., [facility] Cell [phone]

## 2015-03-04 DIAGNOSIS — I6789 Other cerebrovascular disease: Secondary | ICD-10-CM

## 2015-03-04 LAB — GLUCOSE, CAPILLARY
GLUCOSE-CAPILLARY: 159 mg/dL — AB (ref 70–99)
GLUCOSE-CAPILLARY: 178 mg/dL — AB (ref 70–99)
Glucose-Capillary: 105 mg/dL — ABNORMAL HIGH (ref 70–99)
Glucose-Capillary: 161 mg/dL — ABNORMAL HIGH (ref 70–99)
Glucose-Capillary: 178 mg/dL — ABNORMAL HIGH (ref 70–99)
Glucose-Capillary: 75 mg/dL (ref 70–99)

## 2015-03-04 LAB — LIPID PANEL
CHOL/HDL RATIO: 2.8 ratio
CHOLESTEROL: 110 mg/dL (ref 0–200)
HDL: 40 mg/dL (ref >39)
LDL Cholesterol: 53 mg/dL (ref 0–99)
TRIGLYCERIDES: 86 mg/dL (ref <150)
VLDL: 17 mg/dL (ref 0–40)

## 2015-03-04 LAB — HEMOGLOBIN AND HEMATOCRIT, BLOOD
HCT: 25.2 % — ABNORMAL LOW (ref 36.0–46.0)
Hemoglobin: 8.3 g/dL — ABNORMAL LOW (ref 12.0–15.0)

## 2015-03-04 NOTE — Progress Notes (Signed)
*  PRELIMINARY RESULTS* Vascular Ultrasound Carotid Duplex (Doppler) has been completed.  Preliminary findings:  Bilateral:  1-39% ICA stenosis.  Vertebral artery flow is antegrade.      Landry Mellow, RDMS, RVT  03/04/2015, 11:54 AM

## 2015-03-04 NOTE — Progress Notes (Addendum)
[facility] PROGRESS NOTE  Prerna Harold Quist STM:[id number] DOB: 11-18-1915 DOA: 03/02/2015 PCP: Elayne Snare, MD  Assessment/Plan:  Principal Problem:   Acute gastric ulcer with bleeding/visible vessel, s/p clips and bx: appreciate GI. Serial h/h. Continue protonix gtt Active Problems:   Acute ischemic stroke: MRI shows ACUTE right cerebellar CVA, which could explain vertigo. Symptoms improved. Continue tele. Check echo, carotid doppers, lipids, hgb a1c. No antiplatelets due to GIB.  Elderly, but lives alone and very independent.  Neuro checks. Pt/ot/st eval   Hypothyroidism   DM type 2 (diabetes mellitus, type 2)   Diabetic neuropathy   Chronic kidney disease, stage III (moderate)   Vertigo, syncope, multifactorial: due to GIB AND acute cerebellar stroke. Symptoms improved   Acute blood loss anemia: s/p 2 units pRBC   DM (diabetes mellitus), type 2 with renal complications: SSI   Code Status:  full Family Communication:  At bedside Disposition Plan:  Home? When stable and w/u complete  Consultants:  GI  Procedures:   EGD, bx, clip  Antibiotics:    HPI/Subjective: Feels better. No vertigo, no n/v. No further stools  Objective: Filed Vitals:   03/04/15 0424  BP: 134/35  Pulse: 73  Temp: 98.3 F (36.8 C)  Resp: 19    Intake/Output Summary (Last 24 hours) at 03/04/15 1038 Last data filed at 03/04/15 1013  Gross per 24 hour  Intake   1146 ml  Output    300 ml  Net    846 ml   Filed Weights   03/02/15 1020 03/02/15 2030 03/03/15 2100  Weight: 63.957 kg (141 lb) 64.547 kg (142 lb 4.8 oz) 65.953 kg (145 lb 6.4 oz)    Exam:   General:  A, o, talkative  HEENT: no nystagmus. MMM  Cardiovascular: RRR without MGR  Respiratory: CTA without WRR  Abdomen: S, NT, ND  Ext: no CCE  Neuro: CN intact, motor 5/5  Basic Metabolic Panel:  Recent Labs Lab 03/02/15 1030 03/03/15 0237  NA 140 138  K 5.4* 4.8  CL 109 112  CO2 27 21  GLUCOSE 238* 91   BUN 37* 39*  CREATININE 1.41* 1.37*  CALCIUM 8.7 8.1*   Liver Function Tests:  Recent Labs Lab 03/02/15 1030  AST 21  ALT 14  ALKPHOS 63  BILITOT 0.3  PROT 5.7*  ALBUMIN 3.3*   No results for input(s): LIPASE, AMYLASE in the last 168 hours. No results for input(s): AMMONIA in the last 168 hours. CBC:  Recent Labs Lab 03/02/15 1030 03/02/15 2157 03/03/15 0208 03/03/15 0237 03/03/15 1050 03/04/15 0454  WBC 10.9*  --   --  8.6  --   --   HGB 10.1* 7.1* 6.7* 6.8* 8.7* 8.3*  HCT 31.1* 21.5* 20.6* 20.1* 25.8* 25.2*  MCV 95.1  --   --  92.2  --   --   PLT 145*  --   --  111*  --   --    Cardiac Enzymes:  Recent Labs Lab 03/02/15 1837 03/02/15 2359 03/03/15 0237  TROPONINI <0.03 <0.03 0.03   BNP (last 3 results) No results for input(s): BNP in the last 8760 hours.  ProBNP (last 3 results) No results for input(s): PROBNP in the last 8760 hours.  CBG:  Recent Labs Lab 03/03/15 1745 03/03/15 2059 03/04/15 0037 03/04/15 0424 03/04/15 0759  GLUCAP 146* 159* 75 105* 159*    No results found for this or any previous visit (from the past 240 hour(s)).   Studies: Dg Chest  2 View  03/02/2015   CLINICAL DATA:  Dizziness and hypothyroidism  EXAM: CHEST  2 VIEW  COMPARISON:  March 15, 2013  FINDINGS: There are scattered calcified granulomas. There is scarring in the right lower lobe region as well as in each apical region. There is no edema or consolidation. The heart size and pulmonary vascularity are normal. There is a small hiatal hernia. No adenopathy. There is degenerative change at the thoracolumbar junction with lower thoracic levoscoliosis.  IMPRESSION: Areas of scarring, stable. Scattered calcified granulomas. No edema or consolidation. Small hiatal hernia.   Electronically Signed   By: Lowella Grip III M.D.   On: 03/02/2015 12:29   Ct Head Wo Contrast  03/02/2015   CLINICAL DATA:  81 year old female with dizziness.  EXAM: CT HEAD WITHOUT CONTRAST   TECHNIQUE: Contiguous axial images were obtained from the base of the skull through the vertex without intravenous contrast.  COMPARISON:  No priors.  FINDINGS: Well-defined focus of low attenuation in the posterior aspect of the head of the right caudate nucleus extending into the overlying white matter, compatible with an old lacunar infarct. Patchy and confluent areas of decreased attenuation are noted throughout the deep and periventricular white matter of the cerebral hemispheres bilaterally, compatible with chronic microvascular ischemic disease. Mild cerebral atrophy, age appropriate. Numerous atherosclerotic calcifications in the visualize cerebral vasculature. No acute intracranial abnormalities. Specifically, no evidence of acute intracranial hemorrhage, no definite findings of acute/subacute cerebral ischemia, no mass, mass effect, hydrocephalus or abnormal intra or extra-axial fluid collections. Visualized paranasal sinuses and mastoids are well pneumatized, with exception of a small left mastoid effusion. No acute displaced skull fractures are identified.  IMPRESSION: 1. No acute intracranial abnormalities. 2. Small left mastoid effusion. 3. Mild cerebral atrophy with chronic microvascular ischemic changes in the cerebral white matter and old right-sided lacunar infarct, as above. 4. Atherosclerosis.   Electronically Signed   By: Vinnie Langton M.D.   On: 03/02/2015 12:13   Mr Brain Wo Contrast  03/03/2015   CLINICAL DATA:  Sudden onset of dizziness. Frontal headache. Vertigo.  EXAM: MRI HEAD WITHOUT CONTRAST  TECHNIQUE: Multiplanar, multiecho pulse sequences of the brain and surrounding structures were obtained without intravenous contrast.  COMPARISON:  CT head without contrast from the same day.  FINDINGS: An acute nonhemorrhagic infarct is present within the superior right cerebellum measuring 10 x 18 mm. This infarct. Remote on the CT scan. T2 and FLAIR hyperintensity is associated.  Moderate  atrophy and periventricular white matter changes are noted otherwise. There is a remote lacunar infarct within the right centrum semi ovale and another and the more anterior right coronal radiata. No significant extraaxial fluid collection is present.  Flow is present in the major intracranial arteries. The patient is status post bilateral lens replacements. The paranasal sinuses and scratch the the paranasal sinuses are clear. There is some fluid in the mastoid air cells bilaterally. No obstructing nasopharyngeal lesion is evident.  IMPRESSION: 1. Acute nonhemorrhagic infarct within the right superior cerebellum corresponds with the patient's acute vertigo and dizziness. 2. Moderate atrophy and white matter changes otherwise likely reflect expected changes of chronic microvascular ischemia.   Electronically Signed   By: San Morelle M.D.   On: 03/03/2015 10:35    Scheduled Meds: . sodium chloride   Intravenous Once  . insulin aspart  0-9 Units Subcutaneous 6 times per day  . levothyroxine  88 mcg Oral Once per day on Mon Tue Thu Fri Sat  . [START  ON 03/06/2015] pantoprazole (PROTONIX) IV  40 mg Intravenous Q12H  . pravastatin  20 mg Oral q1800  . pregabalin  75 mg Oral BID  . sodium chloride  3 mL Intravenous Q12H   Continuous Infusions: . pantoprozole (PROTONIX) infusion 8 mg/hr (03/02/15 2232)    Time spent: 25 minutes  [facility] [email or web address], password [facility] 03/04/2015, 10:38 AM  LOS: 1 day

## 2015-03-04 NOTE — Evaluation (Signed)
Occupational Therapy Evaluation Patient Details Name: [name] MRN: [id number] DOB: 09-22-1915 Today's Date: 03/04/2015    History of Present Illness Who lives home alone and gets around well with her walker. She presents to the ER with family after waking up with dizziness this AM when she was going to the bathroom. Dizziness is NOT just when she stands up.. MRI: Acute nonhemorrhagic infarct within the right superior cerebellum   Clinical Impression   This 81 yo female who lives completely independently including driving, cooking, laundry, and BADLs was admitted with above and presents to acute OT with dizziness with movement, decreased balance and decreased mobility all affecting her PLOF of Independence. She will benefit from acute OT with follow up OT on CIR.    Follow Up Recommendations  CIR    Equipment Recommendations   (TBD at next venue)       Precautions / Restrictions Precautions Precautions: Fall Restrictions Weight Bearing Restrictions: No      Mobility Bed Mobility Overal bed mobility: Needs Assistance Bed Mobility: Supine to Sit     Supine to sit: Supervision        Transfers Overall transfer level: Needs assistance Equipment used: Rolling walker (2 wheeled) Transfers: Sit to/from Stand Sit to Stand: Min assist         General transfer comment: A couple of times pt halted herself abruptly throwing her balance forward--pt says it was the gripper socks causing this.    Balance Overall balance assessment: Needs assistance Sitting-balance support: No upper extremity supported;Feet supported Sitting balance-Leahy Scale: Fair     Standing balance support: Bilateral upper extremity supported Standing balance-Leahy Scale: Fair                              ADL Overall ADL's : Needs assistance/impaired Eating/Feeding: Independent;Sitting   Grooming: Set up;Sitting   Upper Body Bathing: Set up;Sitting   Lower Body Bathing:  Minimal assistance;Sit to/from stand   Upper Body Dressing : Set up;Sitting   Lower Body Dressing: Minimal assistance;Sit to/from stand   Toilet Transfer: Minimal assistance;Ambulation;RW (bed>door>recliner )   Toileting- Clothing Manipulation and Hygiene: Minimal assistance;Sit to/from stand         General ADL Comments: Had pt focus on an object in front of her for transitional movements so that there was not a lot of movement of her head and eyes together which triggers the dizzness               Pertinent Vitals/Pain Pain Assessment: No/denies pain     Hand Dominance Right   Extremity/Trunk Assessment Upper Extremity Assessment Upper Extremity Assessment: Overall WFL for tasks assessed           Communication Communication Communication: No difficulties   Cognition Arousal/Alertness: Awake/alert Behavior During Therapy: WFL for tasks assessed/performed Overall Cognitive Status: History of cognitive impairments - at baseline                                Home Living Family/patient expects to be discharged to:: Private residence Living Arrangements: Alone Available Help at Discharge: Family;Available PRN/intermittently Type of Home: House                       Home Equipment: Walker - 2 wheels   Additional Comments: Pt has medic alert necklace ( I gave it to dtr-in-law to take  home)      Prior Functioning/Environment Level of Independence: Independent with assistive device(s)        Comments: Drives, cooks, does dishes, and does laundry (hired help for deep cleaning and changing out of sheets)    OT Diagnosis: Generalized weakness (vestibular)   OT Problem List: Impaired balance (sitting and/or standing)   OT Treatment/Interventions: Self-care/ADL training;DME and/or AE instruction;Balance training;Patient/family education    OT Goals(Current goals can be found in the care plan section) Acute Rehab OT Goals Patient Stated  Goal: to be able to get back home after rehab OT Goal Formulation: With patient/family Time For Goal Achievement: 03/11/15 Potential to Achieve Goals: Good  OT Frequency: Min 3X/week   Barriers to D/C: Decreased caregiver support             End of Session Equipment Utilized During Treatment: Gait belt;Rolling walker Nurse Communication: Mobility status (and NT as well--use BSC not bed pan and not walking to bathroom)  Activity Tolerance:  (limited by intermittent dizziness) Patient left: in chair;with call bell/phone within reach;with family/visitor present   Time: 0962-8366 OT Time Calculation (min): 48 min Charges:  OT General Charges $OT Visit: 1 Procedure OT Evaluation $Initial OT Evaluation Tier I: 1 Procedure OT Treatments $Self Care/Home Management : 23-37 mins  Almon Register [phone] 03/04/2015, 4:12 PM

## 2015-03-04 NOTE — Progress Notes (Signed)
  Echocardiogram 2D Echocardiogram has been performed.  [name] 03/04/2015, 1:54 PM

## 2015-03-04 NOTE — Progress Notes (Signed)
PT Cancellation Note  Patient Details Name: [name] MRN: [id number] DOB: 09/09/1915   Cancelled Treatment:    Reason Eval/Treat Not Completed: Medical issues which prohibited therapy   Ramond Dial 03/04/2015, 1:18 PM   Mee Hives, PT MS Acute Rehab Dept. Number: [phone]

## 2015-03-04 NOTE — Evaluation (Signed)
Speech Language Pathology Evaluation Patient Details Name: [name] MRN: [id number] DOB: 30-Aug-1915 Today's Date: 03/04/2015 Time: 1010-1027 SLP Time Calculation (min) (ACUTE ONLY): 17 min  Problem List:  Patient Active Problem List   Diagnosis Date Noted  . Acute blood loss anemia 03/03/2015  . Acute gastric ulcer with bleeding 03/03/2015  . Acute ischemic stroke 03/03/2015  . Syncope 03/03/2015  . DM (diabetes mellitus), type 2 with renal complications 28/63/8177  . Dizzy 03/02/2015  . OAB (overactive bladder) 08/11/2013  . Edema 08/11/2013  . Chronic kidney disease, stage III (moderate) 08/11/2013  . Hypothyroidism 03/15/2013  . DM type 2 (diabetes mellitus, type 2) 03/15/2013  . Diabetic neuropathy 03/15/2013   Past Medical History:  Past Medical History  Diagnosis Date  . Hypothyroidism   . Type II diabetes mellitus   . Tuberculosis 1938    During the Great Depression  . Migraines     "a long long time ago"  . Arthritis     "a little; in my joints" (03/02/2015)   Past Surgical History:  Past Surgical History  Procedure Laterality Date  . Hip fracture surgery Bilateral   . Fracture surgery    . Tonsillectomy    . Cataract extraction w/ intraocular lens  implant, bilateral Bilateral    HPI:  [name] is a 81 y.o. female admitted for GI bleed and dizziness with acute right cerebellar infarct on MRI.   Assessment / Plan / Recommendation Clinical Impression  Pt appears to be at her baseline level of function, which includes decreased working memory and retrieval of new information. Pt and granddaughter present agree that she is at her baseline. No acute SLP needs identified at this time.     SLP Assessment  Patient does not need any further Speech Lanaguage Pathology Services    Follow Up Recommendations  None    Pertinent Vitals/Pain Pain Assessment: No/denies pain   SLP Goals  Patient/Family Stated Goal: none stated  SLP Evaluation Prior  Functioning  Cognitive/Linguistic Baseline: Baseline deficits Baseline deficit details: pt reports memory difficulties over the past year Type of Home: House  Lives With: Alone   Cognition  Overall Cognitive Status: History of cognitive impairments - at baseline (decreased working Programmer, multimedia of new information)    Comprehension  Auditory Comprehension Overall Auditory Comprehension: Appears within functional limits for tasks assessed Visual Recognition/Discrimination Discrimination: Within Function Limits Reading Comprehension Reading Status: Within funtional limits    Expression Expression Primary Mode of Expression: Verbal Verbal Expression Overall Verbal Expression: Appears within functional limits for tasks assessed Written Expression Written Expression: Not tested   Oral / Motor Oral Motor/Sensory Function Overall Oral Motor/Sensory Function: Appears within functional limits for tasks assessed Motor Speech Overall Motor Speech: Appears within functional limits for tasks assessed    Germain Osgood, M.A. CCC-SLP [phone]  Germain Osgood 03/04/2015, 10:45 AM

## 2015-03-04 NOTE — Evaluation (Signed)
Physical Therapy Evaluation Patient Details Name: [name] MRN: [id number] DOB: 05/16/1915 Today's Date: 03/04/2015   History of Present Illness  Who lives home alone and gets around well with her walker. She presents to the ER with family after waking up with dizziness this AM when she was going to the bathroom. Dizziness is NOT just when she stands up.. MRI: Acute nonhemorrhagic infarct within the right superior cerebellum  Clinical Impression  .Pt admitted with/for dizziness/verigo and found to be due to R cerebellar infarct..  Pt currently limited functionally due to the problems listed. ( See problems list.)   Pt will benefit from PT to maximize function and safety in order to get ready for next venue listed below.     Follow Up Recommendations CIR    Equipment Recommendations  None recommended by PT    Recommendations for Other Services Rehab consult     Precautions / Restrictions Precautions Precautions: Fall Restrictions Weight Bearing Restrictions: No      Mobility  Bed Mobility Overal bed mobility: Needs Assistance Bed Mobility: Supine to Sit     Supine to sit: Supervision     General bed mobility comments: in chair  Transfers Overall transfer level: Needs assistance Equipment used: 1 person hand held assist Transfers: Sit to/from Stand Sit to Stand: Min assist         General transfer comment: assist to come forward more than assist power up.  Ambulation/Gait Ambulation/Gait assistance: Mod assist Ambulation Distance (Feet): 20 Feet Assistive device: 1 person hand held assist Gait Pattern/deviations: Step-through pattern;Ataxic;Wide base of support Gait velocity: slow and guarded   General Gait Details: R> L LE staggery and mildly ataxic.  Pt tending to flex posture in guarded attempt to maintain balance  Stairs            Wheelchair Mobility    Modified Rankin (Stroke Patients Only) Modified Rankin (Stroke Patients  Only) Pre-Morbid Rankin Score: No symptoms Modified Rankin: Moderately severe disability     Balance Overall balance assessment: Needs assistance Sitting-balance support: No upper extremity supported Sitting balance-Leahy Scale: Fair     Standing balance support: Single extremity supported Standing balance-Leahy Scale: Fair Standing balance comment: statically                             Pertinent Vitals/Pain Pain Assessment: No/denies pain    Home Living Family/patient expects to be discharged to:: Private residence Living Arrangements: Alone Available Help at Discharge: Family;Available PRN/intermittently Type of Home: House         Home Equipment: Gilford Rile - 2 wheels Additional Comments: Pt has medic alert necklace ( I gave it to dtr-in-law to take home)    Prior Function Level of Independence: Independent with assistive device(s)         Comments: Drives, cooks, does dishes, and does laundry (hired help for deep cleaning and changing out of sheets)     Hand Dominance   Dominant Hand: Right    Extremity/Trunk Assessment   Upper Extremity Assessment: Defer to OT evaluation           Lower Extremity Assessment: Overall WFL for tasks assessed;RLE deficits/detail RLE Deficits / Details: mildly weakerf than L and less coordinated of movement.       Communication   Communication: No difficulties  Cognition Arousal/Alertness: Awake/alert Behavior During Therapy: WFL for tasks assessed/performed Overall Cognitive Status: History of cognitive impairments - at baseline  General Comments      Exercises        Assessment/Plan    PT Assessment Patient needs continued PT services  PT Diagnosis Difficulty walking;Generalized weakness   PT Problem List Decreased strength;Decreased activity tolerance;Decreased balance;Decreased mobility;Decreased coordination;Decreased knowledge of use of DME  PT Treatment  Interventions DME instruction;Gait training;Stair training;Functional mobility training;Therapeutic activities;Balance training;Patient/family education;Neuromuscular re-education   PT Goals (Current goals can be found in the Care Plan section) Acute Rehab PT Goals Patient Stated Goal: to be able to get back home after rehab PT Goal Formulation: With patient Time For Goal Achievement: 03/18/15 Potential to Achieve Goals: Good    Frequency Min 3X/week   Barriers to discharge        Co-evaluation               End of Session   Activity Tolerance: Patient tolerated treatment well Patient left: in chair;with call bell/phone within reach Nurse Communication: Mobility status         Time: 1710-1732 PT Time Calculation (min) (ACUTE ONLY): 22 min   Charges:   PT Evaluation $Initial PT Evaluation Tier I: 1 Procedure     PT G Codes:        Kaden Dunkel, Tessie Fass 03/04/2015, 5:44 PM 03/04/2015  Donnella Sham, [place] [phone]  (pager)

## 2015-03-04 NOTE — Progress Notes (Signed)
    Progress Note   Subjective  slept 12 hours, feels great   Objective   Vital signs in last 24 hours: Temp:  [98 F (36.7 C)-98.3 F (36.8 C)] 98.2 F (36.8 C) (03/05 1039) Pulse Rate:  [59-88] 62 (03/05 1039) Resp:  [16-31] 18 (03/05 1039) BP: (120-181)/(30-88) 147/43 mmHg (03/05 1039) SpO2:  [95 %-99 %] 99 % (03/05 1039) Weight:  [145 lb 6.4 oz (65.953 kg)] 145 lb 6.4 oz (65.953 kg) (03/04 2100) Last BM Date: 03/03/15 General:    white female in NAD Heart:  Occasional irregular beat Abdomen:  Soft, nontender and nondistended. Normal bowel sounds. Extremities:  Without edema. Neurologic:  Alert and oriented,  grossly normal neurologically. Psych:  Cooperative. Normal mood and affect.  Intake/Output from previous day: 03/04 0701 - 03/05 0700 In: 1358 [P.O.:720; I.V.:303; Blood:335] Out: 300 [Urine:300] Intake/Output this shift: Total I/O In: 363 [P.O.:360; I.V.:3] Out: -   Lab Results:  Recent Labs  03/02/15 1030  03/03/15 0237 03/03/15 1050 03/04/15 0454  WBC 10.9*  --  8.6  --   --   HGB 10.1*  < > 6.8* 8.7* 8.3*  HCT 31.1*  < > 20.1* 25.8* 25.2*  PLT 145*  --  111*  --   --   < > = values in this interval not displayed. BMET  Recent Labs  03/02/15 1030 03/03/15 0237  NA 140 138  K 5.4* 4.8  CL 109 112  CO2 27 21  GLUCOSE 238* 91  BUN 37* 39*  CREATININE 1.41* 1.37*  CALCIUM 8.7 8.1*   LFT  Recent Labs  03/02/15 1030  PROT 5.7*  ALBUMIN 3.3*  AST 21  ALT 14  ALKPHOS 63  BILITOT 0.3      Assessment / Plan:   24. 81 year old female with upper GI bleed. EGD revealed abnormal gastric fold just beyond GE junction with ulceration and bleeding, s/p endoclip placement.  Will need a follow up EGD with biopsies, She is on day 2 of PPI drip, can change to PO tomorrow. Hospitalist has already advanced diet.      2. Anemia of acute blood loss, s/p one unit of blood yesterday morning. Hgb up from 6.8 to 8.7 post transfusion. Today hgb remains  stable at 8.3.     LOS: 1 day   Tye Savoy  03/04/2015, 11:01 AM   GI Attending Note  I have personally taken an interval history, reviewed the chart, and examined the patient.  I agree with the extender's note, impression and recommendations. Tentatively plan EGD on 3/7  Manroop Jakubowicz D. Deatra Ina, MD, [facility] [phone]

## 2015-03-04 NOTE — Evaluation (Signed)
Clinical/Bedside Swallow Evaluation Patient Details  Name: [name] MRN: [id number] Date of Birth: 1915/01/16  Today's Date: 03/04/2015 Time: SLP Start Time (ACUTE ONLY): 8403 SLP Stop Time (ACUTE ONLY): 1010 SLP Time Calculation (min) (ACUTE ONLY): 19 min  Past Medical History:  Past Medical History  Diagnosis Date  . Hypothyroidism   . Type II diabetes mellitus   . Tuberculosis 1938    During the Great Depression  . Migraines     "a long long time ago"  . Arthritis     "a little; in my joints" (03/02/2015)   Past Surgical History:  Past Surgical History  Procedure Laterality Date  . Hip fracture surgery Bilateral   . Fracture surgery    . Tonsillectomy    . Cataract extraction w/ intraocular lens  implant, bilateral Bilateral    HPI:  [name] is a 81 y.o. female admitted for GI bleed and dizziness with acute right cerebellar infarct on MRI.   Assessment / Plan / Recommendation Clinical Impression  Pt's oropharyngeal swallow appears WFL for age, with no overt s/s of aspiration observed across consistencies. Will defer diet advancement to medical team as appropriate from a GI standpoint, however would advance to a regualr diet and thin liquids as able.    Aspiration Risk  Mild    Diet Recommendation Regular;Thin liquid (as able to advance medically)   Liquid Administration via: Cup;Straw Medication Administration: Whole meds with liquid Supervision: Patient able to self feed;Intermittent supervision to cue for compensatory strategies Compensations: Slow rate;Small sips/bites Postural Changes and/or Swallow Maneuvers: Seated upright 90 degrees    Other  Recommendations Oral Care Recommendations: Oral care BID   Follow Up Recommendations  None    Frequency and Duration        Pertinent Vitals/Pain n/a    SLP Swallow Goals     Swallow Study Prior Functional Status       General HPI: [name] is a 81 y.o. female admitted for GI bleed and  dizziness with acute right cerebellar infarct on MRI. Type of Study: Bedside swallow evaluation Previous Swallow Assessment: none in chart Diet Prior to this Study: Thin liquids (clear liquids) Temperature Spikes Noted: No Respiratory Status: Room air History of Recent Intubation: No Behavior/Cognition: Alert;Cooperative;Pleasant mood Oral Cavity - Dentition: Adequate natural dentition Self-Feeding Abilities: Able to feed self Patient Positioning: Upright in bed Baseline Vocal Quality: Clear    Oral/Motor/Sensory Function Overall Oral Motor/Sensory Function: Appears within functional limits for tasks assessed   Ice Chips Ice chips: Not tested   Thin Liquid Thin Liquid: Within functional limits Presentation: Cup;Self Fed;Straw    Nectar Thick Nectar Thick Liquid: Not tested   Honey Thick Honey Thick Liquid: Not tested   Puree Puree: Within functional limits Presentation: Self Fed;Spoon   Solid    Solid: Within functional limits Presentation: Self Fed      Germain Osgood, M.A. CCC-SLP [phone]  Germain Osgood 03/04/2015,10:40 AM

## 2015-03-05 DIAGNOSIS — E1129 Type 2 diabetes mellitus with other diabetic kidney complication: Secondary | ICD-10-CM

## 2015-03-05 LAB — BASIC METABOLIC PANEL
Anion gap: 7 (ref 5–15)
BUN: 34 mg/dL — AB (ref 6–23)
CO2: 22 mmol/L (ref 19–32)
CREATININE: 1.45 mg/dL — AB (ref 0.50–1.10)
Calcium: 8.6 mg/dL (ref 8.4–10.5)
Chloride: 113 mmol/L — ABNORMAL HIGH (ref 96–112)
GFR, EST AFRICAN AMERICAN: 34 mL/min — AB (ref >90)
GFR, EST NON AFRICAN AMERICAN: 29 mL/min — AB (ref >90)
Glucose, Bld: 171 mg/dL — ABNORMAL HIGH (ref 70–99)
POTASSIUM: 3.9 mmol/L (ref 3.5–5.1)
SODIUM: 142 mmol/L (ref 135–145)

## 2015-03-05 LAB — CBC
HCT: 25.6 % — ABNORMAL LOW (ref 36.0–46.0)
HEMOGLOBIN: 8.5 g/dL — AB (ref 12.0–15.0)
MCH: 30.2 pg (ref 26.0–34.0)
MCHC: 33.2 g/dL (ref 30.0–36.0)
MCV: 91.1 fL (ref 78.0–100.0)
PLATELETS: 125 10*3/uL — AB (ref 150–400)
RBC: 2.81 MIL/uL — AB (ref 3.87–5.11)
RDW: 14.7 % (ref 11.5–15.5)
WBC: 8.3 10*3/uL (ref 4.0–10.5)

## 2015-03-05 LAB — GLUCOSE, CAPILLARY
GLUCOSE-CAPILLARY: 204 mg/dL — AB (ref 70–99)
Glucose-Capillary: 146 mg/dL — ABNORMAL HIGH (ref 70–99)
Glucose-Capillary: 147 mg/dL — ABNORMAL HIGH (ref 70–99)
Glucose-Capillary: 164 mg/dL — ABNORMAL HIGH (ref 70–99)
Glucose-Capillary: 188 mg/dL — ABNORMAL HIGH (ref 70–99)

## 2015-03-05 NOTE — Progress Notes (Addendum)
[facility] PROGRESS NOTE  Jilda Kress Dudenhoeffer CHY:[id number] DOB: 1915/07/30 DOA: 03/02/2015 PCP: Elayne Snare, MD  Assessment/Plan:  Principal Problem:   Acute gastric ulcer on abnormal gastric fold with bleeding, s/p clips: h/h stable s -Will need repeat endo as outpt to ensure healing and possible bx of area. Advance to soft diet. Change ppi to po after 72 hours drip    Acute ischemic stroke: MRI shows ACUTE right cerebellar CVA, which could explain vertigo. Symptoms improved. Continue tele. Check echo, carotid doppers, hgb a1c. LDL 53.  No antiplatelets due to GIB.  Elderly, but lives alone and very independent.  Neuro checks. Pt/ot eval pending. No swallowing/speech or cognition issues.    Hypothyroidism   DM type 2 (diabetes mellitus, type 2) stable on SSI   Diabetic neuropathy   Chronic kidney disease, stage III (moderate)   Vertigo, syncope, multifactorial: due to GIB AND acute cerebellar stroke. Symptoms improved   Acute blood loss anemia: s/p 2 units pRBC   DM (diabetes mellitus), type 2 with renal complications: SSI   Code Status:  full Family Communication:  Called son Disposition Plan:  CIR?  Consultants:  GI  Procedures:   EGD, bx, clip  Antibiotics:    HPI/Subjective: Did not sleep well last PM- had to use the bathroom several times  Objective: Filed Vitals:   03/05/15 0417  BP: 131/33  Pulse: 81  Temp: 98.8 F (37.1 C)  Resp: 16    Intake/Output Summary (Last 24 hours) at 03/05/15 0903 Last data filed at 03/05/15 0425  Gross per 24 hour  Intake    483 ml  Output   1180 ml  Net   -697 ml   Filed Weights   03/02/15 2030 03/03/15 2100 03/04/15 1957  Weight: 64.547 kg (142 lb 4.8 oz) 65.953 kg (145 lb 6.4 oz) 66.316 kg (146 lb 3.2 oz)    Exam:   General:  A, o, talkative  Cardiovascular: RRR without MGR  Respiratory: CTA without WRR  Abdomen: S, NT, ND  Ext: no CCE Neuro: CN intact, motor 5/5, finger to nose normal  Basic  Metabolic Panel:  Recent Labs Lab 03/02/15 1030 03/03/15 0237 03/05/15 0601  NA 140 138 142  K 5.4* 4.8 3.9  CL 109 112 113*  CO2 27 21 22   GLUCOSE 238* 91 171*  BUN 37* 39* 34*  CREATININE 1.41* 1.37* 1.45*  CALCIUM 8.7 8.1* 8.6   Liver Function Tests:  Recent Labs Lab 03/02/15 1030  AST 21  ALT 14  ALKPHOS 63  BILITOT 0.3  PROT 5.7*  ALBUMIN 3.3*   No results for input(s): LIPASE, AMYLASE in the last 168 hours. No results for input(s): AMMONIA in the last 168 hours. CBC:  Recent Labs Lab 03/02/15 1030  03/03/15 0208 03/03/15 0237 03/03/15 1050 03/04/15 0454 03/05/15 0601  WBC 10.9*  --   --  8.6  --   --  8.3  HGB 10.1*  < > 6.7* 6.8* 8.7* 8.3* 8.5*  HCT 31.1*  < > 20.6* 20.1* 25.8* 25.2* 25.6*  MCV 95.1  --   --  92.2  --   --  91.1  PLT 145*  --   --  111*  --   --  125*  < > = values in this interval not displayed. Cardiac Enzymes:  Recent Labs Lab 03/02/15 1837 03/02/15 2359 03/03/15 0237  TROPONINI <0.03 <0.03 0.03   BNP (last 3 results) No results for input(s): BNP in the last 8760 hours.  ProBNP (last 3 results) No results for input(s): PROBNP in the last 8760 hours. Lipid Panel     Component Value Date/Time   CHOL 110 03/04/2015 0454   TRIG 86 03/04/2015 0454   HDL 40 03/04/2015 0454   CHOLHDL 2.8 03/04/2015 0454   VLDL 17 03/04/2015 0454   LDLCALC 53 03/04/2015 0454    CBG:  Recent Labs Lab 03/04/15 1623 03/04/15 2002 03/05/15 0016 03/05/15 0413 03/05/15 0730  GLUCAP 178* 178* 146* 164* 147*    No results found for this or any previous visit (from the past 240 hour(s)).   Studies: Mr Brain Wo Contrast  03/03/2015   CLINICAL DATA:  Sudden onset of dizziness. Frontal headache. Vertigo.  EXAM: MRI HEAD WITHOUT CONTRAST  TECHNIQUE: Multiplanar, multiecho pulse sequences of the brain and surrounding structures were obtained without intravenous contrast.  COMPARISON:  CT head without contrast from the same day.  FINDINGS: An  acute nonhemorrhagic infarct is present within the superior right cerebellum measuring 10 x 18 mm. This infarct. Remote on the CT scan. T2 and FLAIR hyperintensity is associated.  Moderate atrophy and periventricular white matter changes are noted otherwise. There is a remote lacunar infarct within the right centrum semi ovale and another and the more anterior right coronal radiata. No significant extraaxial fluid collection is present.  Flow is present in the major intracranial arteries. The patient is status post bilateral lens replacements. The paranasal sinuses and scratch the the paranasal sinuses are clear. There is some fluid in the mastoid air cells bilaterally. No obstructing nasopharyngeal lesion is evident.  IMPRESSION: 1. Acute nonhemorrhagic infarct within the right superior cerebellum corresponds with the patient's acute vertigo and dizziness. 2. Moderate atrophy and white matter changes otherwise likely reflect expected changes of chronic microvascular ischemia.   Electronically Signed   By: San Morelle M.D.   On: 03/03/2015 10:35    Scheduled Meds: . insulin aspart  0-9 Units Subcutaneous 6 times per day  . levothyroxine  88 mcg Oral Once per day on Mon Tue Thu Fri Sat  . [START ON 03/06/2015] pantoprazole (PROTONIX) IV  40 mg Intravenous Q12H  . pravastatin  20 mg Oral q1800  . pregabalin  75 mg Oral BID  . sodium chloride  3 mL Intravenous Q12H   Continuous Infusions: . pantoprozole (PROTONIX) infusion 8 mg/hr (03/04/15 1803)    Time spent: 25 minutes  Rosselyn Martha  [facility] [email or web address], password [email or web address] 03/05/2015, 9:03 AM  LOS: 2 days

## 2015-03-05 NOTE — Progress Notes (Signed)
    Progress Note   Subjective  She feels fine, visiting with family.    Objective   Vital signs in last 24 hours: Temp:  [97.3 F (36.3 C)-98.8 F (37.1 C)] 98.8 F (37.1 C) (03/06 0417) Pulse Rate:  [73-81] 81 (03/06 0417) Resp:  [16] 16 (03/06 0417) BP: (131-152)/(33-42) 131/33 mmHg (03/06 0417) SpO2:  [97 %-99 %] 97 % (03/06 0417) Weight:  [146 lb 3.2 oz (66.316 kg)] 146 lb 3.2 oz (66.316 kg) (03/05 1957) Last BM Date: 03/04/15 General:    white female in NAD Abdomen:  Soft, nontender and nondistended. Normal bowel sounds. Neurologic:  Alert and oriented,  grossly normal neurologically. Psych:  Cooperative. Normal mood and affect.  Intake/Output from previous day: 03/05 0701 - 03/06 0700 In: 967 [P.O.:840; I.V.:3] Out: 1180 [Urine:1180] Intake/Output this shift:    Lab Results:  Recent Labs  03/03/15 0237 03/03/15 1050 03/04/15 0454 03/05/15 0601  WBC 8.6  --   --  8.3  HGB 6.8* 8.7* 8.3* 8.5*  HCT 20.1* 25.8* 25.2* 25.6*  PLT 111*  --   --  125*   BMET  Recent Labs  03/03/15 0237 03/05/15 0601  NA 138 142  K 4.8 3.9  CL 112 113*  CO2 21 22  GLUCOSE 91 171*  BUN 39* 34*  CREATININE 1.37* 1.45*  CALCIUM 8.1* 8.6      Assessment / Plan:    22. 81 year old female with upper GI bleed. EGD revealed abnormal gastric fold just beyond GE junction with ulceration and bleeding, s/p endoclip placement. She needs repeat EGD with biopsies, will plan for this to be done tomorrow. Procedure discussed with patient and family. No active bleeding. Will change PPI drip to PO BID. NPO after midnight   2. Anemia of acute blood loss, s/p one unit of blood two days ago. Hgb stable at 8.5.     LOS: 2 days   Tye Savoy  03/05/2015, 10:39 AM   GI Attending Note  I have personally taken an interval history, reviewed the chart, and examined the patient.  I agree with the extender's note, impression and recommendations.  Sandy Salaam. Deatra Ina, MD, [facility] [phone]

## 2015-03-06 ENCOUNTER — Encounter (HOSPITAL_COMMUNITY): Payer: Self-pay | Admitting: Gastroenterology

## 2015-03-06 DIAGNOSIS — I639 Cerebral infarction, unspecified: Secondary | ICD-10-CM

## 2015-03-06 LAB — GLUCOSE, CAPILLARY
GLUCOSE-CAPILLARY: 130 mg/dL — AB (ref 70–99)
GLUCOSE-CAPILLARY: 132 mg/dL — AB (ref 70–99)
GLUCOSE-CAPILLARY: 141 mg/dL — AB (ref 70–99)
GLUCOSE-CAPILLARY: 163 mg/dL — AB (ref 70–99)
Glucose-Capillary: 186 mg/dL — ABNORMAL HIGH (ref 70–99)
Glucose-Capillary: 176 mg/dL — ABNORMAL HIGH (ref 70–99)
Glucose-Capillary: 220 mg/dL — ABNORMAL HIGH (ref 70–99)

## 2015-03-06 LAB — HEMOGLOBIN A1C
HEMOGLOBIN A1C: 7.1 % — AB (ref 4.8–5.6)
MEAN PLASMA GLUCOSE: 157 mg/dL

## 2015-03-06 MED ORDER — PANTOPRAZOLE SODIUM 40 MG PO TBEC
40.0000 mg | DELAYED_RELEASE_TABLET | Freq: Two times a day (BID) | ORAL | Status: DC
  Administered 2015-03-06 – 2015-03-08 (×4): 40 mg via ORAL
  Filled 2015-03-06 (×3): qty 1

## 2015-03-06 NOTE — Care Management Note (Signed)
CARE MANAGEMENT NOTE 03/06/2015  Patient:  [name], [name]   Account Number:  [id number]  Date Initiated:  03/06/2015  Documentation initiated by:  Dorenda Pfannenstiel  Subjective/Objective Assessment:   CM following for progression and d/c planning.     Action/Plan:   Met with pt and family member, plan is to return to home is interested in CIR. Pt was driving herself prior to admission.   Anticipated DC Date:  03/08/2015   Anticipated DC Plan:  IP REHAB FACILITY         Choice offered to / List presented to:             Status of service:   Medicare Important Message given?  YES (If response is "NO", the following Medicare IM given date fields will be blank) Date Medicare IM given:  03/06/2015 Medicare IM given by:  Cira Deyoe Date Additional Medicare IM given:   Additional Medicare IM given by:    Discharge Disposition:    Per UR Regulation:    If discussed at Long Length of Stay Meetings, dates discussed:    Comments:

## 2015-03-06 NOTE — Progress Notes (Signed)
Rehab Admissions Coordinator Note:  Patient was screened by Wilferd Ritson L for appropriateness for an Inpatient Acute Rehab Consult.  At this time, we are recommending Inpatient Rehab consult.  [name] L 03/06/2015, 9:00 AM  I can be reached at [phone].

## 2015-03-06 NOTE — Consult Note (Addendum)
Physical Medicine and Rehabilitation Consult Reason for Consult: Acute right cerebellar CVA/GI bleed Referring Physician: [facility]   HPI: [name] is a 81 y.o. right handed female with history of diabetes mellitus and bilateral hip fractures. Admitted 03/02/2015 with dizziness as well as headache. Patient lives alone independently prior to admission with assistive device. She was found to be orthostatic and received fluid bolus in the ED. Findings of hemoglobin 6.7. She was transfused 1 unit of packed red blood cells. MRI of the brain showed acute nonhemorrhagic infarct within the right superior cerebellum. Echocardiogram with ejection fraction of 06% grade 1 diastolic dysfunction. Carotid Doppler showed no ICA stenosis. Gastroenterology services Dr. Deatra Ina consulted underwent EGD 03/03/2015 with noted bleeding just beyond the GE junction and underwent placement of endoscopic clips. Hemoglobin has been monitored daily. Plan repeat EGD possibly 03/06/2015. No current anticoagulation for CVA prophylaxis secondary to GI bleed. Physical occupational therapy evaluations completed 03/04/2015 with recommendations of physical medicine rehabilitation consult.   Review of Systems  Musculoskeletal: Positive for myalgias and joint pain.  Neurological: Positive for dizziness and headaches.  All other systems reviewed and are negative.  Past Medical History  Diagnosis Date  . Hypothyroidism   . Type II diabetes mellitus   . Tuberculosis 1938    During the Great Depression  . Migraines     "a long long time ago"  . Arthritis     "a little; in my joints" (03/02/2015)   Past Surgical History  Procedure Laterality Date  . Hip fracture surgery Bilateral   . Fracture surgery    . Tonsillectomy    . Cataract extraction w/ intraocular lens  implant, bilateral Bilateral   . Esophagogastroduodenoscopy N/A 03/03/2015    Procedure: ESOPHAGOGASTRODUODENOSCOPY (EGD);  Surgeon: Inda Castle, MD;   Location: [facility];  Service: Endoscopy;  Laterality: N/A;   Family History  Problem Relation Age of Onset  . Diabetes Father    Social History:  reports that she has never smoked. She has never used smokeless tobacco. She reports that she drinks alcohol. She reports that she does not use illicit drugs. Allergies:  Allergies  Allergen Reactions  . Other     Ground red pepper lotion to rub on her knee caused SOB   Medications Prior to Admission  Medication Sig Dispense Refill  . aspirin EC 81 MG tablet Take 81 mg by mouth daily.    . Calcium Carbonate-Vitamin D (CALTRATE 600+D) 600-400 MG-UNIT per tablet Take 1 tablet by mouth daily.    Marland Kitchen glimepiride (AMARYL) 2 MG tablet TAKE 1 TABLET BY MOUTH IN THE MORNING AND 1/2 TABLET AT DINNER 135 tablet 0  . insulin aspart (NOVOLOG FLEXPEN) 100 UNIT/ML FlexPen If blood sugar is over 200, take 5 units and if they go over 300, take 6 units at that meal. 15 mL 2  . levothyroxine (SYNTHROID, LEVOTHROID) 88 MCG tablet Take 1 tablet daily (Patient taking differently: Take 88 mcg by mouth See admin instructions. Take 1 tablet daily except for on Sundays and Wednesdays do not take any) 90 tablet 3  . LYRICA 75 MG capsule TAKE 1 CAPSULE TWICE DAILY 180 capsule 1  . Multiple Vitamin (MULTIVITAMIN WITH MINERALS) TABS Take 1 tablet by mouth daily.    . pravastatin (PRAVACHOL) 20 MG tablet     . traMADol (ULTRAM) 50 MG tablet Take 1 tablet (50 mg total) by mouth 2 (two) times daily. 60 tablet 5  . ACCU-CHEK FASTCLIX LANCETS MISC     .  diclofenac sodium (VOLTAREN) 1 % GEL Apply 2 g topically 4 (four) times daily. (Patient not taking: Reported on 03/02/2015) 100 g 3  . Diclofenac Sodium 3 % GEL Place 15 mg onto the skin 2 (two) times daily. Use small amount on knee (Patient not taking: Reported on 03/02/2015) 50 g 2  . glucose blood (ACCU-CHEK AVIVA PLUS) test strip Use to check blood sugar three times  a day 100 each 3  . Insulin Pen Needle (NOVOFINE) 32G X 6 MM  MISC Use as directed 50 each 3  . Lancets Misc. (ACCU-CHEK FASTCLIX LANCET) KIT USE AS DIRECTED FOR TESTING 1 kit 1  . omeprazole (PRILOSEC) 20 MG capsule Take 1 capsule (20 mg total) by mouth daily. (Patient not taking: Reported on 03/02/2015) 30 capsule 0  . psyllium (METAMUCIL) 58.6 % powder Take 1 packet by mouth as directed.      Home: Home Living Family/patient expects to be discharged to:: Private residence Living Arrangements: Alone Available Help at Discharge: Family, Available PRN/intermittently Type of Home: House Home Equipment: Gilford Rile - 2 wheels Additional Comments: Pt has medic alert necklace ( I gave it to dtr-in-law to take home)  Lives With: Alone  Functional History: Prior Function Level of Independence: Independent with assistive device(s) Comments: Drives, cooks, does dishes, and does laundry (hired help for deep cleaning and changing out of sheets) Functional Status:  Mobility: Bed Mobility Overal bed mobility: Needs Assistance Bed Mobility: Supine to Sit Supine to sit: Supervision General bed mobility comments: in chair Transfers Overall transfer level: Needs assistance Equipment used: 1 person hand held assist Transfers: Sit to/from Stand Sit to Stand: Min assist General transfer comment: assist to come forward more than assist power up. Ambulation/Gait Ambulation/Gait assistance: Mod assist Ambulation Distance (Feet): 20 Feet Assistive device: 1 person hand held assist Gait Pattern/deviations: Step-through pattern, Ataxic, Wide base of support Gait velocity: slow and guarded General Gait Details: R> L LE staggery and mildly ataxic.  Pt tending to flex posture in guarded attempt to maintain balance    ADL: ADL Overall ADL's : Needs assistance/impaired Eating/Feeding: Independent, Sitting Grooming: Set up, Sitting Upper Body Bathing: Set up, Sitting Lower Body Bathing: Minimal assistance, Sit to/from stand Upper Body Dressing : Set up,  Sitting Lower Body Dressing: Minimal assistance, Sit to/from stand Toilet Transfer: Minimal assistance, Ambulation, RW (bed>door>recliner ) Toileting- Clothing Manipulation and Hygiene: Minimal assistance, Sit to/from stand General ADL Comments: Had pt focus on an object in front of her for transitional movements so that there was not a lot of movement of her head and eyes together which triggers the dizzness  Cognition: Cognition Overall Cognitive Status: History of cognitive impairments - at baseline Orientation Level: Oriented X4 Cognition Arousal/Alertness: Awake/alert Behavior During Therapy: WFL for tasks assessed/performed Overall Cognitive Status: History of cognitive impairments - at baseline  Blood pressure 168/56, pulse 58, temperature 98.3 F (36.8 C), temperature source Oral, resp. rate 18, height _0  (1.651 m), weight 66.316 kg (146 lb 3.2 oz), SpO2 100 %. Physical Exam  Vitals reviewed. Constitutional: She is oriented to person, place, and time.  HENT:  Head: Normocephalic.  Eyes: EOM are normal.  Neck: Normal range of motion. Neck supple. No thyromegaly present.  Cardiovascular: Normal rate and regular rhythm.   Respiratory: Effort normal and breath sounds normal. No respiratory distress.  GI: Soft. Bowel sounds are normal. She exhibits no distension.  Neurological: She is alert and oriented to person, place, and time.  Follows commands. Decreased [place] RUE  and LUE. No gross limb ataxia. Past pointing noted. Motor 4/5 UE's. 3/5 LLE prox to 4/5 distally. RLE: 2/5 (hip pain) HF, 3/5 ke and adf/apf. No gross sensory findings.   Skin: Skin is warm and dry.  Psychiatric: She has a normal mood and affect. Her behavior is normal. Thought content normal.    Results for orders placed or performed during the hospital encounter of 03/02/15 (from the past 24 hour(s))  Glucose, capillary     Status: Abnormal   Collection Time: 03/05/15 11:44 AM  Result Value Ref Range    Glucose-Capillary 204 (H) 70 - 99 mg/dL  Glucose, capillary     Status: Abnormal   Collection Time: 03/05/15  5:12 PM  Result Value Ref Range   Glucose-Capillary 188 (H) 70 - 99 mg/dL  Glucose, capillary     Status: Abnormal   Collection Time: 03/06/15 12:45 AM  Result Value Ref Range   Glucose-Capillary 141 (H) 70 - 99 mg/dL  Glucose, capillary     Status: Abnormal   Collection Time: 03/06/15  4:43 AM  Result Value Ref Range   Glucose-Capillary 176 (H) 70 - 99 mg/dL  Glucose, capillary     Status: Abnormal   Collection Time: 03/06/15  7:47 AM  Result Value Ref Range   Glucose-Capillary 132 (H) 70 - 99 mg/dL   No results found.  Assessment/Plan: Diagnosis: right cerebellar infarct 1. Does the need for close, 24 hr/day medical supervision in concert with the patient's rehab needs make it unreasonable for this patient to be served in a less intensive setting? Yes 2. Co-Morbidities requiring supervision/potential complications: dm2, diabetic neuropathy, GIB 3. Due to bladder management, bowel management, safety, skin/wound care, disease management, medication administration, pain management and patient education, does the patient require 24 hr/day rehab nursing? Yes 4. Does the patient require coordinated care of a physician, rehab nurse, PT (1-2 hrs/day, 5 days/week) and OT (1-2 hrs/day, 5 days/week) to address physical and functional deficits in the context of the above medical diagnosis(es)? Yes Addressing deficits in the following areas: balance, endurance, locomotion, strength, transferring, bowel/bladder control, bathing, dressing, feeding, grooming, toileting and psychosocial support 5. Can the patient actively participate in an intensive therapy program of at least 3 hrs of therapy per day at least 5 days per week? Yes 6. The potential for patient to make measurable gains while on inpatient rehab is excellent 7. Anticipated functional outcomes upon discharge from inpatient rehab are  modified independent  with PT, modified independent with OT, n/a with SLP. 8. Estimated rehab length of stay to reach the above functional goals is: 8-12 days 9. Does the patient have adequate social supports and living environment to accommodate these discharge functional goals? Yes 10. Anticipated D/C setting: Home 11. Anticipated post D/C treatments: [place] therapy 12. Overall Rehab/Functional Prognosis: excellent  RECOMMENDATIONS: This patient's condition is appropriate for continued rehabilitative care in the following setting: CIR Patient has agreed to participate in recommended program. Yes Note that insurance prior authorization may be required for reimbursement for recommended care.  Comment: Rehab Admissions Coordinator to follow up. Pt would like [place] to speak with son as well.  Thanks,  Meredith Staggers, MD, Mellody Drown     03/06/2015

## 2015-03-06 NOTE — Progress Notes (Signed)
Occupational Therapy Treatment Patient Details Name: [name] MRN: [id number] DOB: 28-Apr-1915 Today's Date: 03/06/2015    History of present illness Who lives home alone and gets around well with her walker. She presents to the ER with family after waking up with dizziness this AM when she was going to the bathroom. Dizziness is NOT just when she stands up.. MRI: Acute nonhemorrhagic infarct within the right superior cerebellum   OT comments  Pt with decreased dizziness, rated at a 1/10 on 1-10 scale, with all functional tasks during session.  Continues to need min assist for safety with functional transfers and dynamic standing tasks.  Limited endurance noted as well, requiring frequent rest breaks.    Follow Up Recommendations  CIR    Equipment Recommendations  Other (comment) (TBD next venue of care)       Precautions / Restrictions Precautions Precautions: Fall Restrictions Weight Bearing Restrictions: No       Mobility Bed Mobility Overal bed mobility: Needs Assistance Bed Mobility: Supine to Sit;Sit to Supine     Supine to sit: Supervision;HOB elevated Sit to supine: Min guard   General bed mobility comments: Pt transitioned to sidelying and then to sitting with HOB elevated and use of rail.   Transfers Overall transfer level: Needs assistance Equipment used: Rolling walker (2 wheeled) Transfers: Sit to/from [facility] Sit to Stand: Min assist Stand pivot transfers: Min assist            Balance Overall balance assessment: Needs assistance   Sitting balance-Leahy Scale: Fair       Standing balance-Leahy Scale: Fair                     ADL Overall ADL's : Needs assistance/impaired     Grooming: Oral care;Min guard                                 General ADL Comments: Pt transitioned to EOB from supine with report of dizziness at a 1/10.  Performed sit to stand from EOB with min assist and ambulated over  to the sink for grooming tasks with min assist as well.  Pt also performed functional mobility in the hallway for 30 ft or so with the RW, but demonstrates flexed trunk and head.  No significant reports of dizziness during any of the functional tasks performed.                    Cognition   Behavior During Therapy: WFL for tasks assessed/performed Overall Cognitive Status: History of cognitive impairments - at baseline (Pt reports decreased ability to remember things.)                                    Pertinent Vitals/ Pain       Pain Assessment: No/denies pain         Frequency Min 2X/week     Progress Toward Goals  OT Goals(current goals can now be found in the care plan section)  Progress towards OT goals: Progressing toward goals     Plan Discharge plan remains appropriate       End of Session Equipment Utilized During Treatment: Gait belt;Rolling walker   Activity Tolerance Patient limited by fatigue   Patient Left in bed;with call bell/phone within reach;with family/visitor present   Nurse  Communication Mobility status      Time: 3335-4562 OT Time Calculation (min): 32 min  Charges: OT General Charges $OT Visit: 1 Procedure OT Treatments $Self Care/Home Management : 23-37 mins  Zakya Halabi OTR/L 03/06/2015, 4:09 PM

## 2015-03-06 NOTE — Progress Notes (Signed)
Physical Therapy Treatment Patient Details Name: [name] MRN: [id number] DOB: 1915-10-05 Today's Date: 03/06/2015    History of Present Illness Who lives home alone and gets around well with her walker. She presents to the ER with family after waking up with dizziness this AM when she was going to the bathroom. Dizziness is NOT just when she stands up.. MRI: Acute nonhemorrhagic infarct within the right superior cerebellum    PT Comments    Pt progressing towards physical therapy goals. Showing improved independence with mobility and ambulation with use of RW (pt reports she uses at home). Occasional min assist provided for balance and safety but pt is grossly performing at a min guard level. Feel that pt will continue to progress well with HHPT to follow at d/c - will require 24 hour assist at home initially.    Follow Up Recommendations  Home health PT;Supervision/Assistance - 24 hour     Equipment Recommendations  None recommended by PT    Recommendations for Other Services       Precautions / Restrictions Precautions Precautions: Fall Restrictions Weight Bearing Restrictions: No    Mobility  Bed Mobility Overal bed mobility: Needs Assistance Bed Mobility: Supine to Sit     Supine to sit: Supervision     General bed mobility comments: Pt was able to transition to EOB with no physical assistance. Use of bed rails and supervision for safety required.   Transfers Overall transfer level: Needs assistance Equipment used: Rolling walker (2 wheeled) Transfers: Sit to/from [facility] Sit to Stand: Min guard Stand pivot transfers: Min assist       General transfer comment: Pt was able to power-up to full standing position without assistance. Unsteadiness noted with transfer bed>BSC and min assist provided to recover.   Ambulation/Gait Ambulation/Gait assistance: Min guard Ambulation Distance (Feet): 25 Feet Assistive device: Rolling walker (2  wheeled) Gait Pattern/deviations: Step-through pattern;Decreased stride length;Narrow base of support;Trunk flexed Gait velocity: slow and guarded Gait velocity interpretation: Below normal speed for age/gender General Gait Details: Pt was able to ambulate to sink for hand washing and then ambualate to the recliner chair with RW and min guard assist. No LOB noted, and pt negotiated the walker well.    Stairs            Wheelchair Mobility    Modified Rankin (Stroke Patients Only) Modified Rankin (Stroke Patients Only) Pre-Morbid Rankin Score: No symptoms Modified Rankin: Moderately severe disability     Balance Overall balance assessment: Needs assistance Sitting-balance support: Feet supported;No upper extremity supported Sitting balance-Leahy Scale: Good     Standing balance support: No upper extremity supported;During functional activity Standing balance-Leahy Scale: Fair Standing balance comment: Pt was able to stand without UE support to perform peri-care and pull up briefs.                     Cognition Arousal/Alertness: Awake/alert Behavior During Therapy: WFL for tasks assessed/performed Overall Cognitive Status: History of cognitive impairments - at baseline                      Exercises      General Comments        Pertinent Vitals/Pain Pain Assessment: No/denies pain    Home Living                      Prior Function  PT Goals (current goals can now be found in the care plan section) Acute Rehab PT Goals Patient Stated Goal: to be able to get back home after rehab PT Goal Formulation: With patient Time For Goal Achievement: 03/18/15 Potential to Achieve Goals: Good Progress towards PT goals: Progressing toward goals    Frequency  Min 3X/week    PT Plan Current plan remains appropriate    Co-evaluation             End of Session Equipment Utilized During Treatment: Gait belt Activity  Tolerance: Patient tolerated treatment well Patient left: in chair;with call bell/phone within reach;with chair alarm set     Time: 7121-9758 PT Time Calculation (min) (ACUTE ONLY): 28 min  Charges:  $Gait Training: 8-22 mins $Therapeutic Activity: 8-22 mins                    G Codes:      Rolinda Roan Mar 23, 2015, 9:59 AM   Rolinda Roan, PT, DPT Acute Rehabilitation Services Pager: [phone]

## 2015-03-06 NOTE — Progress Notes (Signed)
          Daily Rounding Note  03/06/2015, 3:05 PM  LOS: 3 days   SUBJECTIVE:       Today's upper endoscopy was canceled/postponed due to scheduling conflicts. Stool yesterday was dark, not tarry.  Feels well, not dizzy or weak.  No nausea, tolerating soft diet.   OBJECTIVE:         Vital signs in last 24 hours:    Temp:  [98.1 F (36.7 C)-99 F (37.2 C)] 98.3 F (36.8 C) (03/07 0749) Pulse Rate:  [58-85] 58 (03/07 0749) Resp:  [16-20] 18 (03/07 0749) BP: (144-169)/(40-61) 168/56 mmHg (03/07 0749) SpO2:  [96 %-100 %] 100 % (03/07 0749) Last BM Date: 03/05/15 Filed Weights   03/02/15 2030 03/03/15 2100 03/04/15 1957  Weight: 142 lb 4.8 oz (64.547 kg) 145 lb 6.4 oz (65.953 kg) 146 lb 3.2 oz (66.316 kg)   General: pleasant, aged but not ill appearing   Heart: RRR Chest: clear bil.  No labored breathing or cough Abdomen: soft, NT, ND.  No mass or HSM  Extremities: no CCE Neuro/Psych:  Pleasant, oriented x 3.  Very alert. No gross deficits.   Intake/Output from previous day: 03/06 0701 - 03/07 0700 In: 65 [P.O.:580] Out: 650 [Urine:650]  Intake/Output this shift: Total I/O In: 240 [P.O.:240] Out: 0   Lab Results:  Recent Labs  03/04/15 0454 03/05/15 0601  WBC  --  8.3  HGB 8.3* 8.5*  HCT 25.2* 25.6*  PLT  --  125*   BMET  Recent Labs  03/05/15 0601  NA 142  K 3.9  CL 113*  CO2 22  GLUCOSE 171*  BUN 34*  CREATININE 1.45*  CALCIUM 8.6   LFT No results for input(s): PROT, ALBUMIN, AST, ALT, ALKPHOS, BILITOT, BILIDIR, IBILI in the last 72 hours. PT/INR No results for input(s): LABPROT, INR in the last 72 hours. Hepatitis Panel No results for input(s): HEPBSAG, HCVAB, HEPAIGM, HEPBIGM in the last 72 hours.  Studies/Results: No results found.  Scheduled Meds: . insulin aspart  0-9 Units Subcutaneous 6 times per day  . levothyroxine  88 mcg Oral Once per day on Mon Tue Thu Fri Sat  . pantoprazole  40  mg Oral BID  . pravastatin  20 mg Oral q1800  . pregabalin  75 mg Oral BID  . sodium chloride  3 mL Intravenous Q12H   Continuous Infusions:  PRN Meds:.acetaminophen, traMADol   ASSESMENT:   *   FOBT +, dizziness.  S/p 03/03/15 EGD with endoclipping of ulcer with oozing blood and visible vessel in a region of abnormally enlarged gastric fold just distal to the GE junction. Continues on twice a day Protonix, was on daily Omeprazole PTA.  Plans for reflux endoscopy and biopsy canceled for today.  *  Blood loss anemia .  S/p 2 units PRBCs. Hgb stable.  *  Complaint of vertigo, resolved. Acute right cerebellar CVA confirmed by MRI.  Probably not a candidate for long-term anticoagulation.   *  CKD.    *  DM.  Oral agents and SSI at home.     PLAN   *  Has egd set for tomorrow at 3pm, however this may change as Dr Hilarie Fredrickson considering delaying repeat EGD/biospy for several weeks, allowing time for ulcer to heal     Azucena Freed  03/06/2015, 3:05 PM Pager: [phone]

## 2015-03-06 NOTE — Progress Notes (Signed)
[facility] PROGRESS NOTE  [name] BFX:[id number] DOB: June 24, 1915 DOA: 03/02/2015 PCP: Elayne Snare, MD  Assessment/Plan:    Acute gastric ulcer on abnormal gastric fold with bleeding, s/p clips: h/h stable s - repeat EGD today to ensure healing and possible bx of area. Advance to soft diet.  BID PPI    Acute ischemic stroke: MRI shows ACUTE right cerebellar CVA, which could explain vertigo. Symptoms improved. Continue tele.  Echo and carotids ok  hgb a1c 8.5. LDL 53.  No antiplatelets due to GIB.  Elderly, but lives alone and very independent.  Neuro checks. Pt/ot eval CIR. No swallowing/speech or cognition issues.    Hypothyroidism   DM type 2 (diabetes mellitus, type 2) stable on SSI   Diabetic neuropathy   Chronic kidney disease, stage III (moderate)   Vertigo, syncope, multifactorial: due to GIB AND acute cerebellar stroke. Symptoms improved   Acute blood loss anemia: s/p 2 units pRBC   DM (diabetes mellitus), type 2 with renal complications: SSI   Code Status:  full Family Communication:  Called son 3/6 Disposition Plan:  CIR?  Consultants:  GI  Procedures:   EGD, bx, clip  Antibiotics:    HPI/Subjective: Feeling better this AM, no dizziness  Objective: Filed Vitals:   03/06/15 0749  BP: 168/56  Pulse: 58  Temp: 98.3 F (36.8 C)  Resp: 18    Intake/Output Summary (Last 24 hours) at 03/06/15 0949 Last data filed at 03/06/15 0600  Gross per 24 hour  Intake    460 ml  Output    650 ml  Net   -190 ml   Filed Weights   03/02/15 2030 03/03/15 2100 03/04/15 1957  Weight: 64.547 kg (142 lb 4.8 oz) 65.953 kg (145 lb 6.4 oz) 66.316 kg (146 lb 3.2 oz)    Exam:   General:  A, o, talkative  Cardiovascular: RRR without MGR  Respiratory: CTA without WRR  Abdomen: S, NT, ND  Ext: no CCE Neuro: CN intact, motor 5/5, finger to nose normal  Basic Metabolic Panel:  Recent Labs Lab 03/02/15 1030 03/03/15 0237 03/05/15 0601  NA 140 138  142  K 5.4* 4.8 3.9  CL 109 112 113*  CO2 27 21 22   GLUCOSE 238* 91 171*  BUN 37* 39* 34*  CREATININE 1.41* 1.37* 1.45*  CALCIUM 8.7 8.1* 8.6   Liver Function Tests:  Recent Labs Lab 03/02/15 1030  AST 21  ALT 14  ALKPHOS 63  BILITOT 0.3  PROT 5.7*  ALBUMIN 3.3*   No results for input(s): LIPASE, AMYLASE in the last 168 hours. No results for input(s): AMMONIA in the last 168 hours. CBC:  Recent Labs Lab 03/02/15 1030  03/03/15 0208 03/03/15 0237 03/03/15 1050 03/04/15 0454 03/05/15 0601  WBC 10.9*  --   --  8.6  --   --  8.3  HGB 10.1*  < > 6.7* 6.8* 8.7* 8.3* 8.5*  HCT 31.1*  < > 20.6* 20.1* 25.8* 25.2* 25.6*  MCV 95.1  --   --  92.2  --   --  91.1  PLT 145*  --   --  111*  --   --  125*  < > = values in this interval not displayed. Cardiac Enzymes:  Recent Labs Lab 03/02/15 1837 03/02/15 2359 03/03/15 0237  TROPONINI <0.03 <0.03 0.03   BNP (last 3 results) No results for input(s): BNP in the last 8760 hours.  ProBNP (last 3 results) No results for input(s): PROBNP in  the last 8760 hours. Lipid Panel     Component Value Date/Time   CHOL 110 03/04/2015 0454   TRIG 86 03/04/2015 0454   HDL 40 03/04/2015 0454   CHOLHDL 2.8 03/04/2015 0454   VLDL 17 03/04/2015 0454   LDLCALC 53 03/04/2015 0454    CBG:  Recent Labs Lab 03/05/15 1144 03/05/15 1712 03/06/15 0045 03/06/15 0443 03/06/15 0747  GLUCAP 204* 188* 141* 176* 132*    No results found for this or any previous visit (from the past 240 hour(s)).   Studies: No results found.  Scheduled Meds: . insulin aspart  0-9 Units Subcutaneous 6 times per day  . levothyroxine  88 mcg Oral Once per day on Mon Tue Thu Fri Sat  . pantoprazole (PROTONIX) IV  40 mg Intravenous Q12H  . pravastatin  20 mg Oral q1800  . pregabalin  75 mg Oral BID  . sodium chloride  3 mL Intravenous Q12H   Continuous Infusions:    Time spent: 25 minutes  VANN, JESSICA  [facility] [email or web address],  password [facility] 03/06/2015, 9:49 AM  LOS: 3 days

## 2015-03-06 NOTE — Progress Notes (Signed)
Rehab admissions - Evaluated for possible admission.  I met with patient, her son and her dtr-in-law.  Son is interested in inpatient rehab but also interested in SNF.  I will have social worker get involved as well.  I will open the case and request acute inpatient rehab admission per son's request as well.  Call me for questions.  #[phone]

## 2015-03-07 ENCOUNTER — Encounter (HOSPITAL_COMMUNITY): Payer: Self-pay | Admitting: Certified Registered Nurse Anesthetist

## 2015-03-07 ENCOUNTER — Inpatient Hospital Stay (HOSPITAL_COMMUNITY): Payer: Commercial Managed Care - HMO | Admitting: Certified Registered Nurse Anesthetist

## 2015-03-07 ENCOUNTER — Encounter (HOSPITAL_COMMUNITY)
Admission: EM | Disposition: A | Discharge status: Skilled Nursing Facility | Payer: Self-pay | Source: Home / Self Care | Attending: Internal Medicine

## 2015-03-07 HISTORY — PX: ESOPHAGOGASTRODUODENOSCOPY: SHX5428

## 2015-03-07 LAB — TYPE AND SCREEN
ABO/RH(D): O NEG
Antibody Screen: POSITIVE
DAT, IgG: NEGATIVE
DONOR AG TYPE: NEGATIVE
PT AG TYPE: NEGATIVE
UNIT DIVISION: 0
Unit division: 0

## 2015-03-07 LAB — GLUCOSE, CAPILLARY
Glucose-Capillary: 144 mg/dL — ABNORMAL HIGH (ref 70–99)
Glucose-Capillary: 147 mg/dL — ABNORMAL HIGH (ref 70–99)
Glucose-Capillary: 152 mg/dL — ABNORMAL HIGH (ref 70–99)
Glucose-Capillary: 156 mg/dL — ABNORMAL HIGH (ref 70–99)
Glucose-Capillary: 157 mg/dL — ABNORMAL HIGH (ref 70–99)
Glucose-Capillary: 248 mg/dL — ABNORMAL HIGH (ref 70–99)
Glucose-Capillary: 99 mg/dL (ref 70–99)

## 2015-03-07 LAB — BASIC METABOLIC PANEL
ANION GAP: 4 — AB (ref 5–15)
BUN: 28 mg/dL — ABNORMAL HIGH (ref 6–23)
CALCIUM: 8.5 mg/dL (ref 8.4–10.5)
CO2: 25 mmol/L (ref 19–32)
CREATININE: 1.64 mg/dL — AB (ref 0.50–1.10)
Chloride: 112 mmol/L (ref 96–112)
GFR calc non Af Amer: 25 mL/min — ABNORMAL LOW (ref >90)
GFR, EST AFRICAN AMERICAN: 29 mL/min — AB (ref >90)
Glucose, Bld: 192 mg/dL — ABNORMAL HIGH (ref 70–99)
Potassium: 4 mmol/L (ref 3.5–5.1)
Sodium: 141 mmol/L (ref 135–145)

## 2015-03-07 LAB — CBC
HCT: 26.3 % — ABNORMAL LOW (ref 36.0–46.0)
Hemoglobin: 8.5 g/dL — ABNORMAL LOW (ref 12.0–15.0)
MCH: 30.2 pg (ref 26.0–34.0)
MCHC: 32.3 g/dL (ref 30.0–36.0)
MCV: 93.6 fL (ref 78.0–100.0)
PLATELETS: 160 10*3/uL (ref 150–400)
RBC: 2.81 MIL/uL — ABNORMAL LOW (ref 3.87–5.11)
RDW: 14.5 % (ref 11.5–15.5)
WBC: 7.9 10*3/uL (ref 4.0–10.5)

## 2015-03-07 SURGERY — EGD (ESOPHAGOGASTRODUODENOSCOPY)
Anesthesia: Monitor Anesthesia Care

## 2015-03-07 MED ORDER — PROPOFOL INFUSION 10 MG/ML OPTIME
INTRAVENOUS | Status: DC | PRN
  Administered 2015-03-07: 50 ug/kg/min via INTRAVENOUS

## 2015-03-07 MED ORDER — LIDOCAINE HCL (CARDIAC) 20 MG/ML IV SOLN
INTRAVENOUS | Status: DC | PRN
  Administered 2015-03-07: 50 mg via INTRAVENOUS

## 2015-03-07 MED ORDER — BUTAMBEN-TETRACAINE-BENZOCAINE 2-2-14 % EX AERO
INHALATION_SPRAY | CUTANEOUS | Status: DC | PRN
  Administered 2015-03-07: 2 via TOPICAL

## 2015-03-07 MED ORDER — HYDRALAZINE HCL 20 MG/ML IJ SOLN
INTRAMUSCULAR | Status: DC | PRN
  Administered 2015-03-07: 5 mg via INTRAVENOUS

## 2015-03-07 MED ORDER — FENTANYL CITRATE 0.05 MG/ML IJ SOLN
INTRAMUSCULAR | Status: DC | PRN
  Administered 2015-03-07: 25 ug via INTRAVENOUS

## 2015-03-07 MED ORDER — LACTATED RINGERS IV SOLN
INTRAVENOUS | Status: DC | PRN
  Administered 2015-03-07: 15:00:00 via INTRAVENOUS

## 2015-03-07 NOTE — Op Note (Signed)
[facility] [place], [id number]   ENDOSCOPY PROCEDURE REPORT  PATIENT: Bonnie, Dominguez  MR#: [id number] BIRTHDATE: 02/18/1915 , 89  yrs. old GENDER: female ENDOSCOPIST: Jerene Bears, MD REFERRED BY:  [facility] PROCEDURE DATE:  03/07/2015 PROCEDURE:  EGD w/ biopsy ASA CLASS:     Class III INDICATIONS:  hematemesis. MEDICATIONS: Monitored anesthesia care and Per Anesthesia TOPICAL ANESTHETIC: Cetacaine Spray  DESCRIPTION OF PROCEDURE: After the risks benefits and alternatives of the procedure were thoroughly explained, informed consent was obtained.  The PENTAX GASTROOSCOPE [id number] endoscope was introduced through the mouth and advanced to the second portion of the duodenum , Without limitations.  The instrument was slowly withdrawn as the mucosa was fully examined.  ESOPHAGUS: The mucosa of the esophagus appeared normal.  STOMACH: A small hiatal hernia was noted.   Non-bleeding cameron's ulcer was found in the cardia with one adherent hemostatic clip. Today this appears to be a benign process, though biopsies were performed using cold forceps around this area.   The stomach otherwise appeared normal.  DUODENUM: The duodenal mucosa showed no abnormalities in the bulb and 2nd part of the duodenum.  Retroflexed views revealed as previously described.     The scope was then withdrawn from the patient and the procedure completed.  COMPLICATIONS: There were no immediate complications.  ENDOSCOPIC IMPRESSION: 1.   The mucosa of the esophagus appeared normal 2.   Small hiatal hernia 3.   Cameron's ulcer was found in the cardia with 1 hemostatic clip in place; multiple biopsies was performed 4.   The stomach otherwise appeared normal 5.   The duodenal mucosa showed no abnormalities in the bulb and 2nd part of the duodenum  RECOMMENDATIONS: 1.  Continue twice daily PPI for 1 month 2.  Avoid NSAIDs 3.  Await pathology  results 4.  Monitor Hgb to ensure improvement, replace iron as needed  eSigned:  Jerene Bears, MD 03/07/2015 3:11 PM     CC: the patient, PCP

## 2015-03-07 NOTE — Anesthesia Preprocedure Evaluation (Signed)
Anesthesia Evaluation  Patient identified by MRN, date of birth, ID band  Reviewed: Allergy & Precautions, NPO status , Patient's Chart, lab work & pertinent test results  Airway        Dental   Pulmonary          Cardiovascular  EF 60-65% 2016   Neuro/Psych Carotids without significant obst    GI/Hepatic PUD,   Endo/Other  diabetes, Insulin Dependent  Renal/GU Creat 1.4     Musculoskeletal   Abdominal   Peds  Hematology 8/26   Anesthesia Other Findings   Reproductive/Obstetrics                             Anesthesia Physical Anesthesia Plan  ASA: III  Anesthesia Plan: MAC   Post-op Pain Management:    Induction: Intravenous  Airway Management Planned: Nasal Cannula  Additional Equipment:   Intra-op Plan:   Post-operative Plan:   Informed Consent: I have reviewed the patients History and Physical, chart, labs and discussed the procedure including the risks, benefits and alternatives for the proposed anesthesia with the patient or authorized representative who has indicated his/her understanding and acceptance.     Plan Discussed with:   Anesthesia Plan Comments:         Anesthesia Quick Evaluation

## 2015-03-07 NOTE — Progress Notes (Signed)
[facility] PROGRESS NOTE  Shamekia Tippets Heroux KGY:[id number] DOB: 1915-09-22 DOA: 03/02/2015 PCP: Elayne Snare, MD  Assessment/Plan:    Acute gastric ulcer on abnormal gastric fold with bleeding, s/p clips: h/h stable  - repeat EGD today to ensure healing and possible bx of area. Advance to soft diet.  BID PPI    Acute ischemic stroke: MRI shows ACUTE right cerebellar CVA, which could explain vertigo. Symptoms improved. Echo and carotids ok  hgb a1c 8.5. LDL 53.  No antiplatelets due to GIB.  Elderly, but lives alone and very independent.  Neuro checks. Pt/ot eval CIR. No swallowing/speech or cognition issues.    Hypothyroidism   DM type 2 (diabetes mellitus, type 2) stable on SSI   Diabetic neuropathy   Chronic kidney disease, stage III (moderate)   Vertigo, syncope, multifactorial: due to GIB AND acute cerebellar stroke. Symptoms improved   Acute blood loss anemia: s/p 2 units pRBC   DM (diabetes mellitus), type 2 with renal complications: SSI   Code Status:  full Family Communication:  Called son 3/6 Disposition Plan:  CIR? Vs SNF   Consultants:  GI  Procedures:   EGD, bx, clip  Antibiotics:    HPI/Subjective: Not sleeping well  Objective: Filed Vitals:   03/07/15 0905  BP: 141/43  Pulse: 67  Temp: 97.6 F (36.4 C)  Resp: 16    Intake/Output Summary (Last 24 hours) at 03/07/15 1115 Last data filed at 03/06/15 2352  Gross per 24 hour  Intake    480 ml  Output    900 ml  Net   -420 ml   Filed Weights   03/03/15 2100 03/04/15 1957 03/06/15 1959  Weight: 65.953 kg (145 lb 6.4 oz) 66.316 kg (146 lb 3.2 oz) 64.411 kg (142 lb)    Exam:   General:  A, o, talkative  Cardiovascular: RRR without MGR  Respiratory: CTA without WRR  Abdomen: S, NT, ND  Ext: no CCE   Basic Metabolic Panel:  Recent Labs Lab 03/02/15 1030 03/03/15 0237 03/05/15 0601 03/07/15 0621  NA 140 138 142 141  K 5.4* 4.8 3.9 4.0  CL 109 112 113* 112  CO2 27 21 22 25    GLUCOSE 238* 91 171* 192*  BUN 37* 39* 34* 28*  CREATININE 1.41* 1.37* 1.45* 1.64*  CALCIUM 8.7 8.1* 8.6 8.5   Liver Function Tests:  Recent Labs Lab 03/02/15 1030  AST 21  ALT 14  ALKPHOS 63  BILITOT 0.3  PROT 5.7*  ALBUMIN 3.3*   No results for input(s): LIPASE, AMYLASE in the last 168 hours. No results for input(s): AMMONIA in the last 168 hours. CBC:  Recent Labs Lab 03/02/15 1030  03/03/15 0237 03/03/15 1050 03/04/15 0454 03/05/15 0601 03/07/15 0621  WBC 10.9*  --  8.6  --   --  8.3 7.9  HGB 10.1*  < > 6.8* 8.7* 8.3* 8.5* 8.5*  HCT 31.1*  < > 20.1* 25.8* 25.2* 25.6* 26.3*  MCV 95.1  --  92.2  --   --  91.1 93.6  PLT 145*  --  111*  --   --  125* 160  < > = values in this interval not displayed. Cardiac Enzymes:  Recent Labs Lab 03/02/15 1837 03/02/15 2359 03/03/15 0237  TROPONINI <0.03 <0.03 0.03   BNP (last 3 results) No results for input(s): BNP in the last 8760 hours.  ProBNP (last 3 results) No results for input(s): PROBNP in the last 8760 hours. Lipid Panel  Component Value Date/Time   CHOL 110 03/04/2015 0454   TRIG 86 03/04/2015 0454   HDL 40 03/04/2015 0454   CHOLHDL 2.8 03/04/2015 0454   VLDL 17 03/04/2015 0454   LDLCALC 53 03/04/2015 0454    CBG:  Recent Labs Lab 03/06/15 1607 03/06/15 2006 03/06/15 2356 03/07/15 0434 03/07/15 0727  GLUCAP 220* 130* 144* 152* 157*    No results found for this or any previous visit (from the past 240 hour(s)).   Studies: No results found.  Scheduled Meds: . insulin aspart  0-9 Units Subcutaneous 6 times per day  . levothyroxine  88 mcg Oral Once per day on Mon Tue Thu Fri Sat  . pantoprazole  40 mg Oral BID  . pravastatin  20 mg Oral q1800  . pregabalin  75 mg Oral BID  . sodium chloride  3 mL Intravenous Q12H   Continuous Infusions:    Time spent: 25 minutes  Karcyn Menn  [facility] [email or web address], password [facility] 03/07/2015, 11:15 AM  LOS: 4 days

## 2015-03-07 NOTE — Progress Notes (Signed)
Rehab admissions - I spoke with patient this am.  She referred me back to her son.  Son confirmed that he would like acute inpatient rehab admission.  I am waiting on insurance carrier for decision.  I will update all once I hear back from insurance case manager.  Call me for questions.  #[phone]

## 2015-03-07 NOTE — Progress Notes (Signed)
°   03/07/15 4917  Clinical Encounter Type  Visited With Patient  Visit Type Initial;Spiritual support  Referral From Nurse  Spiritual Encounters  Spiritual Needs Emotional  Stress Factors  Patient Stress Factors Health changes;Major life changes  Advance Directives (For Healthcare)  Does patient have an advance directive? Yes  Does patient want to make changes to advanced directive? No - Patient declined    Responded to request to visit patient. Patient was reclining in bedside chair upon arrival. The room was bright from the sun, and several vase of flowers were on the window and table. Patient stated that she is awaiting a test today to see what is causing her stool to be dark and bloody. Patient has strong family support, her son and former daughter in law visited earlier and she said that brightened her day. Patient lives alone and is preparing to tell her son that she no longer wishes to drive and hand over her car key. Patient stated that she was up and mobile and driving before she came to hospital. Patient has a group of friends that she is goes out with to have fun, they go to the mall, out to eat, and sometimes to the movies. I provided emotional support to the patient and a listening ear as the patient shared about her past and how she is coping with the changes of being in the hospital. Patient's only concern was why she needed physical therapy, "Why do they want me stronger than when I came in the hospital?" Patient is satisfied with all aspects of her stay. Patient is strong in her faith and stated that it is just time for her to slow down a little as she isn't getting younger.  Drue Dun, Chaplain Intern 03/07/2015, 10:44 AM

## 2015-03-07 NOTE — Anesthesia Postprocedure Evaluation (Signed)
  Anesthesia Post-op Note  Patient: [name]  Procedure(s) Performed: Procedure(s): ESOPHAGOGASTRODUODENOSCOPY (EGD) (N/A)  Patient Location: PACU  Anesthesia Type:General  Level of Consciousness: awake and alert   Airway and Oxygen Therapy: Patient Spontanous Breathing  Post-op Pain: mild  Post-op Assessment: Post-op Vital signs reviewed and Patient's Cardiovascular Status Stable    Post-op Vital Signs: Reviewed and stable    Last Vitals:  Filed Vitals:   03/07/15 1517  BP: 173/43  Pulse: 61  Temp: 36.5 C  Resp: 16    Complications: No apparent anesthesia complications

## 2015-03-07 NOTE — Transfer of Care (Signed)
Immediate Anesthesia Transfer of Care Note  Patient: [name]  Procedure(s) Performed: Procedure(s): ESOPHAGOGASTRODUODENOSCOPY (EGD) (N/A)  Patient Location: PACU and Endoscopy Unit  Anesthesia Type:MAC  Level of Consciousness: awake and alert   Airway & Oxygen Therapy: Patient Spontanous Breathing and Patient connected to nasal cannula oxygen  Post-op Assessment: Report given to RN and Post -op Vital signs reviewed and stable  Post vital signs: Reviewed and stable  Last Vitals:  Filed Vitals:   03/07/15 1517  BP: 173/43  Pulse: 61  Temp: 36.5 C  Resp: 16    Complications: No apparent anesthesia complications

## 2015-03-07 NOTE — Progress Notes (Signed)
Physical Therapy Treatment Patient Details Name: [name] MRN: [id number] DOB: [birth year] Today's Date: 03/07/2015    History of Present Illness Who lives home alone and gets around well with her walker. She presents to the ER with family after waking up with dizziness this AM when she was going to the bathroom. Dizziness is NOT just when she stands up.. MRI: Acute nonhemorrhagic infarct within the right superior cerebellum    PT Comments    Pt reports that after family visit yesterday, she realizes that she will not have 24 hour assistance at home as she stated during yesterday's session. After further discussion with family and MD yesterday, pt agreeable to rehab now, as yesterday she was planning on returning home at d/c. In light of limited assist available at home and continued functional decline (pt independent PTA with no use of AD), PT will again recommend continued therapies at the CIR level.   Follow Up Recommendations  CIR;Supervision/Assistance - 24 hour     Equipment Recommendations  None recommended by PT    Recommendations for Other Services Rehab consult     Precautions / Restrictions Precautions Precautions: Fall Restrictions Weight Bearing Restrictions: No    Mobility  Bed Mobility Overal bed mobility: Needs Assistance Bed Mobility: Supine to Sit     Supine to sit: Supervision;HOB elevated     General bed mobility comments: Pt transitioned to sidelying and then to sitting with HOB elevated and use of rail.   Transfers Overall transfer level: Needs assistance Equipment used: Rolling walker (2 wheeled) Transfers: Sit to/from Stand Sit to Stand: Min guard         General transfer comment: Pt was able to power-up to full standing position without assistance.  Ambulation/Gait Ambulation/Gait assistance: Min guard Ambulation Distance (Feet): 125 Feet Assistive device: Rolling walker (2 wheeled) Gait Pattern/deviations: Step-through  pattern;Decreased stride length;Trunk flexed;Narrow base of support Gait velocity: slow and guarded Gait velocity interpretation: Below normal speed for age/gender General Gait Details: Pt was able to ambulate with RW and hands-on assist (no LOB noted). Pt ambulating slowly, and when therapist inquired about baseline gait speed, pt states "I guess I am going a little faster than normal" and slowed her pace down even more. Pt was cued for improved posture and to look up while ambulating.    Stairs            Wheelchair Mobility    Modified Rankin (Stroke Patients Only) Modified Rankin (Stroke Patients Only) Pre-Morbid Rankin Score: No symptoms Modified Rankin: Moderately severe disability     Balance Overall balance assessment: Needs assistance Sitting-balance support: No upper extremity supported;Feet supported Sitting balance-Leahy Scale: Fair     Standing balance support: Bilateral upper extremity supported Standing balance-Leahy Scale: Poor Standing balance comment: Requires UE support or assist to maintain dynamic standing balance.                     Cognition Arousal/Alertness: Awake/alert Behavior During Therapy: WFL for tasks assessed/performed Overall Cognitive Status: History of cognitive impairments - at baseline       Memory: Decreased short-term memory              Exercises General Exercises - Lower Extremity Quad Sets: Both (12 reps) Gluteal Sets: Both (12 reps) Long Arc Quad: Both (12 reps) Hip ABduction/ADduction: Both (Isometric adduction and AROM abduction - 12 reps each)    General Comments        Pertinent Vitals/Pain Pain Assessment: No/denies pain  Home Living                      Prior Function            PT Goals (current goals can now be found in the care plan section) Acute Rehab PT Goals Patient Stated Goal: to be able to get back home after rehab PT Goal Formulation: With patient Time For Goal  Achievement: 03/18/15 Potential to Achieve Goals: Good Progress towards PT goals: Progressing toward goals    Frequency  Min 3X/week    PT Plan Discharge plan needs to be updated    Co-evaluation             End of Session Equipment Utilized During Treatment: Gait belt Activity Tolerance: Patient tolerated treatment well Patient left: in chair;with call bell/phone within reach;with chair alarm set     Time: 0922-0945 PT Time Calculation (min) (ACUTE ONLY): 23 min  Charges:  $Gait Training: 8-22 mins $Therapeutic Exercise: 8-22 mins                    G Codes:      Rolinda Roan Mar 13, 2015, 10:01 AM  Rolinda Roan, PT, DPT Acute Rehabilitation Services Pager: [phone]

## 2015-03-07 NOTE — Clinical Social Work Psychosocial (Signed)
Clinical Social Work Department BRIEF PSYCHOSOCIAL ASSESSMENT 03/07/2015  Patient:  [name], [name]     Account Number:  [id number]     Admit date:  03/02/2015  Clinical Social Worker:  Frederico Hamman  Date/Time:  03/07/2015 03:58 AM  Referred by:  Physician  Date Referred:  03/07/2015 Referred for  SNF Placement   Other Referral:   Interview type:  Family Other interview type:    PSYCHOSOCIAL DATA Living Status:  ALONE Admitted from facility:   Level of care:   Primary support name:  Economist Primary support relationship to patient:  CHILD, ADULT Degree of support available:   Strong support    CURRENT CONCERNS Current Concerns  Post-Acute Placement   Other Concerns:    SOCIAL WORK ASSESSMENT / PLAN Patient is currently being evaluated by Inpatient Rehab (CIR). CSW talked by phone with patient's son regarding skilled facility placement  as a back-up to CIR in case patient's insurance denies inpatient rehab and he is in agreement. Mr. Recktenwald did not have any questions for CSW.   Assessment/plan status:  Psychosocial Support/Ongoing Assessment of Needs Other assessment/ plan:   Information/referral to community resources:   Son will be provided with a list of skilled facilities in [place].    PATIENT'S/FAMILY'S RESPONSE TO PLAN OF CARE: Family's preference is inpatient rehab, however son agreeable to skilled facility search as a back-up.       Daliah Chaudoin Givens, MSW, LCSW Licensed Clinical Social Worker [place] [phone]

## 2015-03-08 ENCOUNTER — Encounter (HOSPITAL_COMMUNITY): Payer: Self-pay | Admitting: Internal Medicine

## 2015-03-08 DIAGNOSIS — R2681 Unsteadiness on feet: Secondary | ICD-10-CM | POA: Diagnosis not present

## 2015-03-08 DIAGNOSIS — K250 Acute gastric ulcer with hemorrhage: Secondary | ICD-10-CM | POA: Diagnosis not present

## 2015-03-08 DIAGNOSIS — I6350 Cerebral infarction due to unspecified occlusion or stenosis of unspecified cerebral artery: Secondary | ICD-10-CM | POA: Diagnosis not present

## 2015-03-08 DIAGNOSIS — R42 Dizziness and giddiness: Secondary | ICD-10-CM | POA: Diagnosis not present

## 2015-03-08 DIAGNOSIS — E1129 Type 2 diabetes mellitus with other diabetic kidney complication: Secondary | ICD-10-CM | POA: Diagnosis not present

## 2015-03-08 DIAGNOSIS — R5381 Other malaise: Secondary | ICD-10-CM | POA: Diagnosis not present

## 2015-03-08 DIAGNOSIS — E1142 Type 2 diabetes mellitus with diabetic polyneuropathy: Secondary | ICD-10-CM | POA: Diagnosis not present

## 2015-03-08 DIAGNOSIS — D500 Iron deficiency anemia secondary to blood loss (chronic): Secondary | ICD-10-CM | POA: Diagnosis not present

## 2015-03-08 DIAGNOSIS — M6281 Muscle weakness (generalized): Secondary | ICD-10-CM | POA: Diagnosis not present

## 2015-03-08 DIAGNOSIS — I639 Cerebral infarction, unspecified: Secondary | ICD-10-CM | POA: Diagnosis not present

## 2015-03-08 DIAGNOSIS — E1340 Other specified diabetes mellitus with diabetic neuropathy, unspecified: Secondary | ICD-10-CM | POA: Diagnosis not present

## 2015-03-08 DIAGNOSIS — L8990 Pressure ulcer of unspecified site, unspecified stage: Secondary | ICD-10-CM | POA: Diagnosis not present

## 2015-03-08 DIAGNOSIS — D62 Acute posthemorrhagic anemia: Secondary | ICD-10-CM | POA: Diagnosis not present

## 2015-03-08 DIAGNOSIS — K5900 Constipation, unspecified: Secondary | ICD-10-CM | POA: Diagnosis not present

## 2015-03-08 DIAGNOSIS — E1140 Type 2 diabetes mellitus with diabetic neuropathy, unspecified: Secondary | ICD-10-CM | POA: Diagnosis not present

## 2015-03-08 DIAGNOSIS — N183 Chronic kidney disease, stage 3 (moderate): Secondary | ICD-10-CM | POA: Diagnosis not present

## 2015-03-08 DIAGNOSIS — K2970 Gastritis, unspecified, without bleeding: Secondary | ICD-10-CM | POA: Diagnosis not present

## 2015-03-08 DIAGNOSIS — E1122 Type 2 diabetes mellitus with diabetic chronic kidney disease: Secondary | ICD-10-CM | POA: Diagnosis not present

## 2015-03-08 DIAGNOSIS — I10 Essential (primary) hypertension: Secondary | ICD-10-CM | POA: Diagnosis not present

## 2015-03-08 DIAGNOSIS — E039 Hypothyroidism, unspecified: Secondary | ICD-10-CM | POA: Diagnosis not present

## 2015-03-08 LAB — BASIC METABOLIC PANEL
ANION GAP: 7 (ref 5–15)
BUN: 27 mg/dL — AB (ref 6–23)
CHLORIDE: 109 mmol/L (ref 96–112)
CO2: 24 mmol/L (ref 19–32)
CREATININE: 1.43 mg/dL — AB (ref 0.50–1.10)
Calcium: 8.8 mg/dL (ref 8.4–10.5)
GFR calc Af Amer: 34 mL/min — ABNORMAL LOW (ref >90)
GFR calc non Af Amer: 30 mL/min — ABNORMAL LOW (ref >90)
Glucose, Bld: 188 mg/dL — ABNORMAL HIGH (ref 70–99)
POTASSIUM: 4.2 mmol/L (ref 3.5–5.1)
Sodium: 140 mmol/L (ref 135–145)

## 2015-03-08 LAB — CBC
HCT: 26.7 % — ABNORMAL LOW (ref 36.0–46.0)
Hemoglobin: 8.7 g/dL — ABNORMAL LOW (ref 12.0–15.0)
MCH: 31 pg (ref 26.0–34.0)
MCHC: 32.6 g/dL (ref 30.0–36.0)
MCV: 95 fL (ref 78.0–100.0)
Platelets: 166 10*3/uL (ref 150–400)
RBC: 2.81 MIL/uL — ABNORMAL LOW (ref 3.87–5.11)
RDW: 14.4 % (ref 11.5–15.5)
WBC: 7.8 10*3/uL (ref 4.0–10.5)

## 2015-03-08 LAB — GLUCOSE, CAPILLARY
GLUCOSE-CAPILLARY: 154 mg/dL — AB (ref 70–99)
Glucose-Capillary: 130 mg/dL — ABNORMAL HIGH (ref 70–99)
Glucose-Capillary: 173 mg/dL — ABNORMAL HIGH (ref 70–99)

## 2015-03-08 MED ORDER — PANTOPRAZOLE SODIUM 40 MG PO TBEC: 40.0000 mg | DELAYED_RELEASE_TABLET | Freq: Two times a day (BID) | ORAL | Status: DC

## 2015-03-08 MED ORDER — TRAMADOL HCL 50 MG PO TABS: 50.0000 mg | ORAL_TABLET | Freq: Two times a day (BID) | ORAL | Status: DC

## 2015-03-08 NOTE — Clinical Social Work Placement (Signed)
Clinical Social Work Department CLINICAL SOCIAL WORK PLACEMENT NOTE 03/08/2015  Patient:  [name], [name]  Account Number:  [id number] Admit date:  03/02/2015  Clinical Social Worker:  Latona Krichbaum Givens, LCSW  Date/time:  03/08/2015 03:37 AM  Clinical Social Work is seeking post-discharge placement for this patient at the following level of care:   SKILLED NURSING   (*CSW will update this form in Epic as items are completed)   03/08/2015  Patient/family provided with [place] Department of Clinical Social Work's list of facilities offering this level of care within the geographic area requested by the patient (or if unable, by the patient's family).  03/07/2015  Patient/family informed of their freedom to choose among providers that offer the needed level of care, that participate in Medicare, Medicaid or managed care program needed by the patient, have an available bed and are willing to accept the patient.    Patient/family informed of [facility]' ownership interest in [facility], as well as of the fact that they are under no obligation to receive care at this facility.  PASARR submitted to EDS on 03/07/2015 PASARR number received on 03/07/2015  FL2 transmitted to all facilities in geographic area requested by pt/family on  03/07/2015 FL2 transmitted to all facilities within larger geographic area on   Patient informed that his/her managed care company has contracts with or will negotiate with  certain facilities, including the following:  [employer] approved [facility] - [phone]   Patient/family informed of bed offers received:  03/08/2015 Patient chooses bed at [facility] Physician recommends and patient chooses bed at    Patient to be transferred to [place] [facility] on  03/08/2015 Patient to be transferred to facility by ambulance Patient and family notified of transfer on 03/08/2015 Name of family member notified:  Son, Kahlyn Shippey  The following physician request  were entered in Epic:  Additional Comments: 03/08/15: Inpatient rehab denied by patient's insurance. However rehab at a skilled facility approved. Patient chose [facility].   Mackinley Cassaday Givens, MSW, LCSW Licensed Clinical Social Worker [place] [phone]

## 2015-03-08 NOTE — Progress Notes (Signed)
Rehab admissions - I have received a denial from [facility] medical director for acute inpatient rehab admission.  I have called patient's son and daughter in law to inform them of the denial.  Family wishes to appeal the denial.  Patient is doing well with therapies ambulating 125 minguard assist.  Insurance carrier feels needs for rehab can be met at a lower level of care such as SNF.  I have notified Dr. Eliseo Squires of denial as well.  I will inform team when I receive the denial letter.  Cal me for questions.  #[phone]

## 2015-03-08 NOTE — Discharge Summary (Addendum)
Physician Discharge Summary  [name] WRU:[id number] DOB: July 20, 1915 DOA: 03/02/2015  PCP: Elayne Snare, MD  Admit date: 03/02/2015 Discharge date: 03/08/2015  Time spent: 35 minutes  Recommendations for Outpatient Follow-up:  1. Daily vitals 2. Cbc 1 week 3. PPI BID x 1 month 4. GI to follow up biopsy results 5. AVOID NSAIDS 6. Resume ASA when ok with GI  Discharge Diagnoses:  Principal Problem:   Acute gastric ulcer with bleeding Active Problems:   Hypothyroidism   DM type 2 (diabetes mellitus, type 2)   Diabetic neuropathy   Chronic kidney disease, stage III (moderate)   Dizzy   Acute blood loss anemia   Acute ischemic stroke   Syncope   DM (diabetes mellitus), type 2 with renal complications   Cerebellar stroke   Discharge Condition: improved  Diet recommendation: diabetic  Filed Weights   03/04/15 1957 03/06/15 1959 03/07/15 2007  Weight: 66.316 kg (146 lb 3.2 oz) 64.411 kg (142 lb) 62.37 kg (137 lb 8 oz)    History of present illness:  [name] is a 81 y.o. female  Who lives home alone and gets around well with her walker. She presents to the ER with family after waking up with dizziness this AM when she was going to the bathroom. Dizziness is NOT just when she stands up. She also c/o frontal head ache. She went back to bed and continued to have dizziness- she felt as if SHE was spinning. Last PM she went to [place] with friends and had no issues. No other leg weakness or arm weakness or slurring of speech. No chest pain, no SOB, no new meds, no sick contacts  She was found to have positive orthostatics in the ER and given 500cc with no improvement. She had a head CT that did not show any new infarcts.  Her Hgb was slightly low but heme negative. Hospitalist were asked to admit to work up the dizziness  Hospital Course:  Acute gastric ulcer on abnormal gastric fold with bleeding, s/p clips: h/h stable  - s/p repeat EGD: Continue twice  daily PPI for 1 month. Avoid NSAIDs BID PPI x 1 month   Acute ischemic stroke: MRI shows ACUTE right cerebellar CVA, which could explain vertigo. Symptoms improved. Echo and carotids ok hgb a1c 8.5. LDL 53. No antiplatelets due to GIB for now. Elderly, but lives alone and very independent. Neuro checks.  No swallowing/speech or cognition issues.   Hypothyroidism  DM type 2 (diabetes mellitus, type 2) stable on SSI  Diabetic neuropathy  Chronic kidney disease, stage III (moderate)  Vertigo, syncope, multifactorial: due to GIB AND acute cerebellar stroke. Symptoms improved  Acute blood loss anemia: s/p 2 units pRBC  DM (diabetes mellitus), type 2 with renal complications: SSI   Procedures:  EGD  Consultations:  GI  Discharge Exam: Filed Vitals:   03/08/15 0928  BP: 149/44  Pulse: 57  Temp: 98.4 F (36.9 C)  Resp: 18    General: A+OX3, NAD Cardiovascular: rrr Respiratory: clear  Discharge Instructions   Discharge Instructions    Diet Carb Modified    Complete by:  As directed      Discharge instructions    Complete by:  As directed   Cbc 1 week     Increase activity slowly    Complete by:  As directed           Current Discharge Medication List    START taking these medications   Details  pantoprazole (  PROTONIX) 40 MG tablet Take 1 tablet (40 mg total) by mouth 2 (two) times daily.      CONTINUE these medications which have CHANGED   Details  traMADol (ULTRAM) 50 MG tablet Take 1 tablet (50 mg total) by mouth 2 (two) times daily. Qty: 30 tablet, Refills: 0      CONTINUE these medications which have NOT CHANGED   Details  Calcium Carbonate-Vitamin D (CALTRATE 600+D) 600-400 MG-UNIT per tablet Take 1 tablet by mouth daily.    glimepiride (AMARYL) 2 MG tablet TAKE 1 TABLET BY MOUTH IN THE MORNING AND 1/2 TABLET AT DINNER Qty: 135 tablet, Refills: 0    insulin aspart (NOVOLOG FLEXPEN) 100 UNIT/ML FlexPen If blood sugar is over 200, take 5  units and if they go over 300, take 6 units at that meal. Qty: 15 mL, Refills: 2    levothyroxine (SYNTHROID, LEVOTHROID) 88 MCG tablet Take 1 tablet daily Qty: 90 tablet, Refills: 3    LYRICA 75 MG capsule TAKE 1 CAPSULE TWICE DAILY Qty: 180 capsule, Refills: 1    pravastatin (PRAVACHOL) 20 MG tablet     ACCU-CHEK FASTCLIX LANCETS MISC     glucose blood (ACCU-CHEK AVIVA PLUS) test strip Use to check blood sugar three times  a day Qty: 100 each, Refills: 3    Insulin Pen Needle (NOVOFINE) 32G X 6 MM MISC Use as directed Qty: 50 each, Refills: 3    Lancets Misc. (ACCU-CHEK FASTCLIX LANCET) KIT USE AS DIRECTED FOR TESTING Qty: 1 kit, Refills: 1    psyllium (METAMUCIL) 58.6 % powder Take 1 packet by mouth as directed.      STOP taking these medications     aspirin EC 81 MG tablet      Multiple Vitamin (MULTIVITAMIN WITH MINERALS) TABS      diclofenac sodium (VOLTAREN) 1 % GEL      Diclofenac Sodium 3 % GEL      omeprazole (PRILOSEC) 20 MG capsule        Allergies  Allergen Reactions  . Other     Ground red pepper lotion to rub on her knee caused SOB      The results of significant diagnostics from this hospitalization (including imaging, microbiology, ancillary and laboratory) are listed below for reference.    Significant Diagnostic Studies: Dg Chest 2 View  03/02/2015   CLINICAL DATA:  Dizziness and hypothyroidism  EXAM: CHEST  2 VIEW  COMPARISON:  March 15, 2013  FINDINGS: There are scattered calcified granulomas. There is scarring in the right lower lobe region as well as in each apical region. There is no edema or consolidation. The heart size and pulmonary vascularity are normal. There is a small hiatal hernia. No adenopathy. There is degenerative change at the thoracolumbar junction with lower thoracic levoscoliosis.  IMPRESSION: Areas of scarring, stable. Scattered calcified granulomas. No edema or consolidation. Small hiatal hernia.   Electronically Signed    By: Lowella Grip III M.D.   On: 03/02/2015 12:29   Ct Head Wo Contrast  03/02/2015   CLINICAL DATA:  81 year old female with dizziness.  EXAM: CT HEAD WITHOUT CONTRAST  TECHNIQUE: Contiguous axial images were obtained from the base of the skull through the vertex without intravenous contrast.  COMPARISON:  No priors.  FINDINGS: Well-defined focus of low attenuation in the posterior aspect of the head of the right caudate nucleus extending into the overlying white matter, compatible with an old lacunar infarct. Patchy and confluent areas of decreased attenuation are  noted throughout the deep and periventricular white matter of the cerebral hemispheres bilaterally, compatible with chronic microvascular ischemic disease. Mild cerebral atrophy, age appropriate. Numerous atherosclerotic calcifications in the visualize cerebral vasculature. No acute intracranial abnormalities. Specifically, no evidence of acute intracranial hemorrhage, no definite findings of acute/subacute cerebral ischemia, no mass, mass effect, hydrocephalus or abnormal intra or extra-axial fluid collections. Visualized paranasal sinuses and mastoids are well pneumatized, with exception of a small left mastoid effusion. No acute displaced skull fractures are identified.  IMPRESSION: 1. No acute intracranial abnormalities. 2. Small left mastoid effusion. 3. Mild cerebral atrophy with chronic microvascular ischemic changes in the cerebral white matter and old right-sided lacunar infarct, as above. 4. Atherosclerosis.   Electronically Signed   By: Vinnie Langton M.D.   On: 03/02/2015 12:13   Mr Brain Wo Contrast  03/03/2015   CLINICAL DATA:  Sudden onset of dizziness. Frontal headache. Vertigo.  EXAM: MRI HEAD WITHOUT CONTRAST  TECHNIQUE: Multiplanar, multiecho pulse sequences of the brain and surrounding structures were obtained without intravenous contrast.  COMPARISON:  CT head without contrast from the same day.  FINDINGS: An acute  nonhemorrhagic infarct is present within the superior right cerebellum measuring 10 x 18 mm. This infarct. Remote on the CT scan. T2 and FLAIR hyperintensity is associated.  Moderate atrophy and periventricular white matter changes are noted otherwise. There is a remote lacunar infarct within the right centrum semi ovale and another and the more anterior right coronal radiata. No significant extraaxial fluid collection is present.  Flow is present in the major intracranial arteries. The patient is status post bilateral lens replacements. The paranasal sinuses and scratch the the paranasal sinuses are clear. There is some fluid in the mastoid air cells bilaterally. No obstructing nasopharyngeal lesion is evident.  IMPRESSION: 1. Acute nonhemorrhagic infarct within the right superior cerebellum corresponds with the patient's acute vertigo and dizziness. 2. Moderate atrophy and white matter changes otherwise likely reflect expected changes of chronic microvascular ischemia.   Electronically Signed   By: San Morelle M.D.   On: 03/03/2015 10:35    Microbiology: No results found for this or any previous visit (from the past 240 hour(s)).   Labs: Basic Metabolic Panel:  Recent Labs Lab 03/02/15 1030 03/03/15 0237 03/05/15 0601 03/07/15 0621 03/08/15 0549  NA 140 138 142 141 140  K 5.4* 4.8 3.9 4.0 4.2  CL 109 112 113* 112 109  CO2 '27 21 22 25 24  ' GLUCOSE 238* 91 171* 192* 188*  BUN 37* 39* 34* 28* 27*  CREATININE 1.41* 1.37* 1.45* 1.64* 1.43*  CALCIUM 8.7 8.1* 8.6 8.5 8.8   Liver Function Tests:  Recent Labs Lab 03/02/15 1030  AST 21  ALT 14  ALKPHOS 63  BILITOT 0.3  PROT 5.7*  ALBUMIN 3.3*   No results for input(s): LIPASE, AMYLASE in the last 168 hours. No results for input(s): AMMONIA in the last 168 hours. CBC:  Recent Labs Lab 03/02/15 1030  03/03/15 0237 03/03/15 1050 03/04/15 0454 03/05/15 0601 03/07/15 0621 03/08/15 0549  WBC 10.9*  --  8.6  --   --  8.3  7.9 7.8  HGB 10.1*  < > 6.8* 8.7* 8.3* 8.5* 8.5* 8.7*  HCT 31.1*  < > 20.1* 25.8* 25.2* 25.6* 26.3* 26.7*  MCV 95.1  --  92.2  --   --  91.1 93.6 95.0  PLT 145*  --  111*  --   --  125* 160 166  < > = values  in this interval not displayed. Cardiac Enzymes:  Recent Labs Lab 03/02/15 1837 03/02/15 2359 03/03/15 0237  TROPONINI <0.03 <0.03 0.03   BNP: BNP (last 3 results) No results for input(s): BNP in the last 8760 hours.  ProBNP (last 3 results) No results for input(s): PROBNP in the last 8760 hours.  CBG:  Recent Labs Lab 03/07/15 1647 03/07/15 1956 03/07/15 2353 03/08/15 0357 03/08/15 0729  GLUCAP 147* 248* 99 154* 130*       Signed:  Allon Costlow  [facility] 03/08/2015, 11:16 AM

## 2015-03-08 NOTE — Care Management Note (Signed)
CARE MANAGEMENT NOTE 03/08/2015  Patient:  [name], [name]   Account Number:  [id number]  Date Initiated:  03/06/2015  Documentation initiated by:  Maryland Luppino  Subjective/Objective Assessment:   CM following for progression and d/c planning.     Action/Plan:   Met with pt and family member, plan is to return to home is interested in CIR. Pt was driving herself prior to admission.  03/08/15 Plan to d/c today to short term SNF.   Anticipated DC Date:  03/08/2015   Anticipated DC Plan:  SKILLED NURSING FACILITY         Choice offered to / List presented to:             Status of service:  Completed, signed off Medicare Important Message given?  YES (If response is "NO", the following Medicare IM given date fields will be blank) Date Medicare IM given:  03/06/2015 Medicare IM given by:  Deissy Guilbert Date Additional Medicare IM given:   Additional Medicare IM given by:    Discharge Disposition:  [facility]  Per UR Regulation:    If discussed at Long Length of Stay Meetings, dates discussed:    Comments:

## 2015-03-09 ENCOUNTER — Encounter: Payer: Self-pay | Admitting: Internal Medicine

## 2015-03-09 ENCOUNTER — Non-Acute Institutional Stay: Payer: Commercial Managed Care - HMO | Admitting: Adult Health

## 2015-03-09 DIAGNOSIS — K250 Acute gastric ulcer with hemorrhage: Secondary | ICD-10-CM

## 2015-03-09 DIAGNOSIS — I639 Cerebral infarction, unspecified: Secondary | ICD-10-CM

## 2015-03-09 DIAGNOSIS — R42 Dizziness and giddiness: Secondary | ICD-10-CM

## 2015-03-09 DIAGNOSIS — E039 Hypothyroidism, unspecified: Secondary | ICD-10-CM

## 2015-03-09 DIAGNOSIS — I6350 Cerebral infarction due to unspecified occlusion or stenosis of unspecified cerebral artery: Secondary | ICD-10-CM

## 2015-03-09 DIAGNOSIS — N183 Chronic kidney disease, stage 3 unspecified: Secondary | ICD-10-CM

## 2015-03-09 DIAGNOSIS — D62 Acute posthemorrhagic anemia: Secondary | ICD-10-CM

## 2015-03-09 DIAGNOSIS — K5900 Constipation, unspecified: Secondary | ICD-10-CM | POA: Diagnosis not present

## 2015-03-09 DIAGNOSIS — E1142 Type 2 diabetes mellitus with diabetic polyneuropathy: Secondary | ICD-10-CM

## 2015-03-10 ENCOUNTER — Encounter: Payer: Self-pay | Admitting: Adult Health

## 2015-03-10 ENCOUNTER — Non-Acute Institutional Stay (SKILLED_NURSING_FACILITY): Payer: Commercial Managed Care - HMO | Admitting: Internal Medicine

## 2015-03-10 DIAGNOSIS — E039 Hypothyroidism, unspecified: Secondary | ICD-10-CM | POA: Diagnosis not present

## 2015-03-10 DIAGNOSIS — N183 Chronic kidney disease, stage 3 unspecified: Secondary | ICD-10-CM

## 2015-03-10 DIAGNOSIS — E1140 Type 2 diabetes mellitus with diabetic neuropathy, unspecified: Secondary | ICD-10-CM | POA: Diagnosis not present

## 2015-03-10 DIAGNOSIS — I6350 Cerebral infarction due to unspecified occlusion or stenosis of unspecified cerebral artery: Secondary | ICD-10-CM

## 2015-03-10 DIAGNOSIS — R5381 Other malaise: Secondary | ICD-10-CM | POA: Diagnosis not present

## 2015-03-10 DIAGNOSIS — K250 Acute gastric ulcer with hemorrhage: Secondary | ICD-10-CM | POA: Diagnosis not present

## 2015-03-10 DIAGNOSIS — I639 Cerebral infarction, unspecified: Secondary | ICD-10-CM

## 2015-03-10 DIAGNOSIS — E1142 Type 2 diabetes mellitus with diabetic polyneuropathy: Secondary | ICD-10-CM | POA: Diagnosis not present

## 2015-03-10 DIAGNOSIS — D62 Acute posthemorrhagic anemia: Secondary | ICD-10-CM

## 2015-03-10 NOTE — Progress Notes (Signed)
Patient ID: [name], female   DOB: 17-Mar-1915, 81 y.o.   MRN: [id number]   03/09/15  Facility:  [facility] Home Location:  [facility] Room Number: 1005-2 LEVEL OF CARE:  SNF (31)   Chief Complaint  Patient presents with  . Hospitalization Follow-up    Acute gastric ulcer S/P clip, acute ischemic stroke, hypothyroidism, diabetes mellitus, neuropathy, chronic kidney disease stage III, anemia, constipation and hyperlipidemia    HISTORY OF PRESENT ILLNESS:  This is a 81 year old female who has been admitted to [facility] on 03/08/15 from [facility]. She has PMH of hypothyroidism, diabetes mellitus type 2, diabetic neuropathy and chronic kidney disease stage III. She woke up with dizziness and frontal headache. Head CT did not show any new infarcts however, MRI shows acute right cerebellar CVA.  She has been admitted for a short-term rehabilitation.  PAST MEDICAL HISTORY:  Past Medical History  Diagnosis Date  . Hypothyroidism   . Type II diabetes mellitus   . Tuberculosis 1938    During the Great Depression  . Migraines     "a long long time ago"  . Arthritis     "a little; in my joints" (03/02/2015)    CURRENT MEDICATIONS: Reviewed per MAR/see medication list  Allergies  Allergen Reactions  . Other     Ground red pepper lotion to rub on her knee caused SOB     REVIEW OF SYSTEMS:  GENERAL: no change in appetite, no fatigue, no weight changes, no fever, chills or weakness RESPIRATORY: no cough, SOB, DOE, wheezing, hemoptysis CARDIAC: no chest pain, edema or palpitations GI: no abdominal pain, diarrhea, heart burn, nausea or vomiting, +constipation  PHYSICAL EXAMINATION  GENERAL: no acute distress, normal body habitus EYES: conjunctivae normal, sclerae normal, normal eye lids NECK: supple, trachea midline, no neck masses, no thyroid tenderness, no thyromegaly LYMPHATICS: no LAN in the neck, no supraclavicular LAN RESPIRATORY:  breathing is even & unlabored, BS CTAB CARDIAC: RRR, no murmur,no extra heart sounds, no edema GI: abdomen soft, normal BS, no masses, no tenderness, no hepatomegaly, no splenomegaly EXTREMITIES: Able to move 4 extremities PSYCHIATRIC: the patient is alert & oriented to person, affect & behavior appropriate  LABS/RADIOLOGY: Labs reviewed: Basic Metabolic Panel:  Recent Labs  03/05/15 0601 03/07/15 0621 03/08/15 0549  NA 142 141 140  K 3.9 4.0 4.2  CL 113* 112 109  CO2 22 25 24   GLUCOSE 171* 192* 188*  BUN 34* 28* 27*  CREATININE 1.45* 1.64* 1.43*  CALCIUM 8.6 8.5 8.8   Liver Function Tests:  Recent Labs  06/20/14 1412 12/12/14 0929 03/02/15 1030  AST 28 21 21   ALT 14 14 14   ALKPHOS 61 60 63  BILITOT 0.3 0.7 0.3  PROT 6.4 6.3 5.7*  ALBUMIN 3.7 3.2* 3.3*   CBC:  Recent Labs  03/05/15 0601 03/07/15 0621 03/08/15 0549  WBC 8.3 7.9 7.8  HGB 8.5* 8.5* 8.7*  HCT 25.6* 26.3* 26.7*  MCV 91.1 93.6 95.0  PLT 125* 160 166   Lipid Panel:  Recent Labs  12/12/14 0929 03/04/15 0454  HDL 51.90 40   Cardiac Enzymes:  Recent Labs  03/02/15 1837 03/02/15 2359 03/03/15 0237  TROPONINI <0.03 <0.03 0.03   CBG:  Recent Labs  03/08/15 0357 03/08/15 0729 03/08/15 1118  GLUCAP 154* 130* 173*    Dg Chest 2 View  03/02/2015   CLINICAL DATA:  Dizziness and hypothyroidism  EXAM: CHEST  2 VIEW  COMPARISON:  March 15, 2013  FINDINGS: There are scattered calcified granulomas. There is scarring in the right lower lobe region as well as in each apical region. There is no edema or consolidation. The heart size and pulmonary vascularity are normal. There is a small hiatal hernia. No adenopathy. There is degenerative change at the thoracolumbar junction with lower thoracic levoscoliosis.  IMPRESSION: Areas of scarring, stable. Scattered calcified granulomas. No edema or consolidation. Small hiatal hernia.   Electronically Signed   By: Lowella Grip III M.D.   On:  03/02/2015 12:29   Ct Head Wo Contrast  03/02/2015   CLINICAL DATA:  81 year old female with dizziness.  EXAM: CT HEAD WITHOUT CONTRAST  TECHNIQUE: Contiguous axial images were obtained from the base of the skull through the vertex without intravenous contrast.  COMPARISON:  No priors.  FINDINGS: Well-defined focus of low attenuation in the posterior aspect of the head of the right caudate nucleus extending into the overlying white matter, compatible with an old lacunar infarct. Patchy and confluent areas of decreased attenuation are noted throughout the deep and periventricular white matter of the cerebral hemispheres bilaterally, compatible with chronic microvascular ischemic disease. Mild cerebral atrophy, age appropriate. Numerous atherosclerotic calcifications in the visualize cerebral vasculature. No acute intracranial abnormalities. Specifically, no evidence of acute intracranial hemorrhage, no definite findings of acute/subacute cerebral ischemia, no mass, mass effect, hydrocephalus or abnormal intra or extra-axial fluid collections. Visualized paranasal sinuses and mastoids are well pneumatized, with exception of a small left mastoid effusion. No acute displaced skull fractures are identified.  IMPRESSION: 1. No acute intracranial abnormalities. 2. Small left mastoid effusion. 3. Mild cerebral atrophy with chronic microvascular ischemic changes in the cerebral white matter and old right-sided lacunar infarct, as above. 4. Atherosclerosis.   Electronically Signed   By: Vinnie Langton M.D.   On: 03/02/2015 12:13   Mr Brain Wo Contrast  03/03/2015   CLINICAL DATA:  Sudden onset of dizziness. Frontal headache. Vertigo.  EXAM: MRI HEAD WITHOUT CONTRAST  TECHNIQUE: Multiplanar, multiecho pulse sequences of the brain and surrounding structures were obtained without intravenous contrast.  COMPARISON:  CT head without contrast from the same day.  FINDINGS: An acute nonhemorrhagic infarct is present within the  superior right cerebellum measuring 10 x 18 mm. This infarct. Remote on the CT scan. T2 and FLAIR hyperintensity is associated.  Moderate atrophy and periventricular white matter changes are noted otherwise. There is a remote lacunar infarct within the right centrum semi ovale and another and the more anterior right coronal radiata. No significant extraaxial fluid collection is present.  Flow is present in the major intracranial arteries. The patient is status post bilateral lens replacements. The paranasal sinuses and scratch the the paranasal sinuses are clear. There is some fluid in the mastoid air cells bilaterally. No obstructing nasopharyngeal lesion is evident.  IMPRESSION: 1. Acute nonhemorrhagic infarct within the right superior cerebellum corresponds with the patient's acute vertigo and dizziness. 2. Moderate atrophy and white matter changes otherwise likely reflect expected changes of chronic microvascular ischemia.   Electronically Signed   By: San Morelle M.D.   On: 03/03/2015 10:35    ASSESSMENT/PLAN:  Physical deconditioning - for rehabilitation Acute gastric ulcer S/P clip - continue Protonix 40 mg by mouth twice a day 1 month, avoid NSAIDs Acute ischemic stroke - no antiplatelets due to GIB Hypothyroidism - continue Synthroid 88 g by mouth daily Diabetes mellitus, type II with neuropathy - hemoglobin A1c 8.5; continue Humalog 5 units subcutaneous for CBG >  200 and 6 units SQ for CBG > 300 and glimepiride 2 mg 1 tab by mouth every morning and 1 mg by mouth every afternoon Neuropathy - continue Lyrica 75 mg 1 capsule by mouth twice a day CK D stage III - creatinine 1.43 Vertigo - improved Anemia, acute blood loss - status post transfusion of 2 units packed RBC; hemoglobin 8.7; stable Constipation - start Senokot S2 tabs by mouth twice a day and continue psyllium 8.6% all other mixed in 8 ounces liquid by mouth daily    Goals of care:  Short-term  rehabilitation    Labs/test ordered:  CBC on 03/15/15 and stool occult blood X 1   Spent 50 minutes in patient care.   [facility], NP [employer] [phone]

## 2015-03-11 NOTE — Progress Notes (Signed)
Patient ID: [name], female   DOB: 01-26-1915, 81 y.o.   MRN: [id number]     [facility]   PCP: [employer] MD  Code Status: full code  Allergies  Allergen Reactions  . Other     Ground red pepper lotion to rub on her knee caused SOB    Chief Complaint  Patient presents with  . New Admit To SNF     HPI:  81 year old patient is here for short term rehabilitation post hospital admission from 03/02/15-03/08/15 with dizziness. She was noted to be orthostatic. Ct head was negative for any new infarcts. MRI brain showed acute ischemic stroke. She also had rectal bleed. Gi was consulted and she underwent EGD which showed acute gastric ulcer with bleeding. She underwent clipping, was transfused 2 u prbc and was started on PPI. Antiplatelets were not started due to her gi bleed. She has pmh of Hypothyroidism, dm type 2, ckd stage 3 among others. She noticed black colored stool in the hospital..she complaints of being constipated. Denies rectal bleed. Has easy fatigue but feels that her strength is improving. Her dizziness has improved.  Review of Systems:  Constitutional: Negative for fever, chills and diaphoresis.  HENT: Negative for headache, congestion Eyes: Negative for eye pain, blurred vision, double vision and discharge.  Respiratory: Negative for cough, shortness of breath and wheezing.   Cardiovascular: Negative for chest pain, palpitations, leg swelling.  Gastrointestinal: Negative for heartburn, nausea, vomiting, abdominal pain Genitourinary: Negative for dysuria Musculoskeletal: Negative for back pain, falls Skin: Negative for itching, rash.  Neurological: Negative for tingling, focal weakness Psychiatric/Behavioral: Negative for depression   Past Medical History  Diagnosis Date  . Hypothyroidism   . Type II diabetes mellitus   . Tuberculosis 1938    During the Great Depression  . Migraines     "a long long time ago"  .  Arthritis     "a little; in my joints" (03/02/2015)   Past Surgical History  Procedure Laterality Date  . Hip fracture surgery Bilateral   . Fracture surgery    . Tonsillectomy    . Cataract extraction w/ intraocular lens  implant, bilateral Bilateral   . Esophagogastroduodenoscopy N/A 03/03/2015    Procedure: ESOPHAGOGASTRODUODENOSCOPY (EGD);  Surgeon: Inda Castle, MD;  Location: [place];  Service: Endoscopy;  Laterality: N/A;  . Esophagogastroduodenoscopy N/A 03/07/2015    Procedure: ESOPHAGOGASTRODUODENOSCOPY (EGD);  Surgeon: Jerene Bears, MD;  Location: [facility];  Service: Endoscopy;  Laterality: N/A;   Social History:   reports that she has never smoked. She has never used smokeless tobacco. She reports that she drinks alcohol. She reports that she does not use illicit drugs.  Family History  Problem Relation Age of Onset  . Diabetes Father     Medications: Patient's Medications  New Prescriptions   No medications on file  Previous Medications   ACCU-CHEK FASTCLIX LANCETS MISC       CALCIUM CARBONATE-VITAMIN D (CALTRATE 600+D) 600-400 MG-UNIT PER TABLET    Take 1 tablet by mouth daily.   GLIMEPIRIDE (AMARYL) 2 MG TABLET    TAKE 1 TABLET BY MOUTH IN THE MORNING AND 1/2 TABLET AT DINNER   GLUCOSE BLOOD (ACCU-CHEK AVIVA PLUS) TEST STRIP    Use to check blood sugar three times  a day   INSULIN ASPART (NOVOLOG FLEXPEN) 100 UNIT/ML FLEXPEN    If blood sugar is over 200, take 5 units and if they go over 300,  take 6 units at that meal.   INSULIN PEN NEEDLE (NOVOFINE) 32G X 6 MM MISC    Use as directed   LANCETS MISC. (ACCU-CHEK FASTCLIX LANCET) KIT    USE AS DIRECTED FOR TESTING   LEVOTHYROXINE (SYNTHROID, LEVOTHROID) 88 MCG TABLET    Take 1 tablet daily   LYRICA 75 MG CAPSULE    TAKE 1 CAPSULE TWICE DAILY   PANTOPRAZOLE (PROTONIX) 40 MG TABLET    Take 1 tablet (40 mg total) by mouth 2 (two) times daily.   PRAVASTATIN (PRAVACHOL) 20 MG TABLET       PSYLLIUM (METAMUCIL) 58.6  % POWDER    Take 1 packet by mouth as directed.   TRAMADOL (ULTRAM) 50 MG TABLET    Take 1 tablet (50 mg total) by mouth 2 (two) times daily.  Modified Medications   No medications on file  Discontinued Medications   No medications on file     Physical Exam: Filed Vitals:   03/10/15 1845  BP: 143/60  Pulse: 59  Temp: 98.3 F (36.8 C)  Resp: 18  Weight: 133 lb 12.8 oz (60.691 kg)  SpO2: 97%    General- elderly female, in no acute distress Head- normocephalic, atraumatic Throat- moist mucus membrane Neck- no cervical lymphadenopathy Cardiovascular- normal s1,s2, no murmurs, palpable dorsalis pedis and radial pulses, no leg edema Respiratory- bilateral clear to auscultation, no wheeze, no rhonchi, no crackles, no use of accessory muscles Abdomen- bowel sounds present, soft, non tender Musculoskeletal- able to move all 4 extremities, normal range of motion, generalized weakness  Neurological- no focal deficit Skin- warm and dry Psychiatry- alert and oriented to person, place and time, normal mood and affect    Labs reviewed: Basic Metabolic Panel:  Recent Labs  03/05/15 0601 03/07/15 0621 03/08/15 0549  NA 142 141 140  K 3.9 4.0 4.2  CL 113* 112 109  CO2 '22 25 24  ' GLUCOSE 171* 192* 188*  BUN 34* 28* 27*  CREATININE 1.45* 1.64* 1.43*  CALCIUM 8.6 8.5 8.8   Liver Function Tests:  Recent Labs  06/20/14 1412 12/12/14 0929 03/02/15 1030  AST '28 21 21  ' ALT '14 14 14  ' ALKPHOS 61 60 63  BILITOT 0.3 0.7 0.3  PROT 6.4 6.3 5.7*  ALBUMIN 3.7 3.2* 3.3*   No results for input(s): LIPASE, AMYLASE in the last 8760 hours. No results for input(s): AMMONIA in the last 8760 hours. CBC:  Recent Labs  03/05/15 0601 03/07/15 0621 03/08/15 0549  WBC 8.3 7.9 7.8  HGB 8.5* 8.5* 8.7*  HCT 25.6* 26.3* 26.7*  MCV 91.1 93.6 95.0  PLT 125* 160 166   Cardiac Enzymes:  Recent Labs  03/02/15 1837 03/02/15 2359 03/03/15 0237  TROPONINI <0.03 <0.03 0.03    BNP: Invalid input(s): POCBNP CBG:  Recent Labs  03/08/15 0357 03/08/15 0729 03/08/15 1118  GLUCAP 154* 130* 173*     Assessment/Plan  Physical deconditioning Will have her work with physical therapy and occupational therapy team to help with gait training and muscle strengthening exercises.fall precautions. Skin care. Encourage to be out of bed.   Acute gastric ulcer  S/P hemostatic clip. continue Protonix 40 mg bid1 month, avoid NSAIDs, monitor h&h. Pending biopsy result  Acute ischemic stroke  Stable bp, off antiplatelet with gi bleed  Anemia From gi bleed, s/p 2 u transfusion. Monitor h&h  Diabetes mellitus type II with neuropathy hemoglobin A1c 8.5. continue Humalog and glimepiride 2 mg, monitor cbg  Constipation Continue Senokot S 2  tabs bid, add miralax daily and reassess   Neuropathic pain continue Lyrica 75 mg bid and ultram 100 mg bid  CKD stage III  Monitor clinically, avoid NSAIDs  Hypothyroidism continue Synthroid 88 mcg daily    Goals of care:  Short-term rehabilitation   Labs/tests ordered: cbc, cmp 03/13/15  Family/ staff Communication: reviewed care plan with patient and nursing supervisor    Blanchie Serve, MD  [place] [phone] (Monday-Friday 8 am - 5 pm) [phone] (afterhours)

## 2015-03-13 ENCOUNTER — Other Ambulatory Visit: Payer: Commercial Managed Care - HMO

## 2015-03-18 ENCOUNTER — Other Ambulatory Visit: Payer: Self-pay | Admitting: Endocrinology

## 2015-03-23 ENCOUNTER — Non-Acute Institutional Stay (SKILLED_NURSING_FACILITY): Payer: Commercial Managed Care - HMO | Admitting: Adult Health

## 2015-03-23 ENCOUNTER — Encounter: Payer: Self-pay | Admitting: Adult Health

## 2015-03-23 DIAGNOSIS — E1142 Type 2 diabetes mellitus with diabetic polyneuropathy: Secondary | ICD-10-CM

## 2015-03-23 DIAGNOSIS — K250 Acute gastric ulcer with hemorrhage: Secondary | ICD-10-CM

## 2015-03-23 DIAGNOSIS — I6350 Cerebral infarction due to unspecified occlusion or stenosis of unspecified cerebral artery: Secondary | ICD-10-CM | POA: Diagnosis not present

## 2015-03-23 DIAGNOSIS — D62 Acute posthemorrhagic anemia: Secondary | ICD-10-CM | POA: Diagnosis not present

## 2015-03-23 DIAGNOSIS — E1140 Type 2 diabetes mellitus with diabetic neuropathy, unspecified: Secondary | ICD-10-CM | POA: Diagnosis not present

## 2015-03-23 DIAGNOSIS — K5900 Constipation, unspecified: Secondary | ICD-10-CM | POA: Diagnosis not present

## 2015-03-23 DIAGNOSIS — E43 Unspecified severe protein-calorie malnutrition: Secondary | ICD-10-CM | POA: Diagnosis not present

## 2015-03-23 DIAGNOSIS — E039 Hypothyroidism, unspecified: Secondary | ICD-10-CM

## 2015-03-23 DIAGNOSIS — R5381 Other malaise: Secondary | ICD-10-CM | POA: Diagnosis not present

## 2015-03-23 DIAGNOSIS — I639 Cerebral infarction, unspecified: Secondary | ICD-10-CM

## 2015-03-23 NOTE — Progress Notes (Signed)
Patient ID: [name], female   DOB: 02-25-1915, 81 y.o.   MRN: [id number]   03/23/15  Facility:  [facility] Location:  [place] Room Number: 1005-2 LEVEL OF CARE:  SNF (31)   Chief Complaint  Patient presents with  . Discharge Note    Physical deconditioning, acute gastric ulcer, acute ischemic stroke, protein calorie malnutrition, hypothyroidism, diabetes mellitus, neuropathy, anemia and constipation    HISTORY OF PRESENT ILLNESS:  This is a 81 year old female who is for discharge home with Home health PT for safety/ambulation, OT self care skills/home management and Nursing for disease management. She has been admitted to [facility] on 03/08/15 from [facility]. She has PMH of hypothyroidism, diabetes mellitus type 2, diabetic neuropathy and chronic kidney disease stage III. She woke up with dizziness and frontal headache. Head CT did not show any new infarcts however, MRI shows acute right cerebellar CVA.  Patient was admitted to this facility for short-term rehabilitation after the patient's recent hospitalization.  Patient has completed SNF rehabilitation and therapy has cleared the patient for discharge.  PAST MEDICAL HISTORY:  Past Medical History  Diagnosis Date  . Hypothyroidism   . Type II diabetes mellitus   . Tuberculosis 1938    During the Great Depression  . Migraines     "a long long time ago"  . Arthritis     "a little; in my joints" (03/02/2015)    CURRENT MEDICATIONS: Reviewed per MAR/see medication list  Allergies  Allergen Reactions  . Other     Ground red pepper lotion to rub on her knee caused SOB     REVIEW OF SYSTEMS:  GENERAL: no change in appetite, no fatigue, no weight changes, no fever, chills or weakness RESPIRATORY: no cough, SOB, DOE, wheezing, hemoptysis CARDIAC: no chest pain, edema or palpitations GI: no abdominal pain, diarrhea, heart burn, nausea or vomiting, +constipation  PHYSICAL  EXAMINATION  GENERAL: no acute distress, normal body habitus NECK: supple, trachea midline, no neck masses, no thyroid tenderness, no thyromegaly LYMPHATICS: no LAN in the neck, no supraclavicular LAN RESPIRATORY: breathing is even & unlabored, BS CTAB CARDIAC: RRR, no murmur,no extra heart sounds, no edema GI: abdomen soft, normal BS, no masses, no tenderness, no hepatomegaly, no splenomegaly EXTREMITIES: Able to move 4 extremities PSYCHIATRIC: the patient is alert & oriented to person, affect & behavior appropriate  LABS/RADIOLOGY: 03/21/15  WBC 7.1 hemoglobin 8.4 hematocrit 26.9 MCV 92.1 03/13/15  WBC 7.3 hemoglobin 7.9 hematocrit 25.0 MCV 92.6 sodium 139 potassium 4.8 glucose 108 BUN 24 creatinine 1.59 total bilirubin 0.3 alkaline phosphatase 51 SGOT 5015 SGPT 9 total protein 4.7 calcium 8.4 albumin 2.7 Labs reviewed: Basic Metabolic Panel:  Recent Labs  03/05/15 0601 03/07/15 0621 03/08/15 0549  NA 142 141 140  K 3.9 4.0 4.2  CL 113* 112 109  CO2 22 25 24   GLUCOSE 171* 192* 188*  BUN 34* 28* 27*  CREATININE 1.45* 1.64* 1.43*  CALCIUM 8.6 8.5 8.8   Liver Function Tests:  Recent Labs  06/20/14 1412 12/12/14 0929 03/02/15 1030  AST 28 21 21   ALT 14 14 14   ALKPHOS 61 60 63  BILITOT 0.3 0.7 0.3  PROT 6.4 6.3 5.7*  ALBUMIN 3.7 3.2* 3.3*   CBC:  Recent Labs  03/05/15 0601 03/07/15 0621 03/08/15 0549  WBC 8.3 7.9 7.8  HGB 8.5* 8.5* 8.7*  HCT 25.6* 26.3* 26.7*  MCV 91.1 93.6 95.0  PLT 125* 160 166  Lipid Panel:  Recent Labs  12/12/14 0929 03/04/15 0454  HDL 51.90 40   Cardiac Enzymes:  Recent Labs  03/02/15 1837 03/02/15 2359 03/03/15 0237  TROPONINI <0.03 <0.03 0.03   CBG:  Recent Labs  03/08/15 0357 03/08/15 0729 03/08/15 1118  GLUCAP 154* 130* 173*    Dg Chest 2 View  03/02/2015   CLINICAL DATA:  Dizziness and hypothyroidism  EXAM: CHEST  2 VIEW  COMPARISON:  March 15, 2013  FINDINGS: There are scattered calcified granulomas. There  is scarring in the right lower lobe region as well as in each apical region. There is no edema or consolidation. The heart size and pulmonary vascularity are normal. There is a small hiatal hernia. No adenopathy. There is degenerative change at the thoracolumbar junction with lower thoracic levoscoliosis.  IMPRESSION: Areas of scarring, stable. Scattered calcified granulomas. No edema or consolidation. Small hiatal hernia.   Electronically Signed   By: Lowella Grip III M.D.   On: 03/02/2015 12:29   Ct Head Wo Contrast  03/02/2015   CLINICAL DATA:  81 year old female with dizziness.  EXAM: CT HEAD WITHOUT CONTRAST  TECHNIQUE: Contiguous axial images were obtained from the base of the skull through the vertex without intravenous contrast.  COMPARISON:  No priors.  FINDINGS: Well-defined focus of low attenuation in the posterior aspect of the head of the right caudate nucleus extending into the overlying white matter, compatible with an old lacunar infarct. Patchy and confluent areas of decreased attenuation are noted throughout the deep and periventricular white matter of the cerebral hemispheres bilaterally, compatible with chronic microvascular ischemic disease. Mild cerebral atrophy, age appropriate. Numerous atherosclerotic calcifications in the visualize cerebral vasculature. No acute intracranial abnormalities. Specifically, no evidence of acute intracranial hemorrhage, no definite findings of acute/subacute cerebral ischemia, no mass, mass effect, hydrocephalus or abnormal intra or extra-axial fluid collections. Visualized paranasal sinuses and mastoids are well pneumatized, with exception of a small left mastoid effusion. No acute displaced skull fractures are identified.  IMPRESSION: 1. No acute intracranial abnormalities. 2. Small left mastoid effusion. 3. Mild cerebral atrophy with chronic microvascular ischemic changes in the cerebral white matter and old right-sided lacunar infarct, as above. 4.  Atherosclerosis.   Electronically Signed   By: Vinnie Langton M.D.   On: 03/02/2015 12:13   Mr Brain Wo Contrast  03/03/2015   CLINICAL DATA:  Sudden onset of dizziness. Frontal headache. Vertigo.  EXAM: MRI HEAD WITHOUT CONTRAST  TECHNIQUE: Multiplanar, multiecho pulse sequences of the brain and surrounding structures were obtained without intravenous contrast.  COMPARISON:  CT head without contrast from the same day.  FINDINGS: An acute nonhemorrhagic infarct is present within the superior right cerebellum measuring 10 x 18 mm. This infarct. Remote on the CT scan. T2 and FLAIR hyperintensity is associated.  Moderate atrophy and periventricular white matter changes are noted otherwise. There is a remote lacunar infarct within the right centrum semi ovale and another and the more anterior right coronal radiata. No significant extraaxial fluid collection is present.  Flow is present in the major intracranial arteries. The patient is status post bilateral lens replacements. The paranasal sinuses and scratch the the paranasal sinuses are clear. There is some fluid in the mastoid air cells bilaterally. No obstructing nasopharyngeal lesion is evident.  IMPRESSION: 1. Acute nonhemorrhagic infarct within the right superior cerebellum corresponds with the patient's acute vertigo and dizziness. 2. Moderate atrophy and white matter changes otherwise likely reflect expected changes of chronic microvascular ischemia.   Electronically  Signed   By: San Morelle M.D.   On: 03/03/2015 10:35    ASSESSMENT/PLAN:  Physical deconditioning - for home health PT, OT and nursing Acute gastric ulcer S/P clip - continue Protonix 40 mg by mouth twice a day for a total of 1 month, avoid NSAIDs Acute ischemic stroke - no antiplatelets due to GIB Hypothyroidism - continue Synthroid 88 g by mouth daily Diabetes mellitus, type II with neuropathy - hemoglobin A1c 8.5; continue Humalog 5 units subcutaneous for CBG >200 and 6  units SQ for CBG > 300 and glimepiride 2 mg 1 tab by mouth every morning and 1 mg by mouth every afternoon Neuropathy - continue Lyrica 75 mg 1 capsule by mouth twice a day Anemia, acute blood loss - status post transfusion of 2 units packed RBC; hemoglobin 8.4; stable Constipation - continue Senokot S2 tabs by mouth twice a day and psyllium 8.6% all other mixed in 8 ounces liquid by mouth daily Protein calorie malnutrition, severe - albumin 2.7; continue supplementation     I have filled out patient's discharge paperwork and written prescriptions.  Patient will receive home health PT, OT and Nursing.  Total discharge time: Greate than 30 minutes  Discharge time involved coordination of the discharge process with Education officer, museum, nursing staff and therapy department. Medical justification for home health services verified.   [facility], NP [employer] [phone]

## 2015-03-25 DIAGNOSIS — M6281 Muscle weakness (generalized): Secondary | ICD-10-CM | POA: Diagnosis not present

## 2015-03-25 DIAGNOSIS — N189 Chronic kidney disease, unspecified: Secondary | ICD-10-CM | POA: Diagnosis not present

## 2015-03-25 DIAGNOSIS — D62 Acute posthemorrhagic anemia: Secondary | ICD-10-CM | POA: Diagnosis not present

## 2015-03-25 DIAGNOSIS — K250 Acute gastric ulcer with hemorrhage: Secondary | ICD-10-CM | POA: Diagnosis not present

## 2015-03-25 DIAGNOSIS — I69398 Other sequelae of cerebral infarction: Secondary | ICD-10-CM | POA: Diagnosis not present

## 2015-03-25 DIAGNOSIS — E1165 Type 2 diabetes mellitus with hyperglycemia: Secondary | ICD-10-CM | POA: Diagnosis not present

## 2015-03-25 DIAGNOSIS — Z794 Long term (current) use of insulin: Secondary | ICD-10-CM | POA: Diagnosis not present

## 2015-03-25 DIAGNOSIS — R269 Unspecified abnormalities of gait and mobility: Secondary | ICD-10-CM | POA: Diagnosis not present

## 2015-03-26 DIAGNOSIS — R269 Unspecified abnormalities of gait and mobility: Secondary | ICD-10-CM | POA: Diagnosis not present

## 2015-03-26 DIAGNOSIS — D62 Acute posthemorrhagic anemia: Secondary | ICD-10-CM | POA: Diagnosis not present

## 2015-03-26 DIAGNOSIS — E1165 Type 2 diabetes mellitus with hyperglycemia: Secondary | ICD-10-CM | POA: Diagnosis not present

## 2015-03-26 DIAGNOSIS — N189 Chronic kidney disease, unspecified: Secondary | ICD-10-CM | POA: Diagnosis not present

## 2015-03-26 DIAGNOSIS — I69398 Other sequelae of cerebral infarction: Secondary | ICD-10-CM | POA: Diagnosis not present

## 2015-03-26 DIAGNOSIS — K250 Acute gastric ulcer with hemorrhage: Secondary | ICD-10-CM | POA: Diagnosis not present

## 2015-03-26 DIAGNOSIS — M6281 Muscle weakness (generalized): Secondary | ICD-10-CM | POA: Diagnosis not present

## 2015-03-26 DIAGNOSIS — Z794 Long term (current) use of insulin: Secondary | ICD-10-CM | POA: Diagnosis not present

## 2015-03-28 DIAGNOSIS — E1165 Type 2 diabetes mellitus with hyperglycemia: Secondary | ICD-10-CM | POA: Diagnosis not present

## 2015-03-28 DIAGNOSIS — I69398 Other sequelae of cerebral infarction: Secondary | ICD-10-CM | POA: Diagnosis not present

## 2015-03-28 DIAGNOSIS — M6281 Muscle weakness (generalized): Secondary | ICD-10-CM | POA: Diagnosis not present

## 2015-03-28 DIAGNOSIS — K250 Acute gastric ulcer with hemorrhage: Secondary | ICD-10-CM | POA: Diagnosis not present

## 2015-03-28 DIAGNOSIS — Z794 Long term (current) use of insulin: Secondary | ICD-10-CM | POA: Diagnosis not present

## 2015-03-28 DIAGNOSIS — R269 Unspecified abnormalities of gait and mobility: Secondary | ICD-10-CM | POA: Diagnosis not present

## 2015-03-28 DIAGNOSIS — N189 Chronic kidney disease, unspecified: Secondary | ICD-10-CM | POA: Diagnosis not present

## 2015-03-28 DIAGNOSIS — D62 Acute posthemorrhagic anemia: Secondary | ICD-10-CM | POA: Diagnosis not present

## 2015-03-29 DIAGNOSIS — D62 Acute posthemorrhagic anemia: Secondary | ICD-10-CM | POA: Diagnosis not present

## 2015-03-29 DIAGNOSIS — M6281 Muscle weakness (generalized): Secondary | ICD-10-CM | POA: Diagnosis not present

## 2015-03-29 DIAGNOSIS — R269 Unspecified abnormalities of gait and mobility: Secondary | ICD-10-CM | POA: Diagnosis not present

## 2015-03-29 DIAGNOSIS — N189 Chronic kidney disease, unspecified: Secondary | ICD-10-CM | POA: Diagnosis not present

## 2015-03-29 DIAGNOSIS — K250 Acute gastric ulcer with hemorrhage: Secondary | ICD-10-CM | POA: Diagnosis not present

## 2015-03-29 DIAGNOSIS — E1165 Type 2 diabetes mellitus with hyperglycemia: Secondary | ICD-10-CM | POA: Diagnosis not present

## 2015-03-29 DIAGNOSIS — I69398 Other sequelae of cerebral infarction: Secondary | ICD-10-CM | POA: Diagnosis not present

## 2015-03-29 DIAGNOSIS — Z794 Long term (current) use of insulin: Secondary | ICD-10-CM | POA: Diagnosis not present

## 2015-03-30 DIAGNOSIS — Z794 Long term (current) use of insulin: Secondary | ICD-10-CM | POA: Diagnosis not present

## 2015-03-30 DIAGNOSIS — K250 Acute gastric ulcer with hemorrhage: Secondary | ICD-10-CM | POA: Diagnosis not present

## 2015-03-30 DIAGNOSIS — I69398 Other sequelae of cerebral infarction: Secondary | ICD-10-CM | POA: Diagnosis not present

## 2015-03-30 DIAGNOSIS — E1165 Type 2 diabetes mellitus with hyperglycemia: Secondary | ICD-10-CM | POA: Diagnosis not present

## 2015-03-30 DIAGNOSIS — M6281 Muscle weakness (generalized): Secondary | ICD-10-CM | POA: Diagnosis not present

## 2015-03-30 DIAGNOSIS — N189 Chronic kidney disease, unspecified: Secondary | ICD-10-CM | POA: Diagnosis not present

## 2015-03-30 DIAGNOSIS — R269 Unspecified abnormalities of gait and mobility: Secondary | ICD-10-CM | POA: Diagnosis not present

## 2015-03-30 DIAGNOSIS — D62 Acute posthemorrhagic anemia: Secondary | ICD-10-CM | POA: Diagnosis not present

## 2015-03-31 DIAGNOSIS — M6281 Muscle weakness (generalized): Secondary | ICD-10-CM | POA: Diagnosis not present

## 2015-03-31 DIAGNOSIS — K250 Acute gastric ulcer with hemorrhage: Secondary | ICD-10-CM | POA: Diagnosis not present

## 2015-03-31 DIAGNOSIS — Z794 Long term (current) use of insulin: Secondary | ICD-10-CM | POA: Diagnosis not present

## 2015-03-31 DIAGNOSIS — I69398 Other sequelae of cerebral infarction: Secondary | ICD-10-CM | POA: Diagnosis not present

## 2015-03-31 DIAGNOSIS — N189 Chronic kidney disease, unspecified: Secondary | ICD-10-CM | POA: Diagnosis not present

## 2015-03-31 DIAGNOSIS — R269 Unspecified abnormalities of gait and mobility: Secondary | ICD-10-CM | POA: Diagnosis not present

## 2015-03-31 DIAGNOSIS — E1165 Type 2 diabetes mellitus with hyperglycemia: Secondary | ICD-10-CM | POA: Diagnosis not present

## 2015-03-31 DIAGNOSIS — D62 Acute posthemorrhagic anemia: Secondary | ICD-10-CM | POA: Diagnosis not present

## 2015-04-03 DIAGNOSIS — E1165 Type 2 diabetes mellitus with hyperglycemia: Secondary | ICD-10-CM | POA: Diagnosis not present

## 2015-04-03 DIAGNOSIS — Z794 Long term (current) use of insulin: Secondary | ICD-10-CM | POA: Diagnosis not present

## 2015-04-03 DIAGNOSIS — D62 Acute posthemorrhagic anemia: Secondary | ICD-10-CM | POA: Diagnosis not present

## 2015-04-03 DIAGNOSIS — N189 Chronic kidney disease, unspecified: Secondary | ICD-10-CM | POA: Diagnosis not present

## 2015-04-03 DIAGNOSIS — R269 Unspecified abnormalities of gait and mobility: Secondary | ICD-10-CM | POA: Diagnosis not present

## 2015-04-03 DIAGNOSIS — I69398 Other sequelae of cerebral infarction: Secondary | ICD-10-CM | POA: Diagnosis not present

## 2015-04-03 DIAGNOSIS — M6281 Muscle weakness (generalized): Secondary | ICD-10-CM | POA: Diagnosis not present

## 2015-04-03 DIAGNOSIS — K250 Acute gastric ulcer with hemorrhage: Secondary | ICD-10-CM | POA: Diagnosis not present

## 2015-04-04 DIAGNOSIS — Z794 Long term (current) use of insulin: Secondary | ICD-10-CM | POA: Diagnosis not present

## 2015-04-04 DIAGNOSIS — R269 Unspecified abnormalities of gait and mobility: Secondary | ICD-10-CM | POA: Diagnosis not present

## 2015-04-04 DIAGNOSIS — I69398 Other sequelae of cerebral infarction: Secondary | ICD-10-CM | POA: Diagnosis not present

## 2015-04-04 DIAGNOSIS — K250 Acute gastric ulcer with hemorrhage: Secondary | ICD-10-CM | POA: Diagnosis not present

## 2015-04-04 DIAGNOSIS — E1165 Type 2 diabetes mellitus with hyperglycemia: Secondary | ICD-10-CM | POA: Diagnosis not present

## 2015-04-04 DIAGNOSIS — M6281 Muscle weakness (generalized): Secondary | ICD-10-CM | POA: Diagnosis not present

## 2015-04-04 DIAGNOSIS — N189 Chronic kidney disease, unspecified: Secondary | ICD-10-CM | POA: Diagnosis not present

## 2015-04-04 DIAGNOSIS — D62 Acute posthemorrhagic anemia: Secondary | ICD-10-CM | POA: Diagnosis not present

## 2015-04-06 ENCOUNTER — Telehealth: Payer: Self-pay | Admitting: Endocrinology

## 2015-04-06 ENCOUNTER — Encounter: Payer: Self-pay | Admitting: Endocrinology

## 2015-04-06 ENCOUNTER — Other Ambulatory Visit: Payer: Self-pay | Admitting: Endocrinology

## 2015-04-06 ENCOUNTER — Ambulatory Visit (INDEPENDENT_AMBULATORY_CARE_PROVIDER_SITE_OTHER): Payer: Commercial Managed Care - HMO | Admitting: Endocrinology

## 2015-04-06 VITALS — BP 130/55 | HR 84 | Temp 98.1°F | Wt 143.2 lb

## 2015-04-06 DIAGNOSIS — D62 Acute posthemorrhagic anemia: Secondary | ICD-10-CM

## 2015-04-06 DIAGNOSIS — E1165 Type 2 diabetes mellitus with hyperglycemia: Secondary | ICD-10-CM

## 2015-04-06 DIAGNOSIS — D649 Anemia, unspecified: Secondary | ICD-10-CM

## 2015-04-06 DIAGNOSIS — R600 Localized edema: Secondary | ICD-10-CM | POA: Diagnosis not present

## 2015-04-06 DIAGNOSIS — IMO0002 Reserved for concepts with insufficient information to code with codable children: Secondary | ICD-10-CM

## 2015-04-06 DIAGNOSIS — N189 Chronic kidney disease, unspecified: Secondary | ICD-10-CM | POA: Diagnosis not present

## 2015-04-06 DIAGNOSIS — M6281 Muscle weakness (generalized): Secondary | ICD-10-CM | POA: Diagnosis not present

## 2015-04-06 DIAGNOSIS — K250 Acute gastric ulcer with hemorrhage: Secondary | ICD-10-CM | POA: Diagnosis not present

## 2015-04-06 DIAGNOSIS — R269 Unspecified abnormalities of gait and mobility: Secondary | ICD-10-CM | POA: Diagnosis not present

## 2015-04-06 DIAGNOSIS — N183 Chronic kidney disease, stage 3 unspecified: Secondary | ICD-10-CM

## 2015-04-06 DIAGNOSIS — R413 Other amnesia: Secondary | ICD-10-CM

## 2015-04-06 DIAGNOSIS — I69398 Other sequelae of cerebral infarction: Secondary | ICD-10-CM | POA: Diagnosis not present

## 2015-04-06 DIAGNOSIS — E039 Hypothyroidism, unspecified: Secondary | ICD-10-CM

## 2015-04-06 DIAGNOSIS — Z794 Long term (current) use of insulin: Secondary | ICD-10-CM | POA: Diagnosis not present

## 2015-04-06 LAB — COMPREHENSIVE METABOLIC PANEL
ALK PHOS: 56 U/L (ref 39–117)
ALT: 11 U/L (ref 0–35)
AST: 17 U/L (ref 0–37)
Albumin: 3.4 g/dL — ABNORMAL LOW (ref 3.5–5.2)
BUN: 27 mg/dL — AB (ref 6–23)
CO2: 24 mEq/L (ref 19–32)
Calcium: 8.8 mg/dL (ref 8.4–10.5)
Chloride: 105 mEq/L (ref 96–112)
Creatinine, Ser: 1.46 mg/dL — ABNORMAL HIGH (ref 0.40–1.20)
GFR: 35.19 mL/min — ABNORMAL LOW (ref >60.00)
Glucose, Bld: 195 mg/dL — ABNORMAL HIGH (ref 70–99)
Potassium: 4.8 mEq/L (ref 3.5–5.1)
SODIUM: 135 meq/L (ref 135–145)
Total Bilirubin: 0.3 mg/dL (ref 0.2–1.2)
Total Protein: 6.1 g/dL (ref 6.0–8.3)

## 2015-04-06 LAB — CBC WITH DIFFERENTIAL/PLATELET
Basophils Absolute: 0 10*3/uL (ref 0.0–0.1)
Basophils Relative: 0.6 % (ref 0.0–3.0)
EOS ABS: 0.1 10*3/uL (ref 0.0–0.7)
Eosinophils Relative: 1 % (ref 0.0–5.0)
HCT: 24.1 % — ABNORMAL LOW (ref 36.0–46.0)
Hemoglobin: 7.9 g/dL — CL (ref 12.0–15.0)
LYMPHS PCT: 30.7 % (ref 12.0–46.0)
Lymphs Abs: 1.9 10*3/uL (ref 0.7–4.0)
MCHC: 32.7 g/dL (ref 30.0–36.0)
MCV: 87.6 fl (ref 78.0–100.0)
MONO ABS: 0.5 10*3/uL (ref 0.1–1.0)
Monocytes Relative: 7.6 % (ref 3.0–12.0)
NEUTROS ABS: 3.7 10*3/uL (ref 1.4–7.7)
NEUTROS PCT: 60.1 % (ref 43.0–77.0)
Platelets: 228 10*3/uL (ref 150.0–400.0)
RBC: 2.75 Mil/uL — AB (ref 3.87–5.11)
RDW: 14.6 % (ref 11.5–15.5)
WBC: 6.1 10*3/uL (ref 4.0–10.5)

## 2015-04-06 LAB — VITAMIN B12: Vitamin B-12: 111 pg/mL — ABNORMAL LOW (ref 211–911)

## 2015-04-06 LAB — HEMOGLOBIN A1C: Hgb A1c MFr Bld: 7.5 % — ABNORMAL HIGH (ref 4.6–6.5)

## 2015-04-06 MED ORDER — GLIMEPIRIDE 2 MG PO TABS: ORAL_TABLET | ORAL | Status: DC

## 2015-04-06 MED ORDER — PANTOPRAZOLE SODIUM 40 MG PO TBEC: 40.0000 mg | DELAYED_RELEASE_TABLET | Freq: Two times a day (BID) | ORAL | Status: DC

## 2015-04-06 MED ORDER — PRAVASTATIN SODIUM 20 MG PO TABS: 20.0000 mg | ORAL_TABLET | Freq: Every day | ORAL | Status: DC

## 2015-04-06 NOTE — Patient Instructions (Addendum)
Glimeperide 1/2 tab in am and FULL tab in the evening   Please check blood sugars at least half the time about 2 hours after any meal and 3 times per week on waking up. Please bring blood sugar monitor to each visit. Recommended blood sugar levels about 2 hours after meal is 140-180 and on waking up 90-130

## 2015-04-06 NOTE — Telephone Encounter (Signed)
Please let patient know that she has severe anemia, needs to start OTC ferrous sulfate iron tablets at lunch and dinner. Also please schedule follow-up appointment with gastroenterology to rule out GI bleeding.  She was seen by Dr. Hilarie Fredrickson in the hospital and this will be considered to follow-up  Also we need to have her get a CBC checked on Monday again, please find out if she is getting a home health nurse coming who can draw this

## 2015-04-06 NOTE — Progress Notes (Signed)
Quick Note:  Please let patient know that the vitamin B-12 level is also very low and she needs to pick up OTC vitamin B-12, 1000 g daily  ______

## 2015-04-06 NOTE — Progress Notes (Signed)
Pre visit review using our clinic review tool, if applicable. No additional management support is needed unless otherwise documented below in the visit note. 

## 2015-04-06 NOTE — Progress Notes (Signed)
Patient ID: [name], female   DOB: 09/25/1915, 81 y.o.   MRN: [id number]   Reason for Appointment: Follow-up of several problems  History of Present Illness   1.  GI bleeding:  She was admitted in March for bleeding most likely from a gastric ulcer and hemoglobin was down to about 8.5 requiring admission and transfusion She was in rehabilitation subsequently Has not been taking any iron recently and no recent hemoglobin available She does not have any abdominal pain or dark stools  2.  Type 2 DIABETES MELITUS, long-standing  She has been on low dose NovoLog at mealtimes along with Amaryl once a day for several years with fairly good control She does tend to have some postprandial hyperglycemia based on her diet  Since 2015 she has been taking a half tablet of Amaryl at dinnertime along with 2 mg in the morning also which has helped her fasting readings to be consistent  Recent history: Her blood sugars had been somewhat higher fasting more recently even though she thinks she is taking her half tablet of Amaryl in the evening She did have high readings during her hospitalization probably because of not getting appropriate insulin or Amaryl She is fairly compliant again with taking insulin with her meals Mostly taking 3 units of insulin before meals and does not adjust it much based on blood sugar levels Hypoglycemia: This is occurring only once around 4 PM last Sunday She has gained weight and is eating fairly well Does not check blood sugars after dinner  A1c is slightly better at 7.1 done during her hospitalization Sometimes not compliant with her insulin when she is eating out and blood sugar will be high when she comes back  Insulin: Usually 2-4 units of NovoLog insulin when eating   Blood sugars from her Accu check download   PRE-MEAL Breakfast Lunch Dinner Bedtime Overall  Glucose range: 163-197  100-265   59-221     Mean/median:  183    140    163      Wt Readings from Last 3 Encounters:  04/06/15 143 lb 3.2 oz (64.955 kg)  03/23/15 138 lb 3.2 oz (62.687 kg)  03/10/15 133 lb 12.8 oz (60.691 kg)   Lab Results  Component Value Date   HGBA1C 7.1* 03/04/2015   HGBA1C 8.5* 02/15/2015   HGBA1C 9.2* 12/12/2014   Lab Results  Component Value Date   MICROALBUR 3.3* 12/12/2014   LDLCALC 53 03/04/2015   CREATININE 1.43* 03/08/2015        Medication List       This list is accurate as of: 04/06/15 12:23 PM.  Always use your most recent med list.               ACCU-CHEK FASTCLIX LANCET Kit  USE AS DIRECTED FOR TESTING     ACCU-CHEK FASTCLIX LANCETS Misc     CALTRATE 600+D 600-400 MG-UNIT per tablet  Generic drug:  Calcium Carbonate-Vitamin D  Take 1 tablet by mouth daily.     glimepiride 2 MG tablet  Commonly known as:  AMARYL  TAKE 1 TABLET BY MOUTH IN THE MORNING AND 1/2 TABLET AT DINNER     glucose blood test strip  Commonly known as:  ACCU-CHEK AVIVA PLUS  Use to check blood sugar three times  a day     insulin aspart 100 UNIT/ML FlexPen  Commonly known as:  NOVOLOG FLEXPEN  If blood sugar is over 200, take 5 units and  if they go over 300, take 6 units at that meal.     Insulin Pen Needle 32G X 6 MM Misc  Commonly known as:  NOVOFINE  Use as directed     levothyroxine 88 MCG tablet  Commonly known as:  SYNTHROID, LEVOTHROID  Take 1 tablet daily     LYRICA 75 MG capsule  Generic drug:  pregabalin  TAKE 1 CAPSULE TWICE DAILY     pantoprazole 40 MG tablet  Commonly known as:  PROTONIX  Take 1 tablet (40 mg total) by mouth 2 (two) times daily.     pravastatin 20 MG tablet  Commonly known as:  PRAVACHOL  Take 1 tablet (20 mg total) by mouth daily.     psyllium 58.6 % powder  Commonly known as:  METAMUCIL  Take 1 packet by mouth as directed.     traMADol 50 MG tablet  Commonly known as:  ULTRAM  TAKE 1 TABLET BY MOUTH TWICE DAILY        Allergies:  Allergies  Allergen Reactions  . Other      Ground red pepper lotion to rub on her knee caused SOB    Past Medical History  Diagnosis Date  . Hypothyroidism   . Type II diabetes mellitus   . Tuberculosis 1938    During the Great Depression  . Migraines     "a long long time ago"  . Arthritis     "a little; in my joints" (03/02/2015)    Past Surgical History  Procedure Laterality Date  . Hip fracture surgery Bilateral   . Fracture surgery    . Tonsillectomy    . Cataract extraction w/ intraocular lens  implant, bilateral Bilateral   . Esophagogastroduodenoscopy N/A 03/03/2015    Procedure: ESOPHAGOGASTRODUODENOSCOPY (EGD);  Surgeon: Inda Castle, MD;  Location: [facility];  Service: Endoscopy;  Laterality: N/A;  . Esophagogastroduodenoscopy N/A 03/07/2015    Procedure: ESOPHAGOGASTRODUODENOSCOPY (EGD);  Surgeon: Jerene Bears, MD;  Location[facility];  Service: Endoscopy;  Laterality: N/A;    Family History  Problem Relation Age of Onset  . Diabetes Father     Social History:  reports that she has never smoked. She has never used smokeless tobacco. She reports that she drinks alcohol. She reports that she does not use illicit drugs.  Review of Systems:  Lab Results  Component Value Date   WBC 7.8 03/08/2015   HGB 8.7* 03/08/2015   HCT 26.7* 03/08/2015   MCV 95.0 03/08/2015   PLT 166 03/08/2015    EDEMA: She has had more consistent swelling of her lower legs.  She thinks she is taking her Lasix daily but not clear if she is remembering this correctly She is trying to wear her elastic stockings fairly consistently and is using them today Usually the swelling is gone by the morning Her weight has also increased reflecting edema  HYPOTHYROIDISM:  She has been on thyroid supplement long-term, currently taking 88 mcg, 5/7 days a week  and is compliant with this, using her pillbox for dispensing her medications  This is a lower dose than in the past Not complaining of  fatigue TSH results as follows:  Lab  Results  Component Value Date   FREET4 1.19 08/15/2014   FREET4 0.93 06/20/2014   FREET4 1.04 02/08/2014   TSH 0.73 02/15/2015   TSH 0.19* 12/12/2014   TSH 2.27 08/15/2014    LIPIDS: She  has been taking  pravastatin, previously had been on Lipitor.  She is tolerating  this quite well, lipids have been excellent   Lab Results  Component Value Date   CHOL 110 03/04/2015   HDL 40 03/04/2015   LDLCALC 53 03/04/2015   TRIG 86 03/04/2015   CHOLHDL 2.8 03/04/2015    NEUROPATHY: She has had long-standing neuropathy with numbness and paresthesiae, on Lyrica.  She  has had some progression of symptoms in 2015 especially the sense of numbness in her feet on the plantar surfaces. She does have some neuropathic symptoms still present with discomfort in the legs and feet but does not want to increase Lyrica because of the cost She also takes Ultram with her Lyrica   Diabetic foot exam in 11/15 showed decreased monofilament sensation in the toes and plantar surfaces    Mild chronic kidney disease: This is likely related to nephrosclerosis and creatinine is fluctuating but overall stable recently  Lab Results  Component Value Date   CREATININE 1.43* 03/08/2015   Her blood pressure has been fairly good without any medications , was initially high when checked in the office today   She is having issues with her memory and she is coping by making notes for her daily activities, reluctant to go to a nursing home because of cost      Examination:   BP 130/55 mmHg  Pulse 84  Temp(Src) 98.1 F (36.7 C) (Oral)  Wt 143 lb 3.2 oz (64.955 kg)  Body mass index is 23.83 kg/(m^2).   Blood pressure was checked a second time  She is alert and conversing well.  1+ edema present on the right ankle and 2+ on the left   ASSESSMENT/ PLAN:    Anemia of blood loss related to gastric ulcer in March: She continues to be on Protonix for this.  She does need to have a hemoglobin checked again today  and most likely will need iron supplements   Diabetes type 2   The patient's diabetes control appears to be overall worse and not clear why since she has not changed her regimen She has gained weight also Most of her high readings are in the morning recently but also has fluctuation later in the day. She may benefit from with adding additional half tablet of Amaryl in the evening She is also getting more help at home supervising her needs and probably is eating better   Peripheral neuropathy with usually getting relief from Ultram and Lyrica   RENAL insufficiency:  Still present but stable.  No hyperkalemia and will continue to monitor    Hypothyroidism: We will need periodic follow-up of her TSH  Leg edema: She can take Lasix consistently and also continue her elastic stockings  Patient Instructions  Glimeperide 1/2 tab in am and FULL tab in the evening   Please check blood sugars at least half the time about 2 hours after any meal and 3 times per week on waking up. Please bring blood sugar monitor to each visit. Recommended blood sugar levels about 2 hours after meal is 140-180 and on waking up 90-130    Counseling time over 50% of today's 25 minute visit  Melroy Bougher 04/06/2015, 12:23 PM

## 2015-04-06 NOTE — Telephone Encounter (Signed)
Patient is aware.  Lab appointment made.  Referral placed.

## 2015-04-07 DIAGNOSIS — I69398 Other sequelae of cerebral infarction: Secondary | ICD-10-CM | POA: Diagnosis not present

## 2015-04-07 DIAGNOSIS — K250 Acute gastric ulcer with hemorrhage: Secondary | ICD-10-CM | POA: Diagnosis not present

## 2015-04-07 DIAGNOSIS — D62 Acute posthemorrhagic anemia: Secondary | ICD-10-CM | POA: Diagnosis not present

## 2015-04-07 DIAGNOSIS — E1165 Type 2 diabetes mellitus with hyperglycemia: Secondary | ICD-10-CM | POA: Diagnosis not present

## 2015-04-07 DIAGNOSIS — M6281 Muscle weakness (generalized): Secondary | ICD-10-CM | POA: Diagnosis not present

## 2015-04-07 DIAGNOSIS — N189 Chronic kidney disease, unspecified: Secondary | ICD-10-CM | POA: Diagnosis not present

## 2015-04-07 DIAGNOSIS — Z794 Long term (current) use of insulin: Secondary | ICD-10-CM | POA: Diagnosis not present

## 2015-04-07 DIAGNOSIS — R269 Unspecified abnormalities of gait and mobility: Secondary | ICD-10-CM | POA: Diagnosis not present

## 2015-04-10 ENCOUNTER — Other Ambulatory Visit: Payer: Self-pay | Admitting: Endocrinology

## 2015-04-10 ENCOUNTER — Other Ambulatory Visit (INDEPENDENT_AMBULATORY_CARE_PROVIDER_SITE_OTHER): Payer: Commercial Managed Care - HMO

## 2015-04-10 DIAGNOSIS — D62 Acute posthemorrhagic anemia: Secondary | ICD-10-CM | POA: Diagnosis not present

## 2015-04-10 DIAGNOSIS — E1165 Type 2 diabetes mellitus with hyperglycemia: Secondary | ICD-10-CM | POA: Diagnosis not present

## 2015-04-10 DIAGNOSIS — E039 Hypothyroidism, unspecified: Secondary | ICD-10-CM

## 2015-04-10 DIAGNOSIS — IMO0002 Reserved for concepts with insufficient information to code with codable children: Secondary | ICD-10-CM

## 2015-04-10 LAB — HEMOGLOBIN A1C: HEMOGLOBIN A1C: 7.5 % — AB (ref 4.6–6.5)

## 2015-04-10 LAB — COMPREHENSIVE METABOLIC PANEL
ALT: 10 U/L (ref 0–35)
AST: 17 U/L (ref 0–37)
Albumin: 3.4 g/dL — ABNORMAL LOW (ref 3.5–5.2)
Alkaline Phosphatase: 61 U/L (ref 39–117)
BILIRUBIN TOTAL: 0.4 mg/dL (ref 0.2–1.2)
BUN: 28 mg/dL — ABNORMAL HIGH (ref 6–23)
CALCIUM: 9 mg/dL (ref 8.4–10.5)
CHLORIDE: 107 meq/L (ref 96–112)
CO2: 26 meq/L (ref 19–32)
Creatinine, Ser: 1.45 mg/dL — ABNORMAL HIGH (ref 0.40–1.20)
GFR: 35.46 mL/min — AB (ref >60.00)
Glucose, Bld: 172 mg/dL — ABNORMAL HIGH (ref 70–99)
Potassium: 4.7 mEq/L (ref 3.5–5.1)
SODIUM: 138 meq/L (ref 135–145)
TOTAL PROTEIN: 6.2 g/dL (ref 6.0–8.3)

## 2015-04-10 LAB — CBC
HCT: 25.1 % — ABNORMAL LOW (ref 36.0–46.0)
Hemoglobin: 8.3 g/dL — ABNORMAL LOW (ref 12.0–15.0)
MCHC: 32.9 g/dL (ref 30.0–36.0)
MCV: 85.4 fl (ref 78.0–100.0)
Platelets: 253 10*3/uL (ref 150.0–400.0)
RBC: 2.94 Mil/uL — AB (ref 3.87–5.11)
RDW: 15.3 % (ref 11.5–15.5)
WBC: 7 10*3/uL (ref 4.0–10.5)

## 2015-04-10 LAB — TSH: TSH: 2.47 u[IU]/mL (ref 0.35–4.50)

## 2015-04-10 LAB — BASIC METABOLIC PANEL
BUN: 28 mg/dL — AB (ref 6–23)
CALCIUM: 9 mg/dL (ref 8.4–10.5)
CHLORIDE: 107 meq/L (ref 96–112)
CO2: 26 mEq/L (ref 19–32)
CREATININE: 1.45 mg/dL — AB (ref 0.40–1.20)
GFR: 35.46 mL/min — AB (ref >60.00)
Glucose, Bld: 172 mg/dL — ABNORMAL HIGH (ref 70–99)
Potassium: 4.7 mEq/L (ref 3.5–5.1)
Sodium: 138 mEq/L (ref 135–145)

## 2015-04-10 NOTE — Progress Notes (Signed)
Quick Note:  Please let patient know that the anemia test is slightly better and to continue iron and B-12 as directed. Also needs to see gastroenterologist in follow-up as scheduled  ______

## 2015-04-11 ENCOUNTER — Telehealth: Payer: Self-pay | Admitting: Endocrinology

## 2015-04-11 NOTE — Telephone Encounter (Signed)
What surgery?

## 2015-04-11 NOTE — Telephone Encounter (Signed)
See note below and pleas advise, Thanks!

## 2015-04-11 NOTE — Telephone Encounter (Signed)
Patient stated that [employer] back PPA need Authorization from Dr Dwyane Dee to do  surgery on patient throat, her surgery apt time is April 26 @ 9:30 (she did not know the phone #)

## 2015-04-12 DIAGNOSIS — M6281 Muscle weakness (generalized): Secondary | ICD-10-CM | POA: Diagnosis not present

## 2015-04-12 DIAGNOSIS — Z794 Long term (current) use of insulin: Secondary | ICD-10-CM | POA: Diagnosis not present

## 2015-04-12 DIAGNOSIS — E1165 Type 2 diabetes mellitus with hyperglycemia: Secondary | ICD-10-CM | POA: Diagnosis not present

## 2015-04-12 DIAGNOSIS — R269 Unspecified abnormalities of gait and mobility: Secondary | ICD-10-CM | POA: Diagnosis not present

## 2015-04-12 DIAGNOSIS — N189 Chronic kidney disease, unspecified: Secondary | ICD-10-CM | POA: Diagnosis not present

## 2015-04-12 DIAGNOSIS — K250 Acute gastric ulcer with hemorrhage: Secondary | ICD-10-CM | POA: Diagnosis not present

## 2015-04-12 DIAGNOSIS — D62 Acute posthemorrhagic anemia: Secondary | ICD-10-CM | POA: Diagnosis not present

## 2015-04-12 DIAGNOSIS — I69398 Other sequelae of cerebral infarction: Secondary | ICD-10-CM | POA: Diagnosis not present

## 2015-04-13 ENCOUNTER — Other Ambulatory Visit: Payer: Self-pay

## 2015-04-13 DIAGNOSIS — K250 Acute gastric ulcer with hemorrhage: Secondary | ICD-10-CM | POA: Diagnosis not present

## 2015-04-13 DIAGNOSIS — N189 Chronic kidney disease, unspecified: Secondary | ICD-10-CM | POA: Diagnosis not present

## 2015-04-13 DIAGNOSIS — M6281 Muscle weakness (generalized): Secondary | ICD-10-CM | POA: Diagnosis not present

## 2015-04-13 DIAGNOSIS — D62 Acute posthemorrhagic anemia: Secondary | ICD-10-CM | POA: Diagnosis not present

## 2015-04-13 DIAGNOSIS — E1165 Type 2 diabetes mellitus with hyperglycemia: Secondary | ICD-10-CM | POA: Diagnosis not present

## 2015-04-13 DIAGNOSIS — R269 Unspecified abnormalities of gait and mobility: Secondary | ICD-10-CM | POA: Diagnosis not present

## 2015-04-13 DIAGNOSIS — Z794 Long term (current) use of insulin: Secondary | ICD-10-CM | POA: Diagnosis not present

## 2015-04-13 DIAGNOSIS — I69398 Other sequelae of cerebral infarction: Secondary | ICD-10-CM | POA: Diagnosis not present

## 2015-04-13 NOTE — Patient Outreach (Signed)
[facility]) Care Management  04/13/2015  [name] January 28, 1915 [id number]   Referral Date:  04/04/15 Referral Source: [facility] Delegation Screening: pending  Case Review:  PCP:  Dr. Eduard Clos, [place] [facility] [place], [place] Internal Medicine:  Dr. Elayne Snare [phone] Dr. Lucio Edward  Possible SNF admission:  H/o 03/08/15 Anthony noted in [place].  RN CM will confirm on contact.   Outreach screening call #1.  Patient not reached.  HIPAA compliant voice message left with name and contact #.  RN CM will reschedule for screening.     Mariann Laster, RN, BSN, [facility], CCM  [employer] Management Coordinator [phone] Office [phone] Direct [phone] Cell

## 2015-04-13 NOTE — Telephone Encounter (Signed)
There is no surgery.   Spoke to pt and she was needing information on where we were at with the referral process. She has been assigned to [place] with Dr. Hilarie Fredrickson. Informed pt that GI would call her to schedule that appt.

## 2015-04-14 ENCOUNTER — Telehealth: Payer: Self-pay | Admitting: Endocrinology

## 2015-04-14 DIAGNOSIS — I69398 Other sequelae of cerebral infarction: Secondary | ICD-10-CM | POA: Diagnosis not present

## 2015-04-14 DIAGNOSIS — E1165 Type 2 diabetes mellitus with hyperglycemia: Secondary | ICD-10-CM | POA: Diagnosis not present

## 2015-04-14 DIAGNOSIS — D62 Acute posthemorrhagic anemia: Secondary | ICD-10-CM | POA: Diagnosis not present

## 2015-04-14 DIAGNOSIS — M6281 Muscle weakness (generalized): Secondary | ICD-10-CM | POA: Diagnosis not present

## 2015-04-14 DIAGNOSIS — K250 Acute gastric ulcer with hemorrhage: Secondary | ICD-10-CM | POA: Diagnosis not present

## 2015-04-14 DIAGNOSIS — R269 Unspecified abnormalities of gait and mobility: Secondary | ICD-10-CM | POA: Diagnosis not present

## 2015-04-14 DIAGNOSIS — Z794 Long term (current) use of insulin: Secondary | ICD-10-CM | POA: Diagnosis not present

## 2015-04-14 DIAGNOSIS — N189 Chronic kidney disease, unspecified: Secondary | ICD-10-CM | POA: Diagnosis not present

## 2015-04-14 NOTE — Telephone Encounter (Signed)
Rhonda please call patient as she is requesting to speak with you    Thank you

## 2015-04-17 ENCOUNTER — Other Ambulatory Visit: Payer: Self-pay

## 2015-04-18 NOTE — Patient Outreach (Signed)
[facility]) Care Management  04/17/2015  Jemya Depierro Mellinger 1915/06/15 [id number]   Triage Screening outbound call #2 Referral Date:  04/04/15 Referral Source: [facility] Delegation Diabetes Type II   Contact, Sariah, [place] [phone] reached.  Confirmed current Primary MD:  Dr. Elayne Snare.  Plans to change primary MDs in October to Dr. Bevelyn Buckles (first appt available).   Cardiologist:  Dr. Jenkins Rouge.   States patient was able to control her diabetes with diet and exercise until about age 81yo.  States h/o mini stroke.  Per Epic: Admit date: 03/02/2015 - 03/08/2015.  Acute gastric ulcer with bleeding Patient discharged to SNF/Rehab for two weeks and from SNF to home with home health services which are complete.  Patient has private pay sitter two hours per day twice a day for 7 days a week to assist with meals and needs.  Provider:  [employer]er, [employer].  Son is happy with services being provided.  Son states that patient was very independent prior to stroke and was still driving.  States noting some changes mentally since stroke with forgetfulness and just not being quite as sharpe.  States current plan is enough for now and denies any needs or further assistance.  States telephonic disease management program would only cause patient to have more confusion and diabetes is well controlled with medications and insulin.   DME:  [facility], [facility]  Falls:  None in 2 years A1c:  7.5 on last noted date of 04/10/2015  RN CM provided introduction to [facility] services.   RN CM provided contact name and # for questions or concerns.  Advised may contact [facility] if needs change and care coordination services needed to include RN CM, SW, pharmacy services.   RN CM provided education on participating providers with [facility] and [place] participation.   RN CM advised to contact [employer] if services needed and out of network with [place].  RN CM encouraged to report any changes in condition or needs to primary  MD.  Son aware of 911 services for emergency needs.  Case closed to [facility] services at this time:  Patient eligible for program but refused to participate.   Mariann Laster, RN, BSN, [facility], [place] [facility] Care Management Care Management Coordinator [phone] Mady Haagensen, RN, BSN, [facility], [place] Management Care Management Coordinator [phone] Office [phone] Direct [phone] Cell [phone] Direct [phone] Cell

## 2015-04-21 ENCOUNTER — Encounter: Payer: Self-pay | Admitting: *Deleted

## 2015-04-21 DIAGNOSIS — D62 Acute posthemorrhagic anemia: Secondary | ICD-10-CM | POA: Diagnosis not present

## 2015-04-21 DIAGNOSIS — Z794 Long term (current) use of insulin: Secondary | ICD-10-CM | POA: Diagnosis not present

## 2015-04-21 DIAGNOSIS — K250 Acute gastric ulcer with hemorrhage: Secondary | ICD-10-CM | POA: Diagnosis not present

## 2015-04-21 DIAGNOSIS — E1165 Type 2 diabetes mellitus with hyperglycemia: Secondary | ICD-10-CM | POA: Diagnosis not present

## 2015-04-21 DIAGNOSIS — R269 Unspecified abnormalities of gait and mobility: Secondary | ICD-10-CM | POA: Diagnosis not present

## 2015-04-21 DIAGNOSIS — I69398 Other sequelae of cerebral infarction: Secondary | ICD-10-CM | POA: Diagnosis not present

## 2015-04-21 DIAGNOSIS — N189 Chronic kidney disease, unspecified: Secondary | ICD-10-CM | POA: Diagnosis not present

## 2015-04-21 DIAGNOSIS — M6281 Muscle weakness (generalized): Secondary | ICD-10-CM | POA: Diagnosis not present

## 2015-04-25 ENCOUNTER — Other Ambulatory Visit (INDEPENDENT_AMBULATORY_CARE_PROVIDER_SITE_OTHER): Payer: Commercial Managed Care - HMO

## 2015-04-25 ENCOUNTER — Ambulatory Visit (INDEPENDENT_AMBULATORY_CARE_PROVIDER_SITE_OTHER): Payer: Commercial Managed Care - HMO | Admitting: Nurse Practitioner

## 2015-04-25 ENCOUNTER — Encounter: Payer: Self-pay | Admitting: Nurse Practitioner

## 2015-04-25 VITALS — BP 122/68 | HR 84 | Ht 63.0 in | Wt 139.0 lb

## 2015-04-25 DIAGNOSIS — K253 Acute gastric ulcer without hemorrhage or perforation: Secondary | ICD-10-CM

## 2015-04-25 DIAGNOSIS — D62 Acute posthemorrhagic anemia: Secondary | ICD-10-CM

## 2015-04-25 LAB — CBC WITH DIFFERENTIAL/PLATELET
BASOS PCT: 0.5 % (ref 0.0–3.0)
Basophils Absolute: 0 10*3/uL (ref 0.0–0.1)
EOS ABS: 0.1 10*3/uL (ref 0.0–0.7)
Eosinophils Relative: 0.8 % (ref 0.0–5.0)
HCT: 27.2 % — ABNORMAL LOW (ref 36.0–46.0)
Hemoglobin: 8.9 g/dL — ABNORMAL LOW (ref 12.0–15.0)
Lymphocytes Relative: 36.9 % (ref 12.0–46.0)
Lymphs Abs: 2.9 10*3/uL (ref 0.7–4.0)
MCHC: 32.9 g/dL (ref 30.0–36.0)
MCV: 86.1 fl (ref 78.0–100.0)
MONOS PCT: 7.8 % (ref 3.0–12.0)
Monocytes Absolute: 0.6 10*3/uL (ref 0.1–1.0)
NEUTROS PCT: 54 % (ref 43.0–77.0)
Neutro Abs: 4.2 10*3/uL (ref 1.4–7.7)
PLATELETS: 167 10*3/uL (ref 150.0–400.0)
RBC: 3.16 Mil/uL — AB (ref 3.87–5.11)
RDW: 16.3 % — AB (ref 11.5–15.5)
WBC: 7.8 10*3/uL (ref 4.0–10.5)

## 2015-04-25 MED ORDER — PANTOPRAZOLE SODIUM 40 MG PO TBEC: 40.0000 mg | DELAYED_RELEASE_TABLET | Freq: Every day | ORAL | Status: DC

## 2015-04-25 NOTE — Patient Outreach (Signed)
[facility]) Care Management  04/25/2015  [name] Nov 08, 1915 [id number]   Received notification from Mariann Laster, RN to close due to patient refused to participate.  Ronnell Freshwater. [place] CM Assistant Phone: [phone] Fax: [phone]

## 2015-04-25 NOTE — Patient Instructions (Signed)
Your physician has requested that you go to the basement for the following lab work before leaving today: CBC  We have sent the following medications to your pharmacy for you to pick up at your convenience: Protonix 40 mg once daily (decreased from twice daily dosing)

## 2015-04-25 NOTE — Progress Notes (Signed)
     History of Present Illness:  Patient is a 81 year old female recently hospitalized with severe anemia. EGD revealed abnormal gastric fold with ulceration and bleeding requiring clip placement. A few days later patient underwent repeat endoscopy for purposes of obtaining biopsies of the abnormal mucosa. A Cameron's ulcer with intact hemoclip was found in the cardia.  Exam was otherwise normal. Biopsies showed mild chronic inactive atrophic gastritis with intestinal metaplasia  Current Medications, Allergies, Past Medical History, Past Surgical History, Family History and Social History were reviewed in [employer] record.  Physical Exam: General: Pleasant, well developed , white female in no acute distress Head: Normocephalic and atraumatic Eyes:  sclerae anicteric, conjunctiva pink  Ears: Normal auditory acuity Lungs: Clear throughout to auscultation Heart: Regular rate and rhythm Abdomen: Soft, non distended, non-tender. No masses, no hepatomegaly. Normal bowel sounds Musculoskeletal: Symmetrical with no gross deformities  Extremities: No edema  Neurological: Alert oriented x 4, grossly nonfocal Psychological:  Alert and cooperative. Normal mood and affect  Assessment and Recommendations:   1. Pleasant 81 year old female with recent upper GI bleed secondary to [facility] lesion, s/p hemoclip placement. No further bleeding. Will recheck CBC to make sure it is stable. Decrease PPI to once daily before breakfast.   2. Anemia of acute blood loss, s/p transfusion for hgb in 6 range. She is on daily iron now. Hgb 8.7 upon discharge early March. Will recheck today.

## 2015-04-26 ENCOUNTER — Encounter: Payer: Self-pay | Admitting: Nurse Practitioner

## 2015-04-27 NOTE — Progress Notes (Signed)
Reviewed and agree with management. Robert D. Kaplan, M.D., FACG  

## 2015-05-01 ENCOUNTER — Other Ambulatory Visit (INDEPENDENT_AMBULATORY_CARE_PROVIDER_SITE_OTHER): Payer: Commercial Managed Care - HMO

## 2015-05-01 DIAGNOSIS — D62 Acute posthemorrhagic anemia: Secondary | ICD-10-CM

## 2015-05-01 LAB — CBC
HCT: 29.2 % — ABNORMAL LOW (ref 36.0–46.0)
Hemoglobin: 9.6 g/dL — ABNORMAL LOW (ref 12.0–15.0)
MCHC: 32.6 g/dL (ref 30.0–36.0)
MCV: 86.6 fl (ref 78.0–100.0)
Platelets: 179 10*3/uL (ref 150.0–400.0)
RBC: 3.38 Mil/uL — ABNORMAL LOW (ref 3.87–5.11)
RDW: 16.7 % — ABNORMAL HIGH (ref 11.5–15.5)
WBC: 6.5 10*3/uL (ref 4.0–10.5)

## 2015-05-04 ENCOUNTER — Encounter: Payer: Self-pay | Admitting: Endocrinology

## 2015-05-04 ENCOUNTER — Ambulatory Visit (INDEPENDENT_AMBULATORY_CARE_PROVIDER_SITE_OTHER): Payer: Commercial Managed Care - HMO | Admitting: Endocrinology

## 2015-05-04 VITALS — BP 148/62 | HR 73 | Temp 98.3°F | Resp 14 | Ht 63.0 in | Wt 134.4 lb

## 2015-05-04 DIAGNOSIS — E1142 Type 2 diabetes mellitus with diabetic polyneuropathy: Secondary | ICD-10-CM

## 2015-05-04 DIAGNOSIS — R269 Unspecified abnormalities of gait and mobility: Secondary | ICD-10-CM | POA: Diagnosis not present

## 2015-05-04 DIAGNOSIS — E063 Autoimmune thyroiditis: Secondary | ICD-10-CM

## 2015-05-04 DIAGNOSIS — D62 Acute posthemorrhagic anemia: Secondary | ICD-10-CM | POA: Diagnosis not present

## 2015-05-04 DIAGNOSIS — E1165 Type 2 diabetes mellitus with hyperglycemia: Secondary | ICD-10-CM | POA: Diagnosis not present

## 2015-05-04 DIAGNOSIS — IMO0002 Reserved for concepts with insufficient information to code with codable children: Secondary | ICD-10-CM

## 2015-05-04 DIAGNOSIS — R634 Abnormal weight loss: Secondary | ICD-10-CM

## 2015-05-04 DIAGNOSIS — D510 Vitamin B12 deficiency anemia due to intrinsic factor deficiency: Secondary | ICD-10-CM | POA: Diagnosis not present

## 2015-05-04 DIAGNOSIS — K250 Acute gastric ulcer with hemorrhage: Secondary | ICD-10-CM | POA: Diagnosis not present

## 2015-05-04 DIAGNOSIS — I69398 Other sequelae of cerebral infarction: Secondary | ICD-10-CM | POA: Diagnosis not present

## 2015-05-04 DIAGNOSIS — Z794 Long term (current) use of insulin: Secondary | ICD-10-CM | POA: Diagnosis not present

## 2015-05-04 DIAGNOSIS — N189 Chronic kidney disease, unspecified: Secondary | ICD-10-CM | POA: Diagnosis not present

## 2015-05-04 DIAGNOSIS — E038 Other specified hypothyroidism: Secondary | ICD-10-CM

## 2015-05-04 DIAGNOSIS — M6281 Muscle weakness (generalized): Secondary | ICD-10-CM | POA: Diagnosis not present

## 2015-05-04 MED ORDER — PANTOPRAZOLE SODIUM 40 MG PO TBEC: 40.0000 mg | DELAYED_RELEASE_TABLET | Freq: Every day | ORAL | Status: DC

## 2015-05-04 NOTE — Patient Instructions (Addendum)
GLIMEPERIDE 2MG  ONE TWICE DAILY  Insulin 3-4 units AT MEAL TIME EVERY TIME you eat  Take iron after dinner time

## 2015-05-04 NOTE — Progress Notes (Signed)
Patient ID: [name], female   DOB: 1915-12-17, 81 y.o.   MRN: [id number]   Reason for Appointment: Follow-up of several problems  History of Present Illness   1.  Anemia:  She was admitted in March for bleeding most likely from a gastric ulcer and hemoglobin was down to about 8.5 requiring admission and transfusion Has  been taking any iron since her last visit and also was found to have vitamin B-12 deficiency which is being supplemented Hemoglobin is gradually improving and she did not feel unusually fatigued She is asking about continuing Protonix, was not given any guidance by gastroenterology specialist  Lab Results  Component Value Date   HGB 9.6* 05/01/2015   HGB 8.9* 04/25/2015   HGB 8.3 Repeated and verified X2.* 04/10/2015   HGB 7.9 cL* 04/06/2015    2.  Type 2 DIABETES MELITUS, long-standing  She has been on low dose NovoLog at mealtimes along with Amaryl once a day for several years with fairly good control She does tend to have some postprandial hyperglycemia based on her diet  Since 2015 she has been taking a half tablet of Amaryl at dinnertime along with 2 mg in the morning also which has helped her fasting readings to be consistent  Recent history:  Insulin doses: 2-4 units of NovoLog insulin   She has been somewhat confused about her insulin regimen and instead of taking NovoLog before each meal as she was doing she is only taking it as needed when the blood sugar is high Also checking blood sugar somewhat sporadically at random times in the morning or afternoon  Her blood sugar has also been  somewhat higher fasting more recently even though she thinks she is taking her half tablet of Amaryl in the evening as directed along with whole tablet of 2 mg in the morning  No hypoglycemia  She is not eating very well since her hospitalization and has lost weight  Does not check blood sugars after dinner  A1c is  7.5  Blood sugars from her Accu  check download  recently   PRE-MEAL Breakfast   4 PM  Overall  Glucose range:  139-184    94-367     Mean/median: 163      180      Wt Readings from Last 3 Encounters:  05/04/15 134 lb 6.4 oz (60.963 kg)  04/25/15 139 lb (63.05 kg)  04/06/15 143 lb 3.2 oz (64.955 kg)   Lab Results  Component Value Date   HGBA1C 7.5* 04/10/2015   HGBA1C 7.5* 04/06/2015   HGBA1C 7.1* 03/04/2015   Lab Results  Component Value Date   MICROALBUR 3.3* 12/12/2014   LDLCALC 53 03/04/2015   CREATININE 1.45* 04/10/2015   CREATININE 1.45* 04/10/2015        Medication List       This list is accurate as of: 05/04/15  9:51 PM.  Always use your most recent med list.               ACCU-CHEK FASTCLIX LANCET Kit  USE AS DIRECTED FOR TESTING     ACCU-CHEK FASTCLIX LANCETS Misc     aspirin 81 MG tablet  Take 81 mg by mouth daily.     bisacodyl 5 MG EC tablet  Commonly known as:  DULCOLAX  Take 5 mg by mouth daily as needed for moderate constipation.     CALTRATE 600+D 600-400 MG-UNIT per tablet  Generic drug:  Calcium Carbonate-Vitamin D  Take 1 tablet by mouth daily.     docusate sodium 100 MG capsule  Commonly known as:  COLACE  Take 100 mg by mouth daily as needed for mild constipation.     ferrous sulfate 325 (65 FE) MG tablet  Take 325 mg by mouth daily with breakfast.     furosemide 20 MG tablet  Commonly known as:  LASIX  Take 20 mg by mouth.     glimepiride 2 MG tablet  Commonly known as:  AMARYL  TAKE 1 TABLET BY MOUTH IN THE MORNING AND 1/2 TABLET AT DINNER     glucose blood test strip  Commonly known as:  ACCU-CHEK AVIVA PLUS  Use to check blood sugar three times  a day     insulin aspart 100 UNIT/ML FlexPen  Commonly known as:  NOVOLOG FLEXPEN  If blood sugar is over 200, take 5 units and if they go over 300, take 6 units at that meal.     Insulin Pen Needle 32G X 6 MM Misc  Commonly known as:  NOVOFINE  Use as directed     levothyroxine 88 MCG tablet    Commonly known as:  SYNTHROID, LEVOTHROID  Take 1 tablet daily     LYRICA 75 MG capsule  Generic drug:  pregabalin  TAKE 1 CAPSULE TWICE DAILY     pantoprazole 40 MG tablet  Commonly known as:  PROTONIX  Take 1 tablet (40 mg total) by mouth daily.     pravastatin 20 MG tablet  Commonly known as:  PRAVACHOL     psyllium 58.6 % powder  Commonly known as:  METAMUCIL  Take 1 packet by mouth as directed.     traMADol 50 MG tablet  Commonly known as:  ULTRAM  TAKE 1 TABLET BY MOUTH TWICE DAILY     vitamin B-12 1000 MCG tablet  Commonly known as:  CYANOCOBALAMIN  Take 1,000 mcg by mouth daily.        Allergies:  Allergies  Allergen Reactions  . Other     Ground red pepper lotion to rub on her knee caused SOB    Past Medical History  Diagnosis Date  . Hypothyroidism   . Type II diabetes mellitus   . Tuberculosis 1938    During the Great Depression  . Migraines     "a long long time ago"  . Arthritis     "a little; in my joints" (03/02/2015)  . Hiatal hernia   . Cameron ulcer   . Diabetic neuropathy   . Chronic kidney disease     stage 3  . Acute blood loss anemia   . Acute ischemic stroke   . Cerebellar stroke     Past Surgical History  Procedure Laterality Date  . Hip fracture surgery Bilateral   . Fracture surgery    . Tonsillectomy    . Cataract extraction w/ intraocular lens  implant, bilateral Bilateral   . Esophagogastroduodenoscopy N/A 03/03/2015    Procedure: ESOPHAGOGASTRODUODENOSCOPY (EGD);  Surgeon: Inda Castle, MD;  Location: [facility];  Service: Endoscopy;  Laterality: N/A;  . Esophagogastroduodenoscopy N/A 03/07/2015    Procedure: ESOPHAGOGASTRODUODENOSCOPY (EGD);  Surgeon: Jerene Bears, MD;  Location: [facility] ENDOSCOPY;  Service: Endoscopy;  Laterality: N/A;    Family History  Problem Relation Age of Onset  . Diabetes Father     Social History:  reports that she has never smoked. She has never used smokeless tobacco. She reports that she  drinks alcohol. She  reports that she does not use illicit drugs.  Review of Systems:  Lab Results  Component Value Date   WBC 6.5 05/01/2015   HGB 9.6* 05/01/2015   HCT 29.2* 05/01/2015   MCV 86.6 05/01/2015   PLT 179.0 05/01/2015    EDEMA: She has had  occasional edema of her lower legs She thinks she was having more swelling at the beginning of the week but better today Is trying to wear elastic stocking now She thinks she is taking her Lasix  as needed  HYPOTHYROIDISM:  She has been on thyroid supplement long-term, currently taking 88 mcg, 5/7 days a week  and is compliant with this, using her pillbox for dispensing her medications  Not complaining of  fatigue TSH results as follows:  Lab Results  Component Value Date   FREET4 1.19 08/15/2014   FREET4 0.93 06/20/2014   FREET4 1.04 02/08/2014   TSH 2.47 04/10/2015   TSH 0.73 02/15/2015   TSH 0.19* 12/12/2014    LIPIDS: She  has been taking  pravastatin, previously had been on Lipitor. She is tolerating  this quite well, lipids have been excellent   Lab Results  Component Value Date   CHOL 110 03/04/2015   HDL 40 03/04/2015   LDLCALC 53 03/04/2015   TRIG 86 03/04/2015   CHOLHDL 2.8 03/04/2015    NEUROPATHY: She has had long-standing neuropathy with numbness and paresthesiae, on Lyrica.  She  has had some progression of symptoms in 2015 especially the sense of numbness in her feet on the plantar surfaces. She also takes Ultram with her Lyrica as needed    Diabetic foot exam in 11/15 showed decreased monofilament sensation in the toes and plantar surfaces    Mild chronic kidney disease: This is likely related to nephrosclerosis and creatinine is fluctuating but overall stable recently  Lab Results  Component Value Date   CREATININE 1.45* 04/10/2015   CREATININE 1.45* 04/10/2015    She is having issues with her memory and she is coping by making notes for her daily activities, reluctant to go to a nursing  home because of cost Currently using a home aide for her various needs       Examination:   BP 148/62 mmHg  Pulse 73  Temp(Src) 98.3 F (36.8 C)  Resp 14  Ht '5\' 3"'  (1.6 m)  Wt 134 lb 6.4 oz (60.963 kg)  BMI 23.81 kg/m2  SpO2 95%  Body mass index is 23.81 kg/(m^2).    She is alert and conversing well.  trace edema on the legs    ASSESSMENT/ PLAN:    Anemia of blood loss related to gastric ulcer in March: Sheneeds to  continue to be on Protonix for this.  New prescription given    She does need to continue her iron and also B-12 supplement.  We'll check her B-12 level on the next visit to confirm adequacy of absorption and asked her to continue iron supplements indefinitely also.  Discussed that she needs to take her iron supplement at suppertime instead of around breakfast time because of taking thyroid medication Hemoglobin to be checked on the next visit    Diabetes type 2   The patient's diabetes control appears to be overall worse and she has not followed instructions for continuing her insulin which she has been doing previously for years She is also relying on a nursing aide at home and not clear if she is getting confused from various sources Discussed insulin regimen and  also timing of glucose monitoring She will increase her Amaryl to 2 mg at suppertime since fasting readings are consistently high   Decreased appetite: Etiology of this is not clear.  She will try to use boost for increasing her caloric intake   RENAL insufficiency:  Still present but stable.  No hyperkalemia and will continue to monitor    Hypothyroidism: We will need  follow-up of her TSH  Leg edema: She can take Lasix as needed as edema is better now and to continue her elastic stockings  Patient Instructions  GLIMEPERIDE 2MG ONE TWICE DAILY  Insulin 3-4 units AT MEAL TIME EVERY TIME you eat  Take iron after dinner time    Counseling time on subjects discussed above is over 50%  of today's 25 minute visit  Baker Kogler 05/04/2015, 9:51 PM

## 2015-05-08 ENCOUNTER — Other Ambulatory Visit: Payer: Self-pay | Admitting: Endocrinology

## 2015-05-22 ENCOUNTER — Ambulatory Visit: Payer: Medicare HMO | Admitting: Endocrinology

## 2015-05-22 ENCOUNTER — Telehealth: Payer: Self-pay | Admitting: Endocrinology

## 2015-05-22 DIAGNOSIS — I69398 Other sequelae of cerebral infarction: Secondary | ICD-10-CM | POA: Diagnosis not present

## 2015-05-22 DIAGNOSIS — R269 Unspecified abnormalities of gait and mobility: Secondary | ICD-10-CM | POA: Diagnosis not present

## 2015-05-22 DIAGNOSIS — E1165 Type 2 diabetes mellitus with hyperglycemia: Secondary | ICD-10-CM | POA: Diagnosis not present

## 2015-05-22 DIAGNOSIS — D62 Acute posthemorrhagic anemia: Secondary | ICD-10-CM | POA: Diagnosis not present

## 2015-05-22 DIAGNOSIS — M6281 Muscle weakness (generalized): Secondary | ICD-10-CM | POA: Diagnosis not present

## 2015-05-22 DIAGNOSIS — Z794 Long term (current) use of insulin: Secondary | ICD-10-CM | POA: Diagnosis not present

## 2015-05-22 DIAGNOSIS — K250 Acute gastric ulcer with hemorrhage: Secondary | ICD-10-CM | POA: Diagnosis not present

## 2015-05-22 DIAGNOSIS — N189 Chronic kidney disease, unspecified: Secondary | ICD-10-CM | POA: Diagnosis not present

## 2015-05-22 NOTE — Telephone Encounter (Signed)
LMV with RN - message leaving verbal okay for 5 more visits.

## 2015-05-22 NOTE — Telephone Encounter (Signed)
Nurse from [facility] would like an order for 5 more visits with [name] states that the patient is confused and needs more assistance with her insulin    Please advise    Thank you

## 2015-05-24 DIAGNOSIS — E1165 Type 2 diabetes mellitus with hyperglycemia: Secondary | ICD-10-CM | POA: Diagnosis not present

## 2015-05-24 DIAGNOSIS — Z794 Long term (current) use of insulin: Secondary | ICD-10-CM | POA: Diagnosis not present

## 2015-05-24 DIAGNOSIS — I69398 Other sequelae of cerebral infarction: Secondary | ICD-10-CM | POA: Diagnosis not present

## 2015-05-24 DIAGNOSIS — K250 Acute gastric ulcer with hemorrhage: Secondary | ICD-10-CM | POA: Diagnosis not present

## 2015-05-24 DIAGNOSIS — M6281 Muscle weakness (generalized): Secondary | ICD-10-CM | POA: Diagnosis not present

## 2015-05-24 DIAGNOSIS — N189 Chronic kidney disease, unspecified: Secondary | ICD-10-CM | POA: Diagnosis not present

## 2015-05-25 ENCOUNTER — Observation Stay (HOSPITAL_COMMUNITY)
Admission: EM | Admit: 2015-05-25 | Discharge: 2015-05-27 | Disposition: A | Discharge status: Home / Self Care | Payer: Commercial Managed Care - HMO | Attending: Internal Medicine | Admitting: Internal Medicine

## 2015-05-25 ENCOUNTER — Encounter (HOSPITAL_COMMUNITY): Payer: Self-pay | Admitting: Emergency Medicine

## 2015-05-25 ENCOUNTER — Emergency Department (HOSPITAL_COMMUNITY): Payer: Commercial Managed Care - HMO

## 2015-05-25 DIAGNOSIS — E1142 Type 2 diabetes mellitus with diabetic polyneuropathy: Secondary | ICD-10-CM

## 2015-05-25 DIAGNOSIS — Z8673 Personal history of transient ischemic attack (TIA), and cerebral infarction without residual deficits: Secondary | ICD-10-CM | POA: Diagnosis not present

## 2015-05-25 DIAGNOSIS — Z794 Long term (current) use of insulin: Secondary | ICD-10-CM | POA: Insufficient documentation

## 2015-05-25 DIAGNOSIS — N179 Acute kidney failure, unspecified: Secondary | ICD-10-CM | POA: Diagnosis not present

## 2015-05-25 DIAGNOSIS — F329 Major depressive disorder, single episode, unspecified: Secondary | ICD-10-CM | POA: Diagnosis not present

## 2015-05-25 DIAGNOSIS — R42 Dizziness and giddiness: Secondary | ICD-10-CM | POA: Diagnosis not present

## 2015-05-25 DIAGNOSIS — E785 Hyperlipidemia, unspecified: Secondary | ICD-10-CM | POA: Diagnosis not present

## 2015-05-25 DIAGNOSIS — R404 Transient alteration of awareness: Secondary | ICD-10-CM | POA: Diagnosis not present

## 2015-05-25 DIAGNOSIS — Z7982 Long term (current) use of aspirin: Secondary | ICD-10-CM | POA: Diagnosis not present

## 2015-05-25 DIAGNOSIS — R55 Syncope and collapse: Secondary | ICD-10-CM

## 2015-05-25 DIAGNOSIS — R001 Bradycardia, unspecified: Secondary | ICD-10-CM | POA: Diagnosis not present

## 2015-05-25 DIAGNOSIS — N189 Chronic kidney disease, unspecified: Secondary | ICD-10-CM | POA: Diagnosis not present

## 2015-05-25 DIAGNOSIS — E1140 Type 2 diabetes mellitus with diabetic neuropathy, unspecified: Secondary | ICD-10-CM | POA: Diagnosis present

## 2015-05-25 DIAGNOSIS — I951 Orthostatic hypotension: Secondary | ICD-10-CM | POA: Diagnosis not present

## 2015-05-25 DIAGNOSIS — G43909 Migraine, unspecified, not intractable, without status migrainosus: Secondary | ICD-10-CM | POA: Insufficient documentation

## 2015-05-25 DIAGNOSIS — J984 Other disorders of lung: Secondary | ICD-10-CM | POA: Diagnosis not present

## 2015-05-25 DIAGNOSIS — I4891 Unspecified atrial fibrillation: Secondary | ICD-10-CM | POA: Diagnosis not present

## 2015-05-25 DIAGNOSIS — E039 Hypothyroidism, unspecified: Secondary | ICD-10-CM | POA: Diagnosis present

## 2015-05-25 LAB — URINALYSIS, ROUTINE W REFLEX MICROSCOPIC
BILIRUBIN URINE: NEGATIVE
GLUCOSE, UA: 250 mg/dL — AB
HGB URINE DIPSTICK: NEGATIVE
KETONES UR: NEGATIVE mg/dL
Nitrite: NEGATIVE
PROTEIN: NEGATIVE mg/dL
Specific Gravity, Urine: 1.009 (ref 1.005–1.030)
Urobilinogen, UA: 0.2 mg/dL (ref 0.0–1.0)
pH: 6 (ref 5.0–8.0)

## 2015-05-25 LAB — COMPREHENSIVE METABOLIC PANEL
ALT: 13 U/L — ABNORMAL LOW (ref 14–54)
ANION GAP: 7 (ref 5–15)
AST: 23 U/L (ref 15–41)
Albumin: 3.4 g/dL — ABNORMAL LOW (ref 3.5–5.0)
Alkaline Phosphatase: 52 U/L (ref 38–126)
BILIRUBIN TOTAL: 0.5 mg/dL (ref 0.3–1.2)
BUN: 38 mg/dL — AB (ref 6–20)
CHLORIDE: 105 mmol/L (ref 101–111)
CO2: 27 mmol/L (ref 22–32)
Calcium: 9.2 mg/dL (ref 8.9–10.3)
Creatinine, Ser: 1.77 mg/dL — ABNORMAL HIGH (ref 0.44–1.00)
GFR calc Af Amer: 27 mL/min — ABNORMAL LOW (ref >60)
GFR calc non Af Amer: 23 mL/min — ABNORMAL LOW (ref >60)
Glucose, Bld: 344 mg/dL — ABNORMAL HIGH (ref 65–99)
POTASSIUM: 5.2 mmol/L — AB (ref 3.5–5.1)
Sodium: 139 mmol/L (ref 135–145)
TOTAL PROTEIN: 5.8 g/dL — AB (ref 6.5–8.1)

## 2015-05-25 LAB — CBC WITH DIFFERENTIAL/PLATELET
Basophils Absolute: 0 10*3/uL (ref 0.0–0.1)
Basophils Relative: 1 % (ref 0–1)
EOS ABS: 0 10*3/uL (ref 0.0–0.7)
Eosinophils Relative: 0 % (ref 0–5)
HCT: 34.6 % — ABNORMAL LOW (ref 36.0–46.0)
Hemoglobin: 11.1 g/dL — ABNORMAL LOW (ref 12.0–15.0)
Lymphocytes Relative: 33 % (ref 12–46)
Lymphs Abs: 2.1 10*3/uL (ref 0.7–4.0)
MCH: 28.5 pg (ref 26.0–34.0)
MCHC: 32.1 g/dL (ref 30.0–36.0)
MCV: 88.9 fL (ref 78.0–100.0)
Monocytes Absolute: 0.4 10*3/uL (ref 0.1–1.0)
Monocytes Relative: 6 % (ref 3–12)
NEUTROS ABS: 3.7 10*3/uL (ref 1.7–7.7)
Neutrophils Relative %: 60 % (ref 43–77)
Platelets: 118 10*3/uL — ABNORMAL LOW (ref 150–400)
RBC: 3.89 MIL/uL (ref 3.87–5.11)
RDW: 16.3 % — AB (ref 11.5–15.5)
WBC: 6.2 10*3/uL (ref 4.0–10.5)

## 2015-05-25 LAB — URINE MICROSCOPIC-ADD ON

## 2015-05-25 LAB — I-STAT TROPONIN, ED: TROPONIN I, POC: 0.01 ng/mL (ref 0.00–0.08)

## 2015-05-25 LAB — GLUCOSE, CAPILLARY
Glucose-Capillary: 181 mg/dL — ABNORMAL HIGH (ref 65–99)
Glucose-Capillary: 170 mg/dL — ABNORMAL HIGH (ref 65–99)

## 2015-05-25 LAB — T4, FREE: Free T4: 1.5 ng/dL — ABNORMAL HIGH (ref 0.61–1.12)

## 2015-05-25 LAB — TSH: TSH: 0.204 u[IU]/mL — AB (ref 0.350–4.500)

## 2015-05-25 MED ORDER — GLIMEPIRIDE 2 MG PO TABS
2.0000 mg | ORAL_TABLET | Freq: Every day | ORAL | Status: DC
  Administered 2015-05-26 – 2015-05-27 (×2): 2 mg via ORAL
  Filled 2015-05-25 (×3): qty 1

## 2015-05-25 MED ORDER — TRAMADOL HCL 50 MG PO TABS
50.0000 mg | ORAL_TABLET | Freq: Two times a day (BID) | ORAL | Status: DC
  Administered 2015-05-25 – 2015-05-27 (×4): 50 mg via ORAL
  Filled 2015-05-25 (×4): qty 1

## 2015-05-25 MED ORDER — SODIUM CHLORIDE 0.9 % IV SOLN
INTRAVENOUS | Status: DC
  Administered 2015-05-25 – 2015-05-26 (×2): via INTRAVENOUS

## 2015-05-25 MED ORDER — CALCIUM CARBONATE-VITAMIN D 600-400 MG-UNIT PO TABS: 1.0000 | ORAL_TABLET | Freq: Every day | ORAL | Status: DC

## 2015-05-25 MED ORDER — DOCUSATE SODIUM 100 MG PO CAPS: 100.0000 mg | ORAL_CAPSULE | Freq: Every day | ORAL | Status: DC | PRN

## 2015-05-25 MED ORDER — HYDRALAZINE HCL 20 MG/ML IJ SOLN
10.0000 mg | INTRAMUSCULAR | Status: DC | PRN
  Administered 2015-05-25: 10 mg via INTRAVENOUS
  Filled 2015-05-25: qty 1

## 2015-05-25 MED ORDER — FENTANYL CITRATE 0.05 MG/ML IJ SOLN: 25.0000 ug | INTRAMUSCULAR | Status: DC | PRN

## 2015-05-25 MED ORDER — ONDANSETRON HCL 4 MG PO TABS: 4.0000 mg | ORAL_TABLET | Freq: Four times a day (QID) | ORAL | Status: DC | PRN

## 2015-05-25 MED ORDER — PRAVASTATIN SODIUM 20 MG PO TABS
20.0000 mg | ORAL_TABLET | Freq: Every day | ORAL | Status: DC
  Administered 2015-05-25 – 2015-05-26 (×2): 20 mg via ORAL
  Filled 2015-05-25 (×3): qty 1

## 2015-05-25 MED ORDER — LEVOTHYROXINE SODIUM 50 MCG PO TABS
50.0000 ug | ORAL_TABLET | Freq: Every day | ORAL | Status: DC
  Administered 2015-05-26 – 2015-05-27 (×2): 50 ug via ORAL
  Filled 2015-05-25 (×3): qty 1

## 2015-05-25 MED ORDER — VITAMIN B-12 1000 MCG PO TABS
1000.0000 ug | ORAL_TABLET | Freq: Every day | ORAL | Status: DC
  Administered 2015-05-26 – 2015-05-27 (×2): 1000 ug via ORAL
  Filled 2015-05-25 (×2): qty 1

## 2015-05-25 MED ORDER — FERROUS SULFATE 325 (65 FE) MG PO TABS
325.0000 mg | ORAL_TABLET | Freq: Every day | ORAL | Status: DC
  Administered 2015-05-26 – 2015-05-27 (×2): 325 mg via ORAL
  Filled 2015-05-25 (×3): qty 1

## 2015-05-25 MED ORDER — HYDROCODONE-ACETAMINOPHEN 5-325 MG PO TABS: 1.0000 | ORAL_TABLET | ORAL | Status: DC | PRN

## 2015-05-25 MED ORDER — CALCIUM CARBONATE-VITAMIN D 500-200 MG-UNIT PO TABS
1.0000 | ORAL_TABLET | Freq: Every day | ORAL | Status: DC
  Administered 2015-05-26 – 2015-05-27 (×2): 1 via ORAL
  Filled 2015-05-25 (×3): qty 1

## 2015-05-25 MED ORDER — ONDANSETRON HCL 4 MG/2ML IJ SOLN
4.0000 mg | Freq: Four times a day (QID) | INTRAMUSCULAR | Status: DC | PRN
Start: 2015-05-25 — End: 2015-05-27

## 2015-05-25 MED ORDER — INSULIN ASPART 100 UNIT/ML ~~LOC~~ SOLN
0.0000 [IU] | Freq: Three times a day (TID) | SUBCUTANEOUS | Status: DC
  Administered 2015-05-25: 2 [IU] via SUBCUTANEOUS
  Administered 2015-05-26: 3 [IU] via SUBCUTANEOUS
  Administered 2015-05-26: 1 [IU] via SUBCUTANEOUS

## 2015-05-25 MED ORDER — ASPIRIN EC 81 MG PO TBEC
81.0000 mg | DELAYED_RELEASE_TABLET | Freq: Every day | ORAL | Status: DC
  Administered 2015-05-26 – 2015-05-27 (×2): 81 mg via ORAL
  Filled 2015-05-25 (×2): qty 1

## 2015-05-25 MED ORDER — MEPERIDINE HCL 25 MG/ML IJ SOLN: 6.2500 mg | INTRAMUSCULAR | Status: DC | PRN

## 2015-05-25 MED ORDER — HEPARIN SODIUM (PORCINE) 5000 UNIT/ML IJ SOLN
5000.0000 [IU] | Freq: Three times a day (TID) | INTRAMUSCULAR | Status: DC
  Administered 2015-05-25 – 2015-05-27 (×5): 5000 [IU] via SUBCUTANEOUS
  Filled 2015-05-25 (×7): qty 1

## 2015-05-25 MED ORDER — PROMETHAZINE HCL 25 MG/ML IJ SOLN: 6.2500 mg | INTRAMUSCULAR | Status: DC | PRN

## 2015-05-25 MED ORDER — ACETAMINOPHEN 650 MG RE SUPP: 650.0000 mg | Freq: Four times a day (QID) | RECTAL | Status: DC | PRN

## 2015-05-25 MED ORDER — PANTOPRAZOLE SODIUM 40 MG PO TBEC
40.0000 mg | DELAYED_RELEASE_TABLET | Freq: Two times a day (BID) | ORAL | Status: DC
  Administered 2015-05-25 – 2015-05-27 (×4): 40 mg via ORAL
  Filled 2015-05-25 (×4): qty 1

## 2015-05-25 MED ORDER — PREGABALIN 75 MG PO CAPS
75.0000 mg | ORAL_CAPSULE | Freq: Two times a day (BID) | ORAL | Status: DC
  Administered 2015-05-25 – 2015-05-27 (×4): 75 mg via ORAL
  Filled 2015-05-25 (×4): qty 1

## 2015-05-25 MED ORDER — ACETAMINOPHEN 325 MG PO TABS
650.0000 mg | ORAL_TABLET | Freq: Four times a day (QID) | ORAL | Status: DC | PRN
  Administered 2015-05-26 – 2015-05-27 (×2): 650 mg via ORAL
  Filled 2015-05-25 (×2): qty 2

## 2015-05-25 MED ORDER — ASPIRIN 81 MG PO TABS: 81.0000 mg | ORAL_TABLET | Freq: Every day | ORAL | Status: DC

## 2015-05-25 MED ORDER — BISACODYL 5 MG PO TBEC: 5.0000 mg | DELAYED_RELEASE_TABLET | Freq: Every day | ORAL | Status: DC | PRN

## 2015-05-25 NOTE — ED Notes (Addendum)
Pt was getting teeth cleaned and almost syncopized and became very dizzy all day. NO LOC. Stroke 8 wks ago with no baseline deficits. Takes care of self at home with aide that comes in morning. Short memory memory loss. Headache and dizziness reported. Aide called ambulance. Afib on monitor.

## 2015-05-25 NOTE — H&P (Signed)
Patient Demographics  [name], is a 81 y.o. female  MRN: [id number]   DOB - Feb 11, 1915  Admit Date - 05/25/2015  Outpatient Primary MD for the patient is No primary care provider on file.   With History of -  Past Medical History  Diagnosis Date  . Hypothyroidism   . Type II diabetes mellitus   . Tuberculosis 1938    During the Great Depression  . Migraines     "a long long time ago"  . Arthritis     "a little; in my joints" (03/02/2015)  . Hiatal hernia   . Cameron ulcer   . Diabetic neuropathy   . Chronic kidney disease     stage 3  . Acute blood loss anemia   . Acute ischemic stroke   . Cerebellar stroke       Past Surgical History  Procedure Laterality Date  . Hip fracture surgery Bilateral   . Fracture surgery    . Tonsillectomy    . Cataract extraction w/ intraocular lens  implant, bilateral Bilateral   . Esophagogastroduodenoscopy N/A 03/03/2015    Procedure: ESOPHAGOGASTRODUODENOSCOPY (EGD);  Surgeon: Inda Castle, MD;  Location: [facility];  Service: Endoscopy;  Laterality: N/A;  . Esophagogastroduodenoscopy N/A 03/07/2015    Procedure: ESOPHAGOGASTRODUODENOSCOPY (EGD);  Surgeon: Jerene Bears, MD;  Location: [facility] ENDOSCOPY;  Service: Endoscopy;  Laterality: N/A;    in for   Chief Complaint  Patient presents with  . Headache  . Dizziness     HPI  [name]  is a 81 y.o. female, with past medical history of gastric ulcer disease, hyperlipidemia, hypothyroidism, diabetes mellitus, who presents with complaints of dizziness, reports she had dental cleaning done yesterday, after that report dizziness and lightheadedness, resolved, at home had another episode at night, denies any chest pain, shortness of breath, palpitation, fever or chills, continues to have dizziness, so she presented to ED, ED workup was significant for worsening renal function with a creatinine of 1.77, baseline creatinine is 1.4, as well and was noticed to have sinus bradycardia  with heart rate in the 50s, patient had recent admission in March 2016, Where patient was found to have gastric ulcer disease secondary to NSAIDs, hospitalist requested to admit the patient for further evaluation.    Review of Systems    In addition to the HPI above,  No Fever-chills,  reports occasionalHeadache, hurts lightheadedness, No changes with Vision or hearing, No problems swallowing food or Liquids, No Chest pain, Cough or Shortness of Breath, No Abdominal pain, No Nausea or Vommitting, Bowel movements are regular, No Blood in stool or Urine, No dysuria, No new skin rashes or bruises, No new joints pains-aches,  No new weakness, tingling, numbness in any extremity, No recent weight gain or loss, No polyuria, polydypsia or polyphagia, No significant Mental Stressors.  A full 10 point Review of Systems was done, except as stated above, all other Review of Systems were negative.   Social History History  Substance Use Topics  . Smoking status: Never Smoker   . Smokeless tobacco: Never Used  . Alcohol Use: Yes     Comment: "I have had some alcohol in my younger days; a long time ago; when I was young"     Family History Family History  Problem Relation Age of Onset  . Diabetes Father      Prior to Admission medications   Medication Sig Start Date End Date Taking? Authorizing Provider  amoxicillin (AMOXIL)  500 MG capsule Take 2,000 mg by mouth once. One hour prior to dental treatment 05/22/15  Yes Historical Provider, MD  ACCU-CHEK FASTCLIX LANCETS [place]  11/23/14   Historical Provider, MD  aspirin 81 MG tablet Take 81 mg by mouth daily.    Historical Provider, MD  bisacodyl (DULCOLAX) 5 MG EC tablet Take 5 mg by mouth daily as needed for moderate constipation.    Historical Provider, MD  Calcium Carbonate-Vitamin D (CALTRATE 600+D) 600-400 MG-UNIT per tablet Take 1 tablet by mouth daily.    Historical Provider, MD  docusate sodium (COLACE) 100 MG capsule Take 100  mg by mouth daily as needed for mild constipation.    Historical Provider, MD  ferrous sulfate 325 (65 FE) MG tablet Take 325 mg by mouth daily with breakfast.    Historical Provider, MD  furosemide (LASIX) 20 MG tablet Take 20 mg by mouth.    Historical Provider, MD  glimepiride (AMARYL) 2 MG tablet TAKE 1 TABLET BY MOUTH IN THE MORNING AND 1/2 TABLET AT Wonda Cheng 04/06/15   Elayne Snare, MD  glucose blood (ACCU-CHEK AVIVA PLUS) test strip Use to check blood sugar three times  a day 11/21/14   Elayne Snare, MD  insulin aspart (NOVOLOG FLEXPEN) 100 UNIT/ML FlexPen If blood sugar is over 200, take 5 units and if they go over 300, take 6 units at that meal. Patient taking differently: 3 units before meals 02/16/15   Elayne Snare, MD  Insulin Pen Needle (NOVOFINE) 32G X 6 MM MISC Use as directed 11/21/14   Elayne Snare, MD  Lancets Misc. (ACCU-CHEK FASTCLIX LANCET) KIT USE AS DIRECTED FOR TESTING 11/23/14   Elayne Snare, MD  levothyroxine (SYNTHROID, LEVOTHROID) 88 MCG tablet Take 1 tablet daily Patient taking differently: Take 88 mcg by mouth See admin instructions. Take 1 tablet daily except for on Sundays and Wednesdays do not take any 04/26/14   Elayne Snare, MD  LYRICA 75 MG capsule TAKE 1 CAPSULE TWICE DAILY 01/02/15   Elayne Snare, MD  pantoprazole (PROTONIX) 40 MG tablet TAKE 1 TABLET BY MOUTH TWICE DAILY 05/08/15   Elayne Snare, MD  pravastatin (PRAVACHOL) 20 MG tablet  04/26/15   Historical Provider, MD  psyllium (METAMUCIL) 58.6 % powder Take 1 packet by mouth as directed.    Historical Provider, MD  traMADol (ULTRAM) 50 MG tablet TAKE 1 TABLET BY MOUTH TWICE DAILY 03/20/15   Elayne Snare, MD  vitamin B-12 (CYANOCOBALAMIN) 1000 MCG tablet Take 1,000 mcg by mouth daily.    Historical Provider, MD    Allergies  Allergen Reactions  . Other     Ground red pepper lotion to rub on her knee caused SOB    Physical Exam  Vitals  Blood pressure 163/84, pulse 54, temperature 98.3 F (36.8 C), temperature source Oral,  resp. rate 15, SpO2 99 %.   1. General  frail elderly female in bed in NAD,  2. Normal affect and insight, Not Suicidal or Homicidal, Awake Alert, Oriented X 3.  3. No F.N deficits, ALL C.Nerves Intact, Strength 5/5 all 4 extremities, Sensation intact all 4 extremities, Plantars down going.  4. Ears and Eyes appear Normal, Conjunctivae clear, PERRLA.dry Oral Mucosa.  5. Supple Neck, No JVD, No cervical lymphadenopathy appriciated, No Carotid Bruits.  6. Symmetrical Chest wall movement, Good air movement bilaterally, CTAB.  7. RRR, No Gallops, Rubs or Murmurs, No Parasternal Heave.  8. Positive Bowel Sounds, Abdomen Soft, No tenderness, No organomegaly appriciated,No rebound -guarding or rigidity.  9.  No Cyanosis, delay Skin Turgor, No Skin Rash or Bruise.  10. Good muscle tone,  joints appear normal , no effusions, Normal ROM.  11. No Palpable Lymph Nodes in Neck or Axillae    Data Review  CBC  Recent Labs Lab 05/25/15 1215  WBC 6.2  HGB 11.1*  HCT 34.6*  PLT 118*  MCV 88.9  MCH 28.5  MCHC 32.1  RDW 16.3*  LYMPHSABS 2.1  MONOABS 0.4  EOSABS 0.0  BASOSABS 0.0   ------------------------------------------------------------------------------------------------------------------  Chemistries   Recent Labs Lab 05/25/15 1215  NA 139  K 5.2*  CL 105  CO2 27  GLUCOSE 344*  BUN 38*  CREATININE 1.77*  CALCIUM 9.2  AST 23  ALT 13*  ALKPHOS 52  BILITOT 0.5   ------------------------------------------------------------------------------------------------------------------ CrCl cannot be calculated (Unknown ideal weight.). ------------------------------------------------------------------------------------------------------------------  Recent Labs  05/25/15 1215  TSH 0.204*     Coagulation profile No results for input(s): INR, PROTIME in the last 168  hours. ------------------------------------------------------------------------------------------------------------------- No results for input(s): DDIMER in the last 72 hours. -------------------------------------------------------------------------------------------------------------------  Cardiac Enzymes No results for input(s): CKMB, TROPONINI, MYOGLOBIN in the last 168 hours.  Invalid input(s): CK ------------------------------------------------------------------------------------------------------------------ Invalid input(s): POCBNP   ---------------------------------------------------------------------------------------------------------------  Urinalysis    Component Value Date/Time   COLORURINE YELLOW 12/12/2014 0929   APPEARANCEUR CLEAR 12/12/2014 0929   LABSPEC 1.015 12/12/2014 0929   PHURINE 6.0 12/12/2014 0929   GLUCOSEU NEGATIVE 12/12/2014 0929   GLUCOSEU NEGATIVE 03/15/2013 1426   HGBUR SMALL* 12/12/2014 0929   BILIRUBINUR NEGATIVE 12/12/2014 0929   KETONESUR TRACE* 12/12/2014 0929   PROTEINUR NEGATIVE 03/15/2013 1426   UROBILINOGEN 0.2 12/12/2014 0929   NITRITE NEGATIVE 12/12/2014 0929   LEUKOCYTESUR LARGE* 12/12/2014 0929    ----------------------------------------------------------------------------------------------------------------  Imaging results:   Dg Chest 2 View  05/25/2015   CLINICAL DATA:  81 year old female with dizziness and near syncope.  EXAM: CHEST  2 VIEW  COMPARISON:  03/02/2015 and prior radiographs dating back to 11/17/2009  FINDINGS: Upper limits normal heart size again noted.  Calcifications/granulomas and scarring in the right lower lung noted.  There is no evidence of focal airspace disease, pulmonary edema, suspicious pulmonary nodule/mass, pleural effusion, or pneumothorax.  No acute bony abnormalities are identified.  IMPRESSION: No active cardiopulmonary disease.   Electronically Signed   By: Margarette Canada M.D.   On: 05/25/2015 12:50    Ct Head Wo Contrast  05/25/2015   CLINICAL DATA:  Syncopal episode and dizziness. Recent history of cerebellar infarct.  EXAM: CT HEAD WITHOUT CONTRAST  TECHNIQUE: Contiguous axial images were obtained from the base of the skull through the vertex without intravenous contrast.  COMPARISON:  MRI brain 03/03/2015.  FINDINGS: Expected evolution of the right cerebellar infarct is noted. Remote lacunar infarcts are again noted within the basal ganglia and centrum semiovale, more prominent on the right.  No acute cortical infarct, hemorrhage, or mass lesion is present. The no definite new cerebellar infarct is seen.  Atherosclerotic calcifications are again noted within the left vertebral artery in bilateral cavernous internal carotid arteries.  The paranasal sinuses and mastoid air cells are clear.  Bilateral lens replacements are noted. The globes and orbits are otherwise intact.  IMPRESSION: 1. Affected evolution of right cerebellar infarct. 2. No definite new infarct is seen. 3. Remote lacunar infarcts of the basal ganglia and centrum semiovale bilaterally.   Electronically Signed   By: San Morelle M.D.   On: 05/25/2015 12:34    My personal review of EKG: Rhythm  NSR with short sinus pauses, Rate  59 /min, QTc  449 , no Acute ST changes, old RBBB     Assessment & Plan  Active Problems:   Hypothyroidism   DM type 2 (diabetes mellitus, type 2)   Diabetic neuropathy   Lightheadedness   Pre-syncope   Acute on chronic renal failure    Presyncope - Patient presents with dizziness and lightheadedness, requested ED to check orthostatics  as her symptoms mainly upon standing,will check urinalysis, as well patient appears to be clinically dehydrated, with worsening renal function , we'll hold her Lasix, and start her on gentle hydration . - We'll monitor on telemetry giving she has mild bradycardia, with mild sinus pause on EKG , recent echo showing EF 60-65% with no regional wall motion  abnormality .  Acute on chronic renal failure - Most likely related to dehydration and volume depletion, will hold Lasix, will continue with gentle hydration, avoid nephrotoxic medication.  Diabetes mellitus - Uncontrolled, will be started on insulin sliding scale, - Resume on Amaryl  Diabetic Neuropathy - continue with Lyrica  Hypothyroidism - Will decrease patient's Synthroid dose from 88 g to 50 g, given elevated thyroid level and decreased TSH, recheck TSH level in 8 weeks.  rophylaxis Heparin - AM Labs Ordered, also please review Full Orders  Family Communication: Admission, patients condition and plan of care including tests being ordered have been discussed with the patient and daughter at bedside who indicate understanding and agree with the plan and Code Status.  Code Status Full  Likely DC to  Home when stable  Condition GUARDED    Time spent in minutes : 55 minutes    ,  M.D on 05/25/2015 at 3:49 PM  Between 7am to 7pm - Pager - [phone]  After 7pm go to [email or web address] - password [email or web address]  And look for the night coverage person covering me after hours  [facility] Office  [phone]

## 2015-05-25 NOTE — Progress Notes (Addendum)
Report received from [facility] for admission to [id number]  3:25 PM Benjamine Mola notified that bed placement is going to change bed placement to another unit

## 2015-05-25 NOTE — ED Provider Notes (Signed)
CSN: [id number]     Arrival date & time 05/25/15  1127 History   First MD Initiated Contact with Patient 05/25/15 1131     Chief Complaint  Patient presents with  . Headache  . Dizziness     (Consider location/radiation/quality/duration/timing/severity/associated sxs/prior Treatment) HPI Comments: 81 year old female past medical history significant for diabetes, anemia, multiple previous CVAs comes in with chief complaint of lightheadedness. Patient states she was the dentist yesterday getting teeth cleaned. After this appointment she noted she was slightly lightheaded. Since that time she is describing waxing and waning lightheadedness. No room spinning type dizziness. Patient has been taking her medications. He had no other change to health. She does endorse a headache this morning which was relieved with Tylenol. Was not thunderclap or worst of life.  Patient is a 81 y.o. female presenting with dizziness.  Dizziness Quality:  Lightheadedness Severity:  Moderate Onset quality:  Gradual Duration:  1 day Timing:  Intermittent Progression:  Waxing and waning Chronicity:  New Relieved by:  Nothing Worsened by:  Nothing Associated symptoms: no chest pain and no shortness of breath     Past Medical History  Diagnosis Date  . Hypothyroidism   . Type II diabetes mellitus   . Tuberculosis 1938    During the Great Depression  . Migraines     "a long long time ago"  . Arthritis     "a little; in my joints" (03/02/2015)  . Hiatal hernia   . [place] ulcer   . Diabetic neuropathy   . Chronic kidney disease     stage 3  . Acute blood loss anemia   . Acute ischemic stroke   . Cerebellar stroke    Past Surgical History  Procedure Laterality Date  . Hip fracture surgery Bilateral   . Fracture surgery    . Tonsillectomy    . Cataract extraction w/ intraocular lens  implant, bilateral Bilateral   . Esophagogastroduodenoscopy N/A 03/03/2015    Procedure: ESOPHAGOGASTRODUODENOSCOPY  (EGD);  Surgeon: Inda Castle, MD;  Location: [place];  Service: Endoscopy;  Laterality: N/A;  . Esophagogastroduodenoscopy N/A 03/07/2015    Procedure: ESOPHAGOGASTRODUODENOSCOPY (EGD);  Surgeon: Jerene Bears, MD;  Location[facility] ENDOSCOPY;  Service: Endoscopy;  Laterality: N/A;   Family History  Problem Relation Age of Onset  . Diabetes Father    History  Substance Use Topics  . Smoking status: Never Smoker   . Smokeless tobacco: Never Used  . Alcohol Use: Yes     Comment: "I have had some alcohol in my younger days; a long time ago; when I was young"   OB History    No data available     Review of Systems  Constitutional: Negative for fever.  HENT: Negative for congestion.   Respiratory: Negative for shortness of breath.   Cardiovascular: Negative for chest pain.  Gastrointestinal: Negative for abdominal pain.  Musculoskeletal: Negative for back pain.  Neurological: Positive for light-headedness.  Psychiatric/Behavioral: Negative for confusion.  All other systems reviewed and are negative.     Allergies  Other  Home Medications   Prior to Admission medications   Medication Sig Start Date End Date Taking? Authorizing Provider  ACCU-CHEK FASTCLIX LANCETS [place]  11/23/14   Historical Provider, MD  aspirin 81 MG tablet Take 81 mg by mouth daily.    Historical Provider, MD  bisacodyl (DULCOLAX) 5 MG EC tablet Take 5 mg by mouth daily as needed for moderate constipation.    Historical Provider, MD  Calcium Carbonate-Vitamin D (CALTRATE 600+D) 600-400 MG-UNIT per tablet Take 1 tablet by mouth daily.    Historical Provider, MD  docusate sodium (COLACE) 100 MG capsule Take 100 mg by mouth daily as needed for mild constipation.    Historical Provider, MD  ferrous sulfate 325 (65 FE) MG tablet Take 325 mg by mouth daily with breakfast.    Historical Provider, MD  furosemide (LASIX) 20 MG tablet Take 20 mg by mouth.    Historical Provider, MD  glimepiride (AMARYL) 2 MG tablet  TAKE 1 TABLET BY MOUTH IN THE MORNING AND 1/2 TABLET AT Wonda Cheng 04/06/15   Elayne Snare, MD  glucose blood (ACCU-CHEK AVIVA PLUS) test strip Use to check blood sugar three times  a day 11/21/14   Elayne Snare, MD  insulin aspart (NOVOLOG FLEXPEN) 100 UNIT/ML FlexPen If blood sugar is over 200, take 5 units and if they go over 300, take 6 units at that meal. Patient taking differently: 3 units before meals 02/16/15   Elayne Snare, MD  Insulin Pen Needle (NOVOFINE) 32G X 6 MM MISC Use as directed 11/21/14   Elayne Snare, MD  Lancets Misc. (ACCU-CHEK FASTCLIX LANCET) KIT USE AS DIRECTED FOR TESTING 11/23/14   Elayne Snare, MD  levothyroxine (SYNTHROID, LEVOTHROID) 88 MCG tablet Take 1 tablet daily Patient taking differently: Take 88 mcg by mouth See admin instructions. Take 1 tablet daily except for on Sundays and Wednesdays do not take any 04/26/14   Elayne Snare, MD  LYRICA 75 MG capsule TAKE 1 CAPSULE TWICE DAILY 01/02/15   Elayne Snare, MD  pantoprazole (PROTONIX) 40 MG tablet TAKE 1 TABLET BY MOUTH TWICE DAILY 05/08/15   Elayne Snare, MD  pravastatin (PRAVACHOL) 20 MG tablet  04/26/15   Historical Provider, MD  psyllium (METAMUCIL) 58.6 % powder Take 1 packet by mouth as directed.    Historical Provider, MD  traMADol (ULTRAM) 50 MG tablet TAKE 1 TABLET BY MOUTH TWICE DAILY 03/20/15   Elayne Snare, MD  vitamin B-12 (CYANOCOBALAMIN) 1000 MCG tablet Take 1,000 mcg by mouth daily.    Historical Provider, MD   BP 189/43 mmHg  Pulse 52  Temp(Src) 98.3 F (36.8 C) (Oral)  Resp 12  SpO2 98% Physical Exam  Constitutional: She is oriented to person, place, and time. She appears well-developed.  HENT:  Head: Normocephalic.  Eyes: Pupils are equal, round, and reactive to light.  Neck: Normal range of motion.  Cardiovascular: Intact distal pulses.   A. fib, bradycardia  Pulmonary/Chest: Effort normal. No respiratory distress.  Abdominal: Soft.  Musculoskeletal: Normal range of motion. She exhibits no tenderness.   Neurological: She is alert and oriented to person, place, and time. No cranial nerve deficit. She exhibits normal muscle tone.  Skin: Skin is warm.  Psychiatric: She has a normal mood and affect.  Vitals reviewed.   ED Course  Procedures (including critical care time) Labs Review Labs Reviewed  CBC WITH DIFFERENTIAL/PLATELET - Abnormal; Notable for the following:    Hemoglobin 11.1 (*)    HCT 34.6 (*)    RDW 16.3 (*)    Platelets 118 (*)    All other components within normal limits  COMPREHENSIVE METABOLIC PANEL - Abnormal; Notable for the following:    Potassium 5.2 (*)    Glucose, Bld 344 (*)    BUN 38 (*)    Creatinine, Ser 1.77 (*)    Total Protein 5.8 (*)    Albumin 3.4 (*)    ALT 13 (*)  GFR calc non Af Amer 23 (*)    GFR calc Af Amer 27 (*)    All other components within normal limits  TSH - Abnormal; Notable for the following:    TSH 0.204 (*)    All other components within normal limits  T4, FREE - Abnormal; Notable for the following:    Free T4 1.50 (*)    All other components within normal limits  I-STAT TROPOININ, ED    Imaging Review Dg Chest 2 View  05/25/2015   CLINICAL DATA:  81 year old female with dizziness and near syncope.  EXAM: CHEST  2 VIEW  COMPARISON:  03/02/2015 and prior radiographs dating back to 11/17/2009  FINDINGS: Upper limits normal heart size again noted.  Calcifications/granulomas and scarring in the right lower lung noted.  There is no evidence of focal airspace disease, pulmonary edema, suspicious pulmonary nodule/mass, pleural effusion, or pneumothorax.  No acute bony abnormalities are identified.  IMPRESSION: No active cardiopulmonary disease.   Electronically Signed   By: Margarette Canada M.D.   On: 05/25/2015 12:50   Ct Head Wo Contrast  05/25/2015   CLINICAL DATA:  Syncopal episode and dizziness. Recent history of cerebellar infarct.  EXAM: CT HEAD WITHOUT CONTRAST  TECHNIQUE: Contiguous axial images were obtained from the base of the  skull through the vertex without intravenous contrast.  COMPARISON:  MRI brain 03/03/2015.  FINDINGS: Expected evolution of the right cerebellar infarct is noted. Remote lacunar infarcts are again noted within the basal ganglia and centrum semiovale, more prominent on the right.  No acute cortical infarct, hemorrhage, or mass lesion is present. The no definite new cerebellar infarct is seen.  Atherosclerotic calcifications are again noted within the left vertebral artery in bilateral cavernous internal carotid arteries.  The paranasal sinuses and mastoid air cells are clear.  Bilateral lens replacements are noted. The globes and orbits are otherwise intact.  IMPRESSION: 1. Affected evolution of right cerebellar infarct. 2. No definite new infarct is seen. 3. Remote lacunar infarcts of the basal ganglia and centrum semiovale bilaterally.   Electronically Signed   By: San Morelle M.D.   On: 05/25/2015 12:34     EKG Interpretation   Date/Time:  Thursday May 25 2015 11:30:45 EDT Ventricular Rate:  59 PR Interval:  277 QRS Duration: 143 QT Interval:  453 QTC Calculation: 449 R Axis:   -89 Text Interpretation:  Sinus or ectopic atrial rhythm Sinus pause Prolonged  PR interval RBBB and LAFB- No significant change since last tracing  Confirmed by Mingo Amber  MD, [place] ([id number]) on 05/25/2015 11:46:27 AM      MDM  81 year old female in to the emergency department with complaints of lightheadedness and near syncope. On exam both with myself and attending physician noted heart rate mainly in the 50s, however does dip into the mid 40s multiple times. EKG also showed prolonged PR interval and questionable atrial fibrillation. Patient without history of either. Basic labs and head CT were obtained. Thyroid studies slightly abnormal however not likely causing lightheadedness. CMP CBC fairly unremarkable and not likely explaining patient's symptoms. Head CT within normal limits. Patient continues to be  bradycardic and lightheaded. No organic reasons identified. Does not appear dehydrated on exam. He is slightly hypertensive however patient reports she is usually slightly hypertensive. We'll admit to hospitalist for continued management   Final diagnoses:  None       Robynn Pane, MD 05/25/15 1431  Evelina Bucy, MD 05/25/15 [phone]

## 2015-05-25 NOTE — ED Notes (Signed)
Pt is resting comfortably. Family at bedside.

## 2015-05-25 NOTE — ED Notes (Signed)
Water given  

## 2015-05-26 ENCOUNTER — Encounter (HOSPITAL_COMMUNITY): Payer: Self-pay | Admitting: General Practice

## 2015-05-26 DIAGNOSIS — F329 Major depressive disorder, single episode, unspecified: Secondary | ICD-10-CM | POA: Diagnosis not present

## 2015-05-26 DIAGNOSIS — N179 Acute kidney failure, unspecified: Secondary | ICD-10-CM | POA: Diagnosis not present

## 2015-05-26 DIAGNOSIS — I951 Orthostatic hypotension: Secondary | ICD-10-CM | POA: Diagnosis not present

## 2015-05-26 DIAGNOSIS — N189 Chronic kidney disease, unspecified: Secondary | ICD-10-CM | POA: Diagnosis not present

## 2015-05-26 DIAGNOSIS — R42 Dizziness and giddiness: Secondary | ICD-10-CM | POA: Diagnosis not present

## 2015-05-26 DIAGNOSIS — E785 Hyperlipidemia, unspecified: Secondary | ICD-10-CM | POA: Diagnosis not present

## 2015-05-26 DIAGNOSIS — G43909 Migraine, unspecified, not intractable, without status migrainosus: Secondary | ICD-10-CM | POA: Diagnosis not present

## 2015-05-26 DIAGNOSIS — E119 Type 2 diabetes mellitus without complications: Secondary | ICD-10-CM

## 2015-05-26 DIAGNOSIS — E0843 Diabetes mellitus due to underlying condition with diabetic autonomic (poly)neuropathy: Secondary | ICD-10-CM | POA: Diagnosis not present

## 2015-05-26 DIAGNOSIS — R55 Syncope and collapse: Secondary | ICD-10-CM

## 2015-05-26 DIAGNOSIS — E039 Hypothyroidism, unspecified: Secondary | ICD-10-CM | POA: Diagnosis not present

## 2015-05-26 DIAGNOSIS — E1140 Type 2 diabetes mellitus with diabetic neuropathy, unspecified: Secondary | ICD-10-CM | POA: Diagnosis not present

## 2015-05-26 DIAGNOSIS — Z8673 Personal history of transient ischemic attack (TIA), and cerebral infarction without residual deficits: Secondary | ICD-10-CM | POA: Diagnosis not present

## 2015-05-26 LAB — CBC
HCT: 33.2 % — ABNORMAL LOW (ref 36.0–46.0)
HEMOGLOBIN: 10.4 g/dL — AB (ref 12.0–15.0)
MCH: 27.9 pg (ref 26.0–34.0)
MCHC: 31.3 g/dL (ref 30.0–36.0)
MCV: 89 fL (ref 78.0–100.0)
PLATELETS: 117 10*3/uL — AB (ref 150–400)
RBC: 3.73 MIL/uL — ABNORMAL LOW (ref 3.87–5.11)
RDW: 16.5 % — ABNORMAL HIGH (ref 11.5–15.5)
WBC: 6.1 10*3/uL (ref 4.0–10.5)

## 2015-05-26 LAB — BASIC METABOLIC PANEL
ANION GAP: 6 (ref 5–15)
BUN: 32 mg/dL — ABNORMAL HIGH (ref 6–20)
CALCIUM: 8.6 mg/dL — AB (ref 8.9–10.3)
CO2: 25 mmol/L (ref 22–32)
CREATININE: 1.42 mg/dL — AB (ref 0.44–1.00)
Chloride: 108 mmol/L (ref 101–111)
GFR calc non Af Amer: 30 mL/min — ABNORMAL LOW (ref >60)
GFR, EST AFRICAN AMERICAN: 35 mL/min — AB (ref >60)
GLUCOSE: 140 mg/dL — AB (ref 65–99)
POTASSIUM: 3.8 mmol/L (ref 3.5–5.1)
SODIUM: 139 mmol/L (ref 135–145)

## 2015-05-26 LAB — GLUCOSE, CAPILLARY
GLUCOSE-CAPILLARY: 250 mg/dL — AB (ref 65–99)
Glucose-Capillary: 136 mg/dL — ABNORMAL HIGH (ref 65–99)
Glucose-Capillary: 104 mg/dL — ABNORMAL HIGH (ref 65–99)
Glucose-Capillary: 203 mg/dL — ABNORMAL HIGH (ref 65–99)

## 2015-05-26 LAB — HEMOGLOBIN A1C
Hgb A1c MFr Bld: 8.4 % — ABNORMAL HIGH (ref 4.8–5.6)
Mean Plasma Glucose: 194 mg/dL

## 2015-05-26 MED ORDER — INSULIN ASPART 100 UNIT/ML ~~LOC~~ SOLN
0.0000 [IU] | Freq: Every day | SUBCUTANEOUS | Status: DC
  Administered 2015-05-26: 2 [IU] via SUBCUTANEOUS

## 2015-05-26 MED ORDER — INSULIN ASPART 100 UNIT/ML ~~LOC~~ SOLN: 3.0000 [IU] | Freq: Three times a day (TID) | SUBCUTANEOUS | Status: DC

## 2015-05-26 MED ORDER — INSULIN GLARGINE 100 UNIT/ML ~~LOC~~ SOLN
5.0000 [IU] | Freq: Every day | SUBCUTANEOUS | Status: DC
  Administered 2015-05-26: 5 [IU] via SUBCUTANEOUS
  Filled 2015-05-26 (×2): qty 0.05

## 2015-05-26 MED ORDER — INSULIN ASPART 100 UNIT/ML ~~LOC~~ SOLN: 0.0000 [IU] | Freq: Three times a day (TID) | SUBCUTANEOUS | Status: DC

## 2015-05-26 NOTE — Plan of Care (Signed)
Problem: Consults Goal: General Medical Patient Education See Patient Education Module for specific education. Dx of syncope

## 2015-05-26 NOTE — Progress Notes (Signed)
05/26/2015 1100 Noted orders to d/c telemetry. Tele d/c per orders. Elonna Mcfarlane, Arville Lime

## 2015-05-26 NOTE — Progress Notes (Signed)
UR Completed. Dajana Gehrig, RN, BSN.  [phone] 

## 2015-05-26 NOTE — Progress Notes (Signed)
Pt had a couple of 1-2sec pauses in the night. Pt asymptomatic. Monitoring will continue.

## 2015-05-26 NOTE — Care Management Note (Signed)
Case Management Note  Patient Details  Name: [name] MRN: [id number] Date of Birth: Jun 14, 1915  Subjective/Objective:      Pt admitted with orthostatic hypotention              Action/Plan:  CM will assess pt and continue to monitor for disposition   Expected Discharge Date:                  Expected Discharge Plan:  Home/Self Care  In-House Referral:     Discharge planning Services  CM Consult  Post Acute Care Choice:    Choice offered to:     DME Arranged:    DME Agency:     HH Arranged:    HH Agency:     Status of Service:  In process, will continue to follow  Medicare Important Message Given:  Yes Date Medicare IM Given:  05/26/15 Medicare IM give by:  Elenor Quinones Date Additional Medicare IM Given:    Additional Medicare Important Message give by:     If discussed at [place] of Stay Meetings, dates discussed:    Additional Comments: 05/26/15 Elenor Quinones, RN, BSN [phone].  CM assessed pt, pt stated she had previously had [place] services but doesn't need them now.  Pt stated she had a comfort caregiver that comes in home for four hours a day providing; cooking, light cleaning and ADL assistance.  Pt stated she already has walker, cane, shower chair and toilet riser.  Pt informed CM that son stays close by and she has additional support from neighbors.  CM will continue to monitor for disposition needs.     Maryclare Labrador, RN 05/26/2015, 10:43 AM

## 2015-05-26 NOTE — Evaluation (Signed)
Physical Therapy Evaluation Patient Details Name: [name] MRN: [id number] DOB: 01/13/1915 Today's Date: 05/26/2015   History of Present Illness  Pt is a 81 y/o female with a PMH of gastric ulcer disease, hyperlipidemia, hypothyroidism, and DM. She presents with complaints of dizziness (on and off) after a dental cleaning. Workup in the ED was significant for worsening renal function, and sinus bradycardia with HR in the 50's.  Clinical Impression  Pt admitted with above diagnosis. Pt currently with functional limitations due to the deficits listed below (see PT Problem List). At the time of PT eval pt was able to perform transfers and ambulation with a RW for support and guarding/supervision for safety. Pt reports that she feels she is at her baseline of function and did not report any dizziness throughout session. Will continue to see acutely to decrease risk of falls and maintain strength/endurance while admitted. Pt will benefit from skilled PT to increase their independence and safety with mobility to allow discharge to the venue listed below.       Follow Up Recommendations No PT follow up    Equipment Recommendations  None recommended by PT    Recommendations for Other Services       Precautions / Restrictions Precautions Precautions: Fall Restrictions Weight Bearing Restrictions: No      Mobility  Bed Mobility Overal bed mobility: Needs Assistance Bed Mobility: Supine to Sit     Supine to sit: Supervision     General bed mobility comments: Supervision for safety. No assistance required.   Transfers Overall transfer level: Needs assistance Equipment used: Rolling walker (2 wheeled) Transfers: Sit to/from Stand Sit to Stand: Supervision         General transfer comment: Pt was able to power-up to full standing with no assistance required. Pt demonstrated proper hand placement and safety awareness.   Ambulation/Gait Ambulation/Gait assistance: Min  guard Ambulation Distance (Feet): 125 Feet Assistive device: Rolling walker (2 wheeled) Gait Pattern/deviations: Step-through pattern;Decreased stride length;Trunk flexed;Narrow base of support Gait velocity: Decreased Gait velocity interpretation: Below normal speed for age/gender General Gait Details: Pt was able to ambulate well with the RW. No unsteadiness or LOB noted, however close guard was provided for safety. Pt did not report any dizziness throughout gait training.   Stairs            Wheelchair Mobility    Modified Rankin (Stroke Patients Only)       Balance Overall balance assessment: Needs assistance Sitting-balance support: Feet supported;No upper extremity supported Sitting balance-Leahy Scale: Fair     Standing balance support: No upper extremity supported Standing balance-Leahy Scale: Fair                               Pertinent Vitals/Pain Pain Assessment: No/denies pain    Home Living Family/patient expects to be discharged to:: Private residence Living Arrangements: Alone Available Help at Discharge: Personal care attendant;Family;Available PRN/intermittently Type of Home: House       Home Layout: One level Home Equipment: Walker - 2 wheels Additional Comments: [employer] aide comes in 2 hours in the morning and 2 hours in the evening.     Prior Function Level of Independence: Needs assistance   Gait / Transfers Assistance Needed: Uses the RW all the time for assist  ADL's / Homemaking Assistance Needed: [employer] aide assists with dressing but pt states she can bathe independently. Aide also assists with taking care  of the home, and provides meals.   Comments: Pt is active and goes out with friends often. Pt states she had a full schedule for the holiday weekend with friends prior to being admitted.      Hand Dominance   Dominant Hand: Right    Extremity/Trunk Assessment   Upper Extremity Assessment: Overall WFL  for tasks assessed           Lower Extremity Assessment: Overall WFL for tasks assessed (Age-appropriate strength deficits)      Cervical / Trunk Assessment: Kyphotic  Communication   Communication: HOH  Cognition Arousal/Alertness: Awake/alert Behavior During Therapy: WFL for tasks assessed/performed Overall Cognitive Status: Within Functional Limits for tasks assessed                      General Comments      Exercises        Assessment/Plan    PT Assessment Patient needs continued PT services  PT Diagnosis Difficulty walking;Generalized weakness   PT Problem List Decreased strength;Decreased range of motion;Decreased activity tolerance;Decreased balance;Decreased mobility;Decreased knowledge of use of DME;Decreased safety awareness;Decreased knowledge of precautions  PT Treatment Interventions DME instruction;Gait training;Stair training;Functional mobility training;Therapeutic activities;Therapeutic exercise;Neuromuscular re-education;Patient/family education   PT Goals (Current goals can be found in the Care Plan section) Acute Rehab PT Goals Patient Stated Goal: Return to her home PT Goal Formulation: With patient Time For Goal Achievement: 06/02/15 Potential to Achieve Goals: Good    Frequency Min 3X/week   Barriers to discharge        Co-evaluation               End of Session Equipment Utilized During Treatment: Gait belt Activity Tolerance: Patient tolerated treatment well Patient left: in chair;with call bell/phone within reach Nurse Communication: Mobility status         Time: 0842-0905 PT Time Calculation (min) (ACUTE ONLY): 23 min   Charges:   PT Evaluation $Initial PT Evaluation Tier I: 1 Procedure PT Treatments $Gait Training: 8-22 mins   PT G Codes:        Rolinda Roan May 31, 2015, 10:28 AM   Rolinda Roan, PT, DPT Acute Rehabilitation Services Pager: [phone]

## 2015-05-26 NOTE — Progress Notes (Signed)
[facility] PROGRESS NOTE  Assessment/Plan: Lightheadedness/Pre-syncope due to to orthostatic hypotension: - orthostatic positive - held antihypertensive medications and lasix. - started IV fluids and creatinine cont to improved.cont IV hydration - re-check orthostatic in am.   Acute on chronic renal failure - likely pre-renal improving with hydration.    Hypothyroidism - con synthroid.   DM type 2 (diabetes mellitus, type 2) - add low dose lantus, cont SSI and glyburide.    Diabetic neuropathy Cont home medications.    Code Status: DNR Family Communication: none Disposition Plan: inpatient   Consultants:  none  Procedures:  CXR  CT head  Antibiotics:  None  HPI/Subjective: She relates she is still mild dizzy upon standing  Objective: Filed Vitals:   05/25/15 1701 05/25/15 2017 05/26/15 0458 05/26/15 1006  BP: 147/43 155/46 116/87 128/36  Pulse:  57 101   Temp:  98.2 F (36.8 C) 98.4 F (36.9 C)   TempSrc:  Oral Oral   Resp:  15 15   Height:      Weight:      SpO2:  99% 97%     Intake/Output Summary (Last 24 hours) at 05/26/15 1150 Last data filed at 05/25/15 1800  Gross per 24 hour  Intake    240 ml  Output      0 ml  Net    240 ml   Filed Weights   05/25/15 1607  Weight: 59.4 kg (130 lb 15.3 oz)    Exam:  General: Alert, awake, oriented x3, in no acute distress.  HEENT: No bruits, no goiter.  Heart: Regular rate and rhythm.systolic murmur. Lungs: Good air movement,clear Abdomen: Soft, nontender, nondistended, positive bowel sounds.  Neuro: Grossly intact, nonfocal.   Data Reviewed: Basic Metabolic Panel:  Recent Labs Lab 05/25/15 1215 05/26/15 0443  NA 139 139  K 5.2* 3.8  CL 105 108  CO2 27 25  GLUCOSE 344* 140*  BUN 38* 32*  CREATININE 1.77* 1.42*  CALCIUM 9.2 8.6*   Liver Function Tests:  Recent Labs Lab 05/25/15 1215  AST 23  ALT 13*  ALKPHOS 52  BILITOT 0.5  PROT 5.8*  ALBUMIN 3.4*   No  results for input(s): LIPASE, AMYLASE in the last 168 hours. No results for input(s): AMMONIA in the last 168 hours. CBC:  Recent Labs Lab 05/25/15 1215 05/26/15 0443  WBC 6.2 6.1  NEUTROABS 3.7  --   HGB 11.1* 10.4*  HCT 34.6* 33.2*  MCV 88.9 89.0  PLT 118* 117*   Cardiac Enzymes: No results for input(s): CKTOTAL, CKMB, CKMBINDEX, TROPONINI in the last 168 hours. BNP (last 3 results) No results for input(s): BNP in the last 8760 hours.  ProBNP (last 3 results) No results for input(s): PROBNP in the last 8760 hours.  CBG:  Recent Labs Lab 05/25/15 1707 05/25/15 2114 05/26/15 0539 05/26/15 1057  GLUCAP 181* 170* 136* 250*    No results found for this or any previous visit (from the past 240 hour(s)).   Studies: Dg Chest 2 View  05/25/2015   CLINICAL DATA:  81 year old female with dizziness and near syncope.  EXAM: CHEST  2 VIEW  COMPARISON:  03/02/2015 and prior radiographs dating back to 11/17/2009  FINDINGS: Upper limits normal heart size again noted.  Calcifications/granulomas and scarring in the right lower lung noted.  There is no evidence of focal airspace disease, pulmonary edema, suspicious pulmonary nodule/mass, pleural effusion, or pneumothorax.  No acute bony abnormalities are identified.  IMPRESSION: No active cardiopulmonary  disease.   Electronically Signed   By: Margarette Canada M.D.   On: 05/25/2015 12:50   Ct Head Wo Contrast  05/25/2015   CLINICAL DATA:  Syncopal episode and dizziness. Recent history of cerebellar infarct.  EXAM: CT HEAD WITHOUT CONTRAST  TECHNIQUE: Contiguous axial images were obtained from the base of the skull through the vertex without intravenous contrast.  COMPARISON:  MRI brain 03/03/2015.  FINDINGS: Expected evolution of the right cerebellar infarct is noted. Remote lacunar infarcts are again noted within the basal ganglia and centrum semiovale, more prominent on the right.  No acute cortical infarct, hemorrhage, or mass lesion is present.  The no definite new cerebellar infarct is seen.  Atherosclerotic calcifications are again noted within the left vertebral artery in bilateral cavernous internal carotid arteries.  The paranasal sinuses and mastoid air cells are clear.  Bilateral lens replacements are noted. The globes and orbits are otherwise intact.  IMPRESSION: 1. Affected evolution of right cerebellar infarct. 2. No definite new infarct is seen. 3. Remote lacunar infarcts of the basal ganglia and centrum semiovale bilaterally.   Electronically Signed   By: San Morelle M.D.   On: 05/25/2015 12:34    Scheduled Meds: . aspirin EC  81 mg Oral Daily  . calcium-vitamin D  1 tablet Oral Q breakfast  . ferrous sulfate  325 mg Oral Q breakfast  . glimepiride  2 mg Oral Q breakfast  . heparin  5,000 Units Subcutaneous 3 times per day  . insulin aspart  0-9 Units Subcutaneous TID WC  . levothyroxine  50 mcg Oral QAC breakfast  . pantoprazole  40 mg Oral BID  . pravastatin  20 mg Oral q1800  . pregabalin  75 mg Oral BID  . traMADol  50 mg Oral BID  . vitamin B-12  1,000 mcg Oral Daily   Continuous Infusions:   Time Spent: 25 min   Charlynne Cousins  [facility] Pager [phone]. If 7PM-7AM, please contact night-coverage at [email or web address], password [facility] 05/26/2015, 11:50 AM  LOS: 1 day

## 2015-05-27 DIAGNOSIS — E0843 Diabetes mellitus due to underlying condition with diabetic autonomic (poly)neuropathy: Secondary | ICD-10-CM

## 2015-05-27 DIAGNOSIS — Z8673 Personal history of transient ischemic attack (TIA), and cerebral infarction without residual deficits: Secondary | ICD-10-CM | POA: Diagnosis not present

## 2015-05-27 DIAGNOSIS — E039 Hypothyroidism, unspecified: Secondary | ICD-10-CM | POA: Diagnosis not present

## 2015-05-27 DIAGNOSIS — E119 Type 2 diabetes mellitus without complications: Secondary | ICD-10-CM | POA: Diagnosis not present

## 2015-05-27 DIAGNOSIS — R42 Dizziness and giddiness: Secondary | ICD-10-CM

## 2015-05-27 DIAGNOSIS — F329 Major depressive disorder, single episode, unspecified: Secondary | ICD-10-CM | POA: Diagnosis not present

## 2015-05-27 DIAGNOSIS — N189 Chronic kidney disease, unspecified: Secondary | ICD-10-CM | POA: Diagnosis not present

## 2015-05-27 DIAGNOSIS — N179 Acute kidney failure, unspecified: Secondary | ICD-10-CM | POA: Diagnosis not present

## 2015-05-27 DIAGNOSIS — I951 Orthostatic hypotension: Secondary | ICD-10-CM | POA: Diagnosis not present

## 2015-05-27 DIAGNOSIS — E1140 Type 2 diabetes mellitus with diabetic neuropathy, unspecified: Secondary | ICD-10-CM | POA: Diagnosis not present

## 2015-05-27 DIAGNOSIS — R55 Syncope and collapse: Secondary | ICD-10-CM | POA: Diagnosis not present

## 2015-05-27 DIAGNOSIS — G43909 Migraine, unspecified, not intractable, without status migrainosus: Secondary | ICD-10-CM | POA: Diagnosis not present

## 2015-05-27 DIAGNOSIS — E785 Hyperlipidemia, unspecified: Secondary | ICD-10-CM | POA: Diagnosis not present

## 2015-05-27 LAB — BASIC METABOLIC PANEL
Anion gap: 9 (ref 5–15)
BUN: 29 mg/dL — ABNORMAL HIGH (ref 6–20)
CO2: 25 mmol/L (ref 22–32)
Calcium: 8.5 mg/dL — ABNORMAL LOW (ref 8.9–10.3)
Chloride: 104 mmol/L (ref 101–111)
Creatinine, Ser: 1.56 mg/dL — ABNORMAL HIGH (ref 0.44–1.00)
GFR calc non Af Amer: 27 mL/min — ABNORMAL LOW (ref >60)
GFR, EST AFRICAN AMERICAN: 31 mL/min — AB (ref >60)
Glucose, Bld: 200 mg/dL — ABNORMAL HIGH (ref 65–99)
Potassium: 4.2 mmol/L (ref 3.5–5.1)
SODIUM: 138 mmol/L (ref 135–145)

## 2015-05-27 LAB — GLUCOSE, CAPILLARY
Glucose-Capillary: 112 mg/dL — ABNORMAL HIGH (ref 65–99)
Glucose-Capillary: 239 mg/dL — ABNORMAL HIGH (ref 65–99)

## 2015-05-27 MED ORDER — FUROSEMIDE 20 MG PO TABS: 20.0000 mg | ORAL_TABLET | ORAL | Status: DC

## 2015-05-27 NOTE — Discharge Summary (Signed)
Physician Discharge Summary  [name] Maxim TIW:[id number] DOB: 08-11-1915 DOA: 05/25/2015  PCP: No primary care provider on file.  Admit date: 05/25/2015 Discharge date: 05/27/2015  Time spent: 25 minutes  Recommendations for Outpatient Follow-up:  1. Follow-up with primary care doctor as an outpatient. Here will check her blood pressure and a basic metabolic panel. 2. Her Lasix was DC'd as she came into the hospital and acute renal failure and orthostatic.  Discharge Diagnoses:  Active Problems:   Hypothyroidism   DM type 2 (diabetes mellitus, type 2)   Diabetic neuropathy   Lightheadedness   Pre-syncope   Acute on chronic renal failure   Discharge Condition: Stable  Diet recommendation: heart healthy  Filed Weights   05/25/15 1607  Weight: 59.4 kg (130 lb 15.3 oz)    History of present illness:  81 y.o. female, with past medical history of gastric ulcer disease, hyperlipidemia, hypothyroidism, diabetes mellitus, who presents with complaints of dizziness, reports she had dental cleaning done yesterday, after that report dizziness and lightheadedness, resolved, at home had another episode at night, denies any chest pain, shortness of breath, palpitation, fever or chills, continues to have dizziness, so she presented to ED, ED workup was significant for worsening renal function with a creatinine of 1.77, baseline creatinine is 1.4, as well and was noticed to have sinus bradycardia with heart rate in the 50s, patient had recent admission in March 2016, Where patient was found to have gastric ulcer disease secondary to NSAIDs, hospitalist requested to admit the patient for further evaluation.  Hospital Course:  Lightheadedness/Pre-syncope due to to orthostatic hypotension: - Her antihypertensive medications and diuretics were held, she was started on aggressive IV fluid hydration. - With improvement in her orthostatic vitals and her renal failure. She was monitored on telemetry with  no events cardiac troponins were cycled which were negative. - Hold off Lasix.  Acute on chronic renal failure: - likely pre-renal resolved with hydration.   Hypothyroidism - con synthroid.  DM type 2 (diabetes mellitus, type 2) - No changes were made to her current regimen will follow-up with her primary care doctor.   Diabetic neuropathy Cont home medications.  Procedures:  CXR  CT head  Consultations:  none  Discharge Exam: Filed Vitals:   05/27/15 0441  BP: 150/55  Pulse: 62  Temp: 97.6 F (36.4 C)  Resp: 18    General: A&O x3 Cardiovascular: RRR Respiratory: good air movement CT B/L  Discharge Instructions   Discharge Instructions    Diet - low sodium heart healthy    Complete by:  As directed      Increase activity slowly    Complete by:  As directed           Current Discharge Medication List    CONTINUE these medications which have NOT CHANGED   Details  ACCU-CHEK FASTCLIX LANCETS MISC     aspirin 81 MG tablet Take 81 mg by mouth daily.    bisacodyl (DULCOLAX) 5 MG EC tablet Take 5 mg by mouth daily as needed for moderate constipation.    Calcium Carbonate-Vitamin D (CALTRATE 600+D) 600-400 MG-UNIT per tablet Take 1 tablet by mouth daily.    docusate sodium (COLACE) 100 MG capsule Take 100 mg by mouth daily as needed for mild constipation.    ferrous sulfate 325 (65 FE) MG tablet Take 325 mg by mouth daily with breakfast.    glimepiride (AMARYL) 2 MG tablet TAKE 1 TABLET BY MOUTH IN THE MORNING AND 1/2  TABLET AT DINNER Qty: 135 tablet, Refills: 0    glucose blood (ACCU-CHEK AVIVA PLUS) test strip Use to check blood sugar three times  a day Qty: 100 each, Refills: 3    insulin aspart (NOVOLOG FLEXPEN) 100 UNIT/ML FlexPen If blood sugar is over 200, take 5 units and if they go over 300, take 6 units at that meal. Qty: 15 mL, Refills: 2    Insulin Pen Needle (NOVOFINE) 32G X 6 MM MISC Use as directed Qty: 50 each, Refills: 3     Lancets Misc. (ACCU-CHEK FASTCLIX LANCET) KIT USE AS DIRECTED FOR TESTING Qty: 1 kit, Refills: 1    levothyroxine (SYNTHROID, LEVOTHROID) 88 MCG tablet Take 1 tablet daily Qty: 90 tablet, Refills: 3    LYRICA 75 MG capsule TAKE 1 CAPSULE TWICE DAILY Qty: 180 capsule, Refills: 1    pantoprazole (PROTONIX) 40 MG tablet TAKE 1 TABLET BY MOUTH TWICE DAILY Qty: 60 tablet, Refills: 3    psyllium (METAMUCIL) 58.6 % powder Take 1 packet by mouth as directed.    traMADol (ULTRAM) 50 MG tablet TAKE 1 TABLET BY MOUTH TWICE DAILY Qty: 60 tablet, Refills: 3    vitamin B-12 (CYANOCOBALAMIN) 1000 MCG tablet Take 1,000 mcg by mouth daily.      STOP taking these medications     amoxicillin (AMOXIL) 500 MG capsule      furosemide (LASIX) 20 MG tablet      pravastatin (PRAVACHOL) 20 MG tablet        Allergies  Allergen Reactions  . Other     Ground red pepper lotion to rub on her knee caused SOB      The results of significant diagnostics from this hospitalization (including imaging, microbiology, ancillary and laboratory) are listed below for reference.    Significant Diagnostic Studies: Dg Chest 2 View  05/25/2015   CLINICAL DATA:  81 year old female with dizziness and near syncope.  EXAM: CHEST  2 VIEW  COMPARISON:  03/02/2015 and prior radiographs dating back to 11/17/2009  FINDINGS: Upper limits normal heart size again noted.  Calcifications/granulomas and scarring in the right lower lung noted.  There is no evidence of focal airspace disease, pulmonary edema, suspicious pulmonary nodule/mass, pleural effusion, or pneumothorax.  No acute bony abnormalities are identified.  IMPRESSION: No active cardiopulmonary disease.   Electronically Signed   By: Margarette Canada M.D.   On: 05/25/2015 12:50   Ct Head Wo Contrast  05/25/2015   CLINICAL DATA:  Syncopal episode and dizziness. Recent history of cerebellar infarct.  EXAM: CT HEAD WITHOUT CONTRAST  TECHNIQUE: Contiguous axial images were  obtained from the base of the skull through the vertex without intravenous contrast.  COMPARISON:  MRI brain 03/03/2015.  FINDINGS: Expected evolution of the right cerebellar infarct is noted. Remote lacunar infarcts are again noted within the basal ganglia and centrum semiovale, more prominent on the right.  No acute cortical infarct, hemorrhage, or mass lesion is present. The no definite new cerebellar infarct is seen.  Atherosclerotic calcifications are again noted within the left vertebral artery in bilateral cavernous internal carotid arteries.  The paranasal sinuses and mastoid air cells are clear.  Bilateral lens replacements are noted. The globes and orbits are otherwise intact.  IMPRESSION: 1. Affected evolution of right cerebellar infarct. 2. No definite new infarct is seen. 3. Remote lacunar infarcts of the basal ganglia and centrum semiovale bilaterally.   Electronically Signed   By: San Morelle M.D.   On: 05/25/2015 12:34  Microbiology: No results found for this or any previous visit (from the past 240 hour(s)).   Labs: Basic Metabolic Panel:  Recent Labs Lab 05/25/15 1215 05/26/15 0443  NA 139 139  K 5.2* 3.8  CL 105 108  CO2 27 25  GLUCOSE 344* 140*  BUN 38* 32*  CREATININE 1.77* 1.42*  CALCIUM 9.2 8.6*   Liver Function Tests:  Recent Labs Lab 05/25/15 1215  AST 23  ALT 13*  ALKPHOS 52  BILITOT 0.5  PROT 5.8*  ALBUMIN 3.4*   No results for input(s): LIPASE, AMYLASE in the last 168 hours. No results for input(s): AMMONIA in the last 168 hours. CBC:  Recent Labs Lab 05/25/15 1215 05/26/15 0443  WBC 6.2 6.1  NEUTROABS 3.7  --   HGB 11.1* 10.4*  HCT 34.6* 33.2*  MCV 88.9 89.0  PLT 118* 117*   Cardiac Enzymes: No results for input(s): CKTOTAL, CKMB, CKMBINDEX, TROPONINI in the last 168 hours. BNP: BNP (last 3 results) No results for input(s): BNP in the last 8760 hours.  ProBNP (last 3 results) No results for input(s): PROBNP in the last  8760 hours.  CBG:  Recent Labs Lab 05/26/15 0539 05/26/15 1057 05/26/15 1600 05/26/15 2121 05/27/15 0554  GLUCAP 136* 250* 104* 203* 112*     Signed:  Charlynne Cousins  [facility] 05/27/2015, 8:17 AM

## 2015-05-27 NOTE — Progress Notes (Signed)
05/27/2015 12:21 PM D/c avs form, medications already taken today and those due this evening given and explained to patient. Follow up appointments and when to call MD reviewed. RX reviewed. D/c iv. D/c tele. D/c home per orders.

## 2015-05-30 DIAGNOSIS — E1165 Type 2 diabetes mellitus with hyperglycemia: Secondary | ICD-10-CM | POA: Diagnosis not present

## 2015-05-30 DIAGNOSIS — I69398 Other sequelae of cerebral infarction: Secondary | ICD-10-CM | POA: Diagnosis not present

## 2015-05-30 DIAGNOSIS — K250 Acute gastric ulcer with hemorrhage: Secondary | ICD-10-CM | POA: Diagnosis not present

## 2015-05-30 DIAGNOSIS — Z794 Long term (current) use of insulin: Secondary | ICD-10-CM | POA: Diagnosis not present

## 2015-05-30 DIAGNOSIS — N189 Chronic kidney disease, unspecified: Secondary | ICD-10-CM | POA: Diagnosis not present

## 2015-05-30 DIAGNOSIS — M6281 Muscle weakness (generalized): Secondary | ICD-10-CM | POA: Diagnosis not present

## 2015-05-31 ENCOUNTER — Encounter: Payer: Self-pay | Admitting: Podiatry

## 2015-05-31 ENCOUNTER — Ambulatory Visit (INDEPENDENT_AMBULATORY_CARE_PROVIDER_SITE_OTHER): Payer: Commercial Managed Care - HMO | Admitting: Podiatry

## 2015-05-31 DIAGNOSIS — E1149 Type 2 diabetes mellitus with other diabetic neurological complication: Secondary | ICD-10-CM

## 2015-05-31 DIAGNOSIS — M79676 Pain in unspecified toe(s): Secondary | ICD-10-CM

## 2015-05-31 DIAGNOSIS — L84 Corns and callosities: Secondary | ICD-10-CM | POA: Diagnosis not present

## 2015-05-31 DIAGNOSIS — B351 Tinea unguium: Secondary | ICD-10-CM

## 2015-05-31 DIAGNOSIS — M2010 Hallux valgus (acquired), unspecified foot: Secondary | ICD-10-CM

## 2015-05-31 DIAGNOSIS — E1140 Type 2 diabetes mellitus with diabetic neuropathy, unspecified: Secondary | ICD-10-CM | POA: Diagnosis not present

## 2015-05-31 NOTE — Progress Notes (Signed)
Patient ID: YUMI INSALACO, female   DOB: 05-Feb-1915, 81 y.o.   MRN: [id number]  Subjective: 81 y.o.-year-old female returns the office today for painful, elongated, thickened toenails which she cannot trim herself. Denies any redness or drainage around the nails. Also states she gets a painful callus to the left bunion, which irritates in shoegear. Denies any acute changes since last appointment and no new complaints today. Denies any systemic complaints such as fevers, chills, nausea, vomiting.   Objective: AAO 3, NAD DP/PT pulses palpable, CRT less than 3 seconds Protective sensation decreased with Simms Weinstein monofilament, Achilles tendon reflex intact.  Nails hypertrophic, dystrophic, elongated, brittle, discolored 10. There is tenderness overlying the nails 1-5 bilaterally. There is no surrounding erythema or drainage along the nail sites. Physical hyperkeratotic lesion along the medial aspect of the first metatarsal head overlying the side of a prominent bunion on the left foot. Upon debridement lesion there is no underlying ulceration, drainage or other clinical signs of infection. Hammertoe contractures are also identified. No open lesions or other pre-ulcerative lesions are identified. No other areas of tenderness bilateral lower extremities. No overlying edema, erythema, increased warmth. No pain with calf compression, swelling, warmth, erythema.  Assessment: Patient presents with symptomatic onychomycosis; pre-ulcerative callus left bunion  Plan: -Treatment options including alternatives, risks, complications were discussed -Nails sharply debrided 10 without complication/bleeding. -Hyperkeratotic lesion sharply debrided 1 without complication/bleeding -Prescription for diabetic shoes was given to the patient for [employer].  -Discussed daily foot inspection. If there are any changes, to call the office immediately.  -Follow-up in 3 months or sooner if any problems are to arise.  In the meantime, encouraged to call the office with any questions, concerns, changes symptoms.

## 2015-06-07 ENCOUNTER — Ambulatory Visit: Payer: Medicare HMO | Admitting: Endocrinology

## 2015-06-08 ENCOUNTER — Ambulatory Visit (INDEPENDENT_AMBULATORY_CARE_PROVIDER_SITE_OTHER): Payer: Commercial Managed Care - HMO | Admitting: Endocrinology

## 2015-06-08 ENCOUNTER — Other Ambulatory Visit: Payer: Self-pay | Admitting: *Deleted

## 2015-06-08 ENCOUNTER — Encounter: Payer: Self-pay | Admitting: Endocrinology

## 2015-06-08 VITALS — BP 140/40 | HR 66 | Temp 98.4°F | Resp 14 | Ht 63.0 in | Wt 137.0 lb

## 2015-06-08 DIAGNOSIS — D510 Vitamin B12 deficiency anemia due to intrinsic factor deficiency: Secondary | ICD-10-CM | POA: Diagnosis not present

## 2015-06-08 DIAGNOSIS — E038 Other specified hypothyroidism: Secondary | ICD-10-CM

## 2015-06-08 DIAGNOSIS — R600 Localized edema: Secondary | ICD-10-CM

## 2015-06-08 DIAGNOSIS — IMO0002 Reserved for concepts with insufficient information to code with codable children: Secondary | ICD-10-CM

## 2015-06-08 DIAGNOSIS — E1165 Type 2 diabetes mellitus with hyperglycemia: Secondary | ICD-10-CM

## 2015-06-08 DIAGNOSIS — E063 Autoimmune thyroiditis: Secondary | ICD-10-CM

## 2015-06-08 DIAGNOSIS — N289 Disorder of kidney and ureter, unspecified: Secondary | ICD-10-CM

## 2015-06-08 LAB — COMPREHENSIVE METABOLIC PANEL
ALK PHOS: 54 U/L (ref 39–117)
ALT: 13 U/L (ref 0–35)
AST: 20 U/L (ref 0–37)
Albumin: 3.7 g/dL (ref 3.5–5.2)
BUN: 27 mg/dL — ABNORMAL HIGH (ref 6–23)
CHLORIDE: 107 meq/L (ref 96–112)
CO2: 27 mEq/L (ref 19–32)
Calcium: 9.2 mg/dL (ref 8.4–10.5)
Creatinine, Ser: 1.53 mg/dL — ABNORMAL HIGH (ref 0.40–1.20)
GFR: 33.32 mL/min — ABNORMAL LOW (ref >60.00)
Glucose, Bld: 140 mg/dL — ABNORMAL HIGH (ref 70–99)
Potassium: 4.3 mEq/L (ref 3.5–5.1)
SODIUM: 138 meq/L (ref 135–145)
Total Bilirubin: 0.4 mg/dL (ref 0.2–1.2)
Total Protein: 6.2 g/dL (ref 6.0–8.3)

## 2015-06-08 LAB — CBC
HEMATOCRIT: 34.4 % — AB (ref 36.0–46.0)
Hemoglobin: 11 g/dL — ABNORMAL LOW (ref 12.0–15.0)
MCHC: 32.1 g/dL (ref 30.0–36.0)
MCV: 89.3 fl (ref 78.0–100.0)
Platelets: 146 10*3/uL — ABNORMAL LOW (ref 150.0–400.0)
RBC: 3.85 Mil/uL — AB (ref 3.87–5.11)
RDW: 18.3 % — AB (ref 11.5–15.5)
WBC: 5.7 10*3/uL (ref 4.0–10.5)

## 2015-06-08 LAB — VITAMIN B12: Vitamin B-12: 408 pg/mL (ref 211–911)

## 2015-06-08 LAB — HEMOGLOBIN A1C: Hgb A1c MFr Bld: 7.8 % — ABNORMAL HIGH (ref 4.6–6.5)

## 2015-06-08 LAB — TSH: TSH: 0.86 u[IU]/mL (ref 0.35–4.50)

## 2015-06-08 MED ORDER — FUROSEMIDE 20 MG PO TABS: ORAL_TABLET | ORAL | Status: DC

## 2015-06-08 NOTE — Progress Notes (Signed)
Patient ID: [name], female   DOB: 1915/01/28, 81 y.o.   MRN: [id number]   Reason for Appointment: Follow-up of several problems  History of Present Illness   1.  Anemia:  She was admitted in March for bleeding most likely from a gastric ulcer and hemoglobin was down to about 8.5 requiring admission and transfusion Has  been taking iron since her office follow-up and also was found to have vitamin B-12 deficiency which is being supplemented with 1000 g OTC Hemoglobin is gradually improving  She is continuing Protonix, was seen in 4/26 by GI in follow-up  Lab Results  Component Value Date   HGB 11.0* 06/08/2015   HGB 10.4* 05/26/2015   HGB 11.1* 05/25/2015   HGB 9.6* 05/01/2015    2.  Type 2 DIABETES MELITUS, long-standing  She has been on low dose NovoLog at mealtimes along with Amaryl once a day for several years with fairly good control She does tend to have some postprandial hyperglycemia based on her diet  Since 2015 she has been taking a half tablet of Amaryl at dinnertime along with 2 mg in the morning also which has helped her fasting readings to be consistent  Recent history:  Insulin doses: 3 units of NovoLog insulin   She has been more regular with taking her Novolog insulin before meals, she is getting some help with the caregiver also She may occasionally take 4 units when she is eating a large meal Overall blood sugars have improved since her last visit However she tends to have lower readings at times in the afternoon; does not always get much carbohydrate at lunch and will sometimes only eat soup and crackers. She says her appetite is fairly good overall and her weight has improved Home blood sugars are fairly consistent in the morning and occasionally around 150 around supper time, rarely 200 or more May have a high reading after breakfast with eating cereal like frosted flakes Does not check blood sugars after dinner  Blood sugars from her  Accu check download  recently   Mean values apply above for all meters except median for One Touch  PRE-MEAL Fasting  11 AM-1 PM  Dinner Bedtime Overall  Glucose range:  83-132   129-267   66-200     Mean/median:  143   200   149    150     Wt Readings from Last 3 Encounters:  06/08/15 137 lb (62.143 kg)  05/25/15 130 lb 15.3 oz (59.4 kg)  05/04/15 134 lb 6.4 oz (60.963 kg)   Lab Results  Component Value Date   HGBA1C 7.8* 06/08/2015   HGBA1C 8.4* 05/25/2015   HGBA1C 7.5* 04/10/2015   Lab Results  Component Value Date   MICROALBUR 3.3* 12/12/2014   LDLCALC 53 03/04/2015   CREATININE 1.53* 06/08/2015    EDEMA: She has had  recent edema of her lower legs She had been taking this daily before her recent hospitalization even though she was told to take it only as needed It is felt that she was dehydrated when she was admitted and she was told not to take any Lasix Is trying to wear elastic stocking now     Medication List       This list is accurate as of: 06/08/15 11:59 PM.  Always use your most recent med list.               ACCU-CHEK FASTCLIX LANCET Kit  USE AS DIRECTED  FOR TESTING     ACCU-CHEK FASTCLIX LANCETS Misc     amoxicillin 500 MG capsule  Commonly known as:  AMOXIL     aspirin 81 MG tablet  Take 81 mg by mouth daily.     bisacodyl 5 MG EC tablet  Commonly known as:  DULCOLAX  Take 5 mg by mouth daily as needed for moderate constipation.     CALTRATE 600+D 600-400 MG-UNIT per tablet  Generic drug:  Calcium Carbonate-Vitamin D  Take 1 tablet by mouth daily.     docusate sodium 100 MG capsule  Commonly known as:  COLACE  Take 100 mg by mouth daily as needed for mild constipation.     ferrous sulfate 325 (65 FE) MG tablet  Take 325 mg by mouth daily with breakfast.     furosemide 20 MG tablet  Commonly known as:  LASIX  Take 1 tablet as needed     glimepiride 2 MG tablet  Commonly known as:  AMARYL  TAKE 1 TABLET BY MOUTH IN THE MORNING  AND 1/2 TABLET AT DINNER     glucose blood test strip  Commonly known as:  ACCU-CHEK AVIVA PLUS  Use to check blood sugar three times  a day     insulin aspart 100 UNIT/ML FlexPen  Commonly known as:  NOVOLOG FLEXPEN  If blood sugar is over 200, take 5 units and if they go over 300, take 6 units at that meal.     Insulin Pen Needle 32G X 6 MM Misc  Commonly known as:  NOVOFINE  Use as directed     levothyroxine 88 MCG tablet  Commonly known as:  SYNTHROID, LEVOTHROID  Take 1 tablet daily     LYRICA 75 MG capsule  Generic drug:  pregabalin  TAKE 1 CAPSULE TWICE DAILY     pantoprazole 40 MG tablet  Commonly known as:  PROTONIX  TAKE 1 TABLET BY MOUTH TWICE DAILY     psyllium 58.6 % powder  Commonly known as:  METAMUCIL  Take 1 packet by mouth as directed.     traMADol 50 MG tablet  Commonly known as:  ULTRAM  TAKE 1 TABLET BY MOUTH TWICE DAILY     vitamin B-12 1000 MCG tablet  Commonly known as:  CYANOCOBALAMIN  Take 1,000 mcg by mouth daily.        Allergies:  Allergies  Allergen Reactions  . Other     Ground red pepper lotion to rub on her knee caused SOB    Past Medical History  Diagnosis Date  . Hypothyroidism   . Tuberculosis 1938    During the Great Depression  . Migraines     "a long long time ago"  . Arthritis     "a little; in my joints" (03/02/2015)  . Hiatal hernia   . Cameron ulcer   . Diabetic neuropathy   . Chronic kidney disease     stage 3  . Acute blood loss anemia   . Acute ischemic stroke   . Cerebellar stroke   . Type II diabetes mellitus     insulin dependent    Past Surgical History  Procedure Laterality Date  . Hip fracture surgery Bilateral   . Fracture surgery    . Tonsillectomy    . Cataract extraction w/ intraocular lens  implant, bilateral Bilateral   . Esophagogastroduodenoscopy N/A 03/03/2015    Procedure: ESOPHAGOGASTRODUODENOSCOPY (EGD);  Surgeon: Inda Castle, MD;  Location: [place];  Service: Endoscopy;  Laterality: N/A;  . Esophagogastroduodenoscopy N/A 03/07/2015    Procedure: ESOPHAGOGASTRODUODENOSCOPY (EGD);  Surgeon: Jerene Bears, MD;  Location[facility] ENDOSCOPY;  Service: Endoscopy;  Laterality: N/A;    Family History  Problem Relation Age of Onset  . Diabetes Father     Social History:  reports that she has never smoked. She has never used smokeless tobacco. She reports that she drinks alcohol. She reports that she does not use illicit drugs.  Review of Systems:  Lab Results  Component Value Date   WBC 5.7 06/08/2015   HGB 11.0* 06/08/2015   HCT 34.4* 06/08/2015   MCV 89.3 06/08/2015   PLT 146.0* 06/08/2015      HYPOTHYROIDISM:  She has been on thyroid supplement long-term, currently taking 88 mcg, 5/7 days a week  and is compliant with this, using her pillbox for dispensing her medications (weekly dosage equals 62 qd) Dosage was not changed in the hospital recently Not complaining of  fatigue TSH results as follows:  Lab Results  Component Value Date   FREET4 1.50* 05/25/2015   FREET4 1.19 08/15/2014   FREET4 0.93 06/20/2014   TSH 0.86 06/08/2015   TSH 0.204* 05/25/2015   TSH 2.47 04/10/2015    LIPIDS: She  has been taking  pravastatin, previously had been on Lipitor. She is tolerating  this quite well, lipids have been excellent   Lab Results  Component Value Date   CHOL 110 03/04/2015   HDL 40 03/04/2015   LDLCALC 53 03/04/2015   TRIG 86 03/04/2015   CHOLHDL 2.8 03/04/2015    NEUROPATHY: She has had long-standing neuropathy with numbness and paresthesiae, on Lyrica twice a day with relief.  She also takes Ultram with her Lyrica as needed    Diabetic foot exam in 11/15 showed decreased monofilament sensation in the toes and plantar surfaces    Mild chronic kidney disease: This is likely related to nephrosclerosis and creatinine is fluctuating.  Was higher when she was admitted recently  Lab Results  Component Value Date   CREATININE 1.53* 06/08/2015      She is having issues with her memory and she is coping by making notes for her daily activities Currently using a home aide for her various needs     Cerebellar Infarct: She had this during her earlier hospitalization this year and is unable to take any aspirin or Plavix because of GI bleeding Currently her balance is reasonably good   Examination:   BP 140/40 mmHg  Pulse 66  Temp(Src) 98.4 F (36.9 C)  Resp 14  Ht _0  (1.6 m)  Wt 137 lb (62.143 kg)  BMI 24.27 kg/m2  SpO2 96%  Body mass index is 24.27 kg/(m^2).    She is alert and conversing well. Heart sounds are normal 2+ edema on the legs She is walking fairly well with a walker  ASSESSMENT/ PLAN:    Anemia of blood loss related to gastric ulcer in March along with B-12 deficiency :She needs to  continue to be on Protonix for this.   She does need to continue her iron and also B-12 supplement. We'll check her B-12 level and CBC today   Diabetes type 2   The patient's diabetes control appears to be overall better since her last visit and this is mostly because of her being compliant with her insulin before meals Fasting blood sugars are also better; may be overall doing better on her diet also She tends to have low sugars after lunch  periodically although none below 66.  This is mostly when she is not eating a sandwich Discussed adjusting the dose based on carbohydrate intake Also she is getting high glycemic index meals at breakfast periodically and advise her to get a high-protein cereal instead She does not need to check her blood sugar every morning as these are very stable Also advised to follow instructions on her printout today She will continue Amaryl  2 mg at suppertime for now   RENAL insufficiency:  Still present and will need to recheck the levels today  Hyperlipidemia: She was told to stop pravastatin in the hospital and will need to continue this as she was taking to safely and has a history of  CVA   Hypothyroidism: We will need  follow-up of her TSH before changing her medication, not clear why her TSH was low recently in the hospital  Leg edema: She can take Lasix for the next 2 days and then as needed; she will continue her elastic stockings but try to use the newer ones, currently using the ones that are relatively loose.  Also she may benefit from reducing her Lyrica to once a day  Patient Instructions  No insulin at lunch if not eating bread/sandwich  Change cereal to a high protein like Kashi  Restart Pravastatin  Use the tightest stockings you have at home upto knee  Take fluid pill once daily for 2 days then only on the days the feet swell  Take Lyrica only once daily as needed     Counseling time on subjects discussed above is over 50% of today's 25 minute visit  Delquan Poucher 06/09/2015, 3:41 PM

## 2015-06-08 NOTE — Patient Instructions (Addendum)
No insulin at lunch if not eating bread/sandwich  Change cereal to a high protein like Kashi  Restart Pravastatin  Use the tightest stockings you have at home upto knee  Take fluid pill once daily for 2 days then only on the days the feet swell  Take Lyrica only once daily as needed

## 2015-06-09 NOTE — Progress Notes (Signed)
Quick Note:  Please let patient know that the thyroid level is okay, anemia better and B-12 okay. No change in medications  ______

## 2015-06-13 DIAGNOSIS — M6281 Muscle weakness (generalized): Secondary | ICD-10-CM | POA: Diagnosis not present

## 2015-06-13 DIAGNOSIS — Z794 Long term (current) use of insulin: Secondary | ICD-10-CM | POA: Diagnosis not present

## 2015-06-13 DIAGNOSIS — E1165 Type 2 diabetes mellitus with hyperglycemia: Secondary | ICD-10-CM | POA: Diagnosis not present

## 2015-06-13 DIAGNOSIS — N189 Chronic kidney disease, unspecified: Secondary | ICD-10-CM | POA: Diagnosis not present

## 2015-06-13 DIAGNOSIS — K250 Acute gastric ulcer with hemorrhage: Secondary | ICD-10-CM | POA: Diagnosis not present

## 2015-06-13 DIAGNOSIS — I69398 Other sequelae of cerebral infarction: Secondary | ICD-10-CM | POA: Diagnosis not present

## 2015-06-30 ENCOUNTER — Encounter: Payer: Self-pay | Admitting: Endocrinology

## 2015-06-30 ENCOUNTER — Ambulatory Visit (INDEPENDENT_AMBULATORY_CARE_PROVIDER_SITE_OTHER): Payer: Commercial Managed Care - HMO | Admitting: Endocrinology

## 2015-06-30 VITALS — BP 130/68 | HR 66 | Temp 98.4°F | Resp 17 | Wt 136.8 lb

## 2015-06-30 DIAGNOSIS — IMO0002 Reserved for concepts with insufficient information to code with codable children: Secondary | ICD-10-CM

## 2015-06-30 DIAGNOSIS — N289 Disorder of kidney and ureter, unspecified: Secondary | ICD-10-CM

## 2015-06-30 DIAGNOSIS — E063 Autoimmune thyroiditis: Secondary | ICD-10-CM

## 2015-06-30 DIAGNOSIS — R600 Localized edema: Secondary | ICD-10-CM

## 2015-06-30 DIAGNOSIS — D649 Anemia, unspecified: Secondary | ICD-10-CM

## 2015-06-30 DIAGNOSIS — E160 Drug-induced hypoglycemia without coma: Secondary | ICD-10-CM | POA: Diagnosis not present

## 2015-06-30 DIAGNOSIS — E1165 Type 2 diabetes mellitus with hyperglycemia: Secondary | ICD-10-CM | POA: Diagnosis not present

## 2015-06-30 DIAGNOSIS — E038 Other specified hypothyroidism: Secondary | ICD-10-CM

## 2015-06-30 DIAGNOSIS — T383X5A Adverse effect of insulin and oral hypoglycemic [antidiabetic] drugs, initial encounter: Secondary | ICD-10-CM

## 2015-06-30 NOTE — Patient Instructions (Addendum)
Try Taking Lyrica once daily  May take fluid pill upto 2x per week only  Reduce Glimeperide to 1/2 in am also  Take insulin at lunch only if eating a sandwich, otherwie none  Add a protein like nuts, lean meat or cheese to breakfast

## 2015-06-30 NOTE — Progress Notes (Signed)
Patient ID: [name], female   DOB: 12/18/1915, 81 y.o.   MRN: [id number]   Reason for Appointment: Follow-up of leg swelling and chronic issues  History of Present Illness   1.  Anemia:  She was admitted in March for bleeding most likely from a gastric ulcer and hemoglobin was down to about 8.5 requiring admission and transfusion Has  been taking iron and also vitamin B-12 which was deficient Hemoglobin is gradually improving  She is continuing Protonix  Lab Results  Component Value Date   HGB 11.0* 06/08/2015   HGB 10.4* 05/26/2015   HGB 11.1* 05/25/2015   HGB 9.6* 05/01/2015    2.  Type 2 DIABETES MELITUS, long-standing  She has been on low dose NovoLog at mealtimes along with Amaryl once a day for several years with fairly good control She does tend to have some postprandial hyperglycemia based on her diet  Since 2015 she has been taking a half tablet of Amaryl at dinnertime along with 2 mg in the morning also which has helped her fasting readings to be consistent  Recent history:  Insulin doses: 3 units of NovoLog insulin before each meal   She is taking mealtime insulin along with Amaryl; currently is being given her medications by her caregiver and she does not know what dose of Amaryl she is taking. She may occasionally take 4 units Novolog when she is eating a large meal Overall blood sugars have come down further since her last visit Current blood sugar patterns:  Her fasting blood sugars are relatively consistent and just above 100 usually, more consistent recently  She is checking her blood sugars randomly in the afternoons and has had aortic readings in the 60s between 12-5 PM, lowest reading 52  She also check some readings around 5-6 PM which are quite variable and sometimes high; not clear if she may miss her insulin sometimes if blood sugar is low normal before the meal or she will overtreat low normal sugars  She was told to avoid high  glycemic index cereals in the mornings  Does not check blood sugars after dinner  Blood sugars from her Accu check download  as follows:   Mean values apply above for all meters except median for One Touch  PRE-MEAL Fasting 12-4 PM Dinner Bedtime Overall  Glucose range:  94-157   52-157 59-272     Mean/median:  121   105   139    123     Wt Readings from Last 3 Encounters:  06/30/15 136 lb 12.8 oz (62.052 kg)  06/08/15 137 lb (62.143 kg)  05/25/15 130 lb 15.3 oz (59.4 kg)   Lab Results  Component Value Date   HGBA1C 7.8* 06/08/2015   HGBA1C 8.4* 05/25/2015   HGBA1C 7.5* 04/10/2015   Lab Results  Component Value Date   MICROALBUR 3.3* 12/12/2014   LDLCALC 53 03/04/2015   CREATININE 1.53* 06/08/2015    3.  EDEMA: She has had edema of her lower legs This was prominent on her recent visit and she was asked to take Lasix for 2 days and then as needed; other advised not to take it continuously because of previous Gwyndolyn Saxon the patient with this She thinks her edema was better with Lasix but has not repeated her doses yet She thinks she is still having some swelling  Is trying to wear elastic stocking      Medication List       This list is  accurate as of: 06/30/15 11:59 PM.  Always use your most recent med list.               ACCU-CHEK FASTCLIX LANCET Kit  USE AS DIRECTED FOR TESTING     ACCU-CHEK FASTCLIX LANCETS Misc     amoxicillin 500 MG capsule  Commonly known as:  AMOXIL     aspirin 81 MG tablet  Take 81 mg by mouth daily.     bisacodyl 5 MG EC tablet  Commonly known as:  DULCOLAX  Take 5 mg by mouth daily as needed for moderate constipation.     CALTRATE 600+D 600-400 MG-UNIT per tablet  Generic drug:  Calcium Carbonate-Vitamin D  Take 1 tablet by mouth daily.     docusate sodium 100 MG capsule  Commonly known as:  COLACE  Take 100 mg by mouth daily as needed for mild constipation.     ferrous sulfate 325 (65 FE) MG tablet  Take 325 mg by mouth  daily with breakfast.     furosemide 20 MG tablet  Commonly known as:  LASIX  Take 1 tablet as needed     glimepiride 2 MG tablet  Commonly known as:  AMARYL  TAKE 1 TABLET BY MOUTH IN THE MORNING AND 1/2 TABLET AT DINNER     glucose blood test strip  Commonly known as:  ACCU-CHEK AVIVA PLUS  Use to check blood sugar three times  a day     insulin aspart 100 UNIT/ML FlexPen  Commonly known as:  NOVOLOG FLEXPEN  If blood sugar is over 200, take 5 units and if they go over 300, take 6 units at that meal.     Insulin Pen Needle 32G X 6 MM Misc  Commonly known as:  NOVOFINE  Use as directed     levothyroxine 88 MCG tablet  Commonly known as:  SYNTHROID, LEVOTHROID  Take 1 tablet daily     LYRICA 75 MG capsule  Generic drug:  pregabalin  TAKE 1 CAPSULE TWICE DAILY     pantoprazole 40 MG tablet  Commonly known as:  PROTONIX  TAKE 1 TABLET BY MOUTH TWICE DAILY     pravastatin 20 MG tablet  Commonly known as:  PRAVACHOL  Take 20 mg by mouth daily.     psyllium 58.6 % powder  Commonly known as:  METAMUCIL  Take 1 packet by mouth as directed.     traMADol 50 MG tablet  Commonly known as:  ULTRAM  TAKE 1 TABLET BY MOUTH TWICE DAILY     vitamin B-12 1000 MCG tablet  Commonly known as:  CYANOCOBALAMIN  Take 1,000 mcg by mouth daily.        Allergies:  Allergies  Allergen Reactions  . Other     Ground red pepper lotion to rub on her knee caused SOB    Past Medical History  Diagnosis Date  . Hypothyroidism   . Tuberculosis 1938    During the Great Depression  . Migraines     "a long long time ago"  . Arthritis     "a little; in my joints" (03/02/2015)  . Hiatal hernia   . Cameron ulcer   . Diabetic neuropathy   . Chronic kidney disease     stage 3  . Acute blood loss anemia   . Acute ischemic stroke   . Cerebellar stroke   . Type II diabetes mellitus     insulin dependent    Past Surgical History  Procedure Laterality Date  . Hip fracture surgery  Bilateral   . Fracture surgery    . Tonsillectomy    . Cataract extraction w/ intraocular lens  implant, bilateral Bilateral   . Esophagogastroduodenoscopy N/A 03/03/2015    Procedure: ESOPHAGOGASTRODUODENOSCOPY (EGD);  Surgeon: Inda Castle, MD;  Location: [facility];  Service: Endoscopy;  Laterality: N/A;  . Esophagogastroduodenoscopy N/A 03/07/2015    Procedure: ESOPHAGOGASTRODUODENOSCOPY (EGD);  Surgeon: Jerene Bears, MD;  Location: [facility] ENDOSCOPY;  Service: Endoscopy;  Laterality: N/A;    Family History  Problem Relation Age of Onset  . Diabetes Father     Social History:  reports that she has never smoked. She has never used smokeless tobacco. She reports that she drinks alcohol. She reports that she does not use illicit drugs.  Review of Systems:  Lab Results  Component Value Date   WBC 5.7 06/08/2015   HGB 11.0* 06/08/2015   HCT 34.4* 06/08/2015   MCV 89.3 06/08/2015   PLT 146.0* 06/08/2015      HYPOTHYROIDISM:  She has been on thyroid supplement long-term, currently taking 88 mcg, 5/7 days a week  and is compliant with this, using her pillbox for dispensing her medications (weekly dosage equals 62 qd) Dosage was not changed recently  thyroid levels are as follows:  Lab Results  Component Value Date   TSH 0.86 06/08/2015   TSH 0.204* 05/25/2015   TSH 2.47 04/10/2015   FREET4 1.50* 05/25/2015   FREET4 1.19 08/15/2014   FREET4 0.93 06/20/2014     LIPIDS: She  has been taking  pravastatin, previously had been on Lipitor. She is tolerating  this quite well, lipids have been excellent   Lab Results  Component Value Date   CHOL 110 03/04/2015   HDL 40 03/04/2015   LDLCALC 53 03/04/2015   TRIG 86 03/04/2015   CHOLHDL 2.8 03/04/2015    NEUROPATHY: She has had long-standing neuropathy with numbness and paresthesiae, on Lyrica twice a day with relief.   She was told to reduce Lyrica to once a day because of her edema but she thinks she is still taking it twice  a  She also takes Ultram with her Lyrica as needed    Diabetic foot exam in 11/15 showed decreased monofilament sensation in the toes and plantar surfaces    Mild chronic kidney disease: This is likely related to nephrosclerosis and creatinine is fluctuating.    Lab Results  Component Value Date   CREATININE 1.53* 06/08/2015    She is having issues with her memory and this appears to be somewhat progressive  Currently using a home aide for her various needs     Cerebellar Infarct: She had this during her hospitalization this year and is unable to take any aspirin or Plavix because of GI bleeding Currently her balance is reasonably good and uses a walker consistently, no recent falls    Examination:   BP 130/68 mmHg  Pulse 66  Temp(Src) 98.4 F (36.9 C) (Oral)  Resp 17  Wt 136 lb 12.8 oz (62.052 kg)  SpO2 95%  Body mass index is 24.24 kg/(m^2).    alert and pleasant 1+  edema on the legs  ASSESSMENT/ PLAN:    Leg edema: This is relatively better but still continues to be a problem.  Since he tends to get over diuresed with Lasix daily she was told to take it only when she has significant edema but not more than twice a week.  Also continue  Lasix stockings.  Reminded her to try and reduce her Lyrica to once a to potentially avoid the swelling from this   Anemia of blood loss related to gastric ulcer in March along with B-12 deficiency This is stable now.  She does need to continue her iron and also B-12 supplement as well as Protonix. Last hemoglobin 11    Diabetes type 2   The patient's diabetes control appears to be overall better since her last visit and she is now getting some low normal or low readings in the afternoons She does not adjust her insulin based on what she is eating and is depending on her home aide to adjust her medications For now we'll reduce her Amaryl to half a tablet in the morning and continue half tablet in the evening also Reminded her to  call if she starts getting any low sugars She will continue 3 units of insulin before meals Also she will make sure she has some protein at each meal   RENAL insufficiency:  Overall stable, tends to be worse if she takes Lasix daily  Hyperlipidemia: She will have periodic labs done, to continue pravastatin  History of cerebellar infarct: She appears to have recovered and does not have any gait imbalance  Hypothyroidism: We will need  follow-up of her TSH before changing her medication, not clear why her TSH was low recently in the hospital  MEDICATION management: Her medication list and changes were.reviewed in detail with her and her home aide and discussed timing of medications, adjustment of doses, insulin and also glucose monitoring   Patient Instructions  Try Taking Lyrica once daily  May take fluid pill upto 2x per week only  Reduce Glimeperide to 1/2 in am also  Take insulin at lunch only if eating a sandwich, otherwie none  Add a protein like nuts, lean meat or cheese to breakfast    Counseling time on subjects discussed above is over 50% of today's 25 minute visit  [name] 07/01/2015, 4:57 PM

## 2015-07-02 IMAGING — CR DG CHEST 2V
2 series · 2 of 2 positions shown · non-contrast
Comparison: 03/02/2015 and prior radiographs dating back to
11/17/2009

CLINICAL DATA: [AGE] female with dizziness and near syncope.

EXAM:
CHEST  2 VIEW

[chest lat]
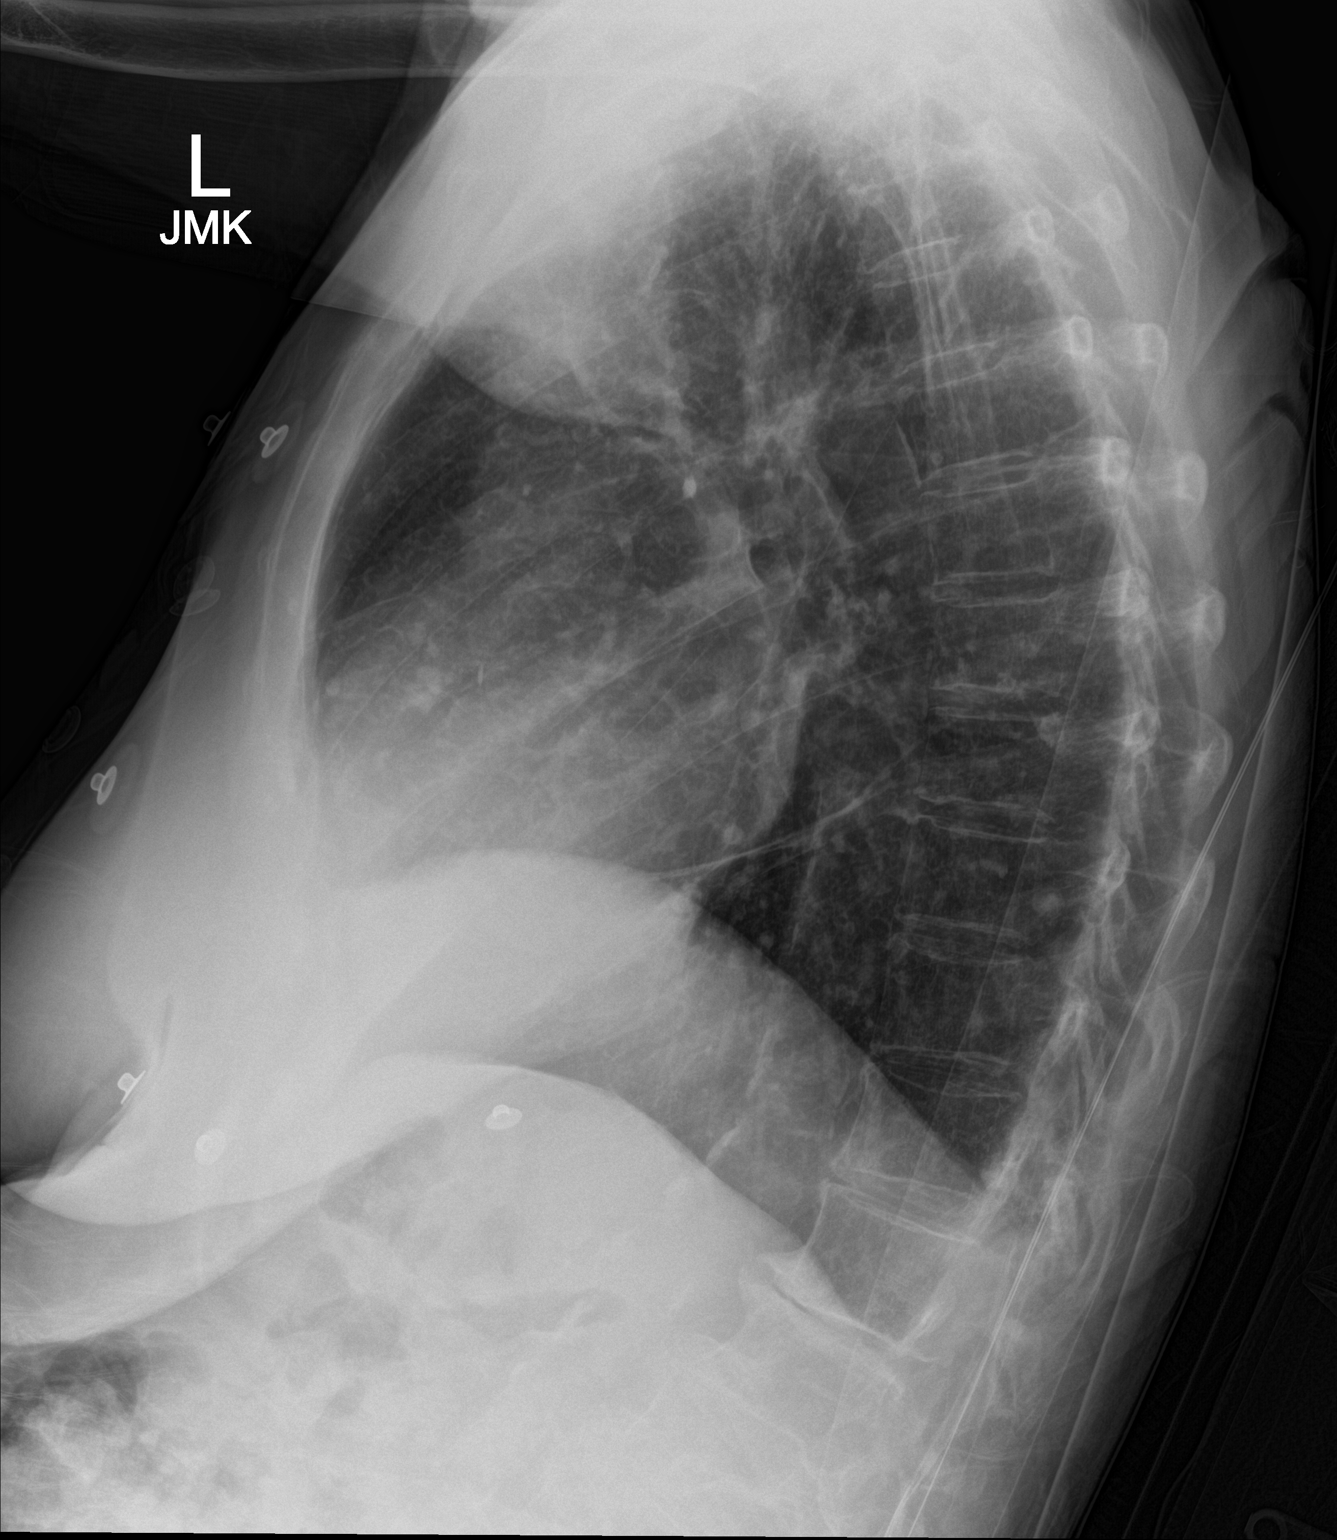

[chest ap]
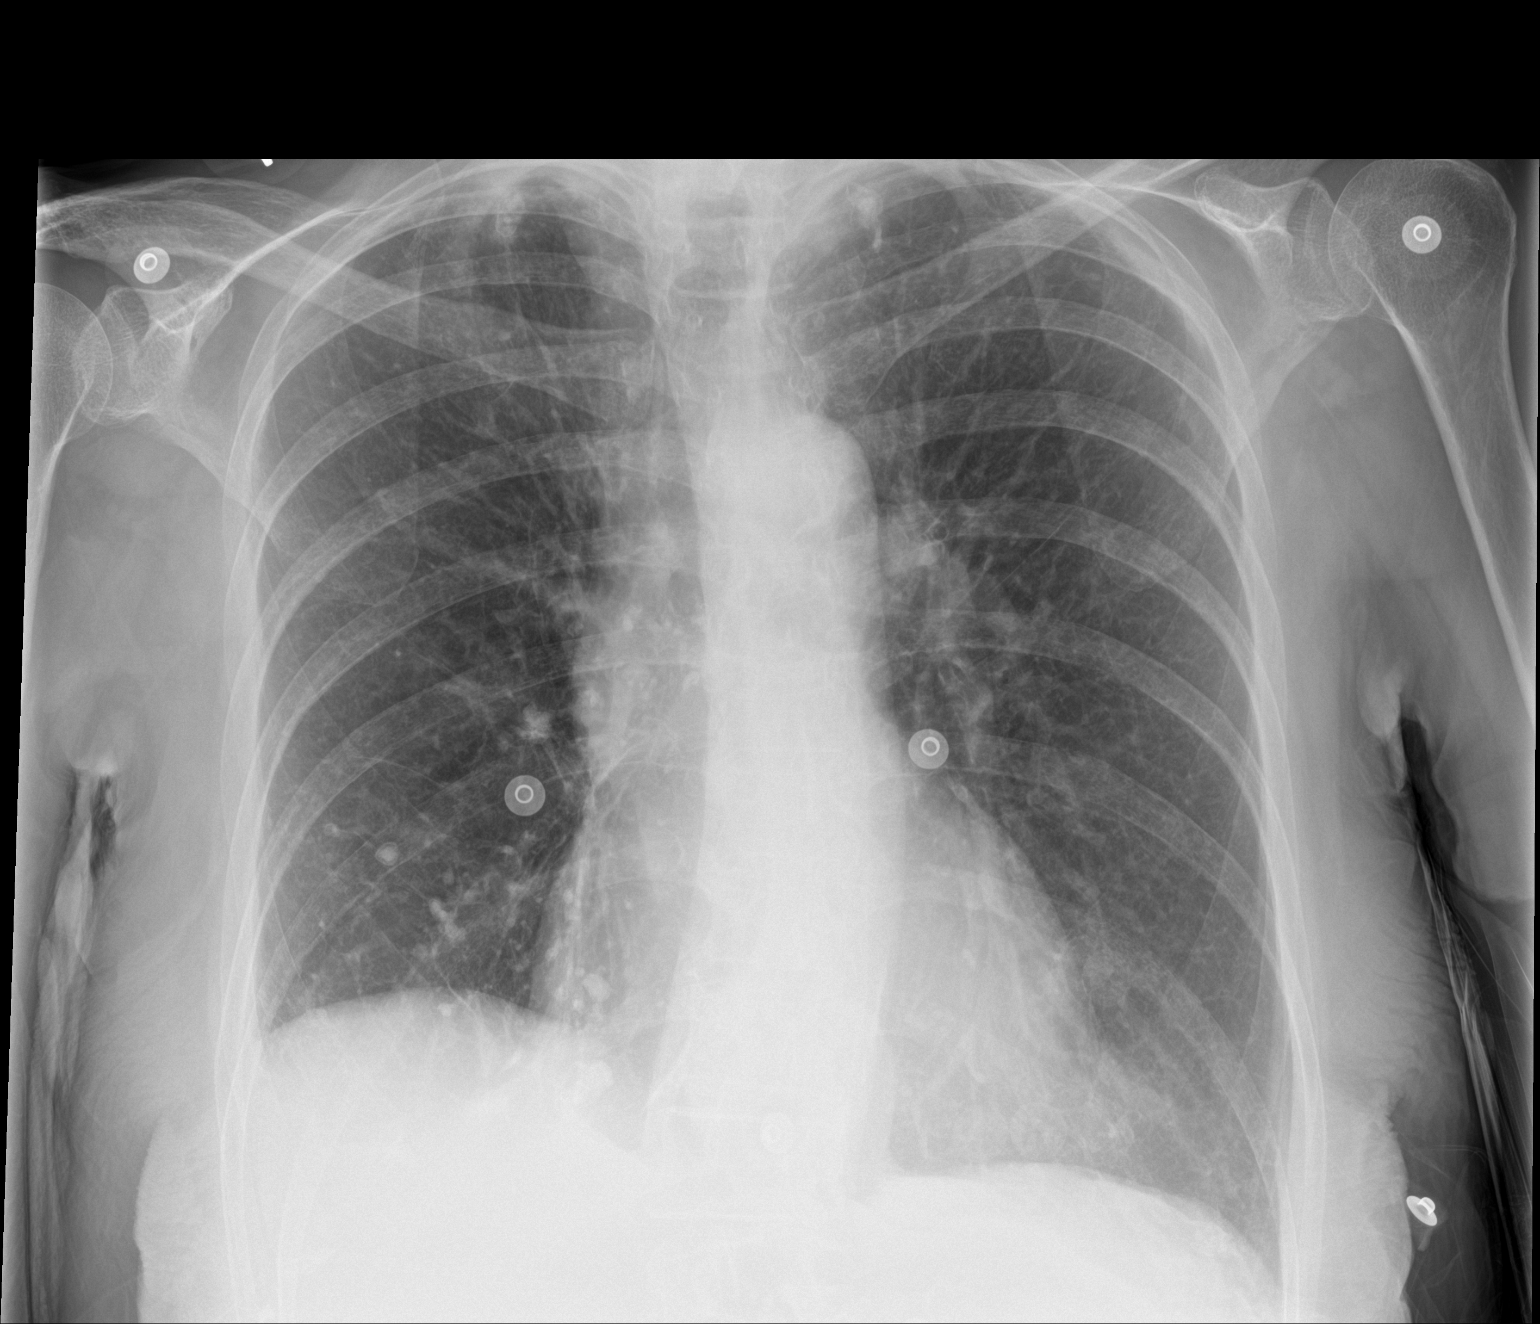

[2 of 2 positions shown; findings below may reference images not displayed]

FINDINGS: Upper limits normal heart size again noted.

Calcifications/granulomas and scarring in the right lower lung
noted.

There is no evidence of focal airspace disease, pulmonary edema,
suspicious pulmonary nodule/mass, pleural effusion, or pneumothorax.

No acute bony abnormalities are identified.
IMPRESSION: No active cardiopulmonary disease.

## 2015-07-02 IMAGING — CT CT HEAD W/O CM
2 series · 15 of 30 positions shown, 19 images · non-contrast
Comparison: MRI brain 03/03/2015.

CLINICAL DATA: Syncopal episode and dizziness. Recent history of
cerebellar infarct.

EXAM:
CT HEAD WITHOUT CONTRAST
TECHNIQUE: Contiguous axial images were obtained from the base of the skull
through the vertex without intravenous contrast.

[Series 201: head w/o, idose (1) · axial · non-contrast · 0.46mm/px · z∈[+50,+175]mm · 13 of 31 slices shown, 17 images]
[im 3/31  brain]
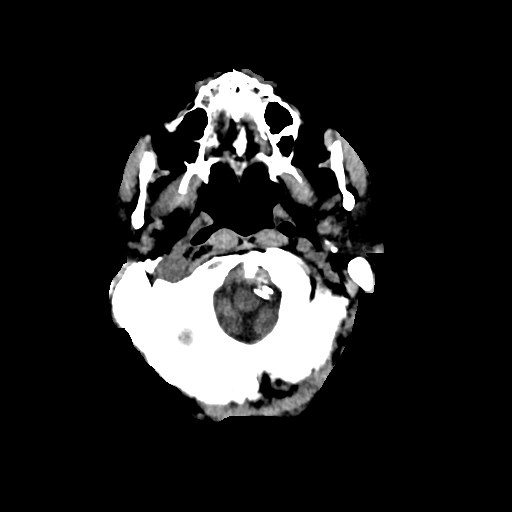
[im 3/31  bone]
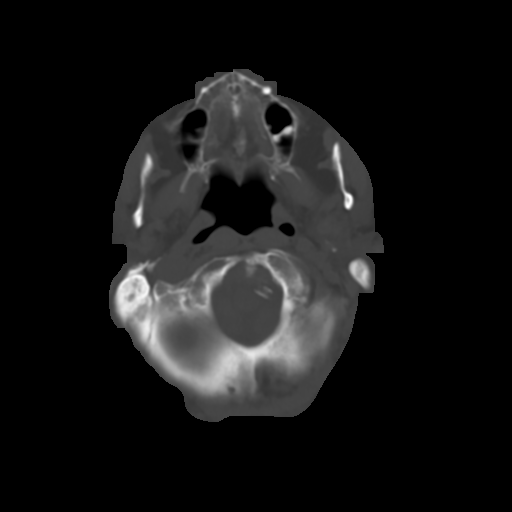
[im 5/31  brain]
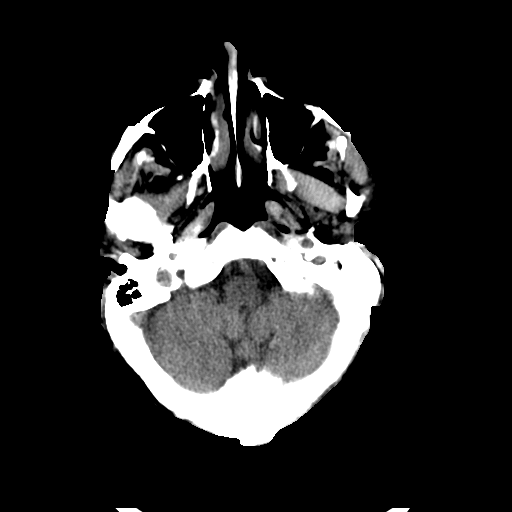
[im 7/31  brain]
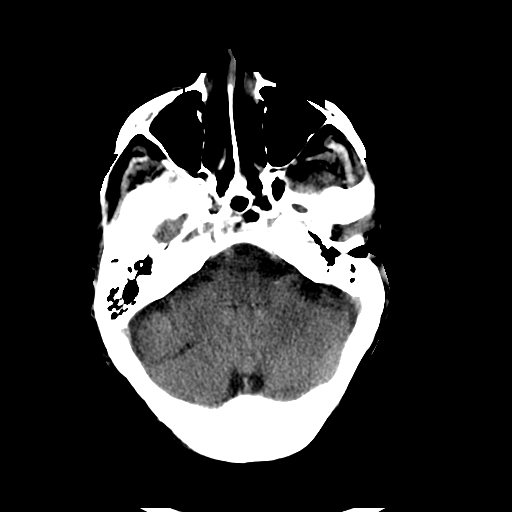
[im 9/31  brain]
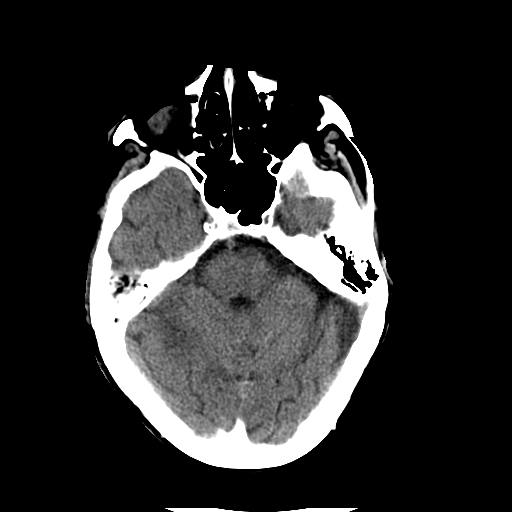
[im 11/31  brain]
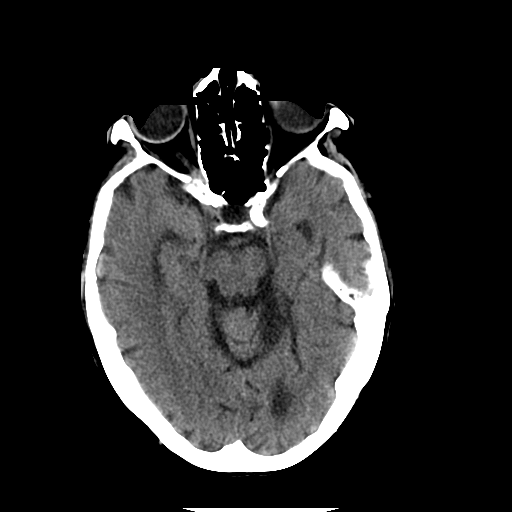
[im 11/31  bone]
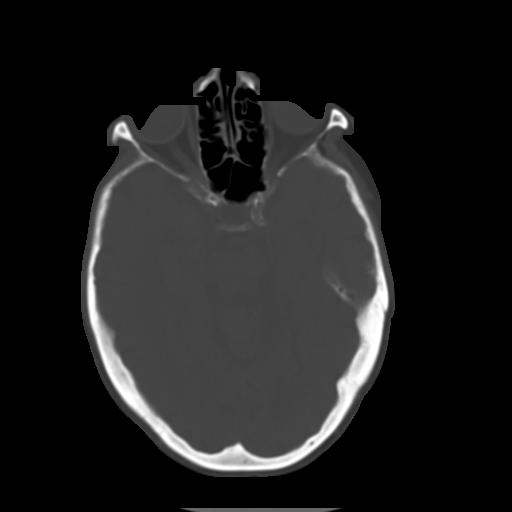
[im 13/31  brain]
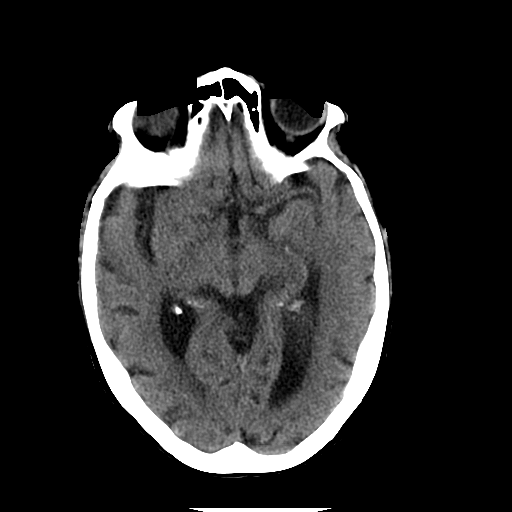
[im 16/31  brain]
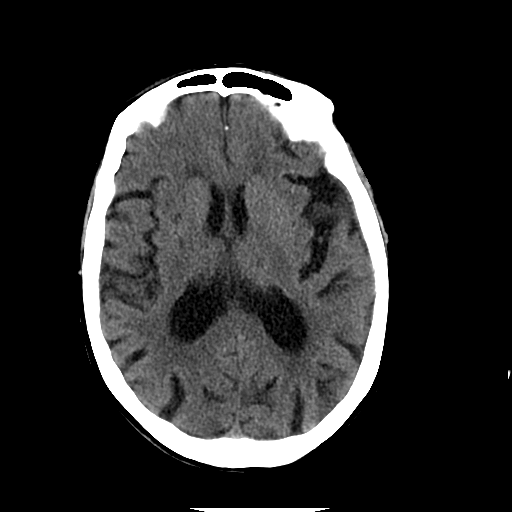
[im 18/31  brain]
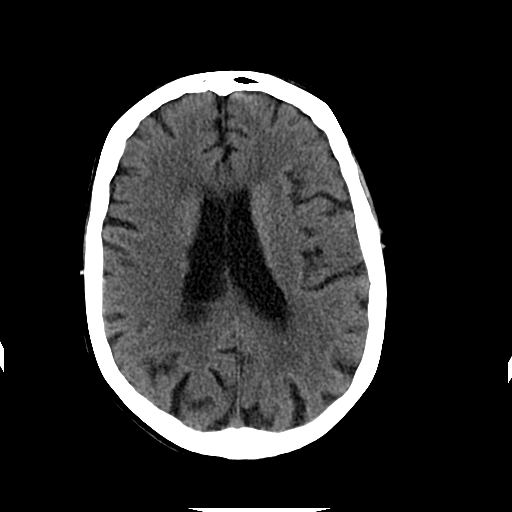
[im 20/31  brain]
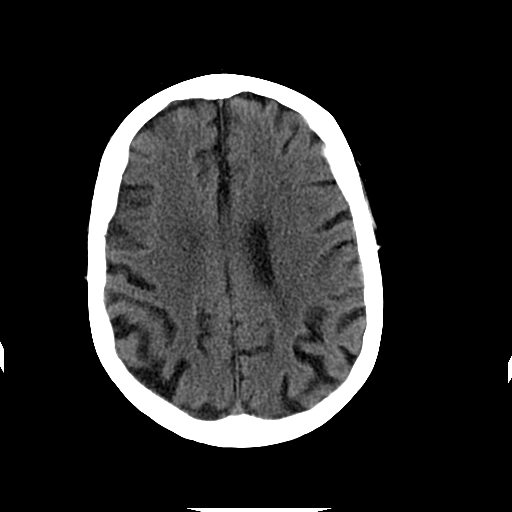
[im 20/31  bone]
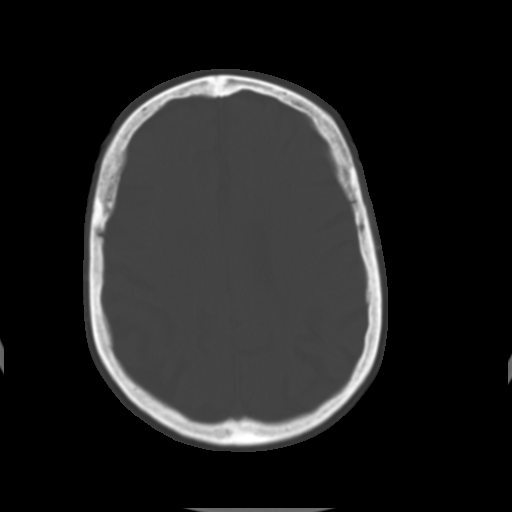
[im 22/31  brain]
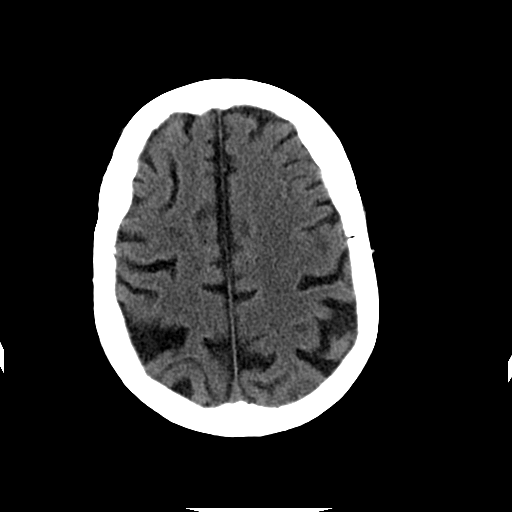
[im 24/31  brain]
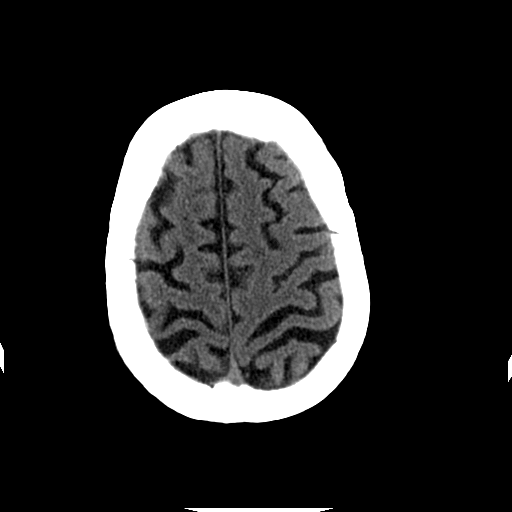
[im 26/31  brain]
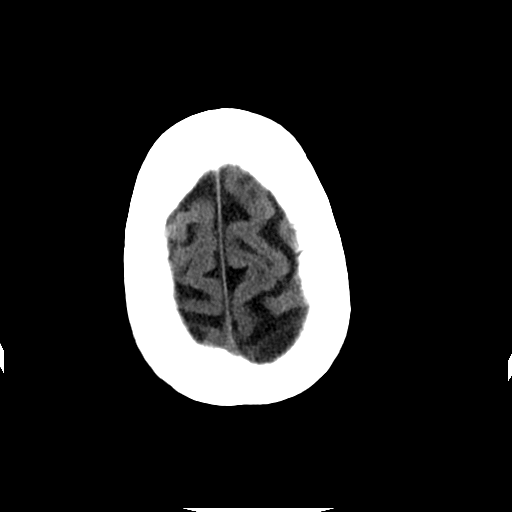
[im 28/31  brain]
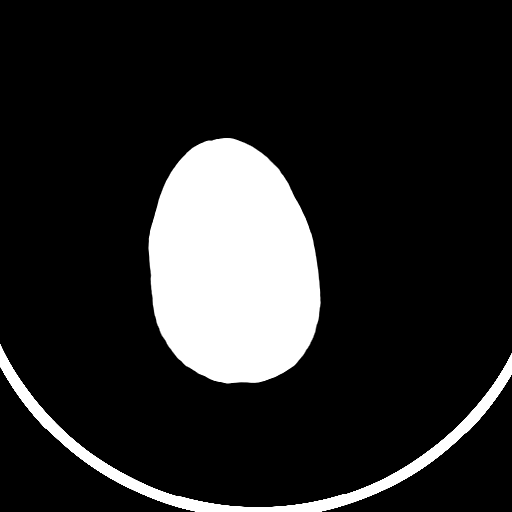
[im 28/31  bone]
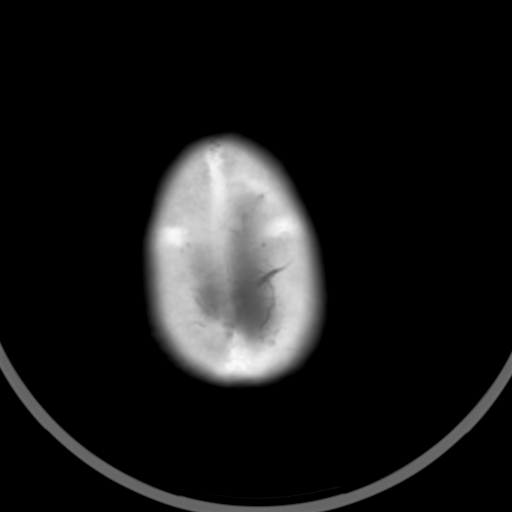

[Series 202: head w/o bone, idose (1) · axial · non-contrast · 0.46mm/px · z∈[+50,+70]mm · 2 of 31 slices shown]
[im 3/31  bone]
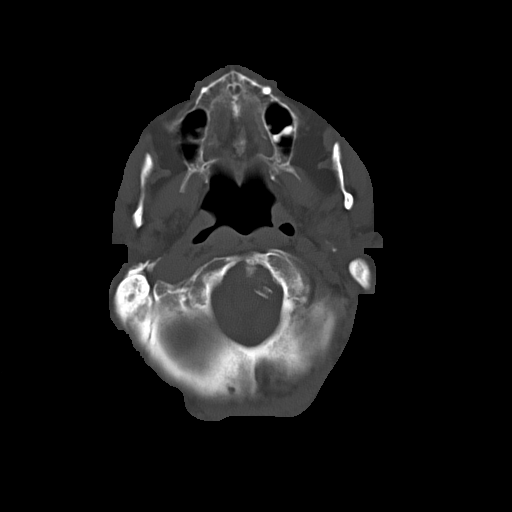
[im 7/31  bone]
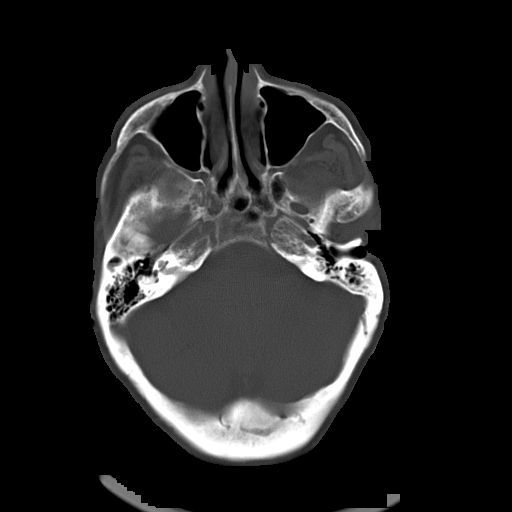

[15 of 30 positions shown; findings below may reference images not displayed]

FINDINGS: Expected evolution of the right cerebellar infarct is noted. Remote
lacunar infarcts are again noted within the basal ganglia and
centrum semiovale, more prominent on the right.

No acute cortical infarct, hemorrhage, or mass lesion is present.
The no definite new cerebellar infarct is seen.

Atherosclerotic calcifications are again noted within the left
vertebral artery in bilateral cavernous internal carotid arteries.

The paranasal sinuses and mastoid air cells are clear.

Bilateral lens replacements are noted. The globes and orbits are
otherwise intact.
IMPRESSION: 1. Affected evolution of right cerebellar infarct.
2. No definite new infarct is seen.
3. Remote lacunar infarcts of the basal ganglia and centrum
semiovale bilaterally.

## 2015-07-12 ENCOUNTER — Other Ambulatory Visit: Payer: Self-pay | Admitting: Endocrinology

## 2015-07-16 ENCOUNTER — Other Ambulatory Visit: Payer: Self-pay | Admitting: Endocrinology

## 2015-07-19 NOTE — Progress Notes (Addendum)
Physical Therapy Addendum for G-Codes    2015/06/05 1018  PT G-Codes **NOT FOR INPATIENT CLASS**  Functional Assessment Tool Used Clinical Judgement  Functional Limitation Mobility: Walking and moving around  Mobility: Walking and Moving Around Current Status [phone]) CI  Mobility: Walking and Moving Around Goal Status (D5520) CI    Rolinda Roan, PT, DPT Acute Rehabilitation Services Pager: [phone]

## 2015-08-04 ENCOUNTER — Ambulatory Visit: Payer: Commercial Managed Care - HMO | Admitting: Endocrinology

## 2015-08-07 ENCOUNTER — Telehealth: Payer: Self-pay | Admitting: Endocrinology

## 2015-08-07 ENCOUNTER — Other Ambulatory Visit: Payer: Self-pay | Admitting: Endocrinology

## 2015-08-07 ENCOUNTER — Other Ambulatory Visit: Payer: Self-pay | Admitting: *Deleted

## 2015-08-07 MED ORDER — FUROSEMIDE 20 MG PO TABS: ORAL_TABLET | ORAL | Status: DC

## 2015-08-07 NOTE — Telephone Encounter (Signed)
Please call in furosemide to [facility] on [address]

## 2015-08-07 NOTE — Telephone Encounter (Signed)
Add to refill tramadol as well please

## 2015-08-15 ENCOUNTER — Ambulatory Visit: Payer: Commercial Managed Care - HMO | Admitting: Endocrinology

## 2015-08-18 ENCOUNTER — Other Ambulatory Visit: Payer: Self-pay | Admitting: Endocrinology

## 2015-08-21 ENCOUNTER — Ambulatory Visit (INDEPENDENT_AMBULATORY_CARE_PROVIDER_SITE_OTHER): Payer: Commercial Managed Care - HMO | Admitting: Endocrinology

## 2015-08-21 ENCOUNTER — Encounter: Payer: Self-pay | Admitting: Endocrinology

## 2015-08-21 VITALS — BP 132/72 | HR 57 | Temp 97.6°F | Wt 138.2 lb

## 2015-08-21 DIAGNOSIS — D510 Vitamin B12 deficiency anemia due to intrinsic factor deficiency: Secondary | ICD-10-CM

## 2015-08-21 DIAGNOSIS — E038 Other specified hypothyroidism: Secondary | ICD-10-CM

## 2015-08-21 DIAGNOSIS — E1165 Type 2 diabetes mellitus with hyperglycemia: Secondary | ICD-10-CM

## 2015-08-21 DIAGNOSIS — IMO0002 Reserved for concepts with insufficient information to code with codable children: Secondary | ICD-10-CM

## 2015-08-21 DIAGNOSIS — R600 Localized edema: Secondary | ICD-10-CM

## 2015-08-21 DIAGNOSIS — N289 Disorder of kidney and ureter, unspecified: Secondary | ICD-10-CM | POA: Diagnosis not present

## 2015-08-21 DIAGNOSIS — E063 Autoimmune thyroiditis: Secondary | ICD-10-CM

## 2015-08-21 NOTE — Progress Notes (Signed)
Patient ID: [name], female   DOB: Feb 21, 1915, 81 y.o.   MRN: [id number]   Reason for Appointment: Follow-up of leg swelling and chronic issues  History of Present Illness   1.  Anemia:  She was admitted in March for bleeding most likely from a gastric ulcer and hemoglobin was down to about 8.5 requiring admission and transfusion Has  been taking iron and also vitamin B-12, had a documented low B-12 level Hemoglobin is improved and stable   She is continuing Protonix  Lab Results  Component Value Date   HGB 12.3 08/21/2015   HGB 11.0* 06/08/2015   HGB 10.4* 05/26/2015   HGB 11.1* 05/25/2015    2.  Type 2 DIABETES MELITUS, long-standing  She has been on low dose NovoLog at mealtimes along with Amaryl once a day for several years with fairly good control She does tend to have some postprandial hyperglycemia based on her diet  Since 2015 she has been taking a half tablet of Amaryl at dinnertime along with 2 mg in the morning also which has helped her fasting readings to be consistent  Recent history:  Insulin doses: 3 units of NovoLog insulin before each meal   She is taking mealtime insulin along with Amaryl; currently is being given her medications by her caregiver and she does not know what dose of Amaryl she is taking. She may occasionally take 4 units Novolog when she is eating a large meal  Blood sugars not available for review, patient thinks that her blood sugars are well controlled   Wt Readings from Last 3 Encounters:  08/21/15 138 lb 4 oz (62.71 kg)  06/30/15 136 lb 12.8 oz (62.052 kg)  06/08/15 137 lb (62.143 kg)   Lab Results  Component Value Date   HGBA1C 7.8* 06/08/2015   HGBA1C 8.4* 05/25/2015   HGBA1C 7.5* 04/10/2015   Lab Results  Component Value Date   MICROALBUR 3.3* 12/12/2014   LDLCALC 68 08/21/2015   CREATININE 1.69* 08/21/2015    3.  EDEMA: She has had edema of her lower legs Is trying to wear elastic stockings as  directed but still tends to have swelling. She is not able to manage her medications on her own well and since her nursing aide was not present in the last 10 days she has not taken any Lasix Was told to take this about twice a week as needed Daily Lasix was stopped because of tendency to worsening renal function especially when she was hospitalized  She thinks her swelling has been variable and relatively better today      Medication List       This list is accurate as of: 08/21/15 11:59 PM.  Always use your most recent med list.               ACCU-CHEK AVIVA PLUS test strip  Generic drug:  glucose blood  CHECK BLOOD SUGAR THREE TIMES DAILY     ACCU-CHEK FASTCLIX LANCET Kit  USE AS DIRECTED FOR TESTING     ACCU-CHEK FASTCLIX LANCETS Misc     aspirin 81 MG tablet  Take 81 mg by mouth daily.     CALTRATE 600+D 600-400 MG-UNIT per tablet  Generic drug:  Calcium Carbonate-Vitamin D  Take 1 tablet by mouth daily.     CENTRUM SILVER PO  Take 1 tablet by mouth.     ferrous sulfate 325 (65 FE) MG tablet  Take 325 mg by mouth daily with breakfast.  furosemide 20 MG tablet  Commonly known as:  LASIX  TAKE 1 TABLET BY MOUTH AS NEEDED     glimepiride 2 MG tablet  Commonly known as:  AMARYL  TAKE 1 TABLET BY MOUTH EVERY MORNING AND 1/2 TABLET AT DINNER     insulin aspart 100 UNIT/ML FlexPen  Commonly known as:  NOVOLOG FLEXPEN  If blood sugar is over 200, take 5 units and if they go over 300, take 6 units at that meal.     Insulin Pen Needle 32G X 6 MM Misc  Commonly known as:  NOVOFINE  Use as directed     LYRICA 75 MG capsule  Generic drug:  pregabalin  TAKE 1 CAPSULE TWICE DAILY     pantoprazole 40 MG tablet  Commonly known as:  PROTONIX  TAKE 1 TABLET BY MOUTH TWICE DAILY     pravastatin 20 MG tablet  Commonly known as:  PRAVACHOL  Take 20 mg by mouth daily.     SYNTHROID 88 MCG tablet  Generic drug:  levothyroxine  TAKE 1 TABLET DAILY     traMADol 50  MG tablet  Commonly known as:  ULTRAM  TAKE 1 TABLET BY MOUTH TWICE DAILY     vitamin B-12 1000 MCG tablet  Commonly known as:  CYANOCOBALAMIN  Take 1,000 mcg by mouth daily.        Allergies:  Allergies  Allergen Reactions  . Other     Ground red pepper lotion to rub on her knee caused SOB    Past Medical History  Diagnosis Date  . Hypothyroidism   . Tuberculosis 1938    During the Great Depression  . Migraines     "a long long time ago"  . Arthritis     "a little; in my joints" (03/02/2015)  . Hiatal hernia   . Cameron ulcer   . Diabetic neuropathy   . Chronic kidney disease     stage 3  . Acute blood loss anemia   . Acute ischemic stroke   . Cerebellar stroke   . Type II diabetes mellitus     insulin dependent    Past Surgical History  Procedure Laterality Date  . Hip fracture surgery Bilateral   . Fracture surgery    . Tonsillectomy    . Cataract extraction w/ intraocular lens  implant, bilateral Bilateral   . Esophagogastroduodenoscopy N/A 03/03/2015    Procedure: ESOPHAGOGASTRODUODENOSCOPY (EGD);  Surgeon: Inda Castle, MD;  Location: [facility];  Service: Endoscopy;  Laterality: N/A;  . Esophagogastroduodenoscopy N/A 03/07/2015    Procedure: ESOPHAGOGASTRODUODENOSCOPY (EGD);  Surgeon: Jerene Bears, MD;  Location: [facility] ENDOSCOPY;  Service: Endoscopy;  Laterality: N/A;    Family History  Problem Relation Age of Onset  . Diabetes Father     Social History:  reports that she has never smoked. She has never used smokeless tobacco. She reports that she drinks alcohol. She reports that she does not use illicit drugs.  Review of Systems:  Lab Results  Component Value Date   WBC 7.5 08/21/2015   HGB 12.3 08/21/2015   HCT 37.2 08/21/2015   MCV 92.6 08/21/2015   PLT 133.0* 08/21/2015      HYPOTHYROIDISM:  She has been on thyroid supplement long-term, currently taking 88 mcg, 5/7 days a week  and is compliant with this, using her pillbox for dispensing  her medications (weekly dosage equals 62 qd) Dosage was not changed on her last visit  thyroid levels are as follows:  Lab Results  Component Value Date   TSH 4.94* 08/21/2015   TSH 0.86 06/08/2015   TSH 0.204* 05/25/2015   FREET4 0.99 08/21/2015   FREET4 1.50* 05/25/2015   FREET4 1.19 08/15/2014     LIPIDS: She  has been taking  pravastatin, previously had been on Lipitor. She is tolerating  this quite well, lipids have been excellent   Lab Results  Component Value Date   CHOL 160 08/21/2015   HDL 71.80 08/21/2015   LDLCALC 68 08/21/2015   TRIG 105.0 08/21/2015   CHOLHDL 2 08/21/2015    NEUROPATHY: She has had long-standing neuropathy with numbness and paresthesiae, on Lyrica twice a day with relief.   She was told to reduce Lyrica to once a day because of her edema but she thinks she is still taking it twice a  She also takes Ultram with her Lyrica as needed    Diabetic foot exam in 11/15 showed decreased monofilament sensation in the toes and plantar surfaces   Mild chronic kidney disease: This is likely related to nephrosclerosis and creatinine is variable but relatively stable  Lab Results  Component Value Date   CREATININE 1.69* 08/21/2015    She is having issues with her memory and this appears to be somewhat progressive  Currently using a home aide for her various needs     Cerebellar Infarct: She had this during her hospitalization this year and is unable to take any aspirin or Plavix because of GI bleeding She thinks her balance is reasonably good and uses a walker consistently, no recent falls    Examination:   BP 132/72 mmHg  Pulse 57  Temp(Src) 97.6 F (36.4 C) (Oral)  Wt 138 lb 4 oz (62.71 kg)  SpO2 97%  Body mass index is 24.5 kg/(m^2).    alert and pleasant 1+  edema on the legs  ASSESSMENT/ PLAN:    Leg edema: This is relatively better but persistent.  She has been reluctant to take Lasix much because of frequent urination and also  difficulty remembering to take it on schedule to 3 times a week.  For now will have her take 10 mg every other day    Anemia of blood loss related to gastric ulcer in March along with B-12 deficiency Will need to check her CBC today  She does need to continue her iron and also B-12 supplement as well as Protonix.     Diabetes type 2   The patient's diabetes control will need to be assessed on her next visit, did not bring her monitor for review   Hypothyroidism: We will need  follow-up of her TSH before changing her medication, currently does not complain of any unusual fatigue MEDICATION management: Her medication list and changes were.reviewed in detail with her and her home aide   Patient Instructions  Take 1/2 pill every 2 days of 58m furosemide          Dashanna Kinnamon 08/22/2015, 1:06 PM     Addendum: TSH minimally increased at 4.9, considering her age this should be adequate and she is asymptomatic

## 2015-08-21 NOTE — Progress Notes (Signed)
Pre visit review using our clinic review tool, if applicable. No additional management support is needed unless otherwise documented below in the visit note. 

## 2015-08-21 NOTE — Patient Instructions (Signed)
Take 1/2 pill every 2 days of 20mg  furosemide

## 2015-08-22 LAB — CBC
HCT: 37.2 % (ref 36.0–46.0)
HEMOGLOBIN: 12.3 g/dL (ref 12.0–15.0)
MCHC: 33 g/dL (ref 30.0–36.0)
MCV: 92.6 fl (ref 78.0–100.0)
Platelets: 133 10*3/uL — ABNORMAL LOW (ref 150.0–400.0)
RBC: 4.02 Mil/uL (ref 3.87–5.11)
RDW: 15.5 % (ref 11.5–15.5)
WBC: 7.5 10*3/uL (ref 4.0–10.5)

## 2015-08-22 LAB — T4, FREE: Free T4: 0.99 ng/dL (ref 0.60–1.60)

## 2015-08-22 LAB — LIPID PANEL
CHOL/HDL RATIO: 2
Cholesterol: 160 mg/dL (ref 0–200)
HDL: 71.8 mg/dL (ref >39.00)
LDL Cholesterol: 68 mg/dL (ref 0–99)
NONHDL: 88.51
Triglycerides: 105 mg/dL (ref 0.0–149.0)
VLDL: 21 mg/dL (ref 0.0–40.0)

## 2015-08-22 LAB — BASIC METABOLIC PANEL
BUN: 28 mg/dL — AB (ref 6–23)
CO2: 26 mEq/L (ref 19–32)
Calcium: 9.4 mg/dL (ref 8.4–10.5)
Chloride: 106 mEq/L (ref 96–112)
Creatinine, Ser: 1.69 mg/dL — ABNORMAL HIGH (ref 0.40–1.20)
GFR: 29.7 mL/min — ABNORMAL LOW (ref >60.00)
GLUCOSE: 167 mg/dL — AB (ref 70–99)
POTASSIUM: 4.7 meq/L (ref 3.5–5.1)
Sodium: 140 mEq/L (ref 135–145)

## 2015-08-22 LAB — TSH: TSH: 4.94 u[IU]/mL — AB (ref 0.35–4.50)

## 2015-08-28 ENCOUNTER — Ambulatory Visit: Payer: Commercial Managed Care - HMO | Admitting: Podiatry

## 2015-09-01 ENCOUNTER — Ambulatory Visit: Payer: Commercial Managed Care - HMO | Admitting: Podiatry

## 2015-09-08 ENCOUNTER — Other Ambulatory Visit: Payer: Self-pay | Admitting: Adult Health

## 2015-09-08 ENCOUNTER — Other Ambulatory Visit: Payer: Self-pay | Admitting: Endocrinology

## 2015-09-11 ENCOUNTER — Ambulatory Visit (INDEPENDENT_AMBULATORY_CARE_PROVIDER_SITE_OTHER): Payer: Commercial Managed Care - HMO | Admitting: Podiatry

## 2015-09-11 ENCOUNTER — Encounter: Payer: Self-pay | Admitting: Podiatry

## 2015-09-11 VITALS — BP 131/52 | HR 70 | Resp 18

## 2015-09-11 DIAGNOSIS — B351 Tinea unguium: Secondary | ICD-10-CM

## 2015-09-11 DIAGNOSIS — M2010 Hallux valgus (acquired), unspecified foot: Secondary | ICD-10-CM | POA: Diagnosis not present

## 2015-09-11 DIAGNOSIS — L84 Corns and callosities: Secondary | ICD-10-CM | POA: Diagnosis not present

## 2015-09-11 DIAGNOSIS — E1140 Type 2 diabetes mellitus with diabetic neuropathy, unspecified: Secondary | ICD-10-CM

## 2015-09-11 DIAGNOSIS — M79676 Pain in unspecified toe(s): Secondary | ICD-10-CM

## 2015-09-11 DIAGNOSIS — E1149 Type 2 diabetes mellitus with other diabetic neurological complication: Secondary | ICD-10-CM

## 2015-09-11 NOTE — Progress Notes (Signed)
Patient ID: [name], female   DOB: Sep 08, 1915, 80 y.o.   MRN: [id number]  Subjective: 81 y.o. returns the office today for painful, elongated, thickened toenails which she is unable to trim herself.  She will that she is also getting ingrown toenail  To the big toenails. Denies any redness or drainage around the nails. Denies any acute changes since last appointment and no new complaints today. Denies any systemic complaints such as fevers, chills, nausea, vomiting.   Objective: AAO 3, NAD DP/PT pulses palpable, CRT less than 3 seconds Protective sensation decreased with Simms Weinstein monofilament, Achilles tendon reflex intact.  Nails hypertrophic, dystrophic, elongated, brittle, discolored 10. There is ingrowing on both the medial and lateral aspects of bilateral hallux toenail as well as second digit. There is tenderness overlying the nails 1-5 bilaterally. There is no surrounding erythema or drainage along the nail sites. Significant left HAV deformity present with overlying hyperkeratotic lesion off the medial aspect of the first metatarsal head. Upon debridement there was no underlying ulceration, drainage or other signs of infection. There is slight erythema due to irritation in shoe gear, no increase  In warmth or ascending saline as. No open lesions or  otherpre-ulcerative lesions are identified. No other areas of tenderness bilateral lower extremities. No overlying edema, erythema, increased warmth. No pain with calf compression, swelling, warmth, erythema.  Assessment: Patient presents with symptomatic onychomycosis/ingrown toenail  Plan: -Treatment options including alternatives, risks, complications were discussed -Nails sharply debrided 10 without complication/bleeding. - hyperkeratotic lesion sharply debrided 1 without complication/clean. Dispensed offloading pad. -Discussed daily foot inspection. If there are any changes, to call the office immediately.  -Follow-up  in 3 months or sooner if any problems are to arise. In the meantime, encouraged to call the office with any questions, concerns, changes symptoms.  Celesta Gentile, DPM

## 2015-09-12 ENCOUNTER — Other Ambulatory Visit: Payer: Self-pay | Admitting: Endocrinology

## 2015-09-15 ENCOUNTER — Other Ambulatory Visit: Payer: Self-pay | Admitting: *Deleted

## 2015-09-15 MED ORDER — TRAMADOL HCL 50 MG PO TABS: 50.0000 mg | ORAL_TABLET | Freq: Two times a day (BID) | ORAL | Status: AC

## 2015-10-02 DIAGNOSIS — Z6824 Body mass index (BMI) 24.0-24.9, adult: Secondary | ICD-10-CM | POA: Diagnosis not present

## 2015-10-02 DIAGNOSIS — K5900 Constipation, unspecified: Secondary | ICD-10-CM | POA: Diagnosis not present

## 2015-10-02 DIAGNOSIS — E785 Hyperlipidemia, unspecified: Secondary | ICD-10-CM | POA: Diagnosis not present

## 2015-10-02 DIAGNOSIS — E538 Deficiency of other specified B group vitamins: Secondary | ICD-10-CM | POA: Diagnosis not present

## 2015-10-02 DIAGNOSIS — E119 Type 2 diabetes mellitus without complications: Secondary | ICD-10-CM | POA: Diagnosis not present

## 2015-10-02 DIAGNOSIS — N183 Chronic kidney disease, stage 3 (moderate): Secondary | ICD-10-CM | POA: Diagnosis not present

## 2015-10-02 DIAGNOSIS — M545 Low back pain: Secondary | ICD-10-CM | POA: Diagnosis not present

## 2015-10-02 DIAGNOSIS — E039 Hypothyroidism, unspecified: Secondary | ICD-10-CM | POA: Diagnosis not present

## 2015-10-20 ENCOUNTER — Ambulatory Visit: Payer: Commercial Managed Care - HMO | Admitting: Endocrinology

## 2015-12-06 ENCOUNTER — Emergency Department (HOSPITAL_COMMUNITY): Payer: Commercial Managed Care - HMO

## 2015-12-06 ENCOUNTER — Inpatient Hospital Stay (HOSPITAL_COMMUNITY)
Admission: EM | Admit: 2015-12-06 | Discharge: 2015-12-09 | DRG: 871 | Disposition: A | Discharge status: Home Health | Payer: Commercial Managed Care - HMO | Attending: Internal Medicine | Admitting: Internal Medicine

## 2015-12-06 ENCOUNTER — Encounter (HOSPITAL_COMMUNITY): Payer: Self-pay | Admitting: Emergency Medicine

## 2015-12-06 DIAGNOSIS — D696 Thrombocytopenia, unspecified: Secondary | ICD-10-CM | POA: Diagnosis present

## 2015-12-06 DIAGNOSIS — Z9841 Cataract extraction status, right eye: Secondary | ICD-10-CM

## 2015-12-06 DIAGNOSIS — Z961 Presence of intraocular lens: Secondary | ICD-10-CM | POA: Diagnosis present

## 2015-12-06 DIAGNOSIS — E785 Hyperlipidemia, unspecified: Secondary | ICD-10-CM | POA: Diagnosis present

## 2015-12-06 DIAGNOSIS — I129 Hypertensive chronic kidney disease with stage 1 through stage 4 chronic kidney disease, or unspecified chronic kidney disease: Secondary | ICD-10-CM | POA: Diagnosis present

## 2015-12-06 DIAGNOSIS — R739 Hyperglycemia, unspecified: Secondary | ICD-10-CM

## 2015-12-06 DIAGNOSIS — Z79899 Other long term (current) drug therapy: Secondary | ICD-10-CM

## 2015-12-06 DIAGNOSIS — J441 Chronic obstructive pulmonary disease with (acute) exacerbation: Secondary | ICD-10-CM | POA: Diagnosis present

## 2015-12-06 DIAGNOSIS — Z794 Long term (current) use of insulin: Secondary | ICD-10-CM | POA: Diagnosis not present

## 2015-12-06 DIAGNOSIS — Z8673 Personal history of transient ischemic attack (TIA), and cerebral infarction without residual deficits: Secondary | ICD-10-CM

## 2015-12-06 DIAGNOSIS — J440 Chronic obstructive pulmonary disease with acute lower respiratory infection: Secondary | ICD-10-CM | POA: Diagnosis not present

## 2015-12-06 DIAGNOSIS — E1122 Type 2 diabetes mellitus with diabetic chronic kidney disease: Secondary | ICD-10-CM | POA: Diagnosis present

## 2015-12-06 DIAGNOSIS — R0602 Shortness of breath: Secondary | ICD-10-CM | POA: Diagnosis present

## 2015-12-06 DIAGNOSIS — J9601 Acute respiratory failure with hypoxia: Secondary | ICD-10-CM | POA: Diagnosis present

## 2015-12-06 DIAGNOSIS — J189 Pneumonia, unspecified organism: Secondary | ICD-10-CM | POA: Diagnosis not present

## 2015-12-06 DIAGNOSIS — N183 Chronic kidney disease, stage 3 unspecified: Secondary | ICD-10-CM | POA: Diagnosis present

## 2015-12-06 DIAGNOSIS — K5900 Constipation, unspecified: Secondary | ICD-10-CM | POA: Diagnosis present

## 2015-12-06 DIAGNOSIS — A419 Sepsis, unspecified organism: Secondary | ICD-10-CM | POA: Diagnosis not present

## 2015-12-06 DIAGNOSIS — E1165 Type 2 diabetes mellitus with hyperglycemia: Secondary | ICD-10-CM | POA: Diagnosis present

## 2015-12-06 DIAGNOSIS — E1140 Type 2 diabetes mellitus with diabetic neuropathy, unspecified: Secondary | ICD-10-CM | POA: Diagnosis present

## 2015-12-06 DIAGNOSIS — I639 Cerebral infarction, unspecified: Secondary | ICD-10-CM | POA: Diagnosis present

## 2015-12-06 DIAGNOSIS — Z833 Family history of diabetes mellitus: Secondary | ICD-10-CM | POA: Diagnosis not present

## 2015-12-06 DIAGNOSIS — Z7982 Long term (current) use of aspirin: Secondary | ICD-10-CM | POA: Diagnosis not present

## 2015-12-06 DIAGNOSIS — D72829 Elevated white blood cell count, unspecified: Secondary | ICD-10-CM | POA: Diagnosis present

## 2015-12-06 DIAGNOSIS — E038 Other specified hypothyroidism: Secondary | ICD-10-CM

## 2015-12-06 DIAGNOSIS — R05 Cough: Secondary | ICD-10-CM | POA: Diagnosis not present

## 2015-12-06 DIAGNOSIS — Z9842 Cataract extraction status, left eye: Secondary | ICD-10-CM | POA: Diagnosis not present

## 2015-12-06 DIAGNOSIS — R6889 Other general symptoms and signs: Secondary | ICD-10-CM | POA: Diagnosis not present

## 2015-12-06 DIAGNOSIS — E1169 Type 2 diabetes mellitus with other specified complication: Secondary | ICD-10-CM

## 2015-12-06 DIAGNOSIS — E039 Hypothyroidism, unspecified: Secondary | ICD-10-CM | POA: Diagnosis present

## 2015-12-06 DIAGNOSIS — Z79891 Long term (current) use of opiate analgesic: Secondary | ICD-10-CM | POA: Diagnosis not present

## 2015-12-06 DIAGNOSIS — E0849 Diabetes mellitus due to underlying condition with other diabetic neurological complication: Secondary | ICD-10-CM

## 2015-12-06 LAB — COMPREHENSIVE METABOLIC PANEL
ALT: 16 U/L (ref 14–54)
AST: 27 U/L (ref 15–41)
Albumin: 3.6 g/dL (ref 3.5–5.0)
Alkaline Phosphatase: 71 U/L (ref 38–126)
Anion gap: 12 (ref 5–15)
BILIRUBIN TOTAL: 1.1 mg/dL (ref 0.3–1.2)
BUN: 31 mg/dL — AB (ref 6–20)
CHLORIDE: 97 mmol/L — AB (ref 101–111)
CO2: 25 mmol/L (ref 22–32)
CREATININE: 1.44 mg/dL — AB (ref 0.44–1.00)
Calcium: 9.1 mg/dL (ref 8.9–10.3)
GFR calc Af Amer: 34 mL/min — ABNORMAL LOW (ref >60)
GFR, EST NON AFRICAN AMERICAN: 29 mL/min — AB (ref >60)
Glucose, Bld: 329 mg/dL — ABNORMAL HIGH (ref 65–99)
Potassium: 4.9 mmol/L (ref 3.5–5.1)
Sodium: 134 mmol/L — ABNORMAL LOW (ref 135–145)
TOTAL PROTEIN: 6.7 g/dL (ref 6.5–8.1)

## 2015-12-06 LAB — CBC WITH DIFFERENTIAL/PLATELET
Basophils Absolute: 0 10*3/uL (ref 0.0–0.1)
Basophils Relative: 0 %
EOS ABS: 0 10*3/uL (ref 0.0–0.7)
EOS PCT: 0 %
HCT: 39.6 % (ref 36.0–46.0)
Hemoglobin: 13.2 g/dL (ref 12.0–15.0)
Lymphocytes Relative: 9 %
Lymphs Abs: 1.7 10*3/uL (ref 0.7–4.0)
MCH: 32.3 pg (ref 26.0–34.0)
MCHC: 33.3 g/dL (ref 30.0–36.0)
MCV: 96.8 fL (ref 78.0–100.0)
MONO ABS: 1.6 10*3/uL — AB (ref 0.1–1.0)
Monocytes Relative: 8 %
Neutro Abs: 16 10*3/uL — ABNORMAL HIGH (ref 1.7–7.7)
Neutrophils Relative %: 83 %
PLATELETS: 146 10*3/uL — AB (ref 150–400)
RBC: 4.09 MIL/uL (ref 3.87–5.11)
RDW: 12.6 % (ref 11.5–15.5)
WBC: 19.3 10*3/uL — AB (ref 4.0–10.5)

## 2015-12-06 LAB — GLUCOSE, CAPILLARY
GLUCOSE-CAPILLARY: 290 mg/dL — AB (ref 65–99)
Glucose-Capillary: 230 mg/dL — ABNORMAL HIGH (ref 65–99)

## 2015-12-06 LAB — MAGNESIUM: Magnesium: 2 mg/dL (ref 1.7–2.4)

## 2015-12-06 LAB — PHOSPHORUS: PHOSPHORUS: 2.7 mg/dL (ref 2.5–4.6)

## 2015-12-06 LAB — PROCALCITONIN: Procalcitonin: 0.91 ng/mL

## 2015-12-06 LAB — LACTIC ACID, PLASMA
LACTIC ACID, VENOUS: 1.5 mmol/L (ref 0.5–2.0)
Lactic Acid, Venous: 1.4 mmol/L (ref 0.5–2.0)

## 2015-12-06 LAB — I-STAT CG4 LACTIC ACID, ED: Lactic Acid, Venous: 1.99 mmol/L (ref 0.5–2.0)

## 2015-12-06 MED ORDER — LEVOTHYROXINE SODIUM 88 MCG PO TABS
88.0000 ug | ORAL_TABLET | Freq: Every day | ORAL | Status: DC
  Administered 2015-12-07 – 2015-12-09 (×3): 88 ug via ORAL
  Filled 2015-12-06 (×4): qty 1

## 2015-12-06 MED ORDER — GUAIFENESIN-CODEINE 100-10 MG/5ML PO SOLN: 5.0000 mL | Freq: Four times a day (QID) | ORAL | Status: DC | PRN

## 2015-12-06 MED ORDER — TRAMADOL HCL 50 MG PO TABS
50.0000 mg | ORAL_TABLET | Freq: Two times a day (BID) | ORAL | Status: DC
  Administered 2015-12-06 – 2015-12-09 (×6): 50 mg via ORAL
  Filled 2015-12-06 (×6): qty 1

## 2015-12-06 MED ORDER — DEXTROSE 5 % IV SOLN
1.0000 g | Freq: Once | INTRAVENOUS | Status: AC
  Administered 2015-12-06: 1 g via INTRAVENOUS
  Filled 2015-12-06: qty 10

## 2015-12-06 MED ORDER — FERROUS SULFATE 325 (65 FE) MG PO TABS
325.0000 mg | ORAL_TABLET | Freq: Every day | ORAL | Status: DC
  Administered 2015-12-07 – 2015-12-09 (×3): 325 mg via ORAL
  Filled 2015-12-06 (×4): qty 1

## 2015-12-06 MED ORDER — CALCIUM CARBONATE-VITAMIN D 500-200 MG-UNIT PO TABS
1.0000 | ORAL_TABLET | Freq: Every day | ORAL | Status: DC
Start: 2015-12-07 — End: 2015-12-09
  Administered 2015-12-07 – 2015-12-09 (×3): 1 via ORAL
  Filled 2015-12-06 (×3): qty 1

## 2015-12-06 MED ORDER — GLIMEPIRIDE 1 MG PO TABS
1.0000 mg | ORAL_TABLET | Freq: Every day | ORAL | Status: DC
  Administered 2015-12-07 – 2015-12-09 (×3): 1 mg via ORAL
  Filled 2015-12-06 (×4): qty 1

## 2015-12-06 MED ORDER — HYDRALAZINE HCL 20 MG/ML IJ SOLN: 5.0000 mg | Freq: Four times a day (QID) | INTRAMUSCULAR | Status: DC | PRN

## 2015-12-06 MED ORDER — PRAVASTATIN SODIUM 20 MG PO TABS
20.0000 mg | ORAL_TABLET | Freq: Every day | ORAL | Status: DC
  Administered 2015-12-07 – 2015-12-09 (×3): 20 mg via ORAL
  Filled 2015-12-06 (×3): qty 1

## 2015-12-06 MED ORDER — SODIUM CHLORIDE 0.9 % IV SOLN
INTRAVENOUS | Status: DC
  Administered 2015-12-06 – 2015-12-09 (×4): via INTRAVENOUS

## 2015-12-06 MED ORDER — DEXTROSE 5 % IV SOLN
1.0000 g | INTRAVENOUS | Status: DC
  Administered 2015-12-07 – 2015-12-09 (×3): 1 g via INTRAVENOUS
  Filled 2015-12-06 (×3): qty 10

## 2015-12-06 MED ORDER — DEXTROSE 5 % IV SOLN
500.0000 mg | INTRAVENOUS | Status: DC
  Administered 2015-12-07 – 2015-12-09 (×3): 500 mg via INTRAVENOUS
  Filled 2015-12-06 (×3): qty 500

## 2015-12-06 MED ORDER — ACETAMINOPHEN 325 MG PO TABS
650.0000 mg | ORAL_TABLET | Freq: Once | ORAL | Status: AC
  Administered 2015-12-06: 650 mg via ORAL
  Filled 2015-12-06: qty 2

## 2015-12-06 MED ORDER — VITAMIN B-12 1000 MCG PO TABS
1000.0000 ug | ORAL_TABLET | Freq: Every day | ORAL | Status: DC
  Administered 2015-12-07 – 2015-12-09 (×3): 1000 ug via ORAL
  Filled 2015-12-06 (×3): qty 1

## 2015-12-06 MED ORDER — SODIUM CHLORIDE 0.9 % IV BOLUS (SEPSIS)
1000.0000 mL | INTRAVENOUS | Status: AC
  Administered 2015-12-06 (×2): 1000 mL via INTRAVENOUS

## 2015-12-06 MED ORDER — IPRATROPIUM-ALBUTEROL 0.5-2.5 (3) MG/3ML IN SOLN
3.0000 mL | RESPIRATORY_TRACT | Status: DC | PRN
  Administered 2015-12-08: 3 mL via RESPIRATORY_TRACT
  Filled 2015-12-06: qty 3

## 2015-12-06 MED ORDER — PANTOPRAZOLE SODIUM 40 MG PO TBEC
40.0000 mg | DELAYED_RELEASE_TABLET | Freq: Two times a day (BID) | ORAL | Status: DC
  Administered 2015-12-06 – 2015-12-09 (×6): 40 mg via ORAL
  Filled 2015-12-06 (×9): qty 1

## 2015-12-06 MED ORDER — ASPIRIN EC 81 MG PO TBEC
81.0000 mg | DELAYED_RELEASE_TABLET | Freq: Every day | ORAL | Status: DC
  Administered 2015-12-07 – 2015-12-09 (×3): 81 mg via ORAL
  Filled 2015-12-06 (×3): qty 1

## 2015-12-06 MED ORDER — DEXTROSE 5 % IV SOLN
500.0000 mg | Freq: Once | INTRAVENOUS | Status: AC
  Administered 2015-12-06: 500 mg via INTRAVENOUS
  Filled 2015-12-06: qty 500

## 2015-12-06 MED ORDER — INSULIN ASPART 100 UNIT/ML ~~LOC~~ SOLN
0.0000 [IU] | Freq: Three times a day (TID) | SUBCUTANEOUS | Status: DC
  Administered 2015-12-06: 5 [IU] via SUBCUTANEOUS
  Administered 2015-12-07: 7 [IU] via SUBCUTANEOUS
  Administered 2015-12-07: 5 [IU] via SUBCUTANEOUS
  Administered 2015-12-07: 2 [IU] via SUBCUTANEOUS
  Administered 2015-12-08: 3 [IU] via SUBCUTANEOUS
  Administered 2015-12-08: 5 [IU] via SUBCUTANEOUS
  Administered 2015-12-08: 7 [IU] via SUBCUTANEOUS
  Administered 2015-12-09: 2 [IU] via SUBCUTANEOUS
  Administered 2015-12-09: 9 [IU] via SUBCUTANEOUS

## 2015-12-06 MED ORDER — PREGABALIN 75 MG PO CAPS
75.0000 mg | ORAL_CAPSULE | Freq: Two times a day (BID) | ORAL | Status: DC
  Administered 2015-12-06 – 2015-12-09 (×6): 75 mg via ORAL
  Filled 2015-12-06 (×6): qty 1

## 2015-12-06 NOTE — ED Notes (Signed)
Nurse is in room trying to start a IV

## 2015-12-06 NOTE — ED Notes (Signed)
unabla to collect labs at this time.

## 2015-12-06 NOTE — H&P (Addendum)
[facility] History and Physical  [name] EYE:[id number] DOB: 04-25-1915 DOA: 12/06/2015  Referring physician: ER physician: Dr. Daleen Bo PCP: Donnajean Lopes, MD  Chief Complaint: shortness of breath   HPI:  81 year old female with past medical history of hypertension, dyslipidemia, diabetes, hypothyroidism who presented to [facility] ED with reports of ongoing cough productive of yellow sputum for past 3 days prior to this admission associated with fevers, chills, weakness. Pt reports not eating as well because of her symptoms. She has not tried any medications over the counter. No repots of chest pain or shortness of breath. She has some abdominal discomfort but no nausea or vomiting. No blood in stool or urine. No diarrhea. No reports of falls or loss of consciousness.    In ED,  BP was 166/57, HR 104, T max 101.9 F, oxygen saturation 93% with [place] oxygen support. Blood work showed WBC count 19.3, platelets 146, Cr 1.44 (at baseline). CXR showed no acute findings other than COPD. She was started on empiric rocephin and azithro for possible community acquired pneumonia.   Assessment & Plan    Principal Problem:   Acute respiratory failure with hypoxia (HCC) / COPD exacerbation (HCC) - Hypoxia on admission (oxygen saturation 93% with [place] oxygen support) likely due to combination of COPD and pneumonia - CXR on admission showed COPD and no acute cardiopulmonary findings - Stable resp status - Started duoneb as needed every 4 hours for shortness of breath or wheezing  - Started empiric abx, azithromycin and rocephin - Added robitussin as needed for cough  Active Problems:   Sepsis due to pneumonia ([place]) / Leukocytosis - Sepsis criteria met on admission with fever, tachycardia, hypoxia, leukocytosis and source of infection presumed to be clinical pneumonia - CXR on admission did not show acute findings - Sepsis order set placed - Follow up procalcitonin level. Lactic acid is  WNL - Follow up blood culture results - Pneumonia order set placed as well - Follow up strep pneumonia and legionella results      Hypothyroidism - Resume synthroid    Diabetic neuropathy (HCC) - Resume lyrica    Chronic kidney disease, stage III (moderate) - Stable creatinine, in baseline range of 1.4    Cerebellar stroke, chronic  (HCC) - Stable    Controlled diabetes mellitus with diabetic neuropathy, with long-term current use of insulin (HCC) - Resume insulin - Resume glimepiride     Dyslipidemia associated with type 2 diabetes mellitus (HCC) - Continue Pravachol    Thrombocytopenia (HCC) - Mild, likely sepsis - Using SCD;s for DVT prophylaxis    DVT prophylaxis:  - SCD's bilaterally   Radiological Exams on Admission: Dg Chest Port 1 View 12/06/2015  COPD without acute cardiopulmonary abnormality. Electronically Signed   By: Franchot Gallo M.D.   On: 12/06/2015 12:13    Code Status: Full Family Communication: Plan of care discussed with the patient  Disposition Plan: Admit for further evaluation, telemetry   Leisa Lenz, MD  [facility] Pager [phone]  Time spent in minutes: 75 minutes  Review of Systems:  Constitutional: Negative for fever, chills and malaise/fatigue. Negative for diaphoresis.  HENT: Negative for hearing loss, ear pain, nosebleeds, congestion, sore throat, neck pain, tinnitus and ear discharge.   Eyes: Negative for blurred vision, double vision, photophobia, pain, discharge and redness.  Respiratory: per HPI Cardiovascular: Negative for chest pain, palpitations, orthopnea, claudication and leg swelling.  Gastrointestinal: Negative for nausea, vomiting and abdominal pain. Negative for heartburn, constipation,  blood in stool and melena.  Genitourinary: Negative for dysuria, urgency, frequency, hematuria and flank pain.  Musculoskeletal: Negative for myalgias, back pain, joint pain and falls.  Skin: Negative for itching and rash.   Neurological: Negative for dizziness and weakness. Negative for tingling, tremors, sensory change, speech change, focal weakness, loss of consciousness and headaches.  Endo/Heme/Allergies: Negative for environmental allergies and polydipsia. Does not bruise/bleed easily.  Psychiatric/Behavioral: Negative for suicidal ideas. The patient is not nervous/anxious.      History reviewed. No pertinent past medical history. Past Surgical History  Procedure Laterality Date  . Hip fracture surgery Bilateral   . Fracture surgery    . Tonsillectomy    . Cataract extraction w/ intraocular lens  implant, bilateral Bilateral   . Esophagogastroduodenoscopy N/A 03/03/2015    Procedure: ESOPHAGOGASTRODUODENOSCOPY (EGD);  Surgeon: Inda Castle, MD;  Location: [place];  Service: Endoscopy;  Laterality: N/A;  . Esophagogastroduodenoscopy N/A 03/07/2015    Procedure: ESOPHAGOGASTRODUODENOSCOPY (EGD);  Surgeon: Jerene Bears, MD;  Location: [facility] ENDOSCOPY;  Service: Endoscopy;  Laterality: N/A;   Social History:  reports that she has never smoked. She has never used smokeless tobacco. She reports that she drinks alcohol. She reports that she does not use illicit drugs.  Allergies  Allergen Reactions  . Other     Ground red pepper lotion to rub on her knee caused SOB    Family History:  Family History  Problem Relation Age of Onset  . Diabetes Father      Prior to Admission medications   Medication Sig Start Date End Date Taking? Authorizing Provider  ACCU-CHEK AVIVA PLUS test strip CHECK BLOOD SUGAR THREE TIMES DAILY 07/12/15  Yes Elayne Snare, MD  ACCU-CHEK FASTCLIX LANCETS [place]  11/23/14  Yes Historical Provider, MD  aspirin 81 MG tablet Take 81 mg by mouth daily.   Yes Historical Provider, MD  Calcium Carbonate-Vitamin D (CALTRATE 600+D) 600-400 MG-UNIT per tablet Take 1 tablet by mouth daily.   Yes Historical Provider, MD  ferrous sulfate 325 (65 FE) MG tablet Take 325 mg by mouth daily with  breakfast.   Yes Historical Provider, MD  furosemide (LASIX) 20 MG tablet TAKE 1 TABLET BY MOUTH AS NEEDED Patient taking differently: TAKE 1 TABLET BY MOUTH AS NEEDED FOR SWELLING/FLUID 08/07/15  Yes Elayne Snare, MD  glimepiride (AMARYL) 2 MG tablet TAKE 1 TABLET BY MOUTH EVERY MORNING AND 1/2 TABLET AT [facility] 07/17/15  Yes Elayne Snare, MD  insulin aspart (NOVOLOG FLEXPEN) 100 UNIT/ML FlexPen If blood sugar is over 200, take 5 units and if they go over 300, take 6 units at that meal. Patient taking differently: Inject 3-6 Units into the skin daily. 3 units before meals 02/16/15  Yes Elayne Snare, MD  Insulin Pen Needle (NOVOFINE) 32G X 6 MM MISC Use as directed 11/21/14  Yes Elayne Snare, MD  Lancets Misc. (ACCU-CHEK FASTCLIX LANCET) KIT USE AS DIRECTED FOR TESTING 11/23/14  Yes Elayne Snare, MD  LYRICA 75 MG capsule TAKE 1 CAPSULE TWICE DAILY 07/12/15  Yes Elayne Snare, MD  Multiple Vitamins-Minerals (CENTRUM SILVER PO) Take 1 tablet by mouth.   Yes Historical Provider, MD  pantoprazole (PROTONIX) 40 MG tablet TAKE 1 TABLET BY MOUTH TWICE DAILY 09/08/15  Yes Elayne Snare, MD  pravastatin (PRAVACHOL) 20 MG tablet Take 20 mg by mouth daily. 06/25/15  Yes Historical Provider, MD  SYNTHROID 88 MCG tablet TAKE 1 TABLET DAILY 08/18/15  Yes Elayne Snare, MD  traMADol (ULTRAM) 50 MG tablet  Take 1 tablet (50 mg total) by mouth 2 (two) times daily. 09/15/15  Yes Elayne Snare, MD  vitamin B-12 (CYANOCOBALAMIN) 1000 MCG tablet Take 1,000 mcg by mouth daily.   Yes Historical Provider, MD   Physical Exam: Filed Vitals:   12/06/15 1207 12/06/15 1230 12/06/15 1231 12/06/15 1300  BP:  183/57 183/57 167/63  Pulse:  73 74 77  Temp: 101.9 F (38.8 C)     TempSrc: Rectal     Resp:  '18 18 18  ' SpO2:  94% 93% 93%    Physical Exam  Constitutional: Appears well-developed and well-nourished. No distress.  HENT: Normocephalic. No tonsillar erythema or exudates Eyes: Conjunctivae are normal. No scleral icterus.  Neck: Normal ROM. Neck  supple. No JVD. No tracheal deviation. No thyromegaly.  CVS: RRR, S1/S2 appreciated  Pulmonary: diminished but no wheezing, no rhonchi Abdominal: Soft. BS +,  no distension, tenderness, rebound or guarding.  Musculoskeletal: Normal range of motion. No edema and no tenderness.  Lymphadenopathy: No lymphadenopathy noted, cervical, inguinal. Neuro: Alert. Normal reflexes, muscle tone coordination. No focal neurologic deficits. Skin: Skin is warm and dry. No rash noted.  No erythema. No pallor.  Psychiatric: Normal mood and affect. Behavior, judgment, thought content normal.   Labs on Admission:  Basic Metabolic Panel:  Recent Labs Lab 12/06/15 1218  NA 134*  K 4.9  CL 97*  CO2 25  GLUCOSE 329*  BUN 31*  CREATININE 1.44*  CALCIUM 9.1   Liver Function Tests:  Recent Labs Lab 12/06/15 1218  AST 27  ALT 16  ALKPHOS 71  BILITOT 1.1  PROT 6.7  ALBUMIN 3.6   No results for input(s): LIPASE, AMYLASE in the last 168 hours. No results for input(s): AMMONIA in the last 168 hours. CBC:  Recent Labs Lab 12/06/15 1218  WBC 19.3*  NEUTROABS 16.0*  HGB 13.2  HCT 39.6  MCV 96.8  PLT 146*   Cardiac Enzymes: No results for input(s): CKTOTAL, CKMB, CKMBINDEX, TROPONINI in the last 168 hours. BNP: Invalid input(s): POCBNP CBG: No results for input(s): GLUCAP in the last 168 hours.  If 7PM-7AM, please contact night-coverage [email or web address] Password [email or web address] 12/06/2015, 1:48 PM

## 2015-12-06 NOTE — ED Provider Notes (Signed)
CSN: [id number]     Arrival date & time 12/06/15  1324 History   First MD Initiated Contact with Patient 12/06/15 1148     Chief Complaint  Patient presents with  . Cough  . Constipation     (Consider location/radiation/quality/duration/timing/severity/associated sxs/prior Treatment) HPI  Kiyoko T Raso is a 81 y.o. female who presents for evaluation of cough for 3 days, low-grade fever, upper abdominal pain and decreased bowel movement for several days. She lives alone and has a caretaker that comes in every day. Her family members brought her here for evaluation. Patient denies vomiting, but has not eaten anything today. She was able to eat yesterday but feels like she had trouble with upper abdominal pain after she ate a hamburger. She denies dizziness or weakness. There are no other known modifying factors.    Past Medical History  Diagnosis Date  . Hypothyroidism   . Tuberculosis 1938    During the Great Depression  . Migraines     "a long long time ago"  . Arthritis     "a little; in my joints" (03/02/2015)  . Hiatal hernia   . [place] ulcer   . Diabetic neuropathy ([place])   . Chronic kidney disease     stage 3  . Acute blood loss anemia   . Acute ischemic stroke ([place])   . Cerebellar stroke ([place])   . Type II diabetes mellitus (HCC)     insulin dependent   Past Surgical History  Procedure Laterality Date  . Hip fracture surgery Bilateral   . Fracture surgery    . Tonsillectomy    . Cataract extraction w/ intraocular lens  implant, bilateral Bilateral   . Esophagogastroduodenoscopy N/A 03/03/2015    Procedure: ESOPHAGOGASTRODUODENOSCOPY (EGD);  Surgeon: Inda Castle, MD;  Location: [place];  Service: Endoscopy;  Laterality: N/A;  . Esophagogastroduodenoscopy N/A 03/07/2015    Procedure: ESOPHAGOGASTRODUODENOSCOPY (EGD);  Surgeon: Jerene Bears, MD;  Location[facility] END[facility]COPY;  Service: Endoscopy;  Laterality: N/A;   Family History  Problem Relation Age of Onset  .  Diabetes Father    Social History  Substance Use Topics  . Smoking status: Never Smoker   . Smokeless tobacco: Never Used  . Alcohol Use: Yes     Comment: "I have had some alcohol in my younger days; a long time ago; when I was young"   OB History    No data available     Review of Systems  All other systems reviewed and are negative.     Allergies  Other  Home Medications   Prior to Admission medications   Medication Sig Start Date End Date Taking? Authorizing Provider  ACCU-CHEK AVIVA PLUS test strip CHECK BLOOD SUGAR THREE TIMES DAILY 07/12/15  Yes Elayne Snare, MD  ACCU-CHEK FASTCLIX LANCETS [place]  11/23/14  Yes Historical Provider, MD  aspirin 81 MG tablet Take 81 mg by mouth daily.   Yes Historical Provider, MD  Calcium Carbonate-Vitamin D (CALTRATE 600+D) 600-400 MG-UNIT per tablet Take 1 tablet by mouth daily.   Yes Historical Provider, MD  ferrous sulfate 325 (65 FE) MG tablet Take 325 mg by mouth daily with breakfast.   Yes Historical Provider, MD  furosemide (LASIX) 20 MG tablet TAKE 1 TABLET BY MOUTH AS NEEDED Patient taking differently: TAKE 1 TABLET BY MOUTH AS NEEDED FOR SWELLING/FLUID 08/07/15  Yes Elayne Snare, MD  glimepiride (AMARYL) 2 MG tablet TAKE 1 TABLET BY MOUTH EVERY MORNING AND 1/2 TABLET AT [facility] 07/17/15  Yes Elayne Snare, MD  insulin aspart (NOVOLOG FLEXPEN) 100 UNIT/ML FlexPen If blood sugar is over 200, take 5 units and if they go over 300, take 6 units at that meal. Patient taking differently: Inject 3-6 Units into the skin daily. 3 units before meals 02/16/15  Yes Elayne Snare, MD  Insulin Pen Needle (NOVOFINE) 32G X 6 MM MISC Use as directed 11/21/14  Yes Elayne Snare, MD  Lancets Misc. (ACCU-CHEK FASTCLIX LANCET) KIT USE AS DIRECTED FOR TESTING 11/23/14  Yes Elayne Snare, MD  LYRICA 75 MG capsule TAKE 1 CAPSULE TWICE DAILY 07/12/15  Yes Elayne Snare, MD  Multiple Vitamins-Minerals (CENTRUM SILVER PO) Take 1 tablet by mouth.   Yes Historical Provider, MD   pantoprazole (PROTONIX) 40 MG tablet TAKE 1 TABLET BY MOUTH TWICE DAILY 09/08/15  Yes Elayne Snare, MD  pravastatin (PRAVACHOL) 20 MG tablet Take 20 mg by mouth daily. 06/25/15  Yes Historical Provider, MD  SYNTHROID 88 MCG tablet TAKE 1 TABLET DAILY 08/18/15  Yes Elayne Snare, MD  traMADol (ULTRAM) 50 MG tablet Take 1 tablet (50 mg total) by mouth 2 (two) times daily. 09/15/15  Yes Elayne Snare, MD  vitamin B-12 (CYANOCOBALAMIN) 1000 MCG tablet Take 1,000 mcg by mouth daily.   Yes Historical Provider, MD   BP 167/63 mmHg  Pulse 77  Temp(Src) 101.9 F (38.8 C) (Rectal)  Resp 18  SpO2 93% Physical Exam  Constitutional: She is oriented to person, place, and time. She appears well-developed.  Elderly, frail  HENT:  Head: Normocephalic and atraumatic.  Right Ear: External ear normal.  Left Ear: External ear normal.  Eyes: Conjunctivae and EOM are normal. Pupils are equal, round, and reactive to light.  Neck: Normal range of motion and phonation normal. Neck supple.  Cardiovascular: Normal rate, regular rhythm and normal heart sounds.   Pulmonary/Chest: Effort normal. She exhibits no bony tenderness.  Rales right base. Fair air movement bilaterally. No wheezes or Rales.  Abdominal: Soft. There is no tenderness.  Musculoskeletal: Normal range of motion.  Neurological: She is alert and oriented to person, place, and time. No cranial nerve deficit or sensory deficit. She exhibits normal muscle tone. Coordination normal.  Skin: Skin is warm, dry and intact.  Psychiatric: She has a normal mood and affect. Her behavior is normal. Judgment and thought content normal.  Nursing note and vitals reviewed.   ED Course  Procedures (including critical care time)  Medications  sodium chloride 0.9 % bolus 1,000 mL (1,000 mLs Intravenous New Bag/Given 12/06/15 1304)  azithromycin (ZITHROMAX) 500 mg in dextrose 5 % 250 mL IVPB (500 mg Intravenous New Bag/Given 12/06/15 1226)  cefTRIAXone (ROCEPHIN) 1 g in  dextrose 5 % 50 mL IVPB (0 g Intravenous Stopped 12/06/15 1254)  acetaminophen (TYLENOL) tablet 650 mg (650 mg Oral Given 12/06/15 1235)    Patient Vitals for the past 24 hrs:  BP Temp Temp src Pulse Resp SpO2  12/06/15 1300 167/63 mmHg - - 77 18 93 %  12/06/15 1231 183/57 mmHg - - 74 18 93 %  12/06/15 1230 183/57 mmHg - - 73 18 94 %  12/06/15 1207 - 101.9 F (38.8 C) Rectal - - -  12/06/15 1005 179/84 mmHg 100.4 F (38 C) Oral 104 20 96 %  12/06/15 1002 - - - - - 95 %    1:11 PM Reevaluation with update and discussion. After initial assessment and treatment, an updated evaluation reveals no change in clinical status. Meeya Goldin L   1:15 PM-Consult  complete with Hospitalist. Patient case explained and discussed. She agrees to admit patient for further evaluation and treatment. Call ended at [place] - Abnormal; Notable for the following:    Sodium 134 (*)    Chloride 97 (*)    Glucose, Bld 329 (*)    BUN 31 (*)    Creatinine, Ser 1.44 (*)    GFR calc non Af Amer 29 (*)    GFR calc Af Amer 34 (*)    All other components within normal limits  CBC WITH DIFFERENTIAL/PLATELET - Abnormal; Notable for the following:    WBC 19.3 (*)    Platelets 146 (*)    Neutro Abs 16.0 (*)    Monocytes Absolute 1.6 (*)    All other components within normal limits  CULTURE, BLOOD (ROUTINE X 2)  CULTURE, BLOOD (ROUTINE X 2)  URINE CULTURE  URINALYSIS, ROUTINE W REFLEX MICROSCOPIC (NOT AT [facility])  I-STAT CG4 LACTIC ACID, ED    Imaging Review Dg Chest Port 1 View  12/06/2015  CLINICAL DATA:  Code sepsis.  Cough. EXAM: PORTABLE CHEST 1 VIEW COMPARISON:  05/25/2015 FINDINGS: Heart size and vascularity normal. COPD with hyperinflation and apical scarring. Calcified granulomata in the right lung base unchanged. Right hemidiaphragm is chronically elevated. Negative for pneumonia. Negative for heart failure. Negative for mass lesion. IMPRESSION:  COPD without acute cardiopulmonary abnormality. Electronically Signed   By: Franchot Gallo M.D.   On: 12/06/2015 12:13   I have personally reviewed and evaluated these images and lab results as part of my medical decision-making.   EKG Interpretation None      MDM   Final diagnoses:  Community acquired pneumonia  Hyperglycemia    Clinical pneumonia, with nondiagnostic chest x-ray. Lactate less than 2. Elevated white blood cell count, and marked hyperglycemia. Normal anion gap. Doubt severe sepsis, metabolic instability or impending vascular collapse.  Will arrange for admission, for monitoring based on potential for decompensation.  Nursing Notes Reviewed/ Care Coordinated Applicable Imaging Reviewed Interpretation of Laboratory Data incorporated into ED treatment  Plan: Admit  Daleen Bo, MD 12/06/15 [id number]

## 2015-12-06 NOTE — ED Notes (Signed)
Bed: [id number] Expected date:  Expected time:  Means of arrival:  Comments: EMS- 89y F, constipation/URI

## 2015-12-06 NOTE — ED Notes (Signed)
Per EMS, patient from home. Patient c/o no BM in 3-5 days. Patient also has productive cough x3 days with yellow-brown mucous.

## 2015-12-06 NOTE — Progress Notes (Signed)
Received report from ED RN, Pt arrived unit accompanied by her family. Pt is alert and oriented, able to communicate needs, MD notified of Pt's location, will continue with current plan of care.

## 2015-12-06 NOTE — Progress Notes (Signed)
Utilization Review completed.  Efrem Pitstick RN CM  

## 2015-12-06 NOTE — ED Notes (Signed)
Family at bedside. 

## 2015-12-07 LAB — CBC
HEMATOCRIT: 34 % — AB (ref 36.0–46.0)
HEMOGLOBIN: 11.2 g/dL — AB (ref 12.0–15.0)
MCH: 31.9 pg (ref 26.0–34.0)
MCHC: 32.9 g/dL (ref 30.0–36.0)
MCV: 96.9 fL (ref 78.0–100.0)
Platelets: 124 10*3/uL — ABNORMAL LOW (ref 150–400)
RBC: 3.51 MIL/uL — AB (ref 3.87–5.11)
RDW: 12.8 % (ref 11.5–15.5)
WBC: 18.4 10*3/uL — AB (ref 4.0–10.5)

## 2015-12-07 LAB — GLUCOSE, CAPILLARY
GLUCOSE-CAPILLARY: 350 mg/dL — AB (ref 65–99)
GLUCOSE-CAPILLARY: 277 mg/dL — AB (ref 65–99)
Glucose-Capillary: 166 mg/dL — ABNORMAL HIGH (ref 65–99)
Glucose-Capillary: 284 mg/dL — ABNORMAL HIGH (ref 65–99)
Glucose-Capillary: 286 mg/dL — ABNORMAL HIGH (ref 65–99)

## 2015-12-07 LAB — BASIC METABOLIC PANEL
ANION GAP: 5 (ref 5–15)
BUN: 24 mg/dL — ABNORMAL HIGH (ref 6–20)
CHLORIDE: 105 mmol/L (ref 101–111)
CO2: 25 mmol/L (ref 22–32)
Calcium: 8 mg/dL — ABNORMAL LOW (ref 8.9–10.3)
Creatinine, Ser: 1.35 mg/dL — ABNORMAL HIGH (ref 0.44–1.00)
GFR calc non Af Amer: 32 mL/min — ABNORMAL LOW (ref >60)
GFR, EST AFRICAN AMERICAN: 37 mL/min — AB (ref >60)
Glucose, Bld: 202 mg/dL — ABNORMAL HIGH (ref 65–99)
POTASSIUM: 4.2 mmol/L (ref 3.5–5.1)
SODIUM: 135 mmol/L (ref 135–145)

## 2015-12-07 LAB — STREP PNEUMONIAE URINARY ANTIGEN: Strep Pneumo Urinary Antigen: NEGATIVE

## 2015-12-07 LAB — LEGIONELLA ANTIGEN, URINE

## 2015-12-07 NOTE — Progress Notes (Addendum)
Patient ID: [name], female   DOB: [birth year], 81 y.o.   MRN: [id number] [facility] PROGRESS NOTE  [name] [id number] DOB: 1915-05-15 DOA: 12/29/15 PCP: Donnajean Lopes, MD  Brief narrative:    81 year old female with past medical history of hypertension, dyslipidemia, diabetes, hypothyroidism who presented to [facility] ED with reports of ongoing cough productive of yellow sputum for past 3 days prior to this admission associated with fevers, chills, weakness, poor po intake. On admission, she was febrile with tachycardia, hypoxia. Her WBC count was 19.3, platelets 146, Cr 1.44 (at baseline). CXR showed no acute findings other than COPD. She was started on empiric rocephin and azithro for possible community acquired pneumonia.   Anticipated discharge: 12/09/2015.  Assessment/Plan:    Principal Problem:  Acute respiratory failure with hypoxia (HCC) / COPD exacerbation (HCC) - Hypoxia likely due to combination of COPD and pneumonia - CXR on admission showed COPD without acute cardiopulmonary findings - Continue as needed neb treatments - Continue azithromycin and rocephin  Active Problems:  Sepsis due to pneumonia ([place]) / Leukocytosis - Sepsis criteria met on admission with fever, tachycardia, hypoxia, leukocytosis. Procalcitonin level was mildly elevated at 0.91. Lactic acid was WNL - Strep pneumonia is negative and legionella is pending - Blood cultures are pending     Hypothyroidism - Continue synthroid   Diabetic neuropathy (HCC) - Continue lyrica   Chronic kidney disease, stage III (moderate) - Stable creatinine, in baseline range of 1.4 and improving with hydration    Cerebellar stroke (HCC) - Stable   Controlled diabetes mellitus with diabetic neuropathy, with long-term current use of insulin (HCC) - Continue insulin - Continue glimepiride    Dyslipidemia associated with type 2 diabetes mellitus (HCC) - Continue Pravachol    Thrombocytopenia (HCC) - Mild, likely secondary to sepsis   DVT prophylaxis:  - SCD's bilaterally in hospital    Code Status: Full.  Family Communication:  plan of care discussed with the patient and her grandson at the bedside  Disposition Plan: Home likely by 12/09/2015.  IV access:  Peripheral IV  Procedures and diagnostic studies:    Dg Chest Port 1 View Dec 29, 2015 COPD without acute cardiopulmonary abnormality. Electronically Signed   By: Franchot Gallo M.D.   On: Dec 29, 2015 12:13   Medical Consultants:  None   Other Consultants:  PT Nutrition  IAnti-Infectives:   Azithromycin and rocephin 12-29-2015 -->    Leisa Lenz, MD  [facility] Pager [phone]  Time spent in minutes: 25 minutes  If 7PM-7AM, please contact night-coverage [email or web address] Password [email or web address] 12/07/2015, 9:03 AM   LOS: 1 day    HPI/Subjective: No acute overnight events. Patient reports feeling better.  Objective: Filed Vitals:   2015-12-29 1330 29-Dec-2015 1423 12-29-15 2150 12/07/15 0620  BP: 166/57 143/78 157/47 142/55  Pulse: 81 94 74 78  Temp:  98.4 F (36.9 C) 98.3 F (36.8 C) 98.9 F (37.2 C)  TempSrc:  Oral Oral Oral  Resp: '13 16 20 20  ' SpO2: 96% 94% 97% 94%    Intake/Output Summary (Last 24 hours) at 12/07/15 9449 Last data filed at 12/07/15 6759  Gross per 24 hour  Intake 723.33 ml  Output   1050 ml  Net -326.67 ml    Exam:   General:  Pt is alert, follows commands appropriately, not in acute distress  Cardiovascular: Regular rate and rhythm, S1/S2 appreciated   Respiratory: Clear to auscultation bilaterally, no wheezing, no crackles, no rhonchi  Abdomen: Soft, non tender,  non distended, bowel sounds present  Extremities: No edema, pulses DP and PT palpable bilaterally  Neuro: Grossly nonfocal  Data Reviewed: Basic Metabolic Panel:  Recent Labs Lab 12/06/15 1218 12/07/15 0507  NA 134* 135  K 4.9 4.2  CL 97* 105  CO2 25 25  GLUCOSE 329* 202*  BUN  31* 24*  CREATININE 1.44* 1.35*  CALCIUM 9.1 8.0*  MG 2.0  --   PHOS 2.7  --    Liver Function Tests:  Recent Labs Lab 12/06/15 1218  AST 27  ALT 16  ALKPHOS 71  BILITOT 1.1  PROT 6.7  ALBUMIN 3.6   No results for input(s): LIPASE, AMYLASE in the last 168 hours. No results for input(s): AMMONIA in the last 168 hours. CBC:  Recent Labs Lab 12/06/15 1218 12/07/15 0507  WBC 19.3* 18.4*  NEUTROABS 16.0*  --   HGB 13.2 11.2*  HCT 39.6 34.0*  MCV 96.8 96.9  PLT 146* 124*   Cardiac Enzymes: No results for input(s): CKTOTAL, CKMB, CKMBINDEX, TROPONINI in the last 168 hours. BNP: Invalid input(s): POCBNP CBG:  Recent Labs Lab 12/06/15 1636 12/06/15 2148 12/07/15 0750  GLUCAP 290* 230* 166*    No results found for this or any previous visit (from the past 240 hour(s)).   Scheduled Meds: . aspirin EC  81 mg Oral Daily  . azithromycin  500 mg Intravenous Q24H  . calcium-vitamin D  1 tablet Oral Daily  . cefTRIAXone   1 g Intravenous Q24H  . ferrous sulfate  325 mg Oral Q breakfast  . glimepiride  1 mg Oral Q breakfast  . insulin aspart  0-9 Units Subcutaneous TID WC  . levothyroxine  88 mcg Oral QAC breakfast  . pantoprazole  40 mg Oral BID  . pravastatin  20 mg Oral Daily  . pregabalin  75 mg Oral BID  . traMADol  50 mg Oral BID  . vitamin B-12  1,000 mcg Oral Daily   Continuous Infusions: . sodium chloride 50 mL/hr at 12/06/15 1532

## 2015-12-07 NOTE — Progress Notes (Signed)
Pt c/o dizziness.  VSS. No c/o pain. CBG checked and corrected w/ SSI as per order.  MD notified. Will continue to monitor.
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
## 2015-12-07 NOTE — Progress Notes (Signed)
Nutrition Brief Note  Patient seen for consult.  Wt Readings from Last 15 Encounters:  08/21/15 138 lb 4 oz (62.71 kg)  06/30/15 136 lb 12.8 oz (62.052 kg)  06/08/15 137 lb (62.143 kg)  05/25/15 130 lb 15.3 oz (59.4 kg)  05/04/15 134 lb 6.4 oz (60.963 kg)  04/25/15 139 lb (63.05 kg)  04/06/15 143 lb 3.2 oz (64.955 kg)  03/23/15 138 lb 3.2 oz (62.687 kg)  03/10/15 133 lb 12.8 oz (60.691 kg)  03/09/15 133 lb 12.8 oz (60.691 kg)  03/07/15 137 lb 8 oz (62.37 kg)  02/20/15 141 lb (63.957 kg)  12/15/14 140 lb 3.2 oz (63.594 kg)  11/21/14 136 lb 6.4 oz (61.871 kg)  08/19/14 143 lb 6.4 oz (65.046 kg)   Current diet order is Heart Healthy, patient is consuming approximately 75% of meals at this time. Labs and medications reviewed.   Pt states that for breakfast this AM she had [place] toast, a muffin, and a fruit cup and was able to eat most, but not all, of this meal. She states that the day PTA she was unable to eat due to acute symptoms. Prior to onset and hospitalization, pt had a very good appetite. She states that she has a home health aid who prepares meals for her and that she often eats more than she cares to eat at each sitting. She states that she does not want to gain weight as she does not want to have to buy new clothes.   Per chart review, pt's weight has had slight fluctuations (130-143 lbs since 08/19/14). Most current recorded weight is from 08/21/15. Weight is stable overall. No muscle or fat wasting noted during physical assessment.   Pt denies chewing or swallowing difficulties with any foods or liquids.  No nutrition interventions warranted at this time. If nutrition issues arise, please consult RD.      Jarome Matin, RD, LDN Inpatient Clinical Dietitian Pager # [phone] After hours/weekend pager # [phone]

## 2015-12-07 NOTE — Evaluation (Signed)
Occupational Therapy Evaluation Patient Details Name: [name] MRN: [id number] DOB: 1915/11/18 Today's Date: 12/07/2015    History of Present Illness 81 year old female with past medical history of hypertension, dyslipidemia, diabetes, hypothyroidism who presented to [facility] ED with reports of ongoing cough productive of yellow sputum for past 3 days prior to this admission associated with fevers, chills, weakness, poor po intake   Clinical Impression   Pt admitted with chills and weakness. Pt currently with functional limitations due to the deficits listed below (see OT Problem List). Pt will benefit from skilled OT to increase their safety and independence with ADL and functional mobility for ADL to facilitate discharge to venue listed below.      Follow Up Recommendations  Home health OT;Supervision/Assistance - 24 hour          Precautions / Restrictions Precautions Precautions: Fall      Mobility Bed Mobility Overal bed mobility: Needs Assistance Bed Mobility: Sit to Supine       Sit to supine: Supervision      Transfers Overall transfer level: Needs assistance Equipment used: 1 person hand held assist Transfers: Sit to/from Stand;Stand Pivot Transfers Sit to Stand: Min guard Stand pivot transfers: Min guard            Balance                                            ADL Overall ADL's : Needs assistance/impaired                         Toilet Transfer: Min guard;Ambulation;Comfort height toilet   Toileting- Clothing Manipulation and Hygiene: Min guard;Sit to/from stand;Cueing for safety       Functional mobility during ADLs: Cueing for safety;Cueing for sequencing;Min guard                 Pertinent Vitals/Pain Pain Assessment: No/denies pain     Hand Dominance Right   Extremity/Trunk Assessment Upper Extremity Assessment Upper Extremity Assessment: Generalized weakness           Communication  Communication Communication: HOH   Cognition Arousal/Alertness: Awake/alert Behavior During Therapy: WFL for tasks assessed/performed Overall Cognitive Status: Within Functional Limits for tasks assessed                                Home Living Family/patient expects to be discharged to:: Private residence Living Arrangements: Alone Available Help at Discharge: Personal care attendant;Family;Available PRN/intermittently Type of Home: House Home Access: Stairs to enter Entrance Stairs-Number of Steps: 1 small step   Home Layout: One level               Home Equipment: Walker - 2 wheels   Additional Comments: [employer] aide comes in 2 hours in the morning and 2 hours in the evening.       Prior Functioning/Environment Level of Independence: Needs assistance  Gait / Transfers Assistance Needed: Uses the RW all the time for assist ADL's / Homemaking Assistance Needed: [employer] aide assists with dressing but pt states she can bathe independently. Aide also assists with taking care of the home, and provides meals.    Comments: Pt is active and goes out with friends often. Pt states she had a full schedule for  the holiday weekend with friends prior to being admitted.     OT Diagnosis: Generalized weakness   OT Problem List: Decreased strength;Decreased activity tolerance   OT Treatment/Interventions: Self-care/ADL training;DME and/or AE instruction;Patient/family education    OT Goals(Current goals can be found in the care plan section) Acute Rehab OT Goals Patient Stated Goal: get stronger  OT Goal Formulation: With patient Time For Goal Achievement: 12/21/15 Potential to Achieve Goals: Good  OT Frequency: Min 2X/week   Barriers to D/C:               End of Session Nurse Communication: Mobility status  Activity Tolerance: Patient limited by fatigue Patient left: in bed;with call bell/phone within reach   Time: 1205-1216 OT Time  Calculation (min): 11 min Charges:  OT General Charges $OT Visit: 1 Procedure OT Evaluation $Initial OT Evaluation Tier I: 1 Procedure G-Codes:    Betsy Pries 2015-12-28, 12:45 PM

## 2015-12-08 LAB — GLUCOSE, CAPILLARY
GLUCOSE-CAPILLARY: 213 mg/dL — AB (ref 65–99)
GLUCOSE-CAPILLARY: 344 mg/dL — AB (ref 65–99)
GLUCOSE-CAPILLARY: 285 mg/dL — AB (ref 65–99)
GLUCOSE-CAPILLARY: 245 mg/dL — AB (ref 65–99)

## 2015-12-08 NOTE — Progress Notes (Signed)
Patient ID: [name], female   DOB: 04-07-1915, 81 y.o.   MRN: [id number] [facility] PROGRESS NOTE  [name] [id number]:[id number] DOB: May 20, 1915 DOA: 01/02/2016 PCP: Donnajean Lopes, MD  Brief narrative:    81 year old female with past medical history of hypertension, dyslipidemia, diabetes, hypothyroidism who presented to [facility] ED with reports of ongoing cough productive of yellow sputum for past 3 days prior to this admission associated with fevers, chills, weakness, poor po intake. On admission, she was febrile with tachycardia, hypoxia. Her WBC count was 19.3, platelets 146, Cr 1.44 (at baseline). CXR showed no acute findings other than COPD. She was started on empiric rocephin and azithro for possible community acquired pneumonia.   Anticipated discharge: 12/09/2015.  Assessment/Plan:    Principal Problem:  Acute respiratory failure with hypoxia (HCC) / COPD exacerbation (HCC) - Hypoxia likely due to combination of COPD and pneumonia - CXR on admission showed COPD without acute cardiopulmonary findings - Continue azithromycin and rocephin for now - Stable resp status   Active Problems:  Sepsis due to pneumonia ([place]) / Leukocytosis - Sepsis criteria met on admission with fever, tachycardia, hypoxia, leukocytosis. Procalcitonin level was mildly elevated at 0.91. Lactic acid was WNL - Strep pneumonia is negative and legionella is negative  - Blood cultures so far are negative     Hypothyroidism - Continue synthroid   Diabetic neuropathy (HCC) - Continue lyrica   Chronic kidney disease, stage III (moderate) - Cr within baseline range    Cerebellar stroke (HCC) - Stable   Controlled diabetes mellitus with diabetic neuropathy, with long-term current use of insulin (HCC) - Continue current insulin regimen  - Continue glimepiride    Dyslipidemia associated with type 2 diabetes mellitus (HCC) - Continue Pravachol   Thrombocytopenia (HCC) - Mild,  likely secondary to sepsis - CBC in am  DVT prophylaxis:  - SCD's bilaterally    Code Status: Full.  Family Communication:  plan of care discussed with the patient and her grandson at the bedside  Disposition Plan: Home likely by 12/09/2015.  IV access:  Peripheral IV  Procedures and diagnostic studies:    Dg Chest Port 1 View 01/02/2016 COPD without acute cardiopulmonary abnormality. Electronically Signed   By: Franchot Gallo M.D.   On: 01/02/16 12:13   Medical Consultants:  None   Other Consultants:  PT Nutrition  IAnti-Infectives:   Azithromycin and rocephin 01/02/2016 -->    Leisa Lenz, MD  [facility] Pager [phone]  Time spent in minutes: 15 minutes  If 7PM-7AM, please contact night-coverage [email or web address] Password [email or web address] 12/08/2015, 12:08 PM   LOS: 2 days    HPI/Subjective: No acute overnight events. Patient reports no respiratory distress.   Objective: Filed Vitals:   12/07/15 1427 12/07/15 1813 12/07/15 2053 12/08/15 0544  BP: 129/55 180/60 153/42 139/67  Pulse: 89 74 80 70  Temp: 98.8 F (37.1 C) 98.6 F (37 C) 99.4 F (37.4 C) 98.5 F (36.9 C)  TempSrc: Oral Oral Oral Oral  Resp: _0 Height:      Weight:      SpO2: 96% 99% 96% 97%    Intake/Output Summary (Last 24 hours) at 12/08/15 1208 Last data filed at 12/08/15 0819  Gross per 24 hour  Intake   1680 ml  Output    800 ml  Net    880 ml    Exam:   General:  Pt is not in acute distress  Cardiovascular: RRR, S1/S2 (+)  Respiratory: No wheezing, no crackles, no rho  Extremities: No leg swelling, palpable puslesnchi  Abdomen: (+) BS, non tender   Extremities: No leg swelling, palpable pulses  Neuro: Nonfocal  Data Reviewed: Basic Metabolic Panel:  Recent Labs Lab 12/06/15 1218 12/07/15 0507  NA 134* 135  K 4.9 4.2  CL 97* 105  CO2 25 25  GLUCOSE 329* 202*  BUN 31* 24*  CREATININE 1.44* 1.35*  CALCIUM 9.1 8.0*  MG 2.0  --   PHOS 2.7  --     Liver Function Tests:  Recent Labs Lab 12/06/15 1218  AST 27  ALT 16  ALKPHOS 71  BILITOT 1.1  PROT 6.7  ALBUMIN 3.6   No results for input(s): LIPASE, AMYLASE in the last 168 hours. No results for input(s): AMMONIA in the last 168 hours. CBC:  Recent Labs Lab 12/06/15 1218 12/07/15 0507  WBC 19.3* 18.4*  NEUTROABS 16.0*  --   HGB 13.2 11.2*  HCT 39.6 34.0*  MCV 96.8 96.9  PLT 146* 124*   Cardiac Enzymes: No results for input(s): CKTOTAL, CKMB, CKMBINDEX, TROPONINI in the last 168 hours. BNP: Invalid input(s): POCBNP CBG:  Recent Labs Lab 12/07/15 1651 12/07/15 1816 12/07/15 2050 12/08/15 0724 12/08/15 1141  GLUCAP 284* 286* 277* 213* 344*    Recent Results (from the past 240 hour(s))  Blood Culture (routine x 2)     Status: None (Preliminary result)   Collection Time: 12/06/15 12:16 PM  Result Value Ref Range Status   Specimen Description BLOOD RIGHT FOREARM  Final   Special Requests BOTTLES DRAWN AEROBIC ONLY 8CC  Final   Culture   Final    NO GROWTH < 24 HOURS Performed at [facility]    Report Status PENDING  Incomplete  Blood Culture (routine x 2)     Status: None (Preliminary result)   Collection Time: 12/06/15 12:18 PM  Result Value Ref Range Status   Specimen Description BLOOD RIGHT ANTECUBITAL  Final   Special Requests BOTTLES DRAWN AEROBIC AND ANAEROBIC 5CC  Final   Culture   Final    NO GROWTH < 24 HOURS Performed at [facility]    Report Status PENDING  Incomplete     Scheduled Meds: . aspirin EC  81 mg Oral Daily  . azithromycin  500 mg Intravenous Q24H  . calcium-vitamin D  1 tablet Oral Daily  . cefTRIAXone   1 g Intravenous Q24H  . ferrous sulfate  325 mg Oral Q breakfast  . glimepiride  1 mg Oral Q breakfast  . insulin aspart  0-9 Units Subcutaneous TID WC  . levothyroxine  88 mcg Oral QAC breakfast  . pantoprazole  40 mg Oral BID  . pravastatin  20 mg Oral Daily  . pregabalin  75 mg Oral BID  .  traMADol  50 mg Oral BID  . vitamin B-12  1,000 mcg Oral Daily   Continuous Infusions: . sodium chloride 50 mL/hr at 12/08/15 0736
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
## 2015-12-08 NOTE — Progress Notes (Signed)
Inpatient Diabetes Program Recommendations  AACE/ADA: New Consensus Statement on Inpatient Glycemic Control (2015)  Target Ranges:  Prepandial:   less than 140 mg/dL      Peak postprandial:   less than 180 mg/dL (1-2 hours)      Critically ill patients:  140 - 180 mg/dL   Results for [name], [name] (MRN [id number]) as of 12/08/2015 09:04  Ref. Range 12/07/2015 07:50 12/07/2015 12:30 12/07/2015 16:51 12/07/2015 18:16 12/07/2015 20:50  Glucose-Capillary Latest Ref Range: 65-99 mg/dL 166 (H) 350 (H) 284 (H) 286 (H) 277 (H)    Admit COPD/ PNA/ Sepsis.   History: DM, HTN, CKD  Home DM Meds: Amaryl 2 mg AM/ 1 mg PM        Novolog 3-6 units tidwc  Current Insulin Orders: Novolog Sensitive SSI (0-9 units) TID AC      Amaryl 1 mg daily    MD- Please consider starting Novolog Meal Coverage for this patient since she is having severely elevated postprandial glucose levels-  Novolog 4 units tid with meals     --Will follow patient during hospitalization--  Wyn Quaker RN, MSN, CDE Diabetes Coordinator Inpatient Glycemic Control Team Team Pager: [phone] (8a-5p)

## 2015-12-09 LAB — BASIC METABOLIC PANEL
Anion gap: 6 (ref 5–15)
BUN: 18 mg/dL (ref 6–20)
CALCIUM: 7.8 mg/dL — AB (ref 8.9–10.3)
CO2: 23 mmol/L (ref 22–32)
CREATININE: 1.37 mg/dL — AB (ref 0.44–1.00)
Chloride: 108 mmol/L (ref 101–111)
GFR calc non Af Amer: 31 mL/min — ABNORMAL LOW (ref >60)
GFR, EST AFRICAN AMERICAN: 36 mL/min — AB (ref >60)
GLUCOSE: 177 mg/dL — AB (ref 65–99)
Potassium: 4.1 mmol/L (ref 3.5–5.1)
Sodium: 137 mmol/L (ref 135–145)

## 2015-12-09 LAB — CBC
HEMATOCRIT: 32.5 % — AB (ref 36.0–46.0)
Hemoglobin: 10.6 g/dL — ABNORMAL LOW (ref 12.0–15.0)
MCH: 31.5 pg (ref 26.0–34.0)
MCHC: 32.6 g/dL (ref 30.0–36.0)
MCV: 96.7 fL (ref 78.0–100.0)
Platelets: 142 10*3/uL — ABNORMAL LOW (ref 150–400)
RBC: 3.36 MIL/uL — ABNORMAL LOW (ref 3.87–5.11)
RDW: 12.9 % (ref 11.5–15.5)
WBC: 10.4 10*3/uL (ref 4.0–10.5)

## 2015-12-09 LAB — GLUCOSE, CAPILLARY
Glucose-Capillary: 156 mg/dL — ABNORMAL HIGH (ref 65–99)
Glucose-Capillary: 389 mg/dL — ABNORMAL HIGH (ref 65–99)

## 2015-12-09 MED ORDER — LEVOFLOXACIN 500 MG PO TABS: 500.0000 mg | ORAL_TABLET | Freq: Every day | ORAL | Status: DC

## 2015-12-09 NOTE — Discharge Summary (Signed)
Physician Discharge Summary  [name] OIN:[id number] DOB: [birth year] DOA: 12/06/2015  PCP: Donnajean Lopes, MD  Admit date: 12/06/2015 Discharge date: 12/09/2015  Recommendations for Outpatient Follow-up:  1. Continue Levaquin for 3 days on discharge.   Discharge Diagnoses:  Principal Problem:   Acute respiratory failure with hypoxia (HCC) Active Problems:   Sepsis due to pneumonia (HCC)   COPD exacerbation (HCC)   Hypothyroidism   Diabetic neuropathy (HCC)   Chronic kidney disease, stage III (moderate)   Cerebellar stroke (HCC)   Controlled diabetes mellitus with diabetic neuropathy, with long-term current use of insulin (HCC)   Dyslipidemia associated with type 2 diabetes mellitus (HCC)   Leukocytosis   Thrombocytopenia ([place])    Discharge Condition: stable   Diet recommendation: as tolerated   History of present illness:  81 year old female with past medical history of hypertension, dyslipidemia, diabetes, hypothyroidism who presented to [facility] ED with reports of ongoing cough productive of yellow sputum for past 3 days prior to this admission associated with fevers, chills, weakness, poor po intake. On admission, she was febrile with tachycardia, hypoxia. Her WBC count was 19.3, platelets 146, Cr 1.44 (at baseline). CXR showed no acute findings other than COPD. She was started on empiric rocephin and azithro for possible community acquired pneumonia.   Hospital Course:   Assessment/Plan:    Principal Problem:  Acute respiratory failure with hypoxia (HCC) / COPD exacerbation (HCC) - Hypoxia likely due to combination of COPD and pneumonia - CXR on admission showed COPD without acute cardiopulmonary findings - She was on azithromycin and rocephin but will continue Levaquin for 3 days on discharge   Active Problems:  Sepsis due to pneumonia ([place]) / Leukocytosis - Sepsis criteria met on admission with fever, tachycardia, hypoxia, leukocytosis. Procalcitonin level  was mildly elevated at 0.91. Lactic acid was WNL - Strep pneumonia and legionella all negative  - Blood cultures so far are negative     Hypothyroidism - Continue synthroid per home dose    Diabetic neuropathy (HCC) - Continue lyrica on discharge    Chronic kidney disease, stage III (moderate) - Cr within baseline range    Cerebellar stroke (HCC) - Stable   Controlled diabetes mellitus with diabetic neuropathy, with long-term current use of insulin (HCC)  - Continue glimepiride and insulin per home regimen    Dyslipidemia associated with type 2 diabetes mellitus (HCC) - Continue Pravachol on discharge per home dose    Thrombocytopenia (HCC) - Mild, likely secondary to sepsis - Improved since admission   DVT prophylaxis:  - SCD's bilaterally in hospital    Code Status: Full.  Family Communication: plan of care discussed with the patient and her grandson at the bedside   IV access:  Peripheral IV  Procedures and diagnostic studies:   Dg Chest Port 1 View 12/06/2015 COPD without acute cardiopulmonary abnormality. Electronically Signed By: Franchot Gallo M.D. On: 12/06/2015 12:13   Medical Consultants:  None   Other Consultants:  PT Nutrition  IAnti-Infectives:   Azithromycin and rocephin 12/06/2015 --> 12/08/2015     Signed:  Leisa Lenz, MD  [facility] 12/09/2015, 10:43 AM  Pager #: [phone]  Time spent in minutes: more than 30 minutes   Discharge Exam: Filed Vitals:   12/08/15 1403 12/09/15 0506  BP: 139/58 128/64  Pulse: 79 76  Temp: 98.3 F (36.8 C) 98.4 F (36.9 C)  Resp:  18   Filed Vitals:   12/08/15 0544 12/08/15 1332 12/08/15 1403 12/09/15 0506  BP:  139/67  139/58 128/64  Pulse: 70  79 76  Temp: 98.5 F (36.9 C)  98.3 F (36.8 C) 98.4 F (36.9 C)  TempSrc: Oral  Oral Oral  Resp: 18   18  Height:      Weight:      SpO2: 97% 97% 97% 96%    General: Pt is alert, follows commands  appropriately, not in acute distress Cardiovascular: Regular rate and rhythm, S1/S2 + Respiratory: Clear to auscultation bilaterally, no wheezing, no crackles, no rhonchi Abdominal: Soft, non tender, non distended, bowel sounds +, no guarding Extremities: no edema, no cyanosis, pulses palpable bilaterally DP and PT Neuro: Grossly nonfocal  Discharge Instructions  Discharge Instructions    Call MD for:  difficulty breathing, headache or visual disturbances    Complete by:  As directed      Call MD for:  persistant dizziness or light-headedness    Complete by:  As directed      Call MD for:  persistant nausea and vomiting    Complete by:  As directed      Call MD for:  severe uncontrolled pain    Complete by:  As directed      Diet - low sodium heart healthy    Complete by:  As directed      Discharge instructions    Complete by:  As directed   Continue Levaquin for 3 days on discharge.     Increase activity slowly    Complete by:  As directed             Medication List    STOP taking these medications        furosemide 20 MG tablet  Commonly known as:  LASIX      TAKE these medications        ACCU-CHEK AVIVA PLUS test strip  Generic drug:  glucose blood  CHECK BLOOD SUGAR THREE TIMES DAILY     ACCU-CHEK FASTCLIX LANCET Kit  USE AS DIRECTED FOR TESTING     ACCU-CHEK FASTCLIX LANCETS Misc     aspirin 81 MG tablet  Take 81 mg by mouth daily.     CALTRATE 600+D 600-400 MG-UNIT tablet  Generic drug:  Calcium Carbonate-Vitamin D  Take 1 tablet by mouth daily.     CENTRUM SILVER PO  Take 1 tablet by mouth.     ferrous sulfate 325 (65 FE) MG tablet  Take 325 mg by mouth daily with breakfast.     glimepiride 2 MG tablet  Commonly known as:  AMARYL  TAKE 1 TABLET BY MOUTH EVERY MORNING AND 1/2 TABLET AT DINNER     insulin aspart 100 UNIT/ML FlexPen  Commonly known as:  NOVOLOG FLEXPEN  If blood sugar is over 200, take 5 units and if they go over 300, take 6  units at that meal.     Insulin Pen Needle 32G X 6 MM Misc  Commonly known as:  NOVOFINE  Use as directed     levofloxacin 500 MG tablet  Commonly known as:  LEVAQUIN  Take 1 tablet (500 mg total) by mouth daily.     LYRICA 75 MG capsule  Generic drug:  pregabalin  TAKE 1 CAPSULE TWICE DAILY     pantoprazole 40 MG tablet  Commonly known as:  PROTONIX  TAKE 1 TABLET BY MOUTH TWICE DAILY     pravastatin 20 MG tablet  Commonly known as:  PRAVACHOL  Take 20 mg by mouth daily.  SYNTHROID 88 MCG tablet  Generic drug:  levothyroxine  TAKE 1 TABLET DAILY     traMADol 50 MG tablet  Commonly known as:  ULTRAM  Take 1 tablet (50 mg total) by mouth 2 (two) times daily.     vitamin B-12 1000 MCG tablet  Commonly known as:  CYANOCOBALAMIN  Take 1,000 mcg by mouth daily.           Follow-up Information    Follow up with Donnajean Lopes, MD. Schedule an appointment as soon as possible for a visit in 1 week.   Specialty:  Internal Medicine   Why:  Follow up appt after recent hospitalization   Contact information:   [address] [place] [id number] [phone]        The results of significant diagnostics from this hospitalization (including imaging, microbiology, ancillary and laboratory) are listed below for reference.    Significant Diagnostic Studies: Dg Chest Port 1 View  12/06/2015  CLINICAL DATA:  Code sepsis.  Cough. EXAM: PORTABLE CHEST 1 VIEW COMPARISON:  05/25/2015 FINDINGS: Heart size and vascularity normal. COPD with hyperinflation and apical scarring. Calcified granulomata in the right lung base unchanged. Right hemidiaphragm is chronically elevated. Negative for pneumonia. Negative for heart failure. Negative for mass lesion. IMPRESSION: COPD without acute cardiopulmonary abnormality. Electronically Signed   By: Franchot Gallo M.D.   On: 12/06/2015 12:13    Microbiology: Recent Results (from the past 240 hour(s))  Blood Culture (routine x 2)      Status: None (Preliminary result)   Collection Time: 12/06/15 12:16 PM  Result Value Ref Range Status   Specimen Description BLOOD RIGHT FOREARM  Final   Special Requests BOTTLES DRAWN AEROBIC ONLY 8CC  Final   Culture   Final    NO GROWTH 2 DAYS Performed at [facility]    Report Status PENDING  Incomplete  Blood Culture (routine x 2)     Status: None (Preliminary result)   Collection Time: 12/06/15 12:18 PM  Result Value Ref Range Status   Specimen Description BLOOD RIGHT ANTECUBITAL  Final   Special Requests BOTTLES DRAWN AEROBIC AND ANAEROBIC 5CC  Final   Culture   Final    NO GROWTH 2 DAYS Performed at [facility]    Report Status PENDING  Incomplete     Labs: Basic Metabolic Panel:  Recent Labs Lab 12/06/15 1218 12/07/15 0507 12/09/15 0508  NA 134* 135 137  K 4.9 4.2 4.1  CL 97* 105 108  CO2 _0 GLUCOSE 329* 202* 177*  BUN 31* 24* 18  CREATININE 1.44* 1.35* 1.37*  CALCIUM 9.1 8.0* 7.8*  MG 2.0  --   --   PHOS 2.7  --   --    Liver Function Tests:  Recent Labs Lab 12/06/15 1218  AST 27  ALT 16  ALKPHOS 71  BILITOT 1.1  PROT 6.7  ALBUMIN 3.6   No results for input(s): LIPASE, AMYLASE in the last 168 hours. No results for input(s): AMMONIA in the last 168 hours. CBC:  Recent Labs Lab 12/06/15 1218 12/07/15 0507 12/09/15 0508  WBC 19.3* 18.4* 10.4  NEUTROABS 16.0*  --   --   HGB 13.2 11.2* 10.6*  HCT 39.6 34.0* 32.5*  MCV 96.8 96.9 96.7  PLT 146* 124* 142*   Cardiac Enzymes: No results for input(s): CKTOTAL, CKMB, CKMBINDEX, TROPONINI in the last 168 hours. BNP: BNP (last 3 results) No results for input(s): BNP in the last 8760  hours.  ProBNP (last 3 results) No results for input(s): PROBNP in the last 8760 hours.  CBG:  Recent Labs Lab 12/08/15 0724 12/08/15 1141 12/08/15 1643 12/08/15 2218 12/09/15 0734  GLUCAP 213* 344* 285* 245* 156*

## 2015-12-09 NOTE — Progress Notes (Signed)
Pt leaving at this time with her son. Alert, oriented, and without c/o. Discharge instructions/prescription given/explained with pt and her son verbalizing understanding.  Followup appointments noted.

## 2015-12-09 NOTE — Care Management Important Message (Signed)
Important Message  Patient Details  Name: [name] MRN: [id number] Date of Birth: [birth year]   Medicare Important Message Given:  Yes    Erenest Rasher, RN 12/09/2015, 1:31 PM

## 2015-12-09 NOTE — Discharge Instructions (Addendum)
Community-Acquired Pneumonia, Adult °Pneumonia is an infection of the lungs. There are different types of pneumonia. One type can develop while a person is in a hospital. A different type, called community-acquired pneumonia, develops in people who are not, or have not recently been, in the hospital or other health care facility.  °CAUSES °Pneumonia may be caused by bacteria, viruses, or funguses. Community-acquired pneumonia is often caused by Streptococcus pneumonia bacteria. These bacteria are often passed from one person to another by breathing in droplets from the cough or sneeze of an infected person. °RISK FACTORS °The condition is more likely to develop in: °· People who have chronic diseases, such as chronic obstructive pulmonary disease (COPD), asthma, congestive heart failure, cystic fibrosis, diabetes, or kidney disease. °· People who have early-stage or late-stage HIV. °· People who have sickle cell disease. °· People who have had their spleen removed (splenectomy). °· People who have poor dental hygiene. °· People who have medical conditions that increase the risk of breathing in (aspirating) secretions their own mouth and nose.   °· People who have a weakened immune system (immunocompromised). °· People who smoke. °· People who travel to areas where pneumonia-causing germs commonly exist. °· People who are around animal habitats or animals that have pneumonia-causing germs, including birds, bats, rabbits, cats, and farm animals. °SYMPTOMS °Symptoms of this condition include: °· A dry cough. °· A wet (productive) cough. °· Fever. °· Sweating. °· Chest pain, especially when breathing deeply or coughing. °· Rapid breathing or difficulty breathing. °· Shortness of breath. °· Shaking chills. °· Fatigue. °· Muscle aches. °DIAGNOSIS °Your health care provider will take a medical history and perform a physical exam. You may also have other tests, including: °· Imaging studies of your chest, including  X-rays. °· Tests to check your blood oxygen level and other blood gases. °· Other tests on blood, mucus (sputum), fluid around your lungs (pleural fluid), and urine. °If your pneumonia is severe, other tests may be done to identify the specific cause of your illness. °TREATMENT °The type of treatment that you receive depends on many factors, such as the cause of your pneumonia, the medicines you take, and other medical conditions that you have. For most adults, treatment and recovery from pneumonia may occur at home. In some cases, treatment must happen in a hospital. Treatment may include: °· Antibiotic medicines, if the pneumonia was caused by bacteria. °· Antiviral medicines, if the pneumonia was caused by a virus. °· Medicines that are given by mouth or through an IV tube. °· Oxygen. °· Respiratory therapy. °Although rare, treating severe pneumonia may include: °· Mechanical ventilation. This is done if you are not breathing well on your own and you cannot maintain a safe blood oxygen level. °· Thoracentesis. This procedure removes fluid around one lung or both lungs to help you breathe better. °HOME CARE INSTRUCTIONS °· Take over-the-counter and prescription medicines only as told by your health care provider. °¨ Only take cough medicine if you are losing sleep. Understand that cough medicine can prevent your body's natural ability to remove mucus from your lungs. °¨ If you were prescribed an antibiotic medicine, take it as told by your health care provider. Do not stop taking the antibiotic even if you start to feel better. °· Sleep in a semi-upright position at night. Try sleeping in a reclining chair, or place a few pillows under your head. °· Do not use tobacco products, including cigarettes, chewing tobacco, and e-cigarettes. If you need help quitting, ask your health care provider. °· Drink enough water to keep your urine   clear or pale yellow. This will help to thin out mucus secretions in your  lungs. °PREVENTION °There are ways that you can decrease your risk of developing community-acquired pneumonia. Consider getting a pneumococcal vaccine if: °· You are older than 79 years of age. °· You are older than 79 years of age and are undergoing cancer treatment, have chronic lung disease, or have other medical conditions that affect your immune system. Ask your health care provider if this applies to you. °There are different types and schedules of pneumococcal vaccines. Ask your health care provider which vaccination option is best for you. °You may also prevent community-acquired pneumonia if you take these actions: °· Get an influenza vaccine every year. Ask your health care provider which type of influenza vaccine is best for you. °· Go to the dentist on a regular basis. °· Wash your hands often. Use hand sanitizer if soap and water are not available. °SEEK MEDICAL CARE IF: °· You have a fever. °· You are losing sleep because you cannot control your cough with cough medicine. °SEEK IMMEDIATE MEDICAL CARE IF: °· You have worsening shortness of breath. °· You have increased chest pain. °· Your sickness becomes worse, especially if you are an older adult or have a weakened immune system. °· You cough up blood. °  °This information is not intended to replace advice given to you by your health care provider. Make sure you discuss any questions you have with your health care provider. °  °Document Released: 12/16/2005 Document Revised: 09/06/2015 Document Reviewed: 04/12/2015 °Elsevier Interactive Patient Education ©2016 Else[employer] °Levofloxacin tablets °What is this medicine? °LEVOFLOXACIN (lee voe FLOX a sin) is a quinolone antibiotic. It is used to treat certain kinds of bacterial infections. It will not work for colds, flu, or other viral infections. °This medicine may be used for other purposes; ask your health care provider or pharmacist if you have questions. °What should I tell my health care provider  before I take this medicine? °They need to know if you have any of these conditions: °-bone problems °-cerebral disease °-history of low levels of potassium in the blood °-irregular heartbeat °-joint problems °-kidney disease °-myasthenia gravis °-seizures °-tendon problems °-tingling of the fingers or toes, or other nerve disorder °-an unusual or allergic reaction to levofloxacin, other quinolone antibiotics, foods, dyes, or preservatives °-pregnant or trying to get pregnant °-breast-feeding °How should I use this medicine? °Take this medicine by mouth with a full glass of water. Follow the directions on the prescription label. This medicine can be taken with or without food. Take your medicine at regular intervals. Do not take your medicine more often than directed. Do not skip doses or stop your medicine early even if you feel better. Do not stop taking except on your doctor's advice. °A special MedGuide will be given to you by the pharmacist with each prescription and refill. Be sure to read this information carefully each time. °Talk to your pediatrician regarding the use of this medicine in children. While this drug may be prescribed for children as young as 6 months for selected conditions, precautions do apply. °Overdosage: If you think you have taken too much of this medicine contact a poison control center or emergency room at once. °NOTE: This medicine is only for you. Do not share this medicine with others. °What if I miss a dose? °If you miss a dose, take it as soon as you remember. If it is almost time for your next dose, take   only that dose. Do not take double or extra doses. °What may interact with this medicine? °Do not take this medicine with any of the following medications: °-arsenic trioxide °-chloroquine °-droperidol °-medicines for irregular heart rhythm like amiodarone, disopyramide, dofetilide, flecainide, quinidine, procainamide, sotalol °-some medicines for depression or mental problems  like phenothiazines, pimozide, and ziprasidone °This medicine may also interact with the following medications: °-amoxapine °-antacids °-birth control pills °-cisapride °-dairy products °-didanosine (ddI) buffered tablets or powder °-haloperidol °-multivitamins °-NSAIDS, medicines for pain and inflammation, like ibuprofen or naproxen °-retinoid products like tretinoin or isotretinoin °-risperidone °-some other antibiotics like clarithromycin or erythromycin °-sucralfate °-theophylline °-warfarin °This list may not describe all possible interactions. Give your health care provider a list of all the medicines, herbs, non-prescription drugs, or dietary supplements you use. Also tell them if you smoke, drink alcohol, or use illegal drugs. Some items may interact with your medicine. °What should I watch for while using this medicine? °Tell your doctor or health care professional if your symptoms do not improve or if they get worse. Drink several glasses of water a day and cut down on drinks that contain caffeine. You must not get dehydrated while taking this medicine. °You may get drowsy or dizzy. Do not drive, use machinery, or do anything that needs mental alertness until you know how this medicine affects you. Do not sit or stand up quickly, especially if you are an older patient. This reduces the risk of dizzy or fainting spells. °This medicine can make you more sensitive to the sun. Keep out of the sun. If you cannot avoid being in the sun, wear protective clothing and use a sunscreen. Do not use sun lamps or tanning beds/booths. Contact your doctor if you get a sunburn. °If you are a diabetic monitor your blood glucose carefully. If you get an unusual reading stop taking this medicine and call your doctor right away. °Do not treat diarrhea with over-the-counter products. Contact your doctor if you have diarrhea that lasts more than 2 days or if the diarrhea is severe and watery. °Avoid antacids, calcium, iron, and  zinc products for 2 hours before and 2 hours after taking a dose of this medicine. °What side effects may I notice from receiving this medicine? °Side effects that you should report to your doctor or health care professional as soon as possible: °-allergic reactions like skin rash or hives, swelling of the face, lips, or tongue °-anxious °-confusion °-depressed mood °-diarrhea °-fast, irregular heartbeat °-hallucination, loss of contact with reality °-joint, muscle, or tendon pain or swelling °-pain, tingling, numbness in the hands or feet °-suicidal thoughts or other mood changes °-sunburn °-unusually weak or tired °Side effects that usually do not require medical attention (report to your doctor or health care professional if they continue or are bothersome): °-dry mouth °-headache °-nausea °-trouble sleeping °This list may not describe all possible side effects. Call your doctor for medical advice about side effects. You may report side effects to FDA at 1-800-FDA-1088. °Where should I keep my medicine? °Keep out of the reach of children. °Store at room temperature between 15 and 30 degrees C (59 and 86 degrees F). Keep in a tightly closed container. Throw away any unused medicine after the expiration date. °NOTE: This sheet is a summary. It may not cover all possible information. If you have questions about this medicine, talk to your doctor, pharmacist, or health care provider. °  °© 2016, Elsevier/[employer]. (2015-07-27 12:40:18) ° °

## 2015-12-09 NOTE — Care Management Note (Addendum)
Case Management Note  Patient Details  Name: [name] MRN: [id number] Date of Birth: 17-Dec-1915  Subjective/Objective:      Sepsis due to pneumonia               Action/Plan: NCM spoke to pt and states she lives independently in her own home. She has private duty aide that comes daily a couple of hours per day. Offered choice for [facility]. Pt requested [facility] for HH. States they had [facility] in the past.   Expected Discharge Date:  12/09/2015               Expected Discharge Plan:  [place]  In-House Referral:  NA  Discharge planning Services  CM Consult  Post Acute Care Choice:  Home Health Choice offered to:  Patient    HH Arranged:  RN, PT, Nurse's Aide [place] Agency:  [place]  Status of Service:  Completed, signed off  Medicare Important Message Given:  Yes Date Medicare IM Given:    Medicare IM give by:    Date Additional Medicare IM Given:    Additional Medicare Important Message give by:     If discussed at [place] of Stay Meetings, dates discussed:    Additional Comments:  Erenest Rasher, RN 12/09/2015, 1:36 PM

## 2015-12-10 DIAGNOSIS — E785 Hyperlipidemia, unspecified: Secondary | ICD-10-CM | POA: Diagnosis not present

## 2015-12-10 DIAGNOSIS — E1122 Type 2 diabetes mellitus with diabetic chronic kidney disease: Secondary | ICD-10-CM | POA: Diagnosis not present

## 2015-12-10 DIAGNOSIS — I129 Hypertensive chronic kidney disease with stage 1 through stage 4 chronic kidney disease, or unspecified chronic kidney disease: Secondary | ICD-10-CM | POA: Diagnosis not present

## 2015-12-10 DIAGNOSIS — E1140 Type 2 diabetes mellitus with diabetic neuropathy, unspecified: Secondary | ICD-10-CM | POA: Diagnosis not present

## 2015-12-10 DIAGNOSIS — J441 Chronic obstructive pulmonary disease with (acute) exacerbation: Secondary | ICD-10-CM | POA: Diagnosis not present

## 2015-12-10 DIAGNOSIS — N183 Chronic kidney disease, stage 3 (moderate): Secondary | ICD-10-CM | POA: Diagnosis not present

## 2015-12-10 DIAGNOSIS — J189 Pneumonia, unspecified organism: Secondary | ICD-10-CM | POA: Diagnosis not present

## 2015-12-10 DIAGNOSIS — E1169 Type 2 diabetes mellitus with other specified complication: Secondary | ICD-10-CM | POA: Diagnosis not present

## 2015-12-10 DIAGNOSIS — J440 Chronic obstructive pulmonary disease with acute lower respiratory infection: Secondary | ICD-10-CM | POA: Diagnosis not present

## 2015-12-11 ENCOUNTER — Emergency Department (HOSPITAL_COMMUNITY)
Admission: EM | Admit: 2015-12-11 | Discharge: 2015-12-11 | Disposition: A | Discharge status: Home / Self Care | Payer: Commercial Managed Care - HMO | Attending: Emergency Medicine | Admitting: Emergency Medicine

## 2015-12-11 ENCOUNTER — Ambulatory Visit: Payer: Commercial Managed Care - HMO | Admitting: Podiatry

## 2015-12-11 ENCOUNTER — Encounter (HOSPITAL_COMMUNITY): Payer: Self-pay | Admitting: *Deleted

## 2015-12-11 ENCOUNTER — Encounter: Payer: Self-pay | Admitting: Podiatry

## 2015-12-11 ENCOUNTER — Ambulatory Visit (INDEPENDENT_AMBULATORY_CARE_PROVIDER_SITE_OTHER): Payer: Commercial Managed Care - HMO | Admitting: Podiatry

## 2015-12-11 DIAGNOSIS — L84 Corns and callosities: Secondary | ICD-10-CM

## 2015-12-11 DIAGNOSIS — B351 Tinea unguium: Secondary | ICD-10-CM | POA: Diagnosis not present

## 2015-12-11 DIAGNOSIS — Z79899 Other long term (current) drug therapy: Secondary | ICD-10-CM | POA: Insufficient documentation

## 2015-12-11 DIAGNOSIS — Z794 Long term (current) use of insulin: Secondary | ICD-10-CM | POA: Insufficient documentation

## 2015-12-11 DIAGNOSIS — E0849 Diabetes mellitus due to underlying condition with other diabetic neurological complication: Secondary | ICD-10-CM | POA: Diagnosis not present

## 2015-12-11 DIAGNOSIS — R069 Unspecified abnormalities of breathing: Secondary | ICD-10-CM | POA: Diagnosis not present

## 2015-12-11 DIAGNOSIS — M79676 Pain in unspecified toe(s): Secondary | ICD-10-CM | POA: Diagnosis not present

## 2015-12-11 DIAGNOSIS — R05 Cough: Secondary | ICD-10-CM | POA: Diagnosis present

## 2015-12-11 DIAGNOSIS — M2010 Hallux valgus (acquired), unspecified foot: Secondary | ICD-10-CM

## 2015-12-11 DIAGNOSIS — I10 Essential (primary) hypertension: Secondary | ICD-10-CM | POA: Diagnosis not present

## 2015-12-11 DIAGNOSIS — Z792 Long term (current) use of antibiotics: Secondary | ICD-10-CM | POA: Insufficient documentation

## 2015-12-11 DIAGNOSIS — E1140 Type 2 diabetes mellitus with diabetic neuropathy, unspecified: Secondary | ICD-10-CM | POA: Diagnosis not present

## 2015-12-11 DIAGNOSIS — Z7982 Long term (current) use of aspirin: Secondary | ICD-10-CM | POA: Insufficient documentation

## 2015-12-11 DIAGNOSIS — E1149 Type 2 diabetes mellitus with other diabetic neurological complication: Secondary | ICD-10-CM

## 2015-12-11 LAB — CULTURE, BLOOD (ROUTINE X 2)
CULTURE: NO GROWTH
CULTURE: NO GROWTH

## 2015-12-11 MED ORDER — HYDROCHLOROTHIAZIDE 25 MG PO TABS: 12.5000 mg | ORAL_TABLET | Freq: Every day | ORAL | Status: DC

## 2015-12-11 NOTE — Progress Notes (Signed)
Patient ID: [name], female   DOB: 1915/09/17, 81 y.o.   MRN: [id number]  Subjective: 81 y.o. returns the office today for painful, elongated, thickened toenails which she is unable to trim herself.  Denies any redness or drainage around the nails.  She was just discharged from the hospital for pneumonia. Denies any acute changes since last appointment and no new complaints today. Denies any systemic complaints such as fevers, chills, nausea, vomiting.   Objective: AAO 3, NAD DP/PT pulses palpable, CRT less than 3 seconds Protective sensation decreased with Simms Weinstein monofilament Nails hypertrophic, dystrophic, elongated, brittle, discolored 10. There is ingrowing on both the medial and lateral aspects of bilateral hallux toenail as well as second digit. There is tenderness overlying the nails 1-5 bilaterally. There is no surrounding erythema or drainage along the nail sites. Significant left HAV deformity present with overlying hyperkeratotic lesion off the medial aspect of the first metatarsal head on the left. Upon debridement there was no underlying ulceration, drainage or other signs of infection. There is slight erythema due to irritation in shoe gear, no increase  In warmth or ascending saline as. No open lesions or  otherpre-ulcerative lesions are identified. No other areas of tenderness bilateral lower extremities. No overlying edema, erythema, increased warmth. No pain with calf compression, swelling, warmth, erythema.  Assessment: Patient presents with symptomatic onychomycosis/ingrown toenail; hyperkerotic lesion   Plan: -Treatment options including alternatives, risks, complications were discussed -Nails sharply debrided 10 without complication/bleeding. - hyperkeratotic lesion sharply debrided 1 without complication/clean. Dispensed offloading pad. -Discussed daily foot inspection. If there are any changes, to call the office immediately.  -Follow-up in 3 months  or sooner if any problems are to arise. In the meantime, encouraged to call the office with any questions, concerns, changes symptoms.  Celesta Gentile, DPM

## 2015-12-11 NOTE — ED Notes (Signed)
Pt arrives from home via [facility]. PT was admitted at [facility] 3 days ago for PNA/Sepsis. Pt was d/c yesterday at 1500. Pt had a "coughing spell" this morning per family and they want the pt to have a f/u on her pna. Pt states she feels much better and had no complaints at this time.

## 2015-12-11 NOTE — Discharge Instructions (Signed)
Hypertension Take hydrochlorothiazide as prescribed this week. See your family doctor in the next 3-4 days for ongoing blood pressure monitoring and recheck of lab work. Do not take any additional Lasix while taking this medication. Hypertension, commonly called high blood pressure, is when the force of blood pumping through your arteries is too strong. Your arteries are the blood vessels that carry blood from your heart throughout your body. A blood pressure reading consists of a higher number over a lower number, such as 110/72. The higher number (systolic) is the pressure inside your arteries when your heart pumps. The lower number (diastolic) is the pressure inside your arteries when your heart relaxes. Ideally you want your blood pressure below 120/80. Hypertension forces your heart to work harder to pump blood. Your arteries may become narrow or stiff. Having untreated or uncontrolled hypertension can cause heart attack, stroke, kidney disease, and other problems. RISK FACTORS Some risk factors for high blood pressure are controllable. Others are not.  Risk factors you cannot control include:   Race. You may be at higher risk if you are African American.  Age. Risk increases with age.  Gender. Men are at higher risk than women before age 73 years. After age 56, women are at higher risk than men. Risk factors you can control include:  Not getting enough exercise or physical activity.  Being overweight.  Getting too much fat, sugar, calories, or salt in your diet.  Drinking too much alcohol. SIGNS AND SYMPTOMS Hypertension does not usually cause signs or symptoms. Extremely high blood pressure (hypertensive crisis) may cause headache, anxiety, shortness of breath, and nosebleed. DIAGNOSIS To check if you have hypertension, your health care provider will measure your blood pressure while you are seated, with your arm held at the level of your heart. It should be measured at least twice  using the same arm. Certain conditions can cause a difference in blood pressure between your right and left arms. A blood pressure reading that is higher than normal on one occasion does not mean that you need treatment. If it is not clear whether you have high blood pressure, you may be asked to return on a different day to have your blood pressure checked again. Or, you may be asked to monitor your blood pressure at home for 1 or more weeks. TREATMENT Treating high blood pressure includes making lifestyle changes and possibly taking medicine. Living a healthy lifestyle can help lower high blood pressure. You may need to change some of your habits. Lifestyle changes may include:  Following the DASH diet. This diet is high in fruits, vegetables, and whole grains. It is low in salt, red meat, and added sugars.  Keep your sodium intake below 2,300 mg per day.  Getting at least 30-45 minutes of aerobic exercise at least 4 times per week.  Losing weight if necessary.  Not smoking.  Limiting alcoholic beverages.  Learning ways to reduce stress. Your health care provider may prescribe medicine if lifestyle changes are not enough to get your blood pressure under control, and if one of the following is true:  You are 71-30 years of age and your systolic blood pressure is above 140.  You are 23 years of age or older, and your systolic blood pressure is above 150.  Your diastolic blood pressure is above 90.  You have diabetes, and your systolic blood pressure is over [id number] or your diastolic blood pressure is over 90.  You have kidney disease and your blood pressure  is above 140/90.  You have heart disease and your blood pressure is above 140/90. Your personal target blood pressure may vary depending on your medical conditions, your age, and other factors. HOME CARE INSTRUCTIONS  Have your blood pressure rechecked as directed by your health care provider.   Take medicines only as directed by  your health care provider. Follow the directions carefully. Blood pressure medicines must be taken as prescribed. The medicine does not work as well when you skip doses. Skipping doses also puts you at risk for problems.  Do not smoke.   Monitor your blood pressure at home as directed by your health care provider. SEEK MEDICAL CARE IF:   You think you are having a reaction to medicines taken.  You have recurrent headaches or feel dizzy.  You have swelling in your ankles.  You have trouble with your vision. SEEK IMMEDIATE MEDICAL CARE IF:  You develop a severe headache or confusion.  You have unusual weakness, numbness, or feel faint.  You have severe chest or abdominal pain.  You vomit repeatedly.  You have trouble breathing. MAKE SURE YOU:   Understand these instructions.  Will watch your condition.  Will get help right away if you are not doing well or get worse.   This information is not intended to replace advice given to you by your health care provider. Make sure you discuss any questions you have with your health care provider.   Document Released: 12/16/2005 Document Revised: 05/02/2015 Document Reviewed: 10/08/2013 Elsevier Interactive Patient Education [employer].

## 2015-12-11 NOTE — ED Notes (Signed)
Pt is in stable condition upon d/c and is escorted from ED via wheelchair. 

## 2015-12-11 NOTE — ED Provider Notes (Signed)
CSN: [id number]     Arrival date & time 12/11/15  1106 History   First MD Initiated Contact with Patient 12/11/15 1110     Chief Complaint  Patient presents with  . Cough     (Consider location/radiation/quality/duration/timing/severity/associated sxs/prior Treatment) HPI The patient had scheduled follow-up with her primary care provider today. She reports she also had podiatric schedule followed. She reports that this morning her care provider had a conversation with her family doctor's office in the determined that she should be seen in the emergency department instead of the office. She reports she's not sure why they felt that she should be seen in the emergency department. She states that she was hospitalized for pneumonia and at that time had coughing and shortness of breath with wheeze. She reports that has improved significantly. She reports now she has almost no coughing and can do her normal activities without dyspnea. She states this morning she ate a good breakfast and felt well. History reviewed. No pertinent past medical history. Past Surgical History  Procedure Laterality Date  . Hip fracture surgery Bilateral   . Fracture surgery    . Tonsillectomy    . Cataract extraction w/ intraocular lens  implant, bilateral Bilateral   . Esophagogastroduodenoscopy N/A 03/03/2015    Procedure: ESOPHAGOGASTRODUODENOSCOPY (EGD);  Surgeon: Inda Castle, MD;  Location: [place];  Service: Endoscopy;  Laterality: N/A;  . Esophagogastroduodenoscopy N/A 03/07/2015    Procedure: ESOPHAGOGASTRODUODENOSCOPY (EGD);  Surgeon: Jerene Bears, MD;  Location[facility] ENDOSCOPY;  Service: Endoscopy;  Laterality: N/A;   Family History  Problem Relation Age of Onset  . Diabetes Father    Social History  Substance Use Topics  . Smoking status: Never Smoker   . Smokeless tobacco: Never Used  . Alcohol Use: Yes     Comment: "I have had some alcohol in my younger days; a long time ago; when I was young"    OB History    No data available     Review of Systems 10 Systems reviewed and are negative for acute change except as noted in the HPI.    Allergies  Other  Home Medications   Prior to Admission medications   Medication Sig Start Date End Date Taking? Authorizing Provider  aspirin 81 MG tablet Take 81 mg by mouth daily.   Yes Historical Provider, MD  Calcium Carbonate-Vitamin D (CALTRATE 600+D) 600-400 MG-UNIT per tablet Take 1 tablet by mouth daily.   Yes Historical Provider, MD  ferrous sulfate 325 (65 FE) MG tablet Take 325 mg by mouth daily with breakfast.   Yes Historical Provider, MD  glimepiride (AMARYL) 2 MG tablet TAKE 1 TABLET BY MOUTH EVERY MORNING AND 1/2 TABLET AT [facility] 07/17/15  Yes Elayne Snare, MD  insulin aspart (NOVOLOG FLEXPEN) 100 UNIT/ML FlexPen If blood sugar is over 200, take 5 units and if they go over 300, take 6 units at that meal. Patient taking differently: Inject 3-6 Units into the skin daily. 3 units before meals 02/16/15  Yes Elayne Snare, MD  levofloxacin (LEVAQUIN) 500 MG tablet Take 1 tablet (500 mg total) by mouth daily. 12/09/15  Yes Robbie Lis, MD  LYRICA 75 MG capsule TAKE 1 CAPSULE TWICE DAILY 07/12/15  Yes Elayne Snare, MD  Multiple Vitamins-Minerals (CENTRUM SILVER PO) Take 1 tablet by mouth.   Yes Historical Provider, MD  pantoprazole (PROTONIX) 40 MG tablet TAKE 1 TABLET BY MOUTH TWICE DAILY 09/08/15  Yes Elayne Snare, MD  pravastatin (PRAVACHOL) 20  MG tablet Take 20 mg by mouth daily. 06/25/15  Yes Historical Provider, MD  SYNTHROID 88 MCG tablet TAKE 1 TABLET DAILY 08/18/15  Yes Elayne Snare, MD  traMADol (ULTRAM) 50 MG tablet Take 1 tablet (50 mg total) by mouth 2 (two) times daily. Patient taking differently: Take 50 mg by mouth every 12 (twelve) hours as needed for moderate pain.  09/15/15  Yes Elayne Snare, MD  vitamin B-12 (CYANOCOBALAMIN) 1000 MCG tablet Take 1,000 mcg by mouth daily.   Yes Historical Provider, MD  ACCU-CHEK AVIVA PLUS test strip  [place] Patient not taking: Reported on 12/11/2015 07/12/15   Elayne Snare, MD  ACCU-CHEK FASTCLIX LANCETS [place]  11/23/14   Historical Provider, MD  hydrochlorothiazide (HYDRODIURIL) 25 MG tablet Take 0.5 tablets (12.5 mg total) by mouth daily. 12/11/15   Charlesetta Shanks, MD  Insulin Pen Needle (NOVOFINE) 32G X 6 MM MISC Use as directed Patient not taking: Reported on 12/11/2015 11/21/14   Elayne Snare, MD  Lancets Misc. (ACCU-CHEK FASTCLIX LANCET) KIT USE AS DIRECTED FOR TESTING Patient not taking: Reported on 12/11/2015 11/23/14   Elayne Snare, MD   BP 181/65 mmHg  Pulse 65  Temp(Src) 98.3 F (36.8 C) (Oral)  SpO2 99% Physical Exam  Constitutional: She is oriented to person, place, and time. She appears well-developed and well-nourished.  HENT:  Head: Normocephalic and atraumatic.  Eyes: EOM are normal. Pupils are equal, round, and reactive to light.  Neck: Neck supple.  Cardiovascular: Normal rate, regular rhythm, normal heart sounds and intact distal pulses.   Pulmonary/Chest: Effort normal and breath sounds normal.  Abdominal: Soft. Bowel sounds are normal. She exhibits no distension. There is no tenderness.  Musculoskeletal: Normal range of motion. She exhibits edema.  Patient has trace pitting edema bilateral lower extremities. No calf tenderness.  Neurological: She is alert and oriented to person, place, and time. She has normal strength. Coordination normal. GCS eye subscore is 4. GCS verbal subscore is 5. GCS motor subscore is 6.  Skin: Skin is warm, dry and intact.  Psychiatric: She has a normal mood and affect.    ED Course  Procedures (including critical care time) Labs Review Labs Reviewed - No data to display  Imaging Review No results found. I have personally reviewed and evaluated these images and lab results as part of my medical decision-making.   EKG Interpretation None      MDM   Final diagnoses:  Essential hypertension   Main  finding today is hypertension. Patient son reports she does not typically have hypertension but was hypertensive during her stay at [facility] for pneumonia. Clinically the patient appears to be significantly improved. She has no complaints and at this time findings are consistent with resolving pneumonia. She is not ill in appearance and oxygen saturation saturations are in the high 90s on room air. As the patient has had hypertension that was documented on the seventh and again today, she will be placed on a low-dose of hydrochlorothiazide and advised for close follow-up with her family doctor this week. At this time I do not clinically find indication for repeating diagnostic studies or hospitalization admission. The patient and family members are counseled on follow-up plan and signs and symptoms for which return.    Charlesetta Shanks, MD 12/11/15 [phone]

## 2015-12-13 DIAGNOSIS — J440 Chronic obstructive pulmonary disease with acute lower respiratory infection: Secondary | ICD-10-CM | POA: Diagnosis not present

## 2015-12-13 DIAGNOSIS — N183 Chronic kidney disease, stage 3 (moderate): Secondary | ICD-10-CM | POA: Diagnosis not present

## 2015-12-13 DIAGNOSIS — J189 Pneumonia, unspecified organism: Secondary | ICD-10-CM | POA: Diagnosis not present

## 2015-12-13 DIAGNOSIS — J441 Chronic obstructive pulmonary disease with (acute) exacerbation: Secondary | ICD-10-CM | POA: Diagnosis not present

## 2015-12-13 DIAGNOSIS — I129 Hypertensive chronic kidney disease with stage 1 through stage 4 chronic kidney disease, or unspecified chronic kidney disease: Secondary | ICD-10-CM | POA: Diagnosis not present

## 2015-12-13 DIAGNOSIS — E1140 Type 2 diabetes mellitus with diabetic neuropathy, unspecified: Secondary | ICD-10-CM | POA: Diagnosis not present

## 2015-12-13 DIAGNOSIS — E785 Hyperlipidemia, unspecified: Secondary | ICD-10-CM | POA: Diagnosis not present

## 2015-12-13 DIAGNOSIS — E1169 Type 2 diabetes mellitus with other specified complication: Secondary | ICD-10-CM | POA: Diagnosis not present

## 2015-12-13 DIAGNOSIS — E1122 Type 2 diabetes mellitus with diabetic chronic kidney disease: Secondary | ICD-10-CM | POA: Diagnosis not present

## 2015-12-14 DIAGNOSIS — Z6825 Body mass index (BMI) 25.0-25.9, adult: Secondary | ICD-10-CM | POA: Diagnosis not present

## 2015-12-14 DIAGNOSIS — J189 Pneumonia, unspecified organism: Secondary | ICD-10-CM | POA: Diagnosis not present

## 2015-12-14 DIAGNOSIS — E1122 Type 2 diabetes mellitus with diabetic chronic kidney disease: Secondary | ICD-10-CM | POA: Diagnosis not present

## 2015-12-14 DIAGNOSIS — R509 Fever, unspecified: Secondary | ICD-10-CM | POA: Diagnosis not present

## 2015-12-14 DIAGNOSIS — E1140 Type 2 diabetes mellitus with diabetic neuropathy, unspecified: Secondary | ICD-10-CM | POA: Diagnosis not present

## 2015-12-14 DIAGNOSIS — R600 Localized edema: Secondary | ICD-10-CM | POA: Diagnosis not present

## 2015-12-14 DIAGNOSIS — N183 Chronic kidney disease, stage 3 (moderate): Secondary | ICD-10-CM | POA: Diagnosis not present

## 2015-12-14 DIAGNOSIS — K5909 Other constipation: Secondary | ICD-10-CM | POA: Diagnosis not present

## 2015-12-14 DIAGNOSIS — E1169 Type 2 diabetes mellitus with other specified complication: Secondary | ICD-10-CM | POA: Diagnosis not present

## 2015-12-14 DIAGNOSIS — I129 Hypertensive chronic kidney disease with stage 1 through stage 4 chronic kidney disease, or unspecified chronic kidney disease: Secondary | ICD-10-CM | POA: Diagnosis not present

## 2015-12-14 DIAGNOSIS — E1151 Type 2 diabetes mellitus with diabetic peripheral angiopathy without gangrene: Secondary | ICD-10-CM | POA: Diagnosis not present

## 2015-12-14 DIAGNOSIS — E785 Hyperlipidemia, unspecified: Secondary | ICD-10-CM | POA: Diagnosis not present

## 2015-12-14 DIAGNOSIS — R2681 Unsteadiness on feet: Secondary | ICD-10-CM | POA: Diagnosis not present

## 2015-12-14 DIAGNOSIS — J440 Chronic obstructive pulmonary disease with acute lower respiratory infection: Secondary | ICD-10-CM | POA: Diagnosis not present

## 2015-12-14 DIAGNOSIS — J441 Chronic obstructive pulmonary disease with (acute) exacerbation: Secondary | ICD-10-CM | POA: Diagnosis not present

## 2015-12-15 DIAGNOSIS — E1169 Type 2 diabetes mellitus with other specified complication: Secondary | ICD-10-CM | POA: Diagnosis not present

## 2015-12-15 DIAGNOSIS — J440 Chronic obstructive pulmonary disease with acute lower respiratory infection: Secondary | ICD-10-CM | POA: Diagnosis not present

## 2015-12-15 DIAGNOSIS — J441 Chronic obstructive pulmonary disease with (acute) exacerbation: Secondary | ICD-10-CM | POA: Diagnosis not present

## 2015-12-15 DIAGNOSIS — E1140 Type 2 diabetes mellitus with diabetic neuropathy, unspecified: Secondary | ICD-10-CM | POA: Diagnosis not present

## 2015-12-15 DIAGNOSIS — E785 Hyperlipidemia, unspecified: Secondary | ICD-10-CM | POA: Diagnosis not present

## 2015-12-15 DIAGNOSIS — N183 Chronic kidney disease, stage 3 (moderate): Secondary | ICD-10-CM | POA: Diagnosis not present

## 2015-12-15 DIAGNOSIS — I129 Hypertensive chronic kidney disease with stage 1 through stage 4 chronic kidney disease, or unspecified chronic kidney disease: Secondary | ICD-10-CM | POA: Diagnosis not present

## 2015-12-15 DIAGNOSIS — J189 Pneumonia, unspecified organism: Secondary | ICD-10-CM | POA: Diagnosis not present

## 2015-12-15 DIAGNOSIS — E1122 Type 2 diabetes mellitus with diabetic chronic kidney disease: Secondary | ICD-10-CM | POA: Diagnosis not present

## 2015-12-18 DIAGNOSIS — N183 Chronic kidney disease, stage 3 (moderate): Secondary | ICD-10-CM | POA: Diagnosis not present

## 2015-12-18 DIAGNOSIS — E785 Hyperlipidemia, unspecified: Secondary | ICD-10-CM | POA: Diagnosis not present

## 2015-12-18 DIAGNOSIS — I129 Hypertensive chronic kidney disease with stage 1 through stage 4 chronic kidney disease, or unspecified chronic kidney disease: Secondary | ICD-10-CM | POA: Diagnosis not present

## 2015-12-18 DIAGNOSIS — J189 Pneumonia, unspecified organism: Secondary | ICD-10-CM | POA: Diagnosis not present

## 2015-12-18 DIAGNOSIS — J441 Chronic obstructive pulmonary disease with (acute) exacerbation: Secondary | ICD-10-CM | POA: Diagnosis not present

## 2015-12-18 DIAGNOSIS — E1122 Type 2 diabetes mellitus with diabetic chronic kidney disease: Secondary | ICD-10-CM | POA: Diagnosis not present

## 2015-12-18 DIAGNOSIS — J440 Chronic obstructive pulmonary disease with acute lower respiratory infection: Secondary | ICD-10-CM | POA: Diagnosis not present

## 2015-12-18 DIAGNOSIS — E1169 Type 2 diabetes mellitus with other specified complication: Secondary | ICD-10-CM | POA: Diagnosis not present

## 2015-12-18 DIAGNOSIS — E1140 Type 2 diabetes mellitus with diabetic neuropathy, unspecified: Secondary | ICD-10-CM | POA: Diagnosis not present

## 2015-12-19 ENCOUNTER — Ambulatory Visit: Payer: Commercial Managed Care - HMO | Admitting: Podiatry

## 2015-12-19 DIAGNOSIS — E1169 Type 2 diabetes mellitus with other specified complication: Secondary | ICD-10-CM | POA: Diagnosis not present

## 2015-12-19 DIAGNOSIS — J440 Chronic obstructive pulmonary disease with acute lower respiratory infection: Secondary | ICD-10-CM | POA: Diagnosis not present

## 2015-12-19 DIAGNOSIS — J441 Chronic obstructive pulmonary disease with (acute) exacerbation: Secondary | ICD-10-CM | POA: Diagnosis not present

## 2015-12-19 DIAGNOSIS — I129 Hypertensive chronic kidney disease with stage 1 through stage 4 chronic kidney disease, or unspecified chronic kidney disease: Secondary | ICD-10-CM | POA: Diagnosis not present

## 2015-12-19 DIAGNOSIS — E1122 Type 2 diabetes mellitus with diabetic chronic kidney disease: Secondary | ICD-10-CM | POA: Diagnosis not present

## 2015-12-19 DIAGNOSIS — E785 Hyperlipidemia, unspecified: Secondary | ICD-10-CM | POA: Diagnosis not present

## 2015-12-19 DIAGNOSIS — N183 Chronic kidney disease, stage 3 (moderate): Secondary | ICD-10-CM | POA: Diagnosis not present

## 2015-12-19 DIAGNOSIS — J189 Pneumonia, unspecified organism: Secondary | ICD-10-CM | POA: Diagnosis not present

## 2015-12-19 DIAGNOSIS — E1140 Type 2 diabetes mellitus with diabetic neuropathy, unspecified: Secondary | ICD-10-CM | POA: Diagnosis not present

## 2015-12-20 DIAGNOSIS — E1169 Type 2 diabetes mellitus with other specified complication: Secondary | ICD-10-CM | POA: Diagnosis not present

## 2015-12-20 DIAGNOSIS — I129 Hypertensive chronic kidney disease with stage 1 through stage 4 chronic kidney disease, or unspecified chronic kidney disease: Secondary | ICD-10-CM | POA: Diagnosis not present

## 2015-12-20 DIAGNOSIS — J440 Chronic obstructive pulmonary disease with acute lower respiratory infection: Secondary | ICD-10-CM | POA: Diagnosis not present

## 2015-12-20 DIAGNOSIS — E1122 Type 2 diabetes mellitus with diabetic chronic kidney disease: Secondary | ICD-10-CM | POA: Diagnosis not present

## 2015-12-20 DIAGNOSIS — J441 Chronic obstructive pulmonary disease with (acute) exacerbation: Secondary | ICD-10-CM | POA: Diagnosis not present

## 2015-12-20 DIAGNOSIS — J189 Pneumonia, unspecified organism: Secondary | ICD-10-CM | POA: Diagnosis not present

## 2015-12-20 DIAGNOSIS — E1140 Type 2 diabetes mellitus with diabetic neuropathy, unspecified: Secondary | ICD-10-CM | POA: Diagnosis not present

## 2015-12-20 DIAGNOSIS — E785 Hyperlipidemia, unspecified: Secondary | ICD-10-CM | POA: Diagnosis not present

## 2015-12-20 DIAGNOSIS — N183 Chronic kidney disease, stage 3 (moderate): Secondary | ICD-10-CM | POA: Diagnosis not present

## 2015-12-21 ENCOUNTER — Other Ambulatory Visit (HOSPITAL_COMMUNITY): Payer: Self-pay | Admitting: Internal Medicine

## 2015-12-21 DIAGNOSIS — N183 Chronic kidney disease, stage 3 (moderate): Secondary | ICD-10-CM | POA: Diagnosis not present

## 2015-12-21 DIAGNOSIS — R600 Localized edema: Secondary | ICD-10-CM

## 2015-12-21 DIAGNOSIS — E1169 Type 2 diabetes mellitus with other specified complication: Secondary | ICD-10-CM | POA: Diagnosis not present

## 2015-12-21 DIAGNOSIS — J440 Chronic obstructive pulmonary disease with acute lower respiratory infection: Secondary | ICD-10-CM | POA: Diagnosis not present

## 2015-12-21 DIAGNOSIS — E785 Hyperlipidemia, unspecified: Secondary | ICD-10-CM | POA: Diagnosis not present

## 2015-12-21 DIAGNOSIS — E1122 Type 2 diabetes mellitus with diabetic chronic kidney disease: Secondary | ICD-10-CM | POA: Diagnosis not present

## 2015-12-21 DIAGNOSIS — J441 Chronic obstructive pulmonary disease with (acute) exacerbation: Secondary | ICD-10-CM | POA: Diagnosis not present

## 2015-12-21 DIAGNOSIS — I129 Hypertensive chronic kidney disease with stage 1 through stage 4 chronic kidney disease, or unspecified chronic kidney disease: Secondary | ICD-10-CM | POA: Diagnosis not present

## 2015-12-21 DIAGNOSIS — J189 Pneumonia, unspecified organism: Secondary | ICD-10-CM | POA: Diagnosis not present

## 2015-12-21 DIAGNOSIS — E1140 Type 2 diabetes mellitus with diabetic neuropathy, unspecified: Secondary | ICD-10-CM | POA: Diagnosis not present

## 2015-12-22 ENCOUNTER — Ambulatory Visit (HOSPITAL_COMMUNITY)
Admission: RE | Admit: 2015-12-22 | Discharge: 2015-12-22 | Disposition: A | Discharge status: Home / Self Care | Payer: Commercial Managed Care - HMO | Source: Ambulatory Visit | Attending: Vascular Surgery | Admitting: Vascular Surgery

## 2015-12-22 DIAGNOSIS — R600 Localized edema: Secondary | ICD-10-CM | POA: Diagnosis not present

## 2015-12-22 DIAGNOSIS — I83892 Varicose veins of left lower extremities with other complications: Secondary | ICD-10-CM | POA: Insufficient documentation

## 2015-12-22 NOTE — Progress Notes (Signed)
Preliminary report phoned and given to Dr. Forde Dandy @ [place].

## 2015-12-26 DIAGNOSIS — N183 Chronic kidney disease, stage 3 (moderate): Secondary | ICD-10-CM | POA: Diagnosis not present

## 2015-12-26 DIAGNOSIS — E785 Hyperlipidemia, unspecified: Secondary | ICD-10-CM | POA: Diagnosis not present

## 2015-12-26 DIAGNOSIS — E1140 Type 2 diabetes mellitus with diabetic neuropathy, unspecified: Secondary | ICD-10-CM | POA: Diagnosis not present

## 2015-12-26 DIAGNOSIS — J441 Chronic obstructive pulmonary disease with (acute) exacerbation: Secondary | ICD-10-CM | POA: Diagnosis not present

## 2015-12-26 DIAGNOSIS — J440 Chronic obstructive pulmonary disease with acute lower respiratory infection: Secondary | ICD-10-CM | POA: Diagnosis not present

## 2015-12-26 DIAGNOSIS — J189 Pneumonia, unspecified organism: Secondary | ICD-10-CM | POA: Diagnosis not present

## 2015-12-26 DIAGNOSIS — E1122 Type 2 diabetes mellitus with diabetic chronic kidney disease: Secondary | ICD-10-CM | POA: Diagnosis not present

## 2015-12-26 DIAGNOSIS — I129 Hypertensive chronic kidney disease with stage 1 through stage 4 chronic kidney disease, or unspecified chronic kidney disease: Secondary | ICD-10-CM | POA: Diagnosis not present

## 2015-12-26 DIAGNOSIS — E1169 Type 2 diabetes mellitus with other specified complication: Secondary | ICD-10-CM | POA: Diagnosis not present

## 2015-12-27 DIAGNOSIS — J441 Chronic obstructive pulmonary disease with (acute) exacerbation: Secondary | ICD-10-CM | POA: Diagnosis not present

## 2015-12-27 DIAGNOSIS — E1122 Type 2 diabetes mellitus with diabetic chronic kidney disease: Secondary | ICD-10-CM | POA: Diagnosis not present

## 2015-12-27 DIAGNOSIS — E785 Hyperlipidemia, unspecified: Secondary | ICD-10-CM | POA: Diagnosis not present

## 2015-12-27 DIAGNOSIS — E1169 Type 2 diabetes mellitus with other specified complication: Secondary | ICD-10-CM | POA: Diagnosis not present

## 2015-12-27 DIAGNOSIS — E1140 Type 2 diabetes mellitus with diabetic neuropathy, unspecified: Secondary | ICD-10-CM | POA: Diagnosis not present

## 2015-12-27 DIAGNOSIS — I129 Hypertensive chronic kidney disease with stage 1 through stage 4 chronic kidney disease, or unspecified chronic kidney disease: Secondary | ICD-10-CM | POA: Diagnosis not present

## 2015-12-27 DIAGNOSIS — N183 Chronic kidney disease, stage 3 (moderate): Secondary | ICD-10-CM | POA: Diagnosis not present

## 2015-12-27 DIAGNOSIS — J440 Chronic obstructive pulmonary disease with acute lower respiratory infection: Secondary | ICD-10-CM | POA: Diagnosis not present

## 2015-12-27 DIAGNOSIS — J189 Pneumonia, unspecified organism: Secondary | ICD-10-CM | POA: Diagnosis not present

## 2015-12-28 DIAGNOSIS — E1140 Type 2 diabetes mellitus with diabetic neuropathy, unspecified: Secondary | ICD-10-CM | POA: Diagnosis not present

## 2015-12-28 DIAGNOSIS — J441 Chronic obstructive pulmonary disease with (acute) exacerbation: Secondary | ICD-10-CM | POA: Diagnosis not present

## 2015-12-28 DIAGNOSIS — E785 Hyperlipidemia, unspecified: Secondary | ICD-10-CM | POA: Diagnosis not present

## 2015-12-28 DIAGNOSIS — E1122 Type 2 diabetes mellitus with diabetic chronic kidney disease: Secondary | ICD-10-CM | POA: Diagnosis not present

## 2015-12-28 DIAGNOSIS — I129 Hypertensive chronic kidney disease with stage 1 through stage 4 chronic kidney disease, or unspecified chronic kidney disease: Secondary | ICD-10-CM | POA: Diagnosis not present

## 2015-12-28 DIAGNOSIS — E1169 Type 2 diabetes mellitus with other specified complication: Secondary | ICD-10-CM | POA: Diagnosis not present

## 2015-12-28 DIAGNOSIS — N183 Chronic kidney disease, stage 3 (moderate): Secondary | ICD-10-CM | POA: Diagnosis not present

## 2015-12-28 DIAGNOSIS — J189 Pneumonia, unspecified organism: Secondary | ICD-10-CM | POA: Diagnosis not present

## 2015-12-28 DIAGNOSIS — J440 Chronic obstructive pulmonary disease with acute lower respiratory infection: Secondary | ICD-10-CM | POA: Diagnosis not present

## 2016-01-03 DIAGNOSIS — E1122 Type 2 diabetes mellitus with diabetic chronic kidney disease: Secondary | ICD-10-CM | POA: Diagnosis not present

## 2016-01-03 DIAGNOSIS — J189 Pneumonia, unspecified organism: Secondary | ICD-10-CM | POA: Diagnosis not present

## 2016-01-03 DIAGNOSIS — E1169 Type 2 diabetes mellitus with other specified complication: Secondary | ICD-10-CM | POA: Diagnosis not present

## 2016-01-03 DIAGNOSIS — E1140 Type 2 diabetes mellitus with diabetic neuropathy, unspecified: Secondary | ICD-10-CM | POA: Diagnosis not present

## 2016-01-03 DIAGNOSIS — N183 Chronic kidney disease, stage 3 (moderate): Secondary | ICD-10-CM | POA: Diagnosis not present

## 2016-01-03 DIAGNOSIS — J440 Chronic obstructive pulmonary disease with acute lower respiratory infection: Secondary | ICD-10-CM | POA: Diagnosis not present

## 2016-01-03 DIAGNOSIS — I129 Hypertensive chronic kidney disease with stage 1 through stage 4 chronic kidney disease, or unspecified chronic kidney disease: Secondary | ICD-10-CM | POA: Diagnosis not present

## 2016-01-03 DIAGNOSIS — E785 Hyperlipidemia, unspecified: Secondary | ICD-10-CM | POA: Diagnosis not present

## 2016-01-03 DIAGNOSIS — J441 Chronic obstructive pulmonary disease with (acute) exacerbation: Secondary | ICD-10-CM | POA: Diagnosis not present

## 2016-01-05 DIAGNOSIS — N183 Chronic kidney disease, stage 3 (moderate): Secondary | ICD-10-CM | POA: Diagnosis not present

## 2016-01-05 DIAGNOSIS — J440 Chronic obstructive pulmonary disease with acute lower respiratory infection: Secondary | ICD-10-CM | POA: Diagnosis not present

## 2016-01-05 DIAGNOSIS — J441 Chronic obstructive pulmonary disease with (acute) exacerbation: Secondary | ICD-10-CM | POA: Diagnosis not present

## 2016-01-05 DIAGNOSIS — E1122 Type 2 diabetes mellitus with diabetic chronic kidney disease: Secondary | ICD-10-CM | POA: Diagnosis not present

## 2016-01-05 DIAGNOSIS — E1140 Type 2 diabetes mellitus with diabetic neuropathy, unspecified: Secondary | ICD-10-CM | POA: Diagnosis not present

## 2016-01-05 DIAGNOSIS — I129 Hypertensive chronic kidney disease with stage 1 through stage 4 chronic kidney disease, or unspecified chronic kidney disease: Secondary | ICD-10-CM | POA: Diagnosis not present

## 2016-01-05 DIAGNOSIS — J189 Pneumonia, unspecified organism: Secondary | ICD-10-CM | POA: Diagnosis not present

## 2016-01-05 DIAGNOSIS — E1169 Type 2 diabetes mellitus with other specified complication: Secondary | ICD-10-CM | POA: Diagnosis not present

## 2016-01-05 DIAGNOSIS — E785 Hyperlipidemia, unspecified: Secondary | ICD-10-CM | POA: Diagnosis not present

## 2016-01-10 DIAGNOSIS — E1169 Type 2 diabetes mellitus with other specified complication: Secondary | ICD-10-CM | POA: Diagnosis not present

## 2016-01-10 DIAGNOSIS — E1122 Type 2 diabetes mellitus with diabetic chronic kidney disease: Secondary | ICD-10-CM | POA: Diagnosis not present

## 2016-01-10 DIAGNOSIS — I129 Hypertensive chronic kidney disease with stage 1 through stage 4 chronic kidney disease, or unspecified chronic kidney disease: Secondary | ICD-10-CM | POA: Diagnosis not present

## 2016-01-10 DIAGNOSIS — J189 Pneumonia, unspecified organism: Secondary | ICD-10-CM | POA: Diagnosis not present

## 2016-01-10 DIAGNOSIS — E785 Hyperlipidemia, unspecified: Secondary | ICD-10-CM | POA: Diagnosis not present

## 2016-01-10 DIAGNOSIS — J440 Chronic obstructive pulmonary disease with acute lower respiratory infection: Secondary | ICD-10-CM | POA: Diagnosis not present

## 2016-01-10 DIAGNOSIS — N183 Chronic kidney disease, stage 3 (moderate): Secondary | ICD-10-CM | POA: Diagnosis not present

## 2016-01-10 DIAGNOSIS — J441 Chronic obstructive pulmonary disease with (acute) exacerbation: Secondary | ICD-10-CM | POA: Diagnosis not present

## 2016-01-10 DIAGNOSIS — E1140 Type 2 diabetes mellitus with diabetic neuropathy, unspecified: Secondary | ICD-10-CM | POA: Diagnosis not present

## 2016-01-12 DIAGNOSIS — I129 Hypertensive chronic kidney disease with stage 1 through stage 4 chronic kidney disease, or unspecified chronic kidney disease: Secondary | ICD-10-CM | POA: Diagnosis not present

## 2016-01-12 DIAGNOSIS — E1140 Type 2 diabetes mellitus with diabetic neuropathy, unspecified: Secondary | ICD-10-CM | POA: Diagnosis not present

## 2016-01-12 DIAGNOSIS — J441 Chronic obstructive pulmonary disease with (acute) exacerbation: Secondary | ICD-10-CM | POA: Diagnosis not present

## 2016-01-12 DIAGNOSIS — E1122 Type 2 diabetes mellitus with diabetic chronic kidney disease: Secondary | ICD-10-CM | POA: Diagnosis not present

## 2016-01-12 DIAGNOSIS — E1169 Type 2 diabetes mellitus with other specified complication: Secondary | ICD-10-CM | POA: Diagnosis not present

## 2016-01-12 DIAGNOSIS — N183 Chronic kidney disease, stage 3 (moderate): Secondary | ICD-10-CM | POA: Diagnosis not present

## 2016-01-12 DIAGNOSIS — J440 Chronic obstructive pulmonary disease with acute lower respiratory infection: Secondary | ICD-10-CM | POA: Diagnosis not present

## 2016-01-12 DIAGNOSIS — J189 Pneumonia, unspecified organism: Secondary | ICD-10-CM | POA: Diagnosis not present

## 2016-01-12 DIAGNOSIS — E785 Hyperlipidemia, unspecified: Secondary | ICD-10-CM | POA: Diagnosis not present

## 2016-01-15 ENCOUNTER — Other Ambulatory Visit: Payer: Self-pay

## 2016-01-15 DIAGNOSIS — Z794 Long term (current) use of insulin: Principal | ICD-10-CM

## 2016-01-15 DIAGNOSIS — E119 Type 2 diabetes mellitus without complications: Secondary | ICD-10-CM

## 2016-01-15 NOTE — Patient Outreach (Addendum)
[facility]) Care Management  01/15/2016  [name] [name] September 19, 1915 [id number]   Telephone Screen  Referral Date: 01/10/16 Referral Source: [place] Referral Reason:"Mbr with dx of DM,COPD, HTN. BS are ranged  160-200"  Outreach attempt # 1 to patient. Patient reached and screening completed.  Social: Patient resides in her home alone. She has a paid caregiver that comes to assist her daily and stays until 6pm. Patient's son stays nearby and assists as needed. She reports that she stopped driving last year and relies on paid caregiver to take her to appts. No recent falls. DME in the home include walker and shower chair. .  Conditions: Patient has h/o CKD stage 3, hypothyroidism, COPD and DM. She is checking blood sugars once a day. She recalls that cbgs range from 120-200. Patient reports at times her blood sugar may run over 200 if she chooses to eat something she shouldn't. Patient voices that paid CG is helping her make better food choices. She recalls last hypoglycemic event was about a week ago. Patient aware of s/s and how to treat.  Medications: Patient denies any issues with affording meds. She is able to manage meds at this time. She states she takes a lot of vitamins/supplements.   Appointments: Patient reports that she just changed MDs this year. She is now seeing Dr. Philip Aspen.   Consent: Patient gave verbal consent for [facility] services. She is agreeable to health coach for further diabetes education and support.   Plan: RN CM will notify administrative assistant of case status. RN CM will send [facility] health coach referral for further Diabetes education. RN CM will send [facility] pharmacy referral for polypharmacy med review.    Enzo Montgomery, RN,BSN,CCM [place] Management Telephonic Care Management Coordinator Direct Phone: [phone] Toll Free: [phone] Fax: [phone]

## 2016-01-19 ENCOUNTER — Other Ambulatory Visit: Payer: Self-pay

## 2016-01-19 NOTE — Patient Outreach (Signed)
[facility]) Care Management  01/19/2016  [name] Apr 18, 1915 [id number]   Telephone call to patient for initial health coach assessment. No answer HIPAA compliant voice message left.    Plan: RN Health Coach will attempt patient within 1-2 weeks.    [name], RN, MSN [name] [phone]

## 2016-01-24 ENCOUNTER — Other Ambulatory Visit: Payer: Self-pay

## 2016-01-24 NOTE — Patient Outreach (Signed)
[facility]) Care Management  [place]  01/24/2016   [name] [birth year] [id number]  Subjective: Telephone call to patient for health coach initial assessment.  Patient receptive to call.  Patient reports she lives alone but has an aide two hours in the morning and two hours in the evening.  She reports that her son is supportive but is having some health issues of his own. Patient reports she is independent with activities of daily living but it just takes her a while.  Patient to see her new primary care doctor next month for the first time.   Diabetes: Patient reports that her blood sugars are under better control now as previously.  Patient reports that her blood sugar today was 171.  Discussed with patient blood sugar and A1c goal.  Patient reports she is not sure what her new primary care doctor will set as her goal but right now is not aware of a goal.  Discussed with patient importance of diet in controlling her diabetes.  She verbalized understanding.   Objective:   Current Medications:  Current Outpatient Prescriptions  Medication Sig Dispense Refill  . ACCU-CHEK AVIVA PLUS test strip CHECK BLOOD SUGAR THREE TIMES DAILY 300 each 1  . ACCU-CHEK FASTCLIX LANCETS MISC     . aspirin 81 MG tablet Take 81 mg by mouth daily.    . Calcium Carbonate-Vitamin D (CALTRATE 600+D) 600-400 MG-UNIT per tablet Take 1 tablet by mouth daily.    . ferrous sulfate 325 (65 FE) MG tablet Take 325 mg by mouth daily with breakfast.    . glimepiride (AMARYL) 2 MG tablet TAKE 1 TABLET BY MOUTH EVERY MORNING AND 1/2 TABLET AT DINNER 135 tablet 1  . hydrochlorothiazide (HYDRODIURIL) 25 MG tablet Take 0.5 tablets (12.5 mg total) by mouth daily. 30 tablet 0  . insulin aspart (NOVOLOG FLEXPEN) 100 UNIT/ML FlexPen If blood sugar is over 200, take 5 units and if they go over 300, take 6 units at that meal. (Patient taking differently: Inject 3-6 Units into the skin daily. 3 units  before meals) 15 mL 2  . Insulin Pen Needle (NOVOFINE) 32G X 6 MM MISC Use as directed 50 each 3  . Lancets Misc. (ACCU-CHEK FASTCLIX LANCET) KIT USE AS DIRECTED FOR TESTING 1 kit 1  . LYRICA 75 MG capsule TAKE 1 CAPSULE TWICE DAILY 180 capsule 1  . Multiple Vitamins-Minerals (CENTRUM SILVER PO) Take 1 tablet by mouth.    . pantoprazole (PROTONIX) 40 MG tablet TAKE 1 TABLET BY MOUTH TWICE DAILY 180 tablet 0  . pravastatin (PRAVACHOL) 20 MG tablet Take 20 mg by mouth daily.    Marland Kitchen SYNTHROID 88 MCG tablet TAKE 1 TABLET DAILY 90 tablet 3  . traMADol (ULTRAM) 50 MG tablet Take 1 tablet (50 mg total) by mouth 2 (two) times daily. (Patient taking differently: Take 50 mg by mouth every 12 (twelve) hours as needed for moderate pain. ) 60 tablet 3  . vitamin B-12 (CYANOCOBALAMIN) 1000 MCG tablet Take 1,000 mcg by mouth daily.    Marland Kitchen levofloxacin (LEVAQUIN) 500 MG tablet Take 1 tablet (500 mg total) by mouth daily. 3 tablet 0   No current facility-administered medications for this visit.    Functional Status:  In your present state of health, do you have any difficulty performing the following activities: 01/24/2016 12/06/2015  Hearing? N Y  Vision? N N  Difficulty concentrating or making decisions? N N  Walking or climbing stairs? [name]  Dressing or bathing? N Y  Doing errands, shopping? [name]  Preparing Food and eating ? N -  Using the Toilet? N -  In the past six months, have you accidently leaked urine? Y -  Do you have problems with loss of bowel control? N -  Managing your Medications? N -  Managing your Finances? Y -  Housekeeping or managing your Housekeeping? Y -    Fall/Depression Screening: PHQ 2/9 Scores 01/24/2016 04/06/2015  PHQ - 2 Score 0 0    Assessment: Patient will benefit from health coach outreach for education on diabetes for continued self-management.  Plan:  [facility] CM Care Plan Problem One        Most Recent Value   Care Plan Problem One  Diabetes   Role Documenting the  Problem One  Health Coach   Care Plan for Problem One  Active   THN Long Term Goal (31-90 days)  Patient will keep A1c less than 7 within 90 days   THN Long Term Goal Start Date  01/24/16   Interventions for Problem One Outlook discussed with patient importance of A1c.     THN CM Short Term Goal #1 (0-30 days)  Patient will be able to name foods high in carbohydrates within 30 days.    THN CM Short Term Goal #1 Start Date  01/24/16   Interventions for Short Term Goal #1  RN Health Coach discussed with  patient foods high in carbohydrates.    THN CM Short Term Goal #2 (0-30 days)  Patient will be able to name signs and symptoms of hypoglycemia within 30 days.    THN CM Short Term Goal #2 Start Date  01/24/16   Interventions for Short Term Goal #2  [place] discussed with patient signs and symptoms of hypoglycemia.       RN Health Coach will provide ongoing education for patient on diabetes through phone calls and sending printed information to patient for further discussion.  RN Health Coach will send welcome packet with consent to patient as well as printed information on diabetes.  RN Health Coach will send initial barriers letter, assessment, and care plan to primary care physician.  RN Health Coach will contact patient within one month and patient agrees to next contact.   [name], RN, MSN [place] [phone]

## 2016-01-29 ENCOUNTER — Other Ambulatory Visit: Payer: Self-pay | Admitting: Endocrinology

## 2016-02-07 ENCOUNTER — Other Ambulatory Visit: Payer: Self-pay | Admitting: Endocrinology

## 2016-02-12 ENCOUNTER — Other Ambulatory Visit: Payer: Self-pay | Admitting: Pharmacist

## 2016-02-12 NOTE — Patient Outreach (Signed)
[facility]) Care Management  [place]   02/12/2016  Carlos Quackenbush Tinnell [birth year] [id number]  Subjective: [name] is a 82yo who was referred to [place] for medication review.  I made outreach call to patient to review medications.  Patient reports she has a caregiver assist in filling her pill box for her.  Patient reports compliance with medications.  Patient denies any issues affording her medications.  She reports taking insulin if needed and has a sliding scale she uses.  Patient reports blood glucose this morning was 227m/dL.  She reports blood glucose readings are usually less than 200 mg/dL.  Patient endorses occasional hypoglycemia and was able to verbalize appropriate hypoglycemia management plan.  I reviewed the rule of 15 for hypoglycemia management with patient.  Patient voiced understanding.    Objective:   Current Medications:  Current Outpatient Prescriptions  Medication Sig Dispense Refill  . ACCU-CHEK AVIVA PLUS test strip CHECK BLOOD SUGAR THREE TIMES DAILY 300 each 1  . ACCU-CHEK FASTCLIX LANCETS MISC     . aspirin 81 MG tablet Take 81 mg by mouth daily.    . Calcium Carbonate-Vitamin D (CALTRATE 600+D) 600-400 MG-UNIT per tablet Take 1 tablet by mouth daily.    . ferrous sulfate 325 (65 FE) MG tablet Take 325 mg by mouth daily with breakfast.    . glimepiride (AMARYL) 2 MG tablet TAKE 1 TABLET BY MOUTH EVERY MORNING AND 1/2 TABLET AT DINNER 135 tablet 1  . hydrochlorothiazide (HYDRODIURIL) 25 MG tablet Take 0.5 tablets (12.5 mg total) by mouth daily. 30 tablet 0  . insulin aspart (NOVOLOG FLEXPEN) 100 UNIT/ML FlexPen If blood sugar is over 200, take 5 units and if they go over 300, take 6 units at that meal. (Patient taking differently: Inject 3-6 Units into the skin daily. 3 units before meals) 15 mL 2  . Insulin Pen Needle (NOVOFINE) 32G X 6 MM MISC Use as directed 50 each 3  . Lancets Misc. (ACCU-CHEK FASTCLIX LANCET) KIT USE AS  DIRECTED FOR TESTING 1 kit 1  . LYRICA 75 MG capsule TAKE 1 CAPSULE TWICE DAILY 180 capsule 1  . Multiple Vitamins-Minerals (CENTRUM SILVER PO) Take 1 tablet by mouth.    . pantoprazole (PROTONIX) 40 MG tablet TAKE 1 TABLET BY MOUTH TWICE DAILY 180 tablet 0  . pravastatin (PRAVACHOL) 20 MG tablet Take 20 mg by mouth daily.    .Marland KitchenSYNTHROID 88 MCG tablet TAKE 1 TABLET DAILY 90 tablet 3  . traMADol (ULTRAM) 50 MG tablet Take 1 tablet (50 mg total) by mouth 2 (two) times daily. (Patient taking differently: Take 50 mg by mouth every 12 (twelve) hours as needed for moderate pain. ) 60 tablet 3  . vitamin B-12 (CYANOCOBALAMIN) 1000 MCG tablet Take 1,000 mcg by mouth daily.     No current facility-administered medications for this visit.    Functional Status: In your present state of health, do you have any difficulty performing the following activities: 01/24/2016 12/06/2015  Hearing? N Y  Vision? N N  Difficulty concentrating or making decisions? N N  Walking or climbing stairs? Y Y  Dressing or bathing? N Y  Doing errands, shopping? YTempie Donning Preparing Food and eating ? N -  Using the Toilet? N -  In the past six months, have you accidently leaked urine? Y -  Do you have problems with loss of bowel control? N -  Managing your Medications? N -  Managing your Finances?  Y -  Housekeeping or managing your Housekeeping? Y -    Fall/Depression Screening: PHQ 2/9 Scores 01/24/2016 04/06/2015  PHQ - 2 Score 0 0    Assessment: 1.  Medication review:  Drugs sorted by system:  Neurologic/Psychologic: none  Cardiovascular: aspirin, hydrochlorothiazide, pravastatin  Pulmonary/Allergy: none  Gastrointestinal: pantoprazole  Endocrine: glimepiride, Novolog, levothyroxine  Renal: none  Topical: none  Pain: pregabalin, tramadol  Vitamins/Minerals: calcium-vitamin D, cyanocobalamin, ferrous sulfate, multivitamin  Infectious Diseases: none  Miscellaneous: none   Duplications in therapy:  none noted Gaps in therapy: ACE inhibitor or ARB for diabetes - will not recommend based on patient's age and patient's most recent microalbumin to creatinine rate was 2.8 Medications to avoid in the elderly: insulin, sliding scale (higher risk of hypoglycemia without improvement in hyperglycemia management regardless or care setting), pantoprazole (risk of Clostridium difficile infection and bone loss and fractures) Drug interactions: none noted Other issues noted: none - all medications appropriately dosed based on patient's renal function   Plan: 1.  Medication review:  Patient is currently prescribed sliding scale insulin with meals.  Sliding scale insulin has higher risk of hypoglycemia without improvement in hyperglycemia management.  Per beers criteria, it is recommended to avoid sliding scale insulin in older adults.  Please continue to weigh benefits versus risk of sliding scale insulin.  Will send a fax to patient's PCP with this information.  No other issues noted.  Will close pharmacy program.  Will alert [facility].     Elisabeth Most, Pharm.D. Pharmacy Resident [place] [phone]

## 2016-02-19 ENCOUNTER — Emergency Department (HOSPITAL_COMMUNITY)
Admission: EM | Admit: 2016-02-19 | Discharge: 2016-02-19 | Disposition: A | Discharge status: Home / Self Care | Payer: Commercial Managed Care - HMO | Attending: Emergency Medicine | Admitting: Emergency Medicine

## 2016-02-19 ENCOUNTER — Encounter (HOSPITAL_COMMUNITY): Payer: Self-pay | Admitting: *Deleted

## 2016-02-19 DIAGNOSIS — Z7982 Long term (current) use of aspirin: Secondary | ICD-10-CM | POA: Diagnosis not present

## 2016-02-19 DIAGNOSIS — R739 Hyperglycemia, unspecified: Secondary | ICD-10-CM

## 2016-02-19 DIAGNOSIS — R1032 Left lower quadrant pain: Secondary | ICD-10-CM | POA: Diagnosis not present

## 2016-02-19 DIAGNOSIS — R1012 Left upper quadrant pain: Secondary | ICD-10-CM | POA: Diagnosis not present

## 2016-02-19 DIAGNOSIS — Z7984 Long term (current) use of oral hypoglycemic drugs: Secondary | ICD-10-CM | POA: Insufficient documentation

## 2016-02-19 DIAGNOSIS — E079 Disorder of thyroid, unspecified: Secondary | ICD-10-CM | POA: Insufficient documentation

## 2016-02-19 DIAGNOSIS — R42 Dizziness and giddiness: Secondary | ICD-10-CM | POA: Insufficient documentation

## 2016-02-19 DIAGNOSIS — I10 Essential (primary) hypertension: Secondary | ICD-10-CM | POA: Diagnosis not present

## 2016-02-19 DIAGNOSIS — Z79899 Other long term (current) drug therapy: Secondary | ICD-10-CM | POA: Insufficient documentation

## 2016-02-19 DIAGNOSIS — Z794 Long term (current) use of insulin: Secondary | ICD-10-CM | POA: Diagnosis not present

## 2016-02-19 DIAGNOSIS — E1165 Type 2 diabetes mellitus with hyperglycemia: Secondary | ICD-10-CM | POA: Insufficient documentation

## 2016-02-19 DIAGNOSIS — R7309 Other abnormal glucose: Secondary | ICD-10-CM | POA: Diagnosis not present

## 2016-02-19 HISTORY — DX: Essential (primary) hypertension: I10

## 2016-02-19 LAB — I-STAT VENOUS BLOOD GAS, ED
ACID-BASE EXCESS: 2 mmol/L (ref 0.0–2.0)
Bicarbonate: 27.2 mEq/L — ABNORMAL HIGH (ref 20.0–24.0)
O2 SAT: 43 %
PCO2 VEN: 44.5 mmHg — AB (ref 45.0–50.0)
TCO2: 29 mmol/L (ref 0–100)
pH, Ven: 7.394 — ABNORMAL HIGH (ref 7.250–7.300)
pO2, Ven: 25 mmHg — CL (ref 30.0–45.0)

## 2016-02-19 LAB — URINALYSIS, ROUTINE W REFLEX MICROSCOPIC
Bilirubin Urine: NEGATIVE
GLUCOSE, UA: 500 mg/dL — AB
HGB URINE DIPSTICK: NEGATIVE
KETONES UR: NEGATIVE mg/dL
LEUKOCYTES UA: NEGATIVE
Nitrite: NEGATIVE
PROTEIN: 100 mg/dL — AB
Specific Gravity, Urine: 1.015 (ref 1.005–1.030)
pH: 7.5 (ref 5.0–8.0)

## 2016-02-19 LAB — CBC WITH DIFFERENTIAL/PLATELET
Basophils Absolute: 0 10*3/uL (ref 0.0–0.1)
Basophils Relative: 0 %
EOS PCT: 1 %
Eosinophils Absolute: 0.1 10*3/uL (ref 0.0–0.7)
HCT: 37.7 % (ref 36.0–46.0)
Hemoglobin: 12.4 g/dL (ref 12.0–15.0)
LYMPHS PCT: 30 %
Lymphs Abs: 1.9 10*3/uL (ref 0.7–4.0)
MCH: 31.9 pg (ref 26.0–34.0)
MCHC: 32.9 g/dL (ref 30.0–36.0)
MCV: 96.9 fL (ref 78.0–100.0)
MONO ABS: 0.6 10*3/uL (ref 0.1–1.0)
Monocytes Relative: 9 %
Neutro Abs: 3.9 10*3/uL (ref 1.7–7.7)
Neutrophils Relative %: 60 %
PLATELETS: 151 10*3/uL (ref 150–400)
RBC: 3.89 MIL/uL (ref 3.87–5.11)
RDW: 12.4 % (ref 11.5–15.5)
WBC: 6.5 10*3/uL (ref 4.0–10.5)

## 2016-02-19 LAB — URINE MICROSCOPIC-ADD ON: RBC / HPF: NONE SEEN RBC/hpf (ref 0–5)

## 2016-02-19 LAB — COMPREHENSIVE METABOLIC PANEL
ALBUMIN: 3.5 g/dL (ref 3.5–5.0)
ALK PHOS: 68 U/L (ref 38–126)
ALT: 22 U/L (ref 14–54)
AST: 18 U/L (ref 15–41)
Anion gap: 13 (ref 5–15)
BILIRUBIN TOTAL: 0.7 mg/dL (ref 0.3–1.2)
BUN: 21 mg/dL — AB (ref 6–20)
CALCIUM: 9.4 mg/dL (ref 8.9–10.3)
CO2: 23 mmol/L (ref 22–32)
CREATININE: 1.3 mg/dL — AB (ref 0.44–1.00)
Chloride: 102 mmol/L (ref 101–111)
GFR calc Af Amer: 38 mL/min — ABNORMAL LOW (ref >60)
GFR calc non Af Amer: 33 mL/min — ABNORMAL LOW (ref >60)
GLUCOSE: 274 mg/dL — AB (ref 65–99)
POTASSIUM: 4.1 mmol/L (ref 3.5–5.1)
Sodium: 138 mmol/L (ref 135–145)
TOTAL PROTEIN: 6.1 g/dL — AB (ref 6.5–8.1)

## 2016-02-19 LAB — I-STAT CG4 LACTIC ACID, ED: LACTIC ACID, VENOUS: 1.06 mmol/L (ref 0.5–2.0)

## 2016-02-19 MED ORDER — ONDANSETRON HCL 4 MG/2ML IJ SOLN
4.0000 mg | Freq: Once | INTRAMUSCULAR | Status: AC
  Administered 2016-02-19: 4 mg via INTRAVENOUS
  Filled 2016-02-19: qty 2

## 2016-02-19 MED ORDER — SODIUM CHLORIDE 0.9 % IV BOLUS (SEPSIS)
1000.0000 mL | Freq: Once | INTRAVENOUS | Status: AC
  Administered 2016-02-19: 1000 mL via INTRAVENOUS

## 2016-02-19 MED ORDER — HYDROCHLOROTHIAZIDE 25 MG PO TABS
12.5000 mg | ORAL_TABLET | Freq: Once | ORAL | Status: AC
  Administered 2016-02-19: 12.5 mg via ORAL
  Filled 2016-02-19: qty 1

## 2016-02-19 NOTE — ED Provider Notes (Signed)
CSN: [id number]     Arrival date & time 02/19/16  2863 History   First MD Initiated Contact with Patient 02/19/16 [phone]     Chief Complaint  Patient presents with  . Hypertension  . Multiple Complaints      (Consider location/radiation/quality/duration/timing/severity/associated sxs/prior Treatment) HPI Comments: 82yo F w/ PMH including IDDM, HTN who p/w lightheadedness. Pt reports that she is not certain why she called EMS this morning. She does note that when she was sitting in bed this morning, she felt lightheaded like she was going to faint, so she took several sugar pills thinking that her BG was low. She did not lose consciousness and denies any recent falls or head injury. She states she forgot to take her evening medications last night but did have her medications yesterday morning and the previous day. She denies any cough/cold sx, vomiting, diarrhea, abd pain, chest pain, or shortness of breath. She endorses polyuria but no dysuria.  Patient is a 82 y.o. female presenting with hypertension. The history is provided by the patient and a relative.  Hypertension    Past Medical History  Diagnosis Date  . Diabetes mellitus without complication ([place])   . Hypertension   . Thyroid disease    Past Surgical History  Procedure Laterality Date  . Hip fracture surgery Bilateral   . Fracture surgery    . Tonsillectomy    . Cataract extraction w/ intraocular lens  implant, bilateral Bilateral   . Esophagogastroduodenoscopy N/A 03/03/2015    Procedure: ESOPHAGOGASTRODUODENOSCOPY (EGD);  Surgeon: Inda Castle, MD;  Location: [facility];  Service: Endoscopy;  Laterality: N/A;  . Esophagogastroduodenoscopy N/A 03/07/2015    Procedure: ESOPHAGOGASTRODUODENOSCOPY (EGD);  Surgeon: Jerene Bears, MD;  Location: [facility] ENDOSCOPY;  Service: Endoscopy;  Laterality: N/A;   Family History  Problem Relation Age of Onset  . Diabetes Father    Social History  Substance Use Topics  . Smoking status:  Never Smoker   . Smokeless tobacco: Never Used  . Alcohol Use: Yes     Comment: "I have had some alcohol in my younger days; a long time ago; when I was young"   OB History    No data available     Review of Systems  10 Systems reviewed and are negative for acute change except as noted in the HPI.   Allergies  Other  Home Medications   Prior to Admission medications   Medication Sig Start Date End Date Taking? Authorizing Provider  ACCU-CHEK AVIVA PLUS test strip CHECK BLOOD SUGAR THREE TIMES DAILY 07/12/15   Elayne Snare, MD  ACCU-CHEK FASTCLIX LANCETS [place]  11/23/14   Historical Provider, MD  aspirin 81 MG tablet Take 81 mg by mouth daily.    Historical Provider, MD  Calcium Carbonate-Vitamin D (CALTRATE 600+D) 600-400 MG-UNIT per tablet Take 1 tablet by mouth daily.    Historical Provider, MD  ferrous sulfate 325 (65 FE) MG tablet Take 325 mg by mouth daily with breakfast.    Historical Provider, MD  glimepiride (AMARYL) 2 MG tablet TAKE 1 TABLET BY MOUTH EVERY MORNING AND 1/2 TABLET AT [facility] 07/17/15   Elayne Snare, MD  hydrochlorothiazide (HYDRODIURIL) 25 MG tablet Take 0.5 tablets (12.5 mg total) by mouth daily. 12/11/15   Charlesetta Shanks, MD  insulin aspart (NOVOLOG FLEXPEN) 100 UNIT/ML FlexPen If blood sugar is over 200, take 5 units and if they go over 300, take 6 units at that meal. Patient taking differently: Inject 3-6 Units into the  skin daily. 3 units before meals 02/16/15   Elayne Snare, MD  Insulin Pen Needle (NOVOFINE) 32G X 6 MM MISC Use as directed 11/21/14   Elayne Snare, MD  Lancets Misc. (ACCU-CHEK FASTCLIX LANCET) KIT USE AS DIRECTED FOR TESTING 11/23/14   Elayne Snare, MD  LYRICA 75 MG capsule TAKE 1 CAPSULE TWICE DAILY 01/29/16   Elayne Snare, MD  Multiple Vitamins-Minerals (CENTRUM SILVER PO) Take 1 tablet by mouth.    Historical Provider, MD  pantoprazole (PROTONIX) 40 MG tablet TAKE 1 TABLET BY MOUTH TWICE DAILY 09/08/15   Elayne Snare, MD  pravastatin (PRAVACHOL) 20 MG  tablet Take 20 mg by mouth daily. 06/25/15   Historical Provider, MD  SYNTHROID 88 MCG tablet TAKE 1 TABLET DAILY 08/18/15   Elayne Snare, MD  traMADol (ULTRAM) 50 MG tablet Take 1 tablet (50 mg total) by mouth 2 (two) times daily. Patient taking differently: Take 50 mg by mouth every 12 (twelve) hours as needed for moderate pain.  09/15/15   Elayne Snare, MD  vitamin B-12 (CYANOCOBALAMIN) 1000 MCG tablet Take 1,000 mcg by mouth daily.    Historical Provider, MD   BP 186/58 mmHg  Pulse 58  Temp(Src) 97.6 F (36.4 C) (Oral)  Resp 13  Wt 140 lb (63.504 kg)  SpO2 98% Physical Exam  Constitutional: She is oriented to person, place, and time. She appears well-developed and well-nourished. No distress.  HENT:  Head: Normocephalic and atraumatic.  Mouth/Throat: Oropharynx is clear and moist.  Moist mucous membranes  Eyes: Conjunctivae are normal. Pupils are equal, round, and reactive to light.  Neck: Neck supple.  Cardiovascular: Normal rate, regular rhythm and normal heart sounds.   No murmur heard. Pulmonary/Chest: Effort normal and breath sounds normal.  Abdominal: Soft. Bowel sounds are normal. She exhibits no distension. There is no rebound and no guarding.  Mild LUQ TTP  Musculoskeletal: She exhibits no edema.  Neurological: She is alert and oriented to person, place, and time.  Fluent speech  Skin: Skin is warm and dry.  Psychiatric: She has a normal mood and affect. Judgment normal.  Nursing note and vitals reviewed.   ED Course  Procedures (including critical care time) Labs Review Labs Reviewed  COMPREHENSIVE METABOLIC PANEL - Abnormal; Notable for the following:    Glucose, Bld 274 (*)    BUN 21 (*)    Creatinine, Ser 1.30 (*)    Total Protein 6.1 (*)    GFR calc non Af Amer 33 (*)    GFR calc Af Amer 38 (*)    All other components within normal limits  URINALYSIS, ROUTINE W REFLEX MICROSCOPIC (NOT AT [facility]) - Abnormal; Notable for the following:    Glucose, UA 500 (*)     Protein, ur 100 (*)    All other components within normal limits  URINE MICROSCOPIC-ADD ON - Abnormal; Notable for the following:    Squamous Epithelial / LPF 0-5 (*)    Bacteria, UA RARE (*)    All other components within normal limits  I-STAT VENOUS BLOOD GAS, ED - Abnormal; Notable for the following:    pH, Ven 7.394 (*)    pCO2, Ven 44.5 (*)    pO2, Ven 25.0 (*)    Bicarbonate 27.2 (*)    All other components within normal limits  CBC WITH DIFFERENTIAL/PLATELET  I-STAT CG4 LACTIC ACID, ED    Imaging Review No results found. I have personally reviewed and evaluated these lab results as part of my medical decision-making.  EKG Interpretation   Date/Time:  Monday February 19 2016 06:39:24 EST Ventricular Rate:  56 PR Interval:  303 QRS Duration: 140 QT Interval:  466 QTC Calculation: 450 R Axis:   -84 Text Interpretation:  Sinus or ectopic atrial rhythm Prolonged PR interval  RBBB and LAFB No significant change since last tracing Confirmed by HORTON   MD, COURTNEY ([id number]) on 02/19/2016 7:04:36 AM     Medications  hydrochlorothiazide (HYDRODIURIL) tablet 12.5 mg (12.5 mg Oral Given 02/19/16 0722)  sodium chloride 0.9 % bolus 1,000 mL (0 mLs Intravenous Stopped 02/19/16 1001)  ondansetron (ZOFRAN) injection 4 mg (4 mg Intravenous Given 02/19/16 1001)    MDM   Final diagnoses:  Lightheadedness  Hyperglycemia    Pt presents by EMS for lightheadedness that began this morning. She was severely hypertensive by EMS but on arrival was 182/71. Blood glucose 449. She was well-appearing with no complaints and unremarkable physical exam. Obtained above lab work to rule out DKA and gave the patient her home hydrochlorothiazide. EKG unchanged from previous.  Labwork showed creatinine 1.3, glucose 274, no evidence of DKA. On reexamination, the patient was comfortable. I personally watched her ambulate with a walker without problems in family stated that she was at her baseline. I  discussed supportive care and instructed to follow-up with PCP if patient has any further episodes of lightheadedness. Patient without complaints at time of discharge. Return precautions reviewed and patient discharged in satisfactory condition.   Sharlett Iles, MD 02/19/16 [phone]

## 2016-02-19 NOTE — ED Notes (Signed)
Patient presents via EMS.  EMS reports patient was unsure why she called.  Could have been because she was dizzy when she tried to get up to the BR and sat back down on the bed, thought her sugar may have been down and was eating sugar tablets.  Has not taken her meds since Saturday AM.  Patient lives by herself.  EMS reported BP 280/90 then 170/80, CBG 449

## 2016-02-19 NOTE — ED Notes (Signed)
Pt experienced generalized weakness while being assisted to bedside commode. MD Little notified.

## 2016-02-19 NOTE — ED Notes (Signed)
Patient physically moved to room D35 from Tra C; patient placed back on monitor, continuous pulse oximetry and blood pressure cuff; visitors at bedside

## 2016-02-19 NOTE — ED Notes (Signed)
MD made aware patient has finished bolus and is ready to be walked. Walker at the bedside. Waiting for MD to reassess.

## 2016-02-20 ENCOUNTER — Other Ambulatory Visit: Payer: Self-pay

## 2016-02-20 NOTE — Patient Outreach (Signed)
[facility]) Care Management  [place]  02/20/2016   [name] 05/28/1915 [id number]  Subjective: Telephone call to patient for monthly call.  Patient noted to have gone to the emergency room on yesterday. Patient reports that she had a spell of dizziness and thought her sugar was up and took 3 of her glucose tablets.  Patient reports she pressed her medical button and the ambulance came and got her and took her to the hospital.  Patient states by the time she got to the hospital her sugar was high. Patient reports she was treated and released.  Patient reports she feels some weak today but no dizziness.  She reports that her blood sugar today was 180.  Discussed with patient the importance of watching her diet and possibly checking her sugars in the evening for a few days. Patient reports she has an appointment with her primary doctor on next week.       Objective:   Current Medications:  Current Outpatient Prescriptions  Medication Sig Dispense Refill  . ACCU-CHEK AVIVA PLUS test strip CHECK BLOOD SUGAR THREE TIMES DAILY 300 each 1  . ACCU-CHEK FASTCLIX LANCETS MISC     . aspirin 81 MG tablet Take 81 mg by mouth daily.    . Calcium Carbonate-Vitamin D (CALTRATE 600+D) 600-400 MG-UNIT per tablet Take 1 tablet by mouth daily.    . ferrous sulfate 325 (65 FE) MG tablet Take 325 mg by mouth daily with breakfast.    . glimepiride (AMARYL) 2 MG tablet TAKE 1 TABLET BY MOUTH EVERY MORNING AND 1/2 TABLET AT DINNER 135 tablet 1  . hydrochlorothiazide (HYDRODIURIL) 25 MG tablet Take 0.5 tablets (12.5 mg total) by mouth daily. 30 tablet 0  . insulin aspart (NOVOLOG FLEXPEN) 100 UNIT/ML FlexPen If blood sugar is over 200, take 5 units and if they go over 300, take 6 units at that meal. (Patient taking differently: Inject 3-6 Units into the skin daily. 3 units before meals) 15 mL 2  . Insulin Pen Needle (NOVOFINE) 32G X 6 MM MISC Use as directed 50 each 3  . Lancets Misc.  (ACCU-CHEK FASTCLIX LANCET) KIT USE AS DIRECTED FOR TESTING 1 kit 1  . LYRICA 75 MG capsule TAKE 1 CAPSULE TWICE DAILY 180 capsule 1  . Multiple Vitamins-Minerals (CENTRUM SILVER PO) Take 1 tablet by mouth.    . pantoprazole (PROTONIX) 40 MG tablet TAKE 1 TABLET BY MOUTH TWICE DAILY 180 tablet 0  . pravastatin (PRAVACHOL) 20 MG tablet Take 20 mg by mouth daily.    Marland Kitchen SYNTHROID 88 MCG tablet TAKE 1 TABLET DAILY 90 tablet 3  . traMADol (ULTRAM) 50 MG tablet Take 1 tablet (50 mg total) by mouth 2 (two) times daily. (Patient taking differently: Take 50 mg by mouth every 12 (twelve) hours as needed for moderate pain. ) 60 tablet 3  . vitamin B-12 (CYANOCOBALAMIN) 1000 MCG tablet Take 1,000 mcg by mouth daily.     No current facility-administered medications for this visit.    Functional Status:  In your present state of health, do you have any difficulty performing the following activities: 01/24/2016 12/06/2015  Hearing? N Y  Vision? N N  Difficulty concentrating or making decisions? N N  Walking or climbing stairs? Y Y  Dressing or bathing? N Y  Doing errands, shopping? [name]  Preparing Food and eating ? N -  Using the Toilet? N -  In the past six months, have you accidently leaked  urine? Y -  Do you have problems with loss of bowel control? N -  Managing your Medications? N -  Managing your Finances? Y -  Housekeeping or managing your Housekeeping? Y -    Fall/Depression Screening: PHQ 2/9 Scores 02/20/2016 01/24/2016 04/06/2015  PHQ - 2 Score 0 0 0    Assessment: Patient continues to benefit from health coach outreach for disease management and support.    Plan:  [facility] CM Care Plan Problem One        Most Recent Value   Care Plan Problem One  Diabetes   Role Documenting the Problem One  Health [place] for Problem One  Active   THN Long Term Goal (31-90 days)  Patient will keep A1c less than 7 within 90 days   THN Long Term Goal Start Date  01/24/16   Interventions for  Problem One [place] reviewed with patient importance of A1c.     THN CM Short Term Goal #1 (0-30 days)  Patient will be able to name foods high in carbohydrates within 30 days.    THN CM Short Term Goal #1 Start Date  02/20/16 [goal continued]   Interventions for Short Term Goal #1  RN Health Coach reviewed with  patient foods high in carbohydrates.    THN CM Short Term Goal #2 (0-30 days)  Patient will be able to name signs and symptoms of hypoglycemia within 30 days.    THN CM Short Term Goal #2 Start Date  02/20/16 [goal continued]   Interventions for Short Term Goal #2  RN Health Coach reviewedd with patient signs and symptoms of hypoglycemia.     THN CM Short Term Goal #3 (0-30 days)  Patient will be able to name 3 signs and symptoms of hyperglycemia within 30 days.   THN CM Short Term Goal #3 Start Date  02/20/16   Interventions for Short [place] Goal #3  [place] reviewed with patient signs and symptoms of hyperglycemia.      RN Health Coach will contact patient within one month and patient agrees to next outreach.    [name], RN, MSN [place] [phone]

## 2016-02-27 DIAGNOSIS — I10 Essential (primary) hypertension: Secondary | ICD-10-CM | POA: Diagnosis not present

## 2016-02-27 DIAGNOSIS — I7389 Other specified peripheral vascular diseases: Secondary | ICD-10-CM | POA: Diagnosis not present

## 2016-02-27 DIAGNOSIS — Z6824 Body mass index (BMI) 24.0-24.9, adult: Secondary | ICD-10-CM | POA: Diagnosis not present

## 2016-02-27 DIAGNOSIS — E038 Other specified hypothyroidism: Secondary | ICD-10-CM | POA: Diagnosis not present

## 2016-02-27 DIAGNOSIS — R600 Localized edema: Secondary | ICD-10-CM | POA: Diagnosis not present

## 2016-02-27 DIAGNOSIS — E1151 Type 2 diabetes mellitus with diabetic peripheral angiopathy without gangrene: Secondary | ICD-10-CM | POA: Diagnosis not present

## 2016-03-12 ENCOUNTER — Encounter: Payer: Self-pay | Admitting: Podiatry

## 2016-03-12 ENCOUNTER — Ambulatory Visit (INDEPENDENT_AMBULATORY_CARE_PROVIDER_SITE_OTHER): Payer: Commercial Managed Care - HMO | Admitting: Podiatry

## 2016-03-12 DIAGNOSIS — L84 Corns and callosities: Secondary | ICD-10-CM

## 2016-03-12 DIAGNOSIS — E1149 Type 2 diabetes mellitus with other diabetic neurological complication: Secondary | ICD-10-CM | POA: Diagnosis not present

## 2016-03-12 DIAGNOSIS — M79676 Pain in unspecified toe(s): Secondary | ICD-10-CM

## 2016-03-12 DIAGNOSIS — B351 Tinea unguium: Secondary | ICD-10-CM

## 2016-03-12 NOTE — Progress Notes (Signed)
Patient ID: [name], female   DOB: 22-Sep-1915, 82 y.o.   MRN: [id number] Complaint:  Visit Type: Patient returns to my office for continued preventative foot care services. Complaint: Patient states" my nails have grown long and thick and become painful to walk and wear shoes" Patient has been diagnosed with DM with neuropathy. The patient presents for preventative foot care services. No changes to ROS  Podiatric Exam: Vascular: dorsalis pedis is  palpable bilateral. Posterior tibial pulses are non palpable. Capillary return is immediate. Cold feet.  Sensorium: Diminished  Semmes Weinstein monofilament test. Normal tactile sensation bilaterally. Nail Exam: Pt has thick disfigured discolored nails with subungual debris noted bilateral entire nail hallux through fifth toenails Ulcer Exam: There is no evidence of ulcer or pre-ulcerative changes or infection. Orthopedic Exam: Muscle tone and strength are WNL. No limitations in general ROM. No crepitus or effusions noted. Foot type and digits show no abnormalities. HAV  B/L. Skin:  Porokeratosis dorsomedial exostosis 1st MPJ left foot.. No infection or ulcers  Diagnosis:  Onychomycosis, , Pain in right toe, pain in left toes,  Porokeratosis left foot  Treatment & Plan Procedures and Treatment: Consent by patient was obtained for treatment procedures. The patient understood the discussion of treatment and procedures well. All questions were answered thoroughly reviewed. Debridement of mycotic and hypertrophic toenails, 1 through 5 bilateral and clearing of subungual debris. No ulceration, no infection noted. [place] porokeratosis Return Visit-Office Procedure: Patient instructed to return to the office for a follow up visit 3 months for continued evaluation and treatment.    Gardiner Barefoot DPM

## 2016-03-18 ENCOUNTER — Other Ambulatory Visit: Payer: Self-pay

## 2016-03-18 NOTE — Patient Outreach (Signed)
[facility]) Care Management  [place]  03/18/2016   [name] 1915-04-12 [id number]  Subjective: Telephone call to patient for monthly call. Patient reports that she is doing good. She reports that she saw her primary care doctor recently and visit went well.  Patient does not remember her A1c but states it was a good number.  Patient reports her blood sugar today was 149.  Discussed with patient importance of continued diet control and managing blood sugar. She verbalized understanding.  No concerns.    Objective:   Current Medications:  Current Outpatient Prescriptions  Medication Sig Dispense Refill  . ACCU-CHEK AVIVA PLUS test strip CHECK BLOOD SUGAR THREE TIMES DAILY 300 each 1  . ACCU-CHEK FASTCLIX LANCETS MISC     . aspirin 81 MG tablet Take 81 mg by mouth daily.    . Calcium Carbonate-Vitamin D (CALTRATE 600+D) 600-400 MG-UNIT per tablet Take 1 tablet by mouth daily.    . ferrous sulfate 325 (65 FE) MG tablet Take 325 mg by mouth daily with breakfast.    . glimepiride (AMARYL) 2 MG tablet TAKE 1 TABLET BY MOUTH EVERY MORNING AND 1/2 TABLET AT DINNER 135 tablet 1  . hydrochlorothiazide (HYDRODIURIL) 25 MG tablet Take 0.5 tablets (12.5 mg total) by mouth daily. 30 tablet 0  . insulin aspart (NOVOLOG FLEXPEN) 100 UNIT/ML FlexPen If blood sugar is over 200, take 5 units and if they go over 300, take 6 units at that meal. (Patient taking differently: Inject 3-6 Units into the skin daily. 3 units before meals) 15 mL 2  . Insulin Pen Needle (NOVOFINE) 32G X 6 MM MISC Use as directed 50 each 3  . Lancets Misc. (ACCU-CHEK FASTCLIX LANCET) KIT USE AS DIRECTED FOR TESTING 1 kit 1  . LYRICA 75 MG capsule TAKE 1 CAPSULE TWICE DAILY 180 capsule 1  . Multiple Vitamins-Minerals (CENTRUM SILVER PO) Take 1 tablet by mouth.    . pantoprazole (PROTONIX) 40 MG tablet TAKE 1 TABLET BY MOUTH TWICE DAILY 180 tablet 0  . pravastatin (PRAVACHOL) 20 MG tablet Take 20 mg by  mouth daily.    Marland Kitchen SYNTHROID 88 MCG tablet TAKE 1 TABLET DAILY 90 tablet 3  . traMADol (ULTRAM) 50 MG tablet Take 1 tablet (50 mg total) by mouth 2 (two) times daily. (Patient taking differently: Take 50 mg by mouth every 12 (twelve) hours as needed for moderate pain. ) 60 tablet 3  . vitamin B-12 (CYANOCOBALAMIN) 1000 MCG tablet Take 1,000 mcg by mouth daily.     No current facility-administered medications for this visit.    Functional Status:  In your present state of health, do you have any difficulty performing the following activities: 01/24/2016 12/06/2015  Hearing? N Y  Vision? N N  Difficulty concentrating or making decisions? N N  Walking or climbing stairs? Y Y  Dressing or bathing? N Y  Doing errands, shopping? [name]  Preparing Food and eating ? N -  Using the Toilet? N -  In the past six months, have you accidently leaked urine? Y -  Do you have problems with loss of bowel control? N -  Managing your Medications? N -  Managing your Finances? Y -  Housekeeping or managing your Housekeeping? Y -    Fall/Depression Screening: PHQ 2/9 Scores 03/18/2016 02/20/2016 01/24/2016 04/06/2015  PHQ - 2 Score 0 0 0 0    Assessment: Patient continues to benefit from health coach outreach for disease management.  Plan:  [facility] CM Care Plan Problem One        Most Recent Value   Care Plan Problem One  Diabetes   Role Documenting the Problem One  Health Coach   Care Plan for Problem One  Active   [facility] Long Term Goal (31-90 days)  Patient will keep A1c less than 7 within 90 days   THN Long Term Goal Start Date  01/24/16   Interventions for Problem One [place] reinforced with patient importance of A1c.     [facility]N CM Short Term Goal #1 (0-30 days)  Patient will be able to name foods high in carbohydrates within 30 days.    [facility]N CM Short Term Goal #1 Start Date  03/18/16 [goal continued]   Interventions for Short Term Goal #1  RN Health Coach reinforced with  patient foods  high in carbohydrates.    THN CM Short Term Goal #2 (0-30 days)  Patient will be able to name signs and symptoms of hypoglycemia within 30 days.    THN CM Short Term Goal #2 Start Date  03/18/16 [goal continued]   Interventions for Short Term Goal #2  RN Health Coach reinforced with patient signs and symptoms of hypoglycemia.     THN CM Short Term Goal #3 (0-30 days)  Patient will be able to name 3 signs and symptoms of hyperglycemia within 30 days.   THN CM Short Term Goal #3 Start Date  03/18/16   Interventions for Short Tern Goal #3  [place] reinforced with patient signs and symptoms of hyperglycemia.      RN Health Coach will contact patient within one month and patient agrees to next outreach.    [name], RN, MSN [facility] [phone]

## 2016-04-05 DIAGNOSIS — D649 Anemia, unspecified: Secondary | ICD-10-CM | POA: Diagnosis not present

## 2016-04-05 DIAGNOSIS — E538 Deficiency of other specified B group vitamins: Secondary | ICD-10-CM | POA: Diagnosis not present

## 2016-04-05 DIAGNOSIS — R600 Localized edema: Secondary | ICD-10-CM | POA: Diagnosis not present

## 2016-04-05 DIAGNOSIS — I10 Essential (primary) hypertension: Secondary | ICD-10-CM | POA: Diagnosis not present

## 2016-04-05 DIAGNOSIS — N183 Chronic kidney disease, stage 3 (moderate): Secondary | ICD-10-CM | POA: Diagnosis not present

## 2016-04-05 DIAGNOSIS — R2681 Unsteadiness on feet: Secondary | ICD-10-CM | POA: Diagnosis not present

## 2016-04-05 DIAGNOSIS — R002 Palpitations: Secondary | ICD-10-CM | POA: Diagnosis not present

## 2016-04-05 DIAGNOSIS — E1151 Type 2 diabetes mellitus with diabetic peripheral angiopathy without gangrene: Secondary | ICD-10-CM | POA: Diagnosis not present

## 2016-04-05 DIAGNOSIS — I7389 Other specified peripheral vascular diseases: Secondary | ICD-10-CM | POA: Diagnosis not present

## 2016-04-10 ENCOUNTER — Other Ambulatory Visit: Payer: Self-pay

## 2016-04-10 NOTE — Patient Outreach (Signed)
[facility]) Care Management  [place]  04/10/2016   [name] 1915-11-24 [id number]  Subjective: Telephone call to patient for monthly call.  Patient reports that she is doing ok but has had some stressors recently with her house.  She reports that she saw her primary doctor last week and her blood pressure was up.  Patient reports she has not been taking her hydrochlorothiazide as it causes her to go to the bathroom frequently.  Patient reports she is checking her blood pressure and that the doctor wanted her to check it for three days and send him the readings.  She states she will do so but just recently got her machine. Patient reports that her pressure today was 271/91 but is going to have her aide recheck it later.  Discussed with patient the importance of taking medication as the doctor prescribed.  She verbalized understanding and will start back taking her hydrochlorothiazide as ordered and continue monitoring her blood pressure. Patient reports her most recent blood sugar was 139.  Discussed continuation of diabetic diet and signs/symptoms of hypo/hyperglycemia.  She verbalized understanding.   Objective:   Encounter Medications:  Outpatient Encounter Prescriptions as of 04/10/2016  Medication Sig Note  . ACCU-CHEK AVIVA PLUS test strip CHECK BLOOD SUGAR THREE TIMES DAILY   . ACCU-CHEK FASTCLIX LANCETS MISC  05/25/2015: .   . aspirin 81 MG tablet Take 81 mg by mouth daily.   . Calcium Carbonate-Vitamin D (CALTRATE 600+D) 600-400 MG-UNIT per tablet Take 1 tablet by mouth daily.   . ferrous sulfate 325 (65 FE) MG tablet Take 325 mg by mouth daily with breakfast.   . glimepiride (AMARYL) 2 MG tablet TAKE 1 TABLET BY MOUTH EVERY MORNING AND 1/2 TABLET AT DINNER   . hydrochlorothiazide (HYDRODIURIL) 25 MG tablet Take 0.5 tablets (12.5 mg total) by mouth daily.   . insulin aspart (NOVOLOG FLEXPEN) 100 UNIT/ML FlexPen If blood sugar is over 200, take 5 units  and if they go over 300, take 6 units at that meal. (Patient taking differently: Inject 3-6 Units into the skin daily. 3 units before meals) 12/11/2015: bs was 187 this morning.  . Insulin Pen Needle (NOVOFINE) 32G X 6 MM MISC Use as directed   . Lancets Misc. (ACCU-CHEK FASTCLIX LANCET) KIT USE AS DIRECTED FOR TESTING   . LYRICA 75 MG capsule TAKE 1 CAPSULE TWICE DAILY   . Multiple Vitamins-Minerals (CENTRUM SILVER PO) Take 1 tablet by mouth.   . pantoprazole (PROTONIX) 40 MG tablet TAKE 1 TABLET BY MOUTH TWICE DAILY   . pravastatin (PRAVACHOL) 20 MG tablet Take 20 mg by mouth daily.   Marland Kitchen SYNTHROID 88 MCG tablet TAKE 1 TABLET DAILY   . traMADol (ULTRAM) 50 MG tablet Take 1 tablet (50 mg total) by mouth 2 (two) times daily. (Patient taking differently: Take 50 mg by mouth every 12 (twelve) hours as needed for moderate pain. )   . vitamin B-12 (CYANOCOBALAMIN) 1000 MCG tablet Take 1,000 mcg by mouth daily.    No facility-administered encounter medications on file as of 04/10/2016.    Functional Status:  In your present state of health, do you have any difficulty performing the following activities: 01/24/2016 12/06/2015  Hearing? N Y  Vision? N N  Difficulty concentrating or making decisions? N N  Walking or climbing stairs? Y Y  Dressing or bathing? N Y  Doing errands, shopping? [name]  Preparing Food and eating ? N -  Using the  Toilet? N -  In the past six months, have you accidently leaked urine? Y -  Do you have problems with loss of bowel control? N -  Managing your Medications? N -  Managing your Finances? Y -  Housekeeping or managing your Housekeeping? Y -    Fall/Depression Screening: PHQ 2/9 Scores 03/18/2016 02/20/2016 01/24/2016 04/06/2015  PHQ - 2 Score 0 0 0 0    Assessment: Patient continues to benefit from health coach outreach for disease management and support.    Plan:  [facility] CM Care Plan Problem One        Most Recent Value   Care Plan Problem One  Diabetes   Role  Documenting the Problem One  Health Coach   Care Plan for Problem One  Active   THN Long Term Goal (31-90 days)  Patient will keep A1c less than 7 within 90 days   THN Long Term Goal Start Date  04/10/16 [goal continued]   Interventions for Problem One Long Term Goal  RN Health Coach reiterated with patient importance of A1c.     THN CM Short Term Goal #1 (0-30 days)  Patient will be able to name foods high in carbohydrates within 30 days.    [facility]N CM Short Term Goal #1 Start Date  04/10/16 [goal continued]   Interventions for Short Term Goal #1  RN Health Coach reiterated with  patient foods high in carbohydrates.    THN CM Short Term Goal #2 (0-30 days)  Patient will be able to name signs and symptoms of hypoglycemia within 30 days.    THN CM Short Term Goal #2 Start Date  04/10/16 [goal continued]   Interventions for Short Term Goal #2  RN Health Coach reiterated with patient signs and symptoms of hypoglycemia.     THN CM Short Term Goal #3 (0-30 days)  Patient will be able to name 3 signs and symptoms of hyperglycemia within 30 days.   THN CM Short Term Goal #3 Start Date  04/10/16 [goal continued]   Interventions for Short Tern Goal #3  RN Health Coach reiterated with patient signs and symptoms of hyperglycemia.      RN Health Coach will contact patient within one month and patient agrees to next outreach.     J , RN, MSN [facility] Care Management RN Telephonic Health Coach [phone]    

## 2016-04-10 NOTE — Patient Outreach (Signed)
[facility]) Care Management  04/10/2016  [name] 19-Mar-1915 [id number]  Telephone call to Dr. Shon Baton office.  Dr. Philip Aspen and staff out of the office. Message left with Dr. Danna Hefty assistant Amy to report increased blood pressure.    [name], RN, MSN [place] [phone]

## 2016-04-11 ENCOUNTER — Other Ambulatory Visit: Payer: Self-pay | Admitting: Endocrinology

## 2016-04-12 ENCOUNTER — Encounter (HOSPITAL_COMMUNITY): Payer: Self-pay

## 2016-04-12 ENCOUNTER — Emergency Department (HOSPITAL_COMMUNITY)
Admission: EM | Admit: 2016-04-12 | Discharge: 2016-04-12 | Disposition: A | Discharge status: Home / Self Care | Payer: Commercial Managed Care - HMO | Attending: Emergency Medicine | Admitting: Emergency Medicine

## 2016-04-12 DIAGNOSIS — Z7984 Long term (current) use of oral hypoglycemic drugs: Secondary | ICD-10-CM | POA: Insufficient documentation

## 2016-04-12 DIAGNOSIS — Z79899 Other long term (current) drug therapy: Secondary | ICD-10-CM | POA: Diagnosis not present

## 2016-04-12 DIAGNOSIS — Z794 Long term (current) use of insulin: Secondary | ICD-10-CM | POA: Insufficient documentation

## 2016-04-12 DIAGNOSIS — E079 Disorder of thyroid, unspecified: Secondary | ICD-10-CM | POA: Diagnosis not present

## 2016-04-12 DIAGNOSIS — I10 Essential (primary) hypertension: Secondary | ICD-10-CM | POA: Diagnosis not present

## 2016-04-12 DIAGNOSIS — E119 Type 2 diabetes mellitus without complications: Secondary | ICD-10-CM | POA: Insufficient documentation

## 2016-04-12 DIAGNOSIS — Z7982 Long term (current) use of aspirin: Secondary | ICD-10-CM | POA: Insufficient documentation

## 2016-04-12 NOTE — Discharge Instructions (Signed)
Hypertension Hypertension, commonly called high blood pressure, is when the force of blood pumping through your arteries is too strong. Your arteries are the blood vessels that carry blood from your heart throughout your body. A blood pressure reading consists of a higher number over a lower number, such as 110/72. The higher number (systolic) is the pressure inside your arteries when your heart pumps. The lower number (diastolic) is the pressure inside your arteries when your heart relaxes. Ideally you want your blood pressure below 120/80. Hypertension forces your heart to work harder to pump blood. Your arteries may become narrow or stiff. Having untreated or uncontrolled hypertension can cause heart attack, stroke, kidney disease, and other problems. RISK FACTORS Some risk factors for high blood pressure are controllable. Others are not.  Risk factors you cannot control include:   Race. You may be at higher risk if you are African American.  Age. Risk increases with age.  Gender. Men are at higher risk than women before age 45 years. After age 65, women are at higher risk than men. Risk factors you can control include:  Not getting enough exercise or physical activity.  Being overweight.  Getting too much fat, sugar, calories, or salt in your diet.  Drinking too much alcohol. SIGNS AND SYMPTOMS Hypertension does not usually cause signs or symptoms. Extremely high blood pressure (hypertensive crisis) may cause headache, anxiety, shortness of breath, and nosebleed. DIAGNOSIS To check if you have hypertension, your health care provider will measure your blood pressure while you are seated, with your arm held at the level of your heart. It should be measured at least twice using the same arm. Certain conditions can cause a difference in blood pressure between your right and left arms. A blood pressure reading that is higher than normal on one occasion does not mean that you need treatment. If  it is not clear whether you have high blood pressure, you may be asked to return on a different day to have your blood pressure checked again. Or, you may be asked to monitor your blood pressure at home for 1 or more weeks. TREATMENT Treating high blood pressure includes making lifestyle changes and possibly taking medicine. Living a healthy lifestyle can help lower high blood pressure. You may need to change some of your habits. Lifestyle changes may include:  Following the DASH diet. This diet is high in fruits, vegetables, and whole grains. It is low in salt, red meat, and added sugars.  Keep your sodium intake below 2,300 mg per day.  Getting at least 30-45 minutes of aerobic exercise at least 4 times per week.  Losing weight if necessary.  Not smoking.  Limiting alcoholic beverages.  Learning ways to reduce stress. Your health care provider may prescribe medicine if lifestyle changes are not enough to get your blood pressure under control, and if one of the following is true:  You are 18-59 years of age and your systolic blood pressure is above 140.  You are 60 years of age or older, and your systolic blood pressure is above 150.  Your diastolic blood pressure is above 90.  You have diabetes, and your systolic blood pressure is over 140 or your diastolic blood pressure is over 90.  You have kidney disease and your blood pressure is above 140/90.  You have heart disease and your blood pressure is above 140/90. Your personal target blood pressure may vary depending on your medical conditions, your age, and other factors. HOME CARE INSTRUCTIONS    Have your blood pressure rechecked as directed by your health care provider.   Take medicines only as directed by your health care provider. Follow the directions carefully. Blood pressure medicines must be taken as prescribed. The medicine does not work as well when you skip doses. Skipping doses also puts you at risk for  problems.  Do not smoke.   Monitor your blood pressure at home as directed by your health care provider. SEEK MEDICAL CARE IF:   You think you are having a reaction to medicines taken.  You have recurrent headaches or feel dizzy.  You have swelling in your ankles.  You have trouble with your vision. SEEK IMMEDIATE MEDICAL CARE IF:  You develop a severe headache or confusion.  You have unusual weakness, numbness, or feel faint.  You have severe chest or abdominal pain.  You vomit repeatedly.  You have trouble breathing. MAKE SURE YOU:   Understand these instructions.  Will watch your condition.  Will get help right away if you are not doing well or get worse.   This information is not intended to replace advice given to you by your health care provider. Make sure you discuss any questions you have with your health care provider.   Document Released: 12/16/2005 Document Revised: 05/02/2015 Document Reviewed: 10/08/2013 Elsevier Interactive Patient Education 2016 Elsevier Inc.  

## 2016-04-12 NOTE — ED Notes (Signed)
Pt. Here from home c/o headache and high blood pressure. Pt. Seen by PCP last week for the same with no medication prescribed. Pt. Hx of TIA recently. Pt. Aox4. Pt. Pressure 193/66.

## 2016-04-12 NOTE — ED Provider Notes (Signed)
CSN: [id number]     Arrival date & time 04/12/16  1111 History   First MD Initiated Contact with Patient 04/12/16 1128     Chief Complaint  Patient presents with  . Hypertension     (Consider location/radiation/quality/duration/timing/severity/associated sxs/prior Treatment) Patient is a 82 y.o. female presenting with hypertension. The history is provided by the patient.  Hypertension This is a chronic problem. The current episode started 12 to 24 hours ago. The problem occurs constantly. The problem has not changed since onset.Pertinent negatives include no chest pain, no abdominal pain, no headaches and no shortness of breath. Nothing aggravates the symptoms. Nothing relieves the symptoms. She has tried nothing for the symptoms.    Past Medical History  Diagnosis Date  . Diabetes mellitus without complication ([place])   . Hypertension   . Thyroid disease    Past Surgical History  Procedure Laterality Date  . Hip fracture surgery Bilateral   . Fracture surgery    . Tonsillectomy    . Cataract extraction w/ intraocular lens  implant, bilateral Bilateral   . Esophagogastroduodenoscopy N/A 03/03/2015    Procedure: ESOPHAGOGASTRODUODENOSCOPY (EGD);  Surgeon: Inda Castle, MD;  Location: [place];  Service: Endoscopy;  Laterality: N/A;  . Esophagogastroduodenoscopy N/A 03/07/2015    Procedure: ESOPHAGOGASTRODUODENOSCOPY (EGD);  Surgeon: Jerene Bears, MD;  Location: [facility] ENDOSCOPY;  Service: Endoscopy;  Laterality: N/A;   Family History  Problem Relation Age of Onset  . Diabetes Father    Social History  Substance Use Topics  . Smoking status: Never Smoker   . Smokeless tobacco: Never Used  . Alcohol Use: Yes     Comment: "I have had some alcohol in my younger days; a long time ago; when I was young"   OB History    No data available     Review of Systems  Constitutional:       Felt "jittery" this morning while eating breakfast  Respiratory: Negative for shortness of  breath.   Cardiovascular: Negative for chest pain.  Gastrointestinal: Negative for abdominal pain.  Neurological: Negative for headaches.  All other systems reviewed and are negative.     Allergies  Other  Home Medications   Prior to Admission medications   Medication Sig Start Date End Date Taking? Authorizing Provider  ACCU-CHEK AVIVA PLUS test strip CHECK BLOOD SUGAR THREE TIMES DAILY 07/12/15   Elayne Snare, MD  ACCU-CHEK FASTCLIX LANCETS [place]  11/23/14   Historical Provider, MD  aspirin 81 MG tablet Take 81 mg by mouth daily.    Historical Provider, MD  Calcium Carbonate-Vitamin D (CALTRATE 600+D) 600-400 MG-UNIT per tablet Take 1 tablet by mouth daily.    Historical Provider, MD  ferrous sulfate 325 (65 FE) MG tablet Take 325 mg by mouth daily with breakfast.    Historical Provider, MD  glimepiride (AMARYL) 2 MG tablet TAKE 1 TABLET BY MOUTH EVERY MORNING AND 1/2 TABLET AT [facility] 07/17/15   Elayne Snare, MD  hydrochlorothiazide (HYDRODIURIL) 25 MG tablet Take 0.5 tablets (12.5 mg total) by mouth daily. 12/11/15   Charlesetta Shanks, MD  insulin aspart (NOVOLOG FLEXPEN) 100 UNIT/ML FlexPen If blood sugar is over 200, take 5 units and if they go over 300, take 6 units at that meal. Patient taking differently: Inject 3-6 Units into the skin daily. 3 units before meals 02/16/15   Elayne Snare, MD  Insulin Pen Needle (NOVOFINE) 32G X 6 MM MISC Use as directed 11/21/14   Elayne Snare, MD  Lancets Misc. (  ACCU-CHEK FASTCLIX LANCET) KIT USE AS DIRECTED FOR TESTING 11/23/14   Elayne Snare, MD  LYRICA 75 MG capsule TAKE 1 CAPSULE TWICE DAILY 01/29/16   Elayne Snare, MD  Multiple Vitamins-Minerals (CENTRUM SILVER PO) Take 1 tablet by mouth.    Historical Provider, MD  pantoprazole (PROTONIX) 40 MG tablet TAKE 1 TABLET BY MOUTH TWICE DAILY 09/08/15   Elayne Snare, MD  pravastatin (PRAVACHOL) 20 MG tablet Take 20 mg by mouth daily. 06/25/15   Historical Provider, MD  SYNTHROID 88 MCG tablet TAKE 1 TABLET DAILY 08/18/15    Elayne Snare, MD  traMADol (ULTRAM) 50 MG tablet Take 1 tablet (50 mg total) by mouth 2 (two) times daily. Patient taking differently: Take 50 mg by mouth every 12 (twelve) hours as needed for moderate pain.  09/15/15   Elayne Snare, MD  vitamin B-12 (CYANOCOBALAMIN) 1000 MCG tablet Take 1,000 mcg by mouth daily.    Historical Provider, MD   BP 151/58 mmHg  Pulse 62  Temp(Src) 98.4 F (36.9 C) (Oral)  Resp 17  Ht 5' 4" (1.626 m)  Wt 132 lb (59.875 kg)  BMI 22.65 kg/m2  SpO2 97% Physical Exam  Constitutional: She is oriented to person, place, and time. She appears well-developed and well-nourished. No distress.  HENT:  Head: Normocephalic.  Eyes: Conjunctivae are normal.  Neck: Neck supple. No tracheal deviation present.  Cardiovascular: Normal rate, regular rhythm and normal heart sounds.   Pulmonary/Chest: Effort normal and breath sounds normal. No respiratory distress.  Abdominal: Soft. She exhibits no distension. There is no tenderness.  Neurological: She is alert and oriented to person, place, and time. No cranial nerve deficit. Coordination normal. GCS eye subscore is 4. GCS verbal subscore is 5. GCS motor subscore is 6.  Skin: Skin is warm and dry.  Psychiatric: She has a normal mood and affect.  Vitals reviewed.   ED Course  Procedures (including critical care time) Labs Review Labs Reviewed - No data to display  Imaging Review No results found. I have personally reviewed and evaluated these images and lab results as part of my medical decision-making.   EKG Interpretation   Date/Time:  Friday April 12 2016 11:30:30 EDT Ventricular Rate:  70 PR Interval:    QRS Duration: 145 QT Interval:  449 QTC Calculation: 484 R Axis:   -85 Text Interpretation:  Sinus rhythm prolonged PR interval Premature atrial  complexes RBBB and LAFB No significant change since last tracing Confirmed  by  MD,  ([id number]) on 04/12/2016 11:36:14 AM      MDM   Final diagnoses:   Essential hypertension    82 y.o. female presents with feeling "jittery" this morning. Her son told her to check her blood pressure which was checked by home health aide and was high. Is currently asymptomatic and remains slightly elevated to 244W systolic. Has known history of hypertension but not treated currently. I discussed not checking BP unless Pt is asymptomatic and long term risk associated with hypertension which is nearly negligible in a 82 year old vs risk of adverse drug effects. Plan to follow up with PCP as needed and return precautions discussed for worsening or new concerning symptoms.     Leo Grosser, MD 04/13/16 [phone]

## 2016-04-25 DIAGNOSIS — I10 Essential (primary) hypertension: Secondary | ICD-10-CM | POA: Diagnosis not present

## 2016-04-25 DIAGNOSIS — Z6824 Body mass index (BMI) 24.0-24.9, adult: Secondary | ICD-10-CM | POA: Diagnosis not present

## 2016-04-30 ENCOUNTER — Encounter (HOSPITAL_COMMUNITY): Payer: Self-pay | Admitting: General Practice

## 2016-04-30 ENCOUNTER — Emergency Department (HOSPITAL_COMMUNITY): Payer: Commercial Managed Care - HMO

## 2016-04-30 ENCOUNTER — Observation Stay (HOSPITAL_COMMUNITY)
Admission: EM | Admit: 2016-04-30 | Discharge: 2016-05-02 | Disposition: A | Discharge status: Home / Self Care | Payer: Commercial Managed Care - HMO | Attending: Emergency Medicine | Admitting: Emergency Medicine

## 2016-04-30 DIAGNOSIS — N183 Chronic kidney disease, stage 3 unspecified: Secondary | ICD-10-CM | POA: Diagnosis present

## 2016-04-30 DIAGNOSIS — I129 Hypertensive chronic kidney disease with stage 1 through stage 4 chronic kidney disease, or unspecified chronic kidney disease: Secondary | ICD-10-CM | POA: Insufficient documentation

## 2016-04-30 DIAGNOSIS — I160 Hypertensive urgency: Secondary | ICD-10-CM | POA: Insufficient documentation

## 2016-04-30 DIAGNOSIS — J441 Chronic obstructive pulmonary disease with (acute) exacerbation: Secondary | ICD-10-CM

## 2016-04-30 DIAGNOSIS — E039 Hypothyroidism, unspecified: Secondary | ICD-10-CM | POA: Insufficient documentation

## 2016-04-30 DIAGNOSIS — E43 Unspecified severe protein-calorie malnutrition: Secondary | ICD-10-CM | POA: Insufficient documentation

## 2016-04-30 DIAGNOSIS — R000 Tachycardia, unspecified: Secondary | ICD-10-CM | POA: Insufficient documentation

## 2016-04-30 DIAGNOSIS — Z7982 Long term (current) use of aspirin: Secondary | ICD-10-CM | POA: Diagnosis not present

## 2016-04-30 DIAGNOSIS — R011 Cardiac murmur, unspecified: Secondary | ICD-10-CM

## 2016-04-30 DIAGNOSIS — E038 Other specified hypothyroidism: Secondary | ICD-10-CM

## 2016-04-30 DIAGNOSIS — E119 Type 2 diabetes mellitus without complications: Secondary | ICD-10-CM | POA: Diagnosis not present

## 2016-04-30 DIAGNOSIS — Z79899 Other long term (current) drug therapy: Secondary | ICD-10-CM | POA: Diagnosis not present

## 2016-04-30 DIAGNOSIS — R51 Headache: Secondary | ICD-10-CM | POA: Diagnosis not present

## 2016-04-30 DIAGNOSIS — Z8701 Personal history of pneumonia (recurrent): Secondary | ICD-10-CM | POA: Insufficient documentation

## 2016-04-30 DIAGNOSIS — Z794 Long term (current) use of insulin: Secondary | ICD-10-CM | POA: Insufficient documentation

## 2016-04-30 DIAGNOSIS — I10 Essential (primary) hypertension: Secondary | ICD-10-CM | POA: Diagnosis not present

## 2016-04-30 DIAGNOSIS — E1349 Other specified diabetes mellitus with other diabetic neurological complication: Secondary | ICD-10-CM | POA: Diagnosis not present

## 2016-04-30 DIAGNOSIS — R0989 Other specified symptoms and signs involving the circulatory and respiratory systems: Secondary | ICD-10-CM

## 2016-04-30 DIAGNOSIS — R002 Palpitations: Secondary | ICD-10-CM | POA: Diagnosis not present

## 2016-04-30 DIAGNOSIS — Z8611 Personal history of tuberculosis: Secondary | ICD-10-CM | POA: Diagnosis not present

## 2016-04-30 DIAGNOSIS — E1140 Type 2 diabetes mellitus with diabetic neuropathy, unspecified: Secondary | ICD-10-CM | POA: Diagnosis present

## 2016-04-30 HISTORY — DX: Pneumonia, unspecified organism: J18.9

## 2016-04-30 HISTORY — DX: Personal history of other medical treatment: Z92.89

## 2016-04-30 HISTORY — DX: Chronic kidney disease, stage 3 (moderate): N18.3

## 2016-04-30 HISTORY — DX: Chronic kidney disease, stage 3 unspecified: N18.30

## 2016-04-30 LAB — DIFFERENTIAL
Basophils Absolute: 0 10*3/uL (ref 0.0–0.1)
Basophils Relative: 0 %
EOS PCT: 0 %
Eosinophils Absolute: 0 10*3/uL (ref 0.0–0.7)
LYMPHS ABS: 1.5 10*3/uL (ref 0.7–4.0)
LYMPHS PCT: 19 %
MONOS PCT: 8 %
Monocytes Absolute: 0.6 10*3/uL (ref 0.1–1.0)
NEUTROS PCT: 73 %
Neutro Abs: 6 10*3/uL (ref 1.7–7.7)

## 2016-04-30 LAB — CBC
HEMATOCRIT: 36 % (ref 36.0–46.0)
HEMOGLOBIN: 11.8 g/dL — AB (ref 12.0–15.0)
MCH: 32.5 pg (ref 26.0–34.0)
MCHC: 32.8 g/dL (ref 30.0–36.0)
MCV: 99.2 fL (ref 78.0–100.0)
Platelets: 126 10*3/uL — ABNORMAL LOW (ref 150–400)
RBC: 3.63 MIL/uL — ABNORMAL LOW (ref 3.87–5.11)
RDW: 13.3 % (ref 11.5–15.5)
WBC: 7.6 10*3/uL (ref 4.0–10.5)

## 2016-04-30 LAB — BASIC METABOLIC PANEL
ANION GAP: 10 (ref 5–15)
BUN: 30 mg/dL — AB (ref 6–20)
CHLORIDE: 102 mmol/L (ref 101–111)
CO2: 21 mmol/L — ABNORMAL LOW (ref 22–32)
Calcium: 8.7 mg/dL — ABNORMAL LOW (ref 8.9–10.3)
Creatinine, Ser: 1.51 mg/dL — ABNORMAL HIGH (ref 0.44–1.00)
GFR calc Af Amer: 32 mL/min — ABNORMAL LOW (ref >60)
GFR, EST NON AFRICAN AMERICAN: 28 mL/min — AB (ref >60)
GLUCOSE: 294 mg/dL — AB (ref 65–99)
POTASSIUM: 4.3 mmol/L (ref 3.5–5.1)
Sodium: 133 mmol/L — ABNORMAL LOW (ref 135–145)

## 2016-04-30 LAB — I-STAT TROPONIN, ED: TROPONIN I, POC: 0.02 ng/mL (ref 0.00–0.08)

## 2016-04-30 LAB — HEPATIC FUNCTION PANEL
ALT: 235 U/L — AB (ref 14–54)
AST: 124 U/L — ABNORMAL HIGH (ref 15–41)
Albumin: 2.8 g/dL — ABNORMAL LOW (ref 3.5–5.0)
Alkaline Phosphatase: 202 U/L — ABNORMAL HIGH (ref 38–126)
BILIRUBIN INDIRECT: 1.3 mg/dL — AB (ref 0.3–0.9)
Bilirubin, Direct: 2.5 mg/dL — ABNORMAL HIGH (ref 0.1–0.5)
TOTAL PROTEIN: 5.6 g/dL — AB (ref 6.5–8.1)
Total Bilirubin: 3.8 mg/dL — ABNORMAL HIGH (ref 0.3–1.2)

## 2016-04-30 LAB — TROPONIN I
TROPONIN I: 0.07 ng/mL — AB (ref <0.031)
Troponin I: 0.03 ng/mL (ref <0.031)

## 2016-04-30 LAB — TSH
TSH: 7.925 u[IU]/mL — ABNORMAL HIGH (ref 0.350–4.500)
TSH: 7.444 u[IU]/mL — ABNORMAL HIGH (ref 0.350–4.500)

## 2016-04-30 LAB — MAGNESIUM
MAGNESIUM: 2.1 mg/dL (ref 1.7–2.4)
Magnesium: 2.1 mg/dL (ref 1.7–2.4)

## 2016-04-30 LAB — GLUCOSE, CAPILLARY: GLUCOSE-CAPILLARY: 244 mg/dL — AB (ref 65–99)

## 2016-04-30 MED ORDER — HYDRALAZINE HCL 20 MG/ML IJ SOLN: 10.0000 mg | Freq: Once | INTRAMUSCULAR | Status: DC

## 2016-04-30 MED ORDER — ENOXAPARIN SODIUM 30 MG/0.3ML ~~LOC~~ SOLN
30.0000 mg | SUBCUTANEOUS | Status: DC
  Administered 2016-04-30 – 2016-05-01 (×2): 30 mg via SUBCUTANEOUS
  Filled 2016-04-30 (×2): qty 0.3

## 2016-04-30 MED ORDER — ONDANSETRON HCL 4 MG PO TABS: 4.0000 mg | ORAL_TABLET | Freq: Four times a day (QID) | ORAL | Status: DC | PRN

## 2016-04-30 MED ORDER — FERROUS SULFATE 325 (65 FE) MG PO TABS
325.0000 mg | ORAL_TABLET | Freq: Every day | ORAL | Status: DC
  Administered 2016-05-01 – 2016-05-02 (×2): 325 mg via ORAL
  Filled 2016-04-30 (×2): qty 1

## 2016-04-30 MED ORDER — HYDRALAZINE HCL 20 MG/ML IJ SOLN
10.0000 mg | INTRAMUSCULAR | Status: DC | PRN
  Administered 2016-04-30 – 2016-05-01 (×3): 10 mg via INTRAVENOUS
  Filled 2016-04-30 (×3): qty 1

## 2016-04-30 MED ORDER — LEVOTHYROXINE SODIUM 88 MCG PO TABS: 88.0000 ug | ORAL_TABLET | Freq: Every day | ORAL | Status: DC

## 2016-04-30 MED ORDER — SODIUM CHLORIDE 0.9% FLUSH
3.0000 mL | Freq: Two times a day (BID) | INTRAVENOUS | Status: DC
  Administered 2016-04-30 – 2016-05-01 (×2): 3 mL via INTRAVENOUS

## 2016-04-30 MED ORDER — NITROGLYCERIN IN D5W 200-5 MCG/ML-% IV SOLN
0.0000 ug/min | Freq: Once | INTRAVENOUS | Status: AC
  Administered 2016-04-30: 2.5 ug/min via INTRAVENOUS
  Filled 2016-04-30: qty 250

## 2016-04-30 MED ORDER — ACETAMINOPHEN 650 MG RE SUPP: 650.0000 mg | Freq: Four times a day (QID) | RECTAL | Status: DC | PRN

## 2016-04-30 MED ORDER — TRAMADOL HCL 50 MG PO TABS
50.0000 mg | ORAL_TABLET | Freq: Two times a day (BID) | ORAL | Status: DC
  Administered 2016-04-30 – 2016-05-02 (×4): 50 mg via ORAL
  Filled 2016-04-30 (×4): qty 1

## 2016-04-30 MED ORDER — HYDRALAZINE HCL 20 MG/ML IJ SOLN
10.0000 mg | Freq: Once | INTRAMUSCULAR | Status: AC
  Administered 2016-04-30: 10 mg via INTRAVENOUS

## 2016-04-30 MED ORDER — VITAMIN B-12 1000 MCG PO TABS
1000.0000 ug | ORAL_TABLET | Freq: Every day | ORAL | Status: DC
  Administered 2016-05-01 – 2016-05-02 (×2): 1000 ug via ORAL
  Filled 2016-04-30 (×2): qty 1

## 2016-04-30 MED ORDER — PREGABALIN 50 MG PO CAPS
75.0000 mg | ORAL_CAPSULE | Freq: Two times a day (BID) | ORAL | Status: DC
  Administered 2016-04-30 – 2016-05-02 (×4): 75 mg via ORAL
  Filled 2016-04-30 (×4): qty 1

## 2016-04-30 MED ORDER — ONDANSETRON HCL 4 MG/2ML IJ SOLN: 4.0000 mg | Freq: Four times a day (QID) | INTRAMUSCULAR | Status: DC | PRN

## 2016-04-30 MED ORDER — ACETAMINOPHEN 325 MG PO TABS: 650.0000 mg | ORAL_TABLET | Freq: Four times a day (QID) | ORAL | Status: DC | PRN

## 2016-04-30 MED ORDER — PANTOPRAZOLE SODIUM 40 MG PO TBEC
40.0000 mg | DELAYED_RELEASE_TABLET | Freq: Two times a day (BID) | ORAL | Status: DC
  Administered 2016-04-30 – 2016-05-02 (×4): 40 mg via ORAL
  Filled 2016-04-30 (×3): qty 1

## 2016-04-30 MED ORDER — ENOXAPARIN SODIUM 40 MG/0.4ML ~~LOC~~ SOLN: 40.0000 mg | SUBCUTANEOUS | Status: DC

## 2016-04-30 MED ORDER — ENSURE ENLIVE PO LIQD
237.0000 mL | Freq: Two times a day (BID) | ORAL | Status: DC
  Administered 2016-05-01 (×2): 237 mL via ORAL

## 2016-04-30 MED ORDER — ASPIRIN 81 MG PO CHEW
81.0000 mg | CHEWABLE_TABLET | Freq: Every day | ORAL | Status: DC
  Administered 2016-05-01 – 2016-05-02 (×2): 81 mg via ORAL
  Filled 2016-04-30 (×2): qty 1

## 2016-04-30 MED ORDER — SODIUM CHLORIDE 0.9 % IV BOLUS (SEPSIS)
500.0000 mL | Freq: Once | INTRAVENOUS | Status: AC
  Administered 2016-04-30: 500 mL via INTRAVENOUS

## 2016-04-30 MED ORDER — LEVALBUTEROL HCL 0.63 MG/3ML IN NEBU: 0.6300 mg | INHALATION_SOLUTION | Freq: Four times a day (QID) | RESPIRATORY_TRACT | Status: DC | PRN

## 2016-04-30 MED ORDER — PRAVASTATIN SODIUM 20 MG PO TABS
20.0000 mg | ORAL_TABLET | Freq: Every day | ORAL | Status: DC
  Administered 2016-05-01 – 2016-05-02 (×2): 20 mg via ORAL
  Filled 2016-04-30 (×2): qty 1

## 2016-04-30 MED ORDER — GLIMEPIRIDE 1 MG PO TABS
1.0000 mg | ORAL_TABLET | Freq: Every day | ORAL | Status: DC
  Administered 2016-05-01 – 2016-05-02 (×2): 1 mg via ORAL
  Filled 2016-04-30 (×2): qty 1

## 2016-04-30 MED ORDER — LEVOTHYROXINE SODIUM 88 MCG PO TABS
88.0000 ug | ORAL_TABLET | Freq: Every day | ORAL | Status: DC
  Administered 2016-05-01 – 2016-05-02 (×2): 88 ug via ORAL
  Filled 2016-04-30 (×2): qty 1

## 2016-04-30 MED ORDER — SODIUM CHLORIDE 0.9 % IV SOLN
INTRAVENOUS | Status: DC
Start: 2016-04-30 — End: 2016-05-02
  Administered 2016-04-30: 19:00:00 via INTRAVENOUS

## 2016-04-30 NOTE — ED Notes (Signed)
Report given to RN on 3W  

## 2016-04-30 NOTE — ED Notes (Signed)
Admitting MD at the bedside.  

## 2016-04-30 NOTE — ED Provider Notes (Signed)
CSN: [id number]     Arrival date & time 04/30/16  1038 History   First MD Initiated Contact with Patient 04/30/16 1430     Chief Complaint  Patient presents with  . Palpitations     (Consider location/radiation/quality/duration/timing/severity/associated sxs/prior Treatment) The history is provided by the patient and medical records.   82 yo F with PMHx of HTN, DM2, hypothyroidism who presents with symptomatic palpitations. Pt states that over the past one month, she has had progressively worsening blood pressure. She states that she normally runs SBP 120-130s but over the last week she has had increasing SBP to 160s, now >200. Over the past several days, she has had a feeling that she has a "heavy pulse" in her chest and she can feel every heart beat. Denies any chest pain. She does not feel that the heart beat is fast, but regular. No lightheadedness or dizziness. No pre-syncope or syncope. She occasionally feels these palpitations running from her chest into her left neck. No tinnitus.   Past Medical History  Diagnosis Date  . Hypertension   . Tuberculosis 1930s  . Type II diabetes mellitus ([place]) since 1964  . Pneumonia 1930s; ? 11/2015  . History of blood transfusion     "when my son was born; maybe once since" (04/30/2016)  . Chronic kidney disease (CKD), stage III (moderate)     [name] 12/06/2015  . Hypothyroidism    Past Surgical History  Procedure Laterality Date  . Hip fracture surgery Bilateral   . Fracture surgery    . Tonsillectomy    . Cataract extraction w/ intraocular lens  implant, bilateral Bilateral   . Esophagogastroduodenoscopy N/A 03/03/2015    Procedure: ESOPHAGOGASTRODUODENOSCOPY (EGD);  Surgeon: Inda Castle, MD;  Location: [facility];  Service: Endoscopy;  Laterality: N/A;  . Esophagogastroduodenoscopy N/A 03/07/2015    Procedure: ESOPHAGOGASTRODUODENOSCOPY (EGD);  Surgeon: Jerene Bears, MD;  Location: [facility] ENDOSCOPY;  Service: Endoscopy;  Laterality: N/A;    Family History  Problem Relation Age of Onset  . Diabetes Father    Social History  Substance Use Topics  . Smoking status: Never Smoker   . Smokeless tobacco: Never Used  . Alcohol Use: Yes     Comment: "I have had some alcohol in my younger days; a long time ago""   OB History    No data available     Review of Systems  Constitutional: Positive for fatigue. Negative for fever and chills.  HENT: Negative for congestion and rhinorrhea.   Eyes: Negative for visual disturbance.  Respiratory: Negative for cough, shortness of breath and wheezing.   Cardiovascular: Positive for palpitations. Negative for chest pain.  Gastrointestinal: Negative for nausea, vomiting, abdominal pain and diarrhea.  Genitourinary: Negative for dysuria and flank pain.  Musculoskeletal: Negative for neck pain and neck stiffness.  Skin: Negative for rash.  Neurological: Negative for syncope, weakness and headaches.      Allergies  Other  Home Medications   Prior to Admission medications   Medication Sig Start Date End Date Taking? Authorizing Provider  aspirin 81 MG tablet Take 81 mg by mouth daily.   Yes Historical Provider, MD  Calcium Carbonate-Vitamin D (CALTRATE 600+D) 600-400 MG-UNIT per tablet Take 1 tablet by mouth daily.   Yes Historical Provider, MD  ferrous sulfate 325 (65 FE) MG tablet Take 325 mg by mouth daily with breakfast.   Yes Historical Provider, MD  glimepiride (AMARYL) 2 MG tablet TAKE 1 TABLET BY MOUTH EVERY MORNING AND 1/2  TABLET AT [facility] 07/17/15  Yes Elayne Snare, MD  hydrochlorothiazide (HYDRODIURIL) 25 MG tablet Take 0.5 tablets (12.5 mg total) by mouth daily. 12/11/15  Yes Charlesetta Shanks, MD  insulin aspart (NOVOLOG FLEXPEN) 100 UNIT/ML FlexPen If blood sugar is over 200, take 5 units and if they go over 300, take 6 units at that meal. Patient taking differently: Inject 4 Units into the skin daily. 3 units before meals 02/16/15  Yes Elayne Snare, MD  LYRICA 75 MG capsule  TAKE 1 CAPSULE TWICE DAILY Patient taking differently: take 75mg  every evening 01/29/16  Yes Elayne Snare, MD  Multiple Vitamins-Minerals (CENTRUM SILVER PO) Take 1 tablet by mouth.   Yes Historical Provider, MD  pantoprazole (PROTONIX) 40 MG tablet TAKE 1 TABLET BY MOUTH TWICE DAILY 09/08/15  Yes Elayne Snare, MD  pravastatin (PRAVACHOL) 20 MG tablet Take 20 mg by mouth daily. 06/25/15  Yes Historical Provider, MD  SYNTHROID 88 MCG tablet TAKE 1 TABLET DAILY 08/18/15  Yes Elayne Snare, MD  traMADol (ULTRAM) 50 MG tablet Take 1 tablet (50 mg total) by mouth 2 (two) times daily. Patient taking differently: Take 50 mg by mouth every 12 (twelve) hours as needed for moderate pain.  09/15/15  Yes Elayne Snare, MD  vitamin B-12 (CYANOCOBALAMIN) 1000 MCG tablet Take 1,000 mcg by mouth daily.   Yes Historical Provider, MD   BP 140/45 mmHg  Pulse 66  Temp(Src) 98.5 F (36.9 C) (Oral)  Resp 18  Ht 5\' 5"  (1.651 m)  Wt 58.968 kg  BMI 21.63 kg/m2  SpO2 97% Physical Exam  Constitutional: She appears well-developed and well-nourished. No distress.  HENT:  Head: Normocephalic.  Mouth/Throat: No oropharyngeal exudate.  Eyes: Conjunctivae are normal. Pupils are equal, round, and reactive to light.  Neck: Normal range of motion. Neck supple.  Cardiovascular: Regular rhythm and intact distal pulses.  Tachycardia present.  Exam reveals no friction rub.   Murmur heard.  Systolic murmur is present with a grade of 2/6   Diastolic murmur is present with a grade of 1/6  Pulmonary/Chest: Effort normal. No respiratory distress. She has no wheezes. She has no rales.  Abdominal: Soft. She exhibits no distension. There is no tenderness.  Musculoskeletal: She exhibits no edema.  Neurological: She is alert.  Skin: Skin is warm. No rash noted.  Nursing note and vitals reviewed.   ED Course  Procedures (including critical care time) Labs Review Labs Reviewed  BASIC METABOLIC PANEL - Abnormal; Notable for the following:     Sodium 133 (*)    CO2 21 (*)    Glucose, Bld 294 (*)    BUN 30 (*)    Creatinine, Ser 1.51 (*)    Calcium 8.7 (*)    GFR calc non Af Amer 28 (*)    GFR calc Af Amer 32 (*)    All other components within normal limits  CBC - Abnormal; Notable for the following:    RBC 3.63 (*)    Hemoglobin 11.8 (*)    Platelets 126 (*)    All other components within normal limits  TSH - Abnormal; Notable for the following:    TSH 7.925 (*)    All other components within normal limits  TSH - Abnormal; Notable for the following:    TSH 7.444 (*)    All other components within normal limits  TROPONIN I - Abnormal; Notable for the following:    Troponin I 0.07 (*)    All other components within normal limits  HEPATIC FUNCTION PANEL - Abnormal; Notable for the following:    Total Protein 5.6 (*)    Albumin 2.8 (*)    AST 124 (*)    ALT 235 (*)    Alkaline Phosphatase 202 (*)    Total Bilirubin 3.8 (*)    Bilirubin, Direct 2.5 (*)    Indirect Bilirubin 1.3 (*)    All other components within normal limits  GLUCOSE, CAPILLARY - Abnormal; Notable for the following:    Glucose-Capillary 244 (*)    All other components within normal limits  MAGNESIUM  DIFFERENTIAL  MAGNESIUM  TROPONIN I  URINALYSIS, ROUTINE W REFLEX MICROSCOPIC (NOT AT [facility])  TROPONIN I  HEMOGLOBIN A1C  COMPREHENSIVE METABOLIC PANEL  CBC  I-STAT TROPOININ, ED  I-STAT TROPOININ, ED  Randolm Idol, ED    Imaging Review Dg Chest 2 View  04/30/2016  CLINICAL DATA:  Headache and high blood pressure EXAM: CHEST  2 VIEW COMPARISON:  12/06/2015 FINDINGS: There is mild elevation of the right hemidiaphragm. There is no focal parenchymal opacity. There is no pleural effusion or pneumothorax. The heart and mediastinal contours are unremarkable. The osseous structures are unremarkable. IMPRESSION: No active cardiopulmonary disease. Electronically Signed   By: Kathreen Devoid   On: 04/30/2016 12:19   I have personally reviewed and  evaluated these images and lab results as part of my medical decision-making.   EKG Interpretation   Date/Time:  Tuesday Apr 30 2016 11:01:24 EDT Ventricular Rate:  56 PR Interval:    QRS Duration: 136 QT Interval:  450 QTC Calculation: 434 R Axis:   -87 Text Interpretation:  likely sinus rhythm, but has baseline artifact Right  bundle branch block Left anterior fascicular block  Bifascicular block   Septal infarct , age undetermined Abnormal ECG Confirmed by Alvino Chapel   MD, Ovid Curd [phone]) on 04/30/2016 2:30:00 PM Also confirmed by Jeneen Rinks  MD,  [place] ([id number])  on 04/30/2016 3:24:37 PM      MDM  82 yo F with PMHx of HTN, T2DM, CKD who p/w symptomatic palpitations and hypertension. Pt hypertensive with wide pulse pressure on arrival, with sinus bradycardia on EKG. No acute ischemic changes. Suspicion is acute HTN urgency with palpitations 2/2 worsening versus exaggeration of underlying valvular disease due to HTN. Pt has h/o mild AR and moderate AI, and has murmurs c/w this on exam. Will plan to discuss with Cardiology given age, valvular disease, worsening HTN. Otherwise, will check screening labs, continue telemetry.  Labs/imaging as above. CBC with diff shows mild anemia. CMP with CKD. Mag normal. Trop negative. CXR clear. Discussed case with cardiology. Given bradycardia with prolonged PR on EKG and significant valvular disease, they advise against any nodal blocking agents at this time. Will start nitro gtt per Cardiology recommendations and will admit for tele, TTE, and monitoring. Pt in agreement with this plan. She remains HDS.  Clinical Impression: 1. Hypertensive urgency   2. Widened pulse pressure   3. Systolic murmur   4. Palpitations     Disposition: Admit  Condition: Stable  Pt seen in conjunction with Dr. Unice Bailey, MD 05/01/16 [place], MD 05/06/16 [phone]

## 2016-04-30 NOTE — H&P (Addendum)
[facility] History and Physical  [name] [id number] DOB: [birth year] DOA: 04/30/2016  Referring physician: *  PCP: Donnajean Lopes, MD   Chief Complaint: Palpitations   HPI:   82 year old female with history of hypertension, hypothyroidism, diabetes, first-degree AV block, right bundle branch block, left anterior vascular block who presents to the ER, brought in by her son, for palpitations that woke her up this morning. Patient states that she was having a bad dream about a television show, she woke up in the morning and could not go back to sleep. Palpitations continued throughout the morning. Patient's son  checked on  his mother at around 15 AM brought her to the ER for further evaluation. Patient denied any associated chest pain or shortness of breath. Patient recently had ambulatory  blood pressure monitoring,for  24 hours by her PCP. Patient also has a blood pressure monitor at home. Son states that patient's systolic blood pressure was around 150 and heart rate was around 55 this morning. In the ER the patient's blood sugar was 214. Patient was found to be alert and oriented. EKG showed right bundle blanch block, left anterior fascicular block. Initially blood pressure was 229. Patient was started on a nitro drip for hypertensive urgency. Patient chest pain-free      Review of Systems: negative for the following  Constitutional: Denies fever, chills, diaphoresis, appetite change and fatigue.  HEENT: Denies photophobia, eye pain, redness, hearing loss, ear pain, congestion, sore throat, rhinorrhea, sneezing, mouth sores, trouble swallowing, neck pain, neck stiffness and tinnitus.  Respiratory: Denies SOB, DOE, cough, chest tightness, and wheezing.  Cardiovascular: Denies chest pain, positive for palpitations and leg swelling.  Gastrointestinal: Denies nausea, vomiting, abdominal pain, diarrhea, constipation, blood in stool and abdominal distention.  Genitourinary:  Denies dysuria, urgency, frequency, hematuria, flank pain and difficulty urinating.  Musculoskeletal: Denies myalgias, back pain, joint swelling, arthralgias and gait problem.  Skin: Denies pallor, rash and wound.  Neurological: Denies dizziness, seizures, syncope, weakness, light-headedness, numbness and headaches.  Hematological: Denies adenopathy. Easy bruising, personal or family bleeding history  Psychiatric/Behavioral: Denies suicidal ideation, mood changes, confusion, nervousness, sleep disturbance and agitation       Past Medical History  Diagnosis Date  . Diabetes mellitus without complication ([place])   . Hypertension   . Thyroid disease      Past Surgical History  Procedure Laterality Date  . Hip fracture surgery Bilateral   . Fracture surgery    . Tonsillectomy    . Cataract extraction w/ intraocular lens  implant, bilateral Bilateral   . Esophagogastroduodenoscopy N/A 03/03/2015    Procedure: ESOPHAGOGASTRODUODENOSCOPY (EGD);  Surgeon: Inda Castle, MD;  Location: [place];  Service: Endoscopy;  Laterality: N/A;  . Esophagogastroduodenoscopy N/A 03/07/2015    Procedure: ESOPHAGOGASTRODUODENOSCOPY (EGD);  Surgeon: Jerene Bears, MD;  Location[facility] ENDOSCOPY;  Service: Endoscopy;  Laterality: N/A;      Social History:  reports that she has never smoked. She has never used smokeless tobacco. She reports that she drinks alcohol. She reports that she does not use illicit drugs.    Allergies  Allergen Reactions  . Other     Ground red pepper lotion to rub on her knee caused SOB    Family History  Problem Relation Age of Onset  . Diabetes Father         Prior to Admission medications   Medication Sig Start Date End Date Taking? Authorizing Provider  aspirin 81 MG tablet Take 81 mg by mouth  daily.   Yes Historical Provider, MD  Calcium Carbonate-Vitamin D (CALTRATE 600+D) 600-400 MG-UNIT per tablet Take 1 tablet by mouth daily.   Yes Historical Provider, MD   ferrous sulfate 325 (65 FE) MG tablet Take 325 mg by mouth daily with breakfast.   Yes Historical Provider, MD  glimepiride (AMARYL) 2 MG tablet TAKE 1 TABLET BY MOUTH EVERY MORNING AND 1/2 TABLET AT [facility] 07/17/15  Yes Elayne Snare, MD  hydrochlorothiazide (HYDRODIURIL) 25 MG tablet Take 0.5 tablets (12.5 mg total) by mouth daily. 12/11/15  Yes Charlesetta Shanks, MD  insulin aspart (NOVOLOG FLEXPEN) 100 UNIT/ML FlexPen If blood sugar is over 200, take 5 units and if they go over 300, take 6 units at that meal. Patient taking differently: Inject 4 Units into the skin daily. 3 units before meals 02/16/15  Yes Elayne Snare, MD  LYRICA 75 MG capsule TAKE 1 CAPSULE TWICE DAILY Patient taking differently: take 75mg  every evening 01/29/16  Yes Elayne Snare, MD  Multiple Vitamins-Minerals (CENTRUM SILVER PO) Take 1 tablet by mouth.   Yes Historical Provider, MD  pantoprazole (PROTONIX) 40 MG tablet TAKE 1 TABLET BY MOUTH TWICE DAILY 09/08/15  Yes Elayne Snare, MD  pravastatin (PRAVACHOL) 20 MG tablet Take 20 mg by mouth daily. 06/25/15  Yes Historical Provider, MD  SYNTHROID 88 MCG tablet TAKE 1 TABLET DAILY 08/18/15  Yes Elayne Snare, MD  traMADol (ULTRAM) 50 MG tablet Take 1 tablet (50 mg total) by mouth 2 (two) times daily. Patient taking differently: Take 50 mg by mouth every 12 (twelve) hours as needed for moderate pain.  09/15/15  Yes Elayne Snare, MD  vitamin B-12 (CYANOCOBALAMIN) 1000 MCG tablet Take 1,000 mcg by mouth daily.   Yes Historical Provider, MD     Physical Exam: Filed Vitals:   04/30/16 1600 04/30/16 1700 04/30/16 1739 04/30/16 1745  BP: 223/66 150/74 161/75   Pulse: 58 37 70 75  Temp:      TempSrc:      Resp: 19 23 24 23   Height:      Weight:      SpO2: 99% 98% 98% 99%      Constitutional: NAD, calm, comfortable Filed Vitals:   04/30/16 1600 04/30/16 1700 04/30/16 1739 04/30/16 1745  BP: 223/66 150/74 161/75   Pulse: 58 37 70 75  Temp:      TempSrc:      Resp: 19 23 24 23   Height:       Weight:      SpO2: 99% 98% 98% 99%   Eyes: PERRL, lids and conjunctivae normal ENMT: Mucous membranes are moist. Posterior pharynx clear of any exudate or lesions.Normal dentition.  Neck: normal, supple, no masses, no thyromegaly Respiratory: clear to auscultation bilaterally, no wheezing, no crackles. Normal respiratory effort. No accessory muscle use.  Cardiovascular: Regular rate and rhythm, no murmurs / rubs / gallops. No extremity edema. 2+ pedal pulses. No carotid bruits.  Abdomen: no tenderness, no masses palpated. No hepatosplenomegaly. Bowel sounds positive.  Musculoskeletal: no clubbing / cyanosis. No joint deformity upper and lower extremities. Good ROM, no contractures. Normal muscle tone.  Skin: no rashes, lesions, ulcers. No induration Neurologic: CN 2-12 grossly intact. Sensation intact, DTR normal. Strength 5/5 in all 4.  Psychiatric: Normal judgment and insight. Alert and oriented x 3. Normal mood.     Labs on Admission: I have personally reviewed following labs and imaging studies  CBC:  Recent Labs Lab 04/30/16 1103  WBC 7.6  NEUTROABS 6.0  HGB 11.8*  HCT 36.0  MCV 99.2  PLT 126*    Basic Metabolic Panel:  Recent Labs Lab 04/30/16 1103  NA 133*  K 4.3  CL 102  CO2 21*  GLUCOSE 294*  BUN 30*  CREATININE 1.51*  CALCIUM 8.7*  MG 2.1    GFR: Estimated Creatinine Clearance: 18.7 mL/min (by C-G formula based on Cr of 1.51).  Liver Function Tests: No results for input(s): AST, ALT, ALKPHOS, BILITOT, PROT, ALBUMIN in the last 168 hours. No results for input(s): LIPASE, AMYLASE in the last 168 hours. No results for input(s): AMMONIA in the last 168 hours.  Coagulation Profile: No results for input(s): INR, PROTIME in the last 168 hours. No results for input(s): DDIMER in the last 72 hours.  Cardiac Enzymes: No results for input(s): CKTOTAL, CKMB, CKMBINDEX, TROPONINI in the last 168 hours.  BNP (last 3 results) No results for input(s):  PROBNP in the last 8760 hours.  HbA1C: No results for input(s): HGBA1C in the last 72 hours. Lab Results  Component Value Date   HGBA1C 7.8* 06/08/2015   HGBA1C 8.4* 05/25/2015   HGBA1C 7.5* 04/10/2015     CBG: No results for input(s): GLUCAP in the last 168 hours.  Lipid Profile: No results for input(s): CHOL, HDL, LDLCALC, TRIG, CHOLHDL, LDLDIRECT in the last 72 hours.  Thyroid Function Tests:  Recent Labs  04/30/16 1530  TSH 7.925*    Anemia Panel: No results for input(s): VITAMINB12, FOLATE, FERRITIN, TIBC, IRON, RETICCTPCT in the last 72 hours.  Urine analysis:    Component Value Date/Time   COLORURINE YELLOW 02/19/2016 [place] 02/19/2016 0841   LABSPEC 1.015 02/19/2016 0841   PHURINE 7.5 02/19/2016 0841   GLUCOSEU 500* 02/19/2016 0841   GLUCOSEU NEGATIVE 12/12/2014 0929   HGBUR NEGATIVE 02/19/2016 0841   BILIRUBINUR NEGATIVE 02/19/2016 0841   KETONESUR NEGATIVE 02/19/2016 0841   PROTEINUR 100* 02/19/2016 0841   UROBILINOGEN 0.2 05/25/2015 1531   NITRITE NEGATIVE 02/19/2016 0841   LEUKOCYTESUR NEGATIVE 02/19/2016 0841    Sepsis Labs:   )No results found for this or any previous visit (from the past 240 hour(s)).       Radiological Exams on Admission: Dg Chest 2 View  04/30/2016  CLINICAL DATA:  Headache and high blood pressure EXAM: CHEST  2 VIEW COMPARISON:  12/06/2015 FINDINGS: There is mild elevation of the right hemidiaphragm. There is no focal parenchymal opacity. There is no pleural effusion or pneumothorax. The heart and mediastinal contours are unremarkable. The osseous structures are unremarkable. IMPRESSION: No active cardiopulmonary disease. Electronically Signed   By: Kathreen Devoid   On: 04/30/2016 12:19   Dg Chest 2 View  04/30/2016  CLINICAL DATA:  Headache and high blood pressure EXAM: CHEST  2 VIEW COMPARISON:  12/06/2015 FINDINGS: There is mild elevation of the right hemidiaphragm. There is no focal parenchymal opacity.  There is no pleural effusion or pneumothorax. The heart and mediastinal contours are unremarkable. The osseous structures are unremarkable. IMPRESSION: No active cardiopulmonary disease. Electronically Signed   By: Kathreen Devoid   On: 04/30/2016 12:19      EKG: Independently reviewed. Right bundle branch block, left anterior fascicular block  Assessment/Plan Principal Problem:   Hypertensive urgency Patient admitted for hypertensive urgency, tapered off nitroglycerin drip in the ED , started on prn hydralazine for SBP>160 Check TSH,  2-D echo, cycle cardiac enzymes Admitted to telemetry Continue hydrochlorothiazide  Right bundle branch block, left anterior vesicular block Has previously  seen Dr. Johnsie Cancel,  who recommended avoiding AV nodal blocking drugs Repeat 2-D echo Consult cardiology for further recommendations based on telemetry findings/2-D echo Patient may benefit from an event monitor for 30 day Holter monitor    Hypothyroidism-check TSH   Diabetes mellitus type 2 Will check hemoglobin A1c, continue Amaryl, start patient on sliding scale insulin Check lipid panel    Diabetic neuropathy (HCC)-continue Lyrica    Chronic kidney disease, stage III (moderate)-creatinine at baseline 1.5-1.6      DVT prophylaxis: Lovenox     Code Status Orders Full code       Family Communication: discussed with patient's Son By the bedside   Disposition Plan:  Anticipate discharge in one - 2 days pending further workup and clinical progress  Consults called: Cardiology -Dr Burt Knack called   Admission status:  Admission  Total time spent 55 minutes.Greater than 50% of this time was spent in counseling, explanation of diagnosis, planning of further management, and coordination of care  [facility] MD [facility] Pager [phone]  If 7PM-7AM, please contact night-coverage [email or web address] Password [facility]  04/30/2016, 6:21 PM

## 2016-04-30 NOTE — ED Provider Notes (Signed)
Pt seen and evaluated. Also discussed with Dr. Ellender Hose.  Patient 82 years old. Only recent/new diagnosis of hypertension. Echo a year ago showed minimal aortic stenosis. Is symptomatic in only that she is aware of her pulse feeling in her chest hearing in her ears for the last few days with markedly elevated blood pressures, up over [id number] systolic at home.  Renal function with cranium 1.5. Is not anemic. Normal troponin. Chest x-ray without effusion, failure, or cardiomegaly.    D/W Cardiology by Dr. Ellender Hose. Recemmend BP control and admit, consult, ECHO.  Will start NTG qtt, consult hospitalist re:  Disposition.    Tanna Furry, MD 04/30/16 1623

## 2016-04-30 NOTE — ED Notes (Signed)
Patient here with palpitations since early am, patient denies CP but also complains that her BS is 214, alert and oriented on arrival.

## 2016-04-30 NOTE — ED Notes (Signed)
Nitro stopped per verbal order admitting MD

## 2016-05-01 DIAGNOSIS — R000 Tachycardia, unspecified: Secondary | ICD-10-CM | POA: Diagnosis not present

## 2016-05-01 DIAGNOSIS — I160 Hypertensive urgency: Secondary | ICD-10-CM

## 2016-05-01 DIAGNOSIS — R002 Palpitations: Secondary | ICD-10-CM | POA: Diagnosis not present

## 2016-05-01 DIAGNOSIS — E43 Unspecified severe protein-calorie malnutrition: Secondary | ICD-10-CM | POA: Insufficient documentation

## 2016-05-01 DIAGNOSIS — N183 Chronic kidney disease, stage 3 (moderate): Secondary | ICD-10-CM

## 2016-05-01 DIAGNOSIS — I453 Trifascicular block: Secondary | ICD-10-CM | POA: Diagnosis not present

## 2016-05-01 DIAGNOSIS — R011 Cardiac murmur, unspecified: Secondary | ICD-10-CM | POA: Diagnosis not present

## 2016-05-01 LAB — COMPREHENSIVE METABOLIC PANEL
ALBUMIN: 2.6 g/dL — AB (ref 3.5–5.0)
ALK PHOS: 209 U/L — AB (ref 38–126)
ALT: 200 U/L — AB (ref 14–54)
AST: 104 U/L — AB (ref 15–41)
Anion gap: 9 (ref 5–15)
BILIRUBIN TOTAL: 3.2 mg/dL — AB (ref 0.3–1.2)
BUN: 21 mg/dL — AB (ref 6–20)
CALCIUM: 8.1 mg/dL — AB (ref 8.9–10.3)
CO2: 21 mmol/L — ABNORMAL LOW (ref 22–32)
CREATININE: 1.31 mg/dL — AB (ref 0.44–1.00)
Chloride: 108 mmol/L (ref 101–111)
GFR calc Af Amer: 38 mL/min — ABNORMAL LOW (ref >60)
GFR, EST NON AFRICAN AMERICAN: 33 mL/min — AB (ref >60)
GLUCOSE: 207 mg/dL — AB (ref 65–99)
Potassium: 3.8 mmol/L (ref 3.5–5.1)
Sodium: 138 mmol/L (ref 135–145)
TOTAL PROTEIN: 5 g/dL — AB (ref 6.5–8.1)

## 2016-05-01 LAB — CBC
HEMATOCRIT: 34.6 % — AB (ref 36.0–46.0)
Hemoglobin: 11.3 g/dL — ABNORMAL LOW (ref 12.0–15.0)
MCH: 31.4 pg (ref 26.0–34.0)
MCHC: 32.7 g/dL (ref 30.0–36.0)
MCV: 96.1 fL (ref 78.0–100.0)
PLATELETS: 135 10*3/uL — AB (ref 150–400)
RBC: 3.6 MIL/uL — ABNORMAL LOW (ref 3.87–5.11)
RDW: 13.4 % (ref 11.5–15.5)
WBC: 6.9 10*3/uL (ref 4.0–10.5)

## 2016-05-01 LAB — URINE MICROSCOPIC-ADD ON: RBC / HPF: NONE SEEN RBC/hpf (ref 0–5)

## 2016-05-01 LAB — GLUCOSE, CAPILLARY
GLUCOSE-CAPILLARY: 170 mg/dL — AB (ref 65–99)
GLUCOSE-CAPILLARY: 127 mg/dL — AB (ref 65–99)
Glucose-Capillary: 238 mg/dL — ABNORMAL HIGH (ref 65–99)
Glucose-Capillary: 252 mg/dL — ABNORMAL HIGH (ref 65–99)

## 2016-05-01 LAB — URINALYSIS, ROUTINE W REFLEX MICROSCOPIC
GLUCOSE, UA: 100 mg/dL — AB
Hgb urine dipstick: NEGATIVE
KETONES UR: 15 mg/dL — AB
NITRITE: NEGATIVE
PROTEIN: 30 mg/dL — AB
Specific Gravity, Urine: 1.016 (ref 1.005–1.030)
pH: 6 (ref 5.0–8.0)

## 2016-05-01 LAB — TROPONIN I: Troponin I: 0.03 ng/mL (ref <0.031)

## 2016-05-01 LAB — HEMOGLOBIN A1C
Hgb A1c MFr Bld: 9.2 % — ABNORMAL HIGH (ref 4.8–5.6)
Mean Plasma Glucose: 217 mg/dL

## 2016-05-01 MED ORDER — AMLODIPINE BESYLATE 5 MG PO TABS
5.0000 mg | ORAL_TABLET | Freq: Every day | ORAL | Status: DC
  Administered 2016-05-01 – 2016-05-02 (×2): 5 mg via ORAL
  Filled 2016-05-01 (×2): qty 1

## 2016-05-01 MED ORDER — INSULIN ASPART 100 UNIT/ML ~~LOC~~ SOLN
0.0000 [IU] | Freq: Three times a day (TID) | SUBCUTANEOUS | Status: DC
  Administered 2016-05-01: 8 [IU] via SUBCUTANEOUS
  Administered 2016-05-02 (×2): 3 [IU] via SUBCUTANEOUS

## 2016-05-01 MED ORDER — HYDROCHLOROTHIAZIDE 12.5 MG PO CAPS
12.5000 mg | ORAL_CAPSULE | Freq: Every day | ORAL | Status: DC
  Administered 2016-05-01 – 2016-05-02 (×2): 12.5 mg via ORAL
  Filled 2016-05-01 (×2): qty 1

## 2016-05-01 NOTE — Progress Notes (Signed)
Chaplain presented to the patient room to provide spiritual care support to a sweet 89 year new patient. She dialogued she is concerned about her recent health concerns and how it may affect her ability to continue her independent living status, she lived alone prior to her hospital admission. She has care takers that care for her by day, but she is alone at night and realizes that may need to change for the good ow her wellbeing.  She states has a son and will discuss with him her needs moving forward to discharge time. Chaplain offered words of encouragement for the patient, and will continue to follow for support. Chaplain Yaakov Guthrie [phone]

## 2016-05-01 NOTE — Progress Notes (Signed)
Patient had a 2.2 second pause and now looks like she's in 2nd Degree Heart Block Mobitz 1 with RBBB and rate in the mid 60's. B/P now is 140/45 since her last dose of Hydralazine 10 mg, will continue to monitor, no other changes at this time, no c/o pain or discomfort, sent a text page to [facility] and updated on VS and arrythmia changes and her pauses.

## 2016-05-01 NOTE — Care Management Obs Status (Signed)
[place] NOTIFICATION   Patient Details  Name: [name] MRN: [id number] Date of Birth: 1915/09/23   Medicare Observation Status Notification Given:  Yes (hypertensive urgency)    Bethena Roys, RN 05/01/2016, 11:18 AM

## 2016-05-01 NOTE — Care Management Note (Signed)
Case Management Note  Patient Details  Name: KALEEYAH STAVISH MRN: [id number] Date of Birth: 1915-07-15  Subjective/Objective:  Pt in for Hypertensive Urgency. Pt is from home alone with the support of her son and grandson that check in on her. Pt has Caregivers that come in the home daily from 9:00 am to 11:00 am and 4:00 pm-6:00 pm.                    Action/Plan: CM will continue to monitor for disposition needs.    Expected Discharge Date:                  Expected Discharge Plan:  [place]  In-House Referral:  NA  Discharge planning Services  CM Consult  Post Acute Care Choice:    Choice offered to:     DME Arranged:    DME Agency:     HH Arranged:    HH Agency:     Status of Service:  In process, will continue to follow  Medicare Important Message Given:    Date Medicare IM Given:    Medicare IM give by:    Date Additional Medicare IM Given:    Additional Medicare Important Message give by:     If discussed at [place] of Stay Meetings, dates discussed:    Additional Comments:  Bethena Roys, RN 05/01/2016, 11:37 AM

## 2016-05-01 NOTE — Progress Notes (Signed)
Initial Nutrition Assessment  DOCUMENTATION CODES:   Severe malnutrition in context of chronic illness  INTERVENTION:  -Ensure Enlive BID. Each supplement provides 350 kcals and 20 grams of protein. -Continue to monitor nutritional needs.   NUTRITION DIAGNOSIS:   Increased nutrient needs related to chronic illness as evidenced by estimated needs.  GOAL:   Patient will meet greater than or equal to 90% of their needs  MONITOR:   PO intake, Supplement acceptance, Labs, Weight trends, Skin, I & O's  REASON FOR ASSESSMENT:   Malnutrition Screening Tool    ASSESSMENT:   82 year old female with history of hypertension, hypothyroidism, diabetes, first-degree AV block, right bundle branch block, left anterior vascular block who presents to the ER, brought in by her son, for palpitations that woke her up this morning. Patient states that she was having a bad dream about a television show, she woke up in the morning and could not go back to sleep. Palpitations continued throughout the morning. Patient's son checked on his mother at around 50 AM brought her to the ER for further evaluation. Patient denied any associated chest pain or shortness of breath. Patient recently had ambulatory blood pressure monitoring,for 24 hours by her PCP. Patient also has a blood pressure monitor at home. Son states that patient's systolic blood pressure was around 150 and heart rate was around 55 this morning. In the ER the patient's blood sugar was 214. Patient was found to be alert and oriented. EKG showed right bundle blanch block, left anterior fascicular block. Initially blood pressure was 229. Patient was started on a nitro drip for hypertensive urgency. Patient chest pain-free  Pt has experienced 5.7% weight loss in past 2.5 months which is not significant for time frame. Pt reports eating TID.  Pt reports poor appetite but no decrease in intake over the past 6 months.  Pt states "I make myself eat  even if I don't want to". Pt does not take nutritional supplements at home. Discussed importance of eating and maintaining weight at advanced age. Pt states she is amenable to drinking Ensure if intake decreases. Current PO's 75%. Will continue Ensure BID.   NFPE: severe muscle depletion, severe fat depletion, mild edema. Suspect depletion is mostly d/t advanced age.   Labs reviewed; CBGs 170-244, BUN 21, creat 1.31, Ca 8.1, GFR 33.  Meds reviewed; ferrous sulfate 325 mg, B12 1000 mcg.  Diet Order:  Diet heart healthy/carb modified Room service appropriate?: Yes; Fluid consistency:: Thin  Skin:  Reviewed, no issues  Last BM:  5/2  Height:   Ht Readings from Last 1 Encounters:  04/30/16 5\' 5"  (1.651 m)    Weight:   Wt Readings from Last 1 Encounters:  05/01/16 132 lb (59.875 kg)    Ideal Body Weight:  56.8 kg  BMI:  Body mass index is 21.97 kg/(m^2).  Estimated Nutritional Needs:   Kcal:  1200-1400 kcals  Protein:  85-95 g  Fluid:  1.5 L  EDUCATION NEEDS:   No education needs identified at this time  Geoffery Lyons, [place] Dietetic Intern Pager [phone]

## 2016-05-01 NOTE — Progress Notes (Signed)
Patient ID: [name], female   DOB: Sep 26, 1915, 82 y.o.   MRN: [id number]    PROGRESS NOTE    [name]  [id number] DOB: 30-May-1915 DOA: 04/30/2016  PCP: Donnajean Lopes, MD   Outpatient Specialists:   Brief Narrative:  82 year old woman presented with hypertensive urgency.   Assessment & Plan:   Principal Problem:   Hypertensive urgency - currently on Norvasc and HCTZ - BP better - conservative management for now  - appreciate cardiology team following   Active Problems:   Hypothyroidism - continue synthroid     Diabetes with neuropathy ([place]) - on SSI for now - once ready for d/c, can resume home medical regimen     Chronic kidney disease, stage III (moderate) - Cr overall stable - BMP in AM    Protein-calorie malnutrition, severe - appreciate nutritionist recommendations     Thrombocytopenia - mild, no signs of bleeding - CBC in AM    Transaminitis - ALT > AST - unclear etiology - repeat in AM   DVT prophylaxis: Lovenox SQ Code Status: Full  Family Communication: Patient at bedside  Disposition Plan: Home in 1-2 days   Consultants:   Cardiology   Procedures:   None  Antimicrobials:   None   Subjective: Reports feeling better.   Objective: Filed Vitals:   05/01/16 0400 05/01/16 0735 05/01/16 1142 05/01/16 1525  BP: 151/45 165/57 199/52 178/60  Pulse: 74 66 52 53  Temp: 99.6 F (37.6 C) 98.7 F (37.1 C) 98.5 F (36.9 C)   TempSrc: Oral Oral Oral   Resp: 18 16 16    Height:      Weight: 59.875 kg (132 lb)     SpO2: 98% 95% 98%     Intake/Output Summary (Last 24 hours) at 05/01/16 1901 Last data filed at 05/01/16 1700  Gross per 24 hour  Intake   1600 ml  Output   1200 ml  Net    400 ml   Filed Weights   04/30/16 1055 05/01/16 0400  Weight: 58.968 kg (130 lb) 59.875 kg (132 lb)    Examination:  General exam: Appears calm and comfortable  Respiratory system: Clear to auscultation. Respiratory effort  normal. Cardiovascular system: S1 & S2 heard, Bradycardic, No JVD, rubs, gallops or clicks. No pedal edema. Gastrointestinal system: Abdomen is nondistended, soft and nontender.  Data Reviewed: I have personally reviewed following labs and imaging studies  CBC:  Recent Labs Lab 04/30/16 1103 05/01/16 0422  WBC 7.6 6.9  NEUTROABS 6.0  --   HGB 11.8* 11.3*  HCT 36.0 34.6*  MCV 99.2 96.1  PLT 126* [id number]*   Basic Metabolic Panel:  Recent Labs Lab 04/30/16 1103 04/30/16 1842 05/01/16 0422  NA 133*  --  138  K 4.3  --  3.8  CL 102  --  108  CO2 21*  --  21*  GLUCOSE 294*  --  207*  BUN 30*  --  21*  CREATININE 1.51*  --  1.31*  CALCIUM 8.7*  --  8.1*  MG 2.1 2.1  --    Liver Function Tests:  Recent Labs Lab 04/30/16 1842 05/01/16 0422  AST 124* 104*  ALT 235* 200*  ALKPHOS 202* 209*  BILITOT 3.8* 3.2*  PROT 5.6* 5.0*  ALBUMIN 2.8* 2.6*   Cardiac Enzymes:  Recent Labs Lab 04/30/16 1842 04/30/16 2242 05/01/16 0427  TROPONINI 0.03 0.07* 0.03   HbA1C:  Recent Labs  04/30/16 1842  HGBA1C 9.2*   CBG:  Recent Labs Lab 04/30/16 2140 05/01/16 0738 05/01/16 1140 05/01/16 1719  GLUCAP 244* 170* 238* 252*   Thyroid Function Tests:  Recent Labs  04/30/16 1842  TSH 7.444*   Urine analysis:    Component Value Date/Time   COLORURINE AMBER* 05/01/2016 0519   APPEARANCEUR CLEAR 05/01/2016 0519   LABSPEC 1.016 05/01/2016 0519   PHURINE 6.0 05/01/2016 0519   GLUCOSEU 100* 05/01/2016 0519   GLUCOSEU NEGATIVE 12/12/2014 0929   HGBUR NEGATIVE 05/01/2016 0519   BILIRUBINUR SMALL* 05/01/2016 0519   KETONESUR 15* 05/01/2016 0519   PROTEINUR 30* 05/01/2016 0519   UROBILINOGEN 0.2 05/25/2015 1531   NITRITE NEGATIVE 05/01/2016 0519   LEUKOCYTESUR MODERATE* 05/01/2016 0519    Radiology Studies: Dg Chest 2 View  04/30/2016  CLINICAL DATA:  Headache and high blood pressure EXAM: CHEST  2 VIEW COMPARISON:  12/06/2015 FINDINGS: There is mild elevation of the  right hemidiaphragm. There is no focal parenchymal opacity. There is no pleural effusion or pneumothorax. The heart and mediastinal contours are unremarkable. The osseous structures are unremarkable. IMPRESSION: No active cardiopulmonary disease. Electronically Signed   By: Kathreen Devoid   On: 04/30/2016 12:19    Scheduled Meds: . amLODipine  5 mg Oral Daily  . aspirin  81 mg Oral Daily  . enoxaparin (LOVENOX) injection  30 mg Subcutaneous Q24H  . feeding supplement (ENSURE ENLIVE)  237 mL Oral BID BM  . ferrous sulfate  325 mg Oral Q breakfast  . glimepiride  1 mg Oral Q breakfast  . hydrochlorothiazide  12.5 mg Oral Daily  . insulin aspart  0-15 Units Subcutaneous TID WC  . levothyroxine  88 mcg Oral QAC breakfast  . pantoprazole  40 mg Oral BID  . pravastatin  20 mg Oral Daily  . pregabalin  75 mg Oral BID  . sodium chloride flush  3 mL Intravenous Q12H  . traMADol  50 mg Oral BID  . vitamin B-12  1,000 mcg Oral Daily   Continuous Infusions: . sodium chloride 50 mL/hr at 04/30/16 1900     LOS: 1 day   Time spent: 20 minutes   Faye Ramsay, MD [facility] Pager [phone]  If 7PM-7AM, please contact night-coverage [email or web address] Password [email or web address] 05/01/2016, 7:01 PM

## 2016-05-01 NOTE — Progress Notes (Signed)
CARDIOLOGY CONSULT NOTE  Patient ID: [name], MRN: [id number], DOB/AGE: August 27, 1915 82 y.o. Admit date: 04/30/2016 Date of Consult: 05/01/2016  Primary Physician: Donnajean Lopes, MD Primary Cardiologist: Johnsie Cancel Referring Physician: Allyson Sabal  Chief Complaint: Palpitations Reason for Consultation: palpitations, conduction disease  HPI: This is a 82 year old woman who presented yesterday with hypertensive urgency. We are asked to see her to evaluate heart palpitations in the setting of significant conduction system disease. The patient has a history of trifascicular block. She has no history of syncope or presyncope. In fact she denies any recent lightheadedness. She admits to memory problems and doesn't recall the exact reason she came to the emergency room yesterday. She has experienced heart palpitations but denies chest pain or shortness of breath. She remains independent but has someone come into her house 2 hours, twice per day to help her. She no longer drives a car. She has seen Dr. Johnsie Cancel in the office and her last echocardiogram was about one year ago demonstrating normal LV function with mild valvular heart disease.  Medical History:  Past Medical History  Diagnosis Date  . Hypertension   . Tuberculosis 1930s  . Type II diabetes mellitus ([place]) since 1964  . Pneumonia 1930s; ? 11/2015  . History of blood transfusion     "when my son was born; maybe once since" (04/30/2016)  . Chronic kidney disease (CKD), stage III (moderate)     [name] 12/06/2015  . Hypothyroidism       Surgical History:  Past Surgical History  Procedure Laterality Date  . Hip fracture surgery Bilateral   . Fracture surgery    . Tonsillectomy    . Cataract extraction w/ intraocular lens  implant, bilateral Bilateral   . Esophagogastroduodenoscopy N/A 03/03/2015    Procedure: ESOPHAGOGASTRODUODENOSCOPY (EGD);  Surgeon: Inda Castle, MD;  Location: [place];  Service: Endoscopy;  Laterality: N/A;    . Esophagogastroduodenoscopy N/A 03/07/2015    Procedure: ESOPHAGOGASTRODUODENOSCOPY (EGD);  Surgeon: Jerene Bears, MD;  [facility] END[facility]COPY;  Service: Endoscopy;  Laterality: N/A;     Home Meds: Prior to Admission medications   Medication Sig Start Date End Date Taking? Authorizing Provider  aspirin 81 MG tablet Take 81 mg by mouth daily.   Yes Historical Provider, MD  Calcium Carbonate-Vitamin D (CALTRATE 600+D) 600-400 MG-UNIT per tablet Take 1 tablet by mouth daily.   Yes Historical Provider, MD  ferrous sulfate 325 (65 FE) MG tablet Take 325 mg by mouth daily with breakfast.   Yes Historical Provider, MD  glimepiride (AMARYL) 2 MG tablet TAKE 1 TABLET BY MOUTH EVERY MORNING AND 1/2 TABLET AT [facility] 07/17/15  Yes Elayne Snare, MD  hydrochlorothiazide (HYDRODIURIL) 25 MG tablet Take 0.5 tablets (12.5 mg total) by mouth daily. 12/11/15  Yes Charlesetta Shanks, MD  insulin aspart (NOVOLOG FLEXPEN) 100 UNIT/ML FlexPen If blood sugar is over 200, take 5 units and if they go over 300, take 6 units at that meal. Patient taking differently: Inject 4 Units into the skin daily. 3 units before meals 02/16/15  Yes Elayne Snare, MD  LYRICA 75 MG capsule TAKE 1 CAPSULE TWICE DAILY Patient taking differently: take 75mg  every evening 01/29/16  Yes Elayne Snare, MD  Multiple Vitamins-Minerals (CENTRUM SILVER PO) Take 1 tablet by mouth.   Yes Historical Provider, MD  pantoprazole (PROTONIX) 40 MG tablet TAKE 1 TABLET BY MOUTH TWICE DAILY 09/08/15  Yes Elayne Snare, MD  pravastatin (PRAVACHOL) 20 MG tablet Take 20 mg by mouth daily.  06/25/15  Yes Historical Provider, MD  SYNTHROID 88 MCG tablet TAKE 1 TABLET DAILY 08/18/15  Yes Elayne Snare, MD  traMADol (ULTRAM) 50 MG tablet Take 1 tablet (50 mg total) by mouth 2 (two) times daily. Patient taking differently: Take 50 mg by mouth every 12 (twelve) hours as needed for moderate pain.  09/15/15  Yes Elayne Snare, MD  vitamin B-12 (CYANOCOBALAMIN) 1000 MCG tablet Take 1,000 mcg by  mouth daily.   Yes Historical Provider, MD    Inpatient Medications:  . aspirin  81 mg Oral Daily  . enoxaparin (LOVENOX) injection  30 mg Subcutaneous Q24H  . feeding supplement (ENSURE ENLIVE)  237 mL Oral BID BM  . ferrous sulfate  325 mg Oral Q breakfast  . glimepiride  1 mg Oral Q breakfast  . levothyroxine  88 mcg Oral QAC breakfast  . pantoprazole  40 mg Oral BID  . pravastatin  20 mg Oral Daily  . pregabalin  75 mg Oral BID  . sodium chloride flush  3 mL Intravenous Q12H  . traMADol  50 mg Oral BID  . vitamin B-12  1,000 mcg Oral Daily   . sodium chloride 50 mL/hr at 04/30/16 1900    Allergies:  Allergies  Allergen Reactions  . Other     Ground red pepper lotion to rub on her knee caused SOB    Social History   Social History  . Marital Status: Widowed    Spouse Name: N/A  . Number of Children: N/A  . Years of Education: N/A   Occupational History  . Not on file.   Social History Main Topics  . Smoking status: Never Smoker   . Smokeless tobacco: Never Used  . Alcohol Use: Yes     Comment: "I have had some alcohol in my younger days; a long time ago""  . Drug Use: No  . Sexual Activity: Not on file   Other Topics Concern  . Not on file   Social History Narrative     Family History  Problem Relation Age of Onset  . Diabetes Father      Review of Systems: Negative except as per HPI with the exception of:  Foot pain, foot swelling All other systems reviewed and are otherwise negative except as noted above.  Physical Exam: Blood pressure 199/52, pulse 52, temperature 98.5 F (36.9 C), temperature source Oral, resp. rate 16, height 5\' 5"  (1.651 m), weight 132 lb (59.875 kg), SpO2 98 %. Pt is alert and oriented, WD, WN, pleasant elderly woman in no distress. HEENT: normal Neck: JVP normal. Carotid upstrokes normal. No thyromegaly. Lungs: equal expansion, clear bilaterally CV: Apex is discrete and nondisplaced, RRR with 2/6 SEM at the RUSB, faint  diastolic murmur at the apex Abd: soft, NT, +BS, no bruit, no hepatosplenomegaly Back: no CVA tenderness Ext: no C/C/E Skin: warm and dry without rash Neuro: CNII-XII intact             Strength intact = bilaterally    Labs:  Recent Labs  04/30/16 1842 04/30/16 2242 05/01/16 0427  TROPONINI 0.03 0.07* 0.03   Lab Results  Component Value Date   WBC 6.9 05/01/2016   HGB 11.3* 05/01/2016   HCT 34.6* 05/01/2016   MCV 96.1 05/01/2016   PLT 135* 05/01/2016    Recent Labs Lab 05/01/16 0422  NA 138  K 3.8  CL 108  CO2 21*  BUN 21*  CREATININE 1.31*  CALCIUM 8.1*  PROT 5.0*  BILITOT  3.2*  ALKPHOS 209*  ALT 200*  AST 104*  GLUCOSE 207*   Lab Results  Component Value Date   CHOL 160 08/21/2015   HDL 71.80 08/21/2015   LDLCALC 68 08/21/2015   TRIG 105.0 08/21/2015   No results found for: DDIMER  Radiology/Studies:  Dg Chest 2 View  04/30/2016  CLINICAL DATA:  Headache and high blood pressure EXAM: CHEST  2 VIEW COMPARISON:  12/06/2015 FINDINGS: There is mild elevation of the right hemidiaphragm. There is no focal parenchymal opacity. There is no pleural effusion or pneumothorax. The heart and mediastinal contours are unremarkable. The osseous structures are unremarkable. IMPRESSION: No active cardiopulmonary disease. Electronically Signed   By: Kathreen Devoid   On: 04/30/2016 12:19    EKG: Sinus rhythm with second-degree Mobitz 1 AV block, right bundle branch block, left anterior fascicular block  Cardiac Studies: 2-D echocardiogram 03/04/2015: Study Conclusions  - Left ventricle: The cavity size was normal. There was moderate concentric hypertrophy. Systolic function was normal. The estimated ejection fraction was in the range of 60% to 65%. Wall motion was normal; there were no regional wall motion abnormalities. Doppler parameters are consistent with abnormal left ventricular relaxation (grade 1 diastolic dysfunction). Doppler parameters are  consistent with high ventricular filling pressure. - Aortic valve: Mildly thickened, mildly calcified leaflets. Minimal aortic stenosis. Peak velocity 2.12 m/s. There was mild regurgitation. Mean gradient (S): 9 mm Hg. Valve area (Vmax): 1.19 cm^2. - Mitral valve: Calcified annulus. Mildly thickened leaflets . There was mild to moderate regurgitation. - Left atrium: The atrium was mildly dilated. Volume/bsa, S: 31.2 ml/m^2. - Pulmonary arteries: PA peak pressure: 39 mm Hg (S). Mildly elevated pulmonary pressures. - Systemic veins: IVC appears dilated. Unable to accurately assess respiratory variation.  ASSESSMENT AND PLAN:  82 year old woman with trifascicular block (right bundle branch block, left anterior fascicular block, Mobitz 1 AV block). She has heart palpitations but no syncope, presyncope, angina, or signs of heart failure. Considering her very advanced age, it seems reasonable to manage her conservatively. I have recommended a 48-hour Holter monitor and she is agreeable with this.  Regarding hypertensive urgency, her systolic blood pressure remains markedly elevated. AV nodal blocking agents obviously need to be avoided. It might be reasonable to use a combination of amlodipine and a thiazide diuretic.  Will make arrangements for hospital follow-up including placement of an outpatient Holter monitor.  Deatra James MD, [facility] 05/01/2016, 11:55 AM

## 2016-05-02 ENCOUNTER — Observation Stay (HOSPITAL_BASED_OUTPATIENT_CLINIC_OR_DEPARTMENT_OTHER): Payer: Commercial Managed Care - HMO

## 2016-05-02 ENCOUNTER — Other Ambulatory Visit: Payer: Self-pay | Admitting: Cardiology

## 2016-05-02 ENCOUNTER — Other Ambulatory Visit: Payer: Self-pay

## 2016-05-02 DIAGNOSIS — N183 Chronic kidney disease, stage 3 (moderate): Secondary | ICD-10-CM | POA: Diagnosis not present

## 2016-05-02 DIAGNOSIS — I160 Hypertensive urgency: Secondary | ICD-10-CM | POA: Diagnosis not present

## 2016-05-02 DIAGNOSIS — I350 Nonrheumatic aortic (valve) stenosis: Secondary | ICD-10-CM

## 2016-05-02 DIAGNOSIS — I10 Essential (primary) hypertension: Secondary | ICD-10-CM | POA: Diagnosis not present

## 2016-05-02 DIAGNOSIS — I453 Trifascicular block: Secondary | ICD-10-CM | POA: Diagnosis not present

## 2016-05-02 DIAGNOSIS — R001 Bradycardia, unspecified: Secondary | ICD-10-CM

## 2016-05-02 LAB — COMPREHENSIVE METABOLIC PANEL
ALT: 166 U/L — ABNORMAL HIGH (ref 14–54)
AST: 86 U/L — AB (ref 15–41)
Albumin: 2.6 g/dL — ABNORMAL LOW (ref 3.5–5.0)
Alkaline Phosphatase: 227 U/L — ABNORMAL HIGH (ref 38–126)
Anion gap: 9 (ref 5–15)
BUN: 24 mg/dL — AB (ref 6–20)
CHLORIDE: 107 mmol/L (ref 101–111)
CO2: 23 mmol/L (ref 22–32)
Calcium: 8.6 mg/dL — ABNORMAL LOW (ref 8.9–10.3)
Creatinine, Ser: 1.41 mg/dL — ABNORMAL HIGH (ref 0.44–1.00)
GFR, EST AFRICAN AMERICAN: 35 mL/min — AB (ref >60)
GFR, EST NON AFRICAN AMERICAN: 30 mL/min — AB (ref >60)
Glucose, Bld: 164 mg/dL — ABNORMAL HIGH (ref 65–99)
POTASSIUM: 4.9 mmol/L (ref 3.5–5.1)
Sodium: 139 mmol/L (ref 135–145)
TOTAL PROTEIN: 5.2 g/dL — AB (ref 6.5–8.1)
Total Bilirubin: 2.5 mg/dL — ABNORMAL HIGH (ref 0.3–1.2)

## 2016-05-02 LAB — CBC
HCT: 34.7 % — ABNORMAL LOW (ref 36.0–46.0)
Hemoglobin: 11.4 g/dL — ABNORMAL LOW (ref 12.0–15.0)
MCH: 32.9 pg (ref 26.0–34.0)
MCHC: 32.9 g/dL (ref 30.0–36.0)
MCV: 100.3 fL — AB (ref 78.0–100.0)
PLATELETS: 125 10*3/uL — AB (ref 150–400)
RBC: 3.46 MIL/uL — ABNORMAL LOW (ref 3.87–5.11)
RDW: 13.8 % (ref 11.5–15.5)
WBC: 6.9 10*3/uL (ref 4.0–10.5)

## 2016-05-02 LAB — ECHOCARDIOGRAM COMPLETE
Height: 65 in
WEIGHTICAEL: 2153.6 [oz_av]

## 2016-05-02 LAB — GLUCOSE, CAPILLARY
GLUCOSE-CAPILLARY: 175 mg/dL — AB (ref 65–99)
Glucose-Capillary: 185 mg/dL — ABNORMAL HIGH (ref 65–99)

## 2016-05-02 MED ORDER — BISACODYL 5 MG PO TBEC
10.0000 mg | DELAYED_RELEASE_TABLET | Freq: Every day | ORAL | Status: DC | PRN
  Administered 2016-05-02: 10 mg via ORAL
  Filled 2016-05-02: qty 2

## 2016-05-02 MED ORDER — AMLODIPINE BESYLATE 5 MG PO TABS: 5.0000 mg | ORAL_TABLET | Freq: Every day | ORAL | Status: DC

## 2016-05-02 MED ORDER — BISACODYL 5 MG PO TBEC
5.0000 mg | DELAYED_RELEASE_TABLET | Freq: Every day | ORAL | Status: DC | PRN
  Filled 2016-05-02: qty 1

## 2016-05-02 NOTE — Patient Outreach (Signed)
[facility]) Care Management  05/02/2016  Meuy Binetti Braddy Sep 23, 1915 [id number]  Patient noted to be hospitalized on 04-30-16.  Message sent to hospital liaison notifying of admit. Letter sent to physician notifying of health coach discipline closure and community nurse to follow once discharged.    [name], RN, MSN [place] [phone]

## 2016-05-02 NOTE — Progress Notes (Signed)
    Subjective:  Feels ok. No chest pain palpitations or shortness of breath  Objective:  Vital Signs in the last 24 hours: Temp:  [97.8 F (36.6 C)] 97.8 F (36.6 C) (05/04 1305) Pulse Rate:  [52-62] 62 (05/04 1305) Resp:  [20] 20 (05/04 1305) BP: (148-179)/(45-60) 158/45 mmHg (05/04 1305) SpO2:  [98 %] 98 % (05/04 1305) Weight:  [134 lb 9.6 oz (61.054 kg)] 134 lb 9.6 oz (61.054 kg) (05/04 0500)  Intake/Output from previous day: 05/03 0701 - 05/04 0700 In: [id number] [P.O.:720; I.V.:1000] Out: 1450 [Urine:1450]  Physical Exam: Pt is alert and oriented, elderly woman in NAD HEENT: normal Neck: JVP - normal, carotids 2+= without bruits Lungs: CTA bilaterally CV: RRR with 2/6 SEM at the RUSB Abd: soft, NT, Positive BS, no hepatomegaly Ext: no C/C/E, distal pulses intact and equal Skin: warm/dry no rash  Lab Results:  Recent Labs  05/01/16 0422 05/02/16 0342  WBC 6.9 6.9  HGB 11.3* 11.4*  PLT 135* 125*    Recent Labs  05/01/16 0422 05/02/16 0342  NA 138 139  K 3.8 4.9  CL 108 107  CO2 21* 23  GLUCOSE 207* 164*  BUN 21* 24*  CREATININE 1.31* 1.41*    Recent Labs  04/30/16 2242 05/01/16 0427  TROPONINI 0.07* 0.03    Cardiac Studies: 2D Echo: Study Conclusions  - Left ventricle: The cavity size was normal. Wall thickness was  increased in a pattern of mild LVH. Systolic function was  vigorous. The estimated ejection fraction was in the range of 65%  to 70%. Doppler parameters are consistent with abnormal left  ventricular relaxation (grade 1 diastolic dysfunction). - Aortic valve: AV is thickened with minimally restricted motion  Peak and mean gradients through the valveare 18 and 9 mm Hg  respectviely. There was mild regurgitation. Valve area (VTI):  1.26 cm^2. Valve area (Vmax): 1.08 cm^2. Valve area (Vmean): 1.21  cm^2. - Mitral valve: There was mild regurgitation. - Left atrium: The atrium was moderately to severely dilated. - Pulmonary  arteries: PA peak pressure: 33 mm Hg (S).  Tele: Sinus rhythm  Assessment/Plan:  1. Trifascicular block: appears to be asymptomatic. With advanced age and no sx's continue observation and medical therapy for hypertension avoiding AV nodal blockers. Check 48 hour monitor  2. Mild valvular heart disease: echo reviewed and no significant lesions noted  3. Hypertensive urgency: BP reasonably controlled now on amlodipine/HCT  Will arrange monitor and FU office visit. Stable for discharge   Sherren Mocha, M.D. 05/02/2016, 2:36 PM

## 2016-05-02 NOTE — Consult Note (Signed)
[facility] CM Inpatient Consult   05/02/2016  [name] 21-Sep-1915 [id number]    Patient is currently active with [facility] for chronic disease management services with her [facility]. .  Patient has been engaged by a [place].  Met with the patient at bedside.  She admitted with Hypertensive Urgency, Chronic Renal Disease.  She confirms her caregivers to be Engineer, manufacturing.  She verbalized ongoing post hospital monitorng when she returns home.  She states her son is her emergency contact, Tereasa Coop and that Dr. Ladona Ridgel is her primary care provider. .  Consent form signed.  Our community based plan of care has focused on disease management and community resource support.  Patient will receive a post discharge transition of care call with the RN [facility] and will be evaluated for monthly home visits for assessments and disease process education.  Made Inpatient Case Manager aware that [facility] following. Of note, [facility] Care Management services does not replace or interfere with any services that are needed or arranged by inpatient case management or social work.  For additional questions or referrals please contact:  Natividad Brood, RN BSN [facility] Liaison  [phone] business mobile phone Toll free office [phone]

## 2016-05-02 NOTE — Discharge Instructions (Signed)
Hypertension Hypertension, commonly called high blood pressure, is when the force of blood pumping through your arteries is too strong. Your arteries are the blood vessels that carry blood from your heart throughout your body. A blood pressure reading consists of a higher number over a lower number, such as 110/72. The higher number (systolic) is the pressure inside your arteries when your heart pumps. The lower number (diastolic) is the pressure inside your arteries when your heart relaxes. Ideally you want your blood pressure below 120/80. Hypertension forces your heart to work harder to pump blood. Your arteries may become narrow or stiff. Having untreated or uncontrolled hypertension can cause heart attack, stroke, kidney disease, and other problems. RISK FACTORS Some risk factors for high blood pressure are controllable. Others are not.  Risk factors you cannot control include:   Race. You may be at higher risk if you are African American.  Age. Risk increases with age.  Gender. Men are at higher risk than women before age 45 years. After age 65, women are at higher risk than men. Risk factors you can control include:  Not getting enough exercise or physical activity.  Being overweight.  Getting too much fat, sugar, calories, or salt in your diet.  Drinking too much alcohol. SIGNS AND SYMPTOMS Hypertension does not usually cause signs or symptoms. Extremely high blood pressure (hypertensive crisis) may cause headache, anxiety, shortness of breath, and nosebleed. DIAGNOSIS To check if you have hypertension, your health care provider will measure your blood pressure while you are seated, with your arm held at the level of your heart. It should be measured at least twice using the same arm. Certain conditions can cause a difference in blood pressure between your right and left arms. A blood pressure reading that is higher than normal on one occasion does not mean that you need treatment. If  it is not clear whether you have high blood pressure, you may be asked to return on a different day to have your blood pressure checked again. Or, you may be asked to monitor your blood pressure at home for 1 or more weeks. TREATMENT Treating high blood pressure includes making lifestyle changes and possibly taking medicine. Living a healthy lifestyle can help lower high blood pressure. You may need to change some of your habits. Lifestyle changes may include:  Following the DASH diet. This diet is high in fruits, vegetables, and whole grains. It is low in salt, red meat, and added sugars.  Keep your sodium intake below 2,300 mg per day.  Getting at least 30-45 minutes of aerobic exercise at least 4 times per week.  Losing weight if necessary.  Not smoking.  Limiting alcoholic beverages.  Learning ways to reduce stress. Your health care provider may prescribe medicine if lifestyle changes are not enough to get your blood pressure under control, and if one of the following is true:  You are 18-59 years of age and your systolic blood pressure is above 140.  You are 60 years of age or older, and your systolic blood pressure is above 150.  Your diastolic blood pressure is above 90.  You have diabetes, and your systolic blood pressure is over 140 or your diastolic blood pressure is over 90.  You have kidney disease and your blood pressure is above 140/90.  You have heart disease and your blood pressure is above 140/90. Your personal target blood pressure may vary depending on your medical conditions, your age, and other factors. HOME CARE INSTRUCTIONS    Have your blood pressure rechecked as directed by your health care provider.   Take medicines only as directed by your health care provider. Follow the directions carefully. Blood pressure medicines must be taken as prescribed. The medicine does not work as well when you skip doses. Skipping doses also puts you at risk for  problems.  Do not smoke.   Monitor your blood pressure at home as directed by your health care provider. SEEK MEDICAL CARE IF:   You think you are having a reaction to medicines taken.  You have recurrent headaches or feel dizzy.  You have swelling in your ankles.  You have trouble with your vision. SEEK IMMEDIATE MEDICAL CARE IF:  You develop a severe headache or confusion.  You have unusual weakness, numbness, or feel faint.  You have severe chest or abdominal pain.  You vomit repeatedly.  You have trouble breathing. MAKE SURE YOU:   Understand these instructions.  Will watch your condition.  Will get help right away if you are not doing well or get worse.   This information is not intended to replace advice given to you by your health care provider. Make sure you discuss any questions you have with your health care provider.   Document Released: 12/16/2005 Document Revised: 05/02/2015 Document Reviewed: 10/08/2013 Elsevier Interactive Patient Education 2016 Elsevier Inc.  

## 2016-05-02 NOTE — Discharge Summary (Signed)
Physician Discharge Summary  [name] [id number] DOB: February 26, 1915 DOA: 04/30/2016  PCP: Donnajean Lopes, MD  Admit date: 04/30/2016 Discharge date: 05/02/2016  Recommendations for Outpatient Follow-up:  1. Pt will need to follow up with PCP in 2-3 weeks post discharge 2. Please obtain BMP to evaluate electrolytes and kidney function 3. Please also check CBC to evaluate Hg and Hct levels 4. Please also check LFT to ensure trending down 5. Cardiology team will arrange cardiac monitoring in an outpatient setting   Discharge Diagnoses:  Principal Problem:   Hypertensive urgency Active Problems:   Hypothyroidism   Diabetic neuropathy ([place])   Chronic kidney disease, stage III (moderate)   Hypertensive urgency, malignant   Protein-calorie malnutrition, severe  Discharge Condition: Stable  Diet recommendation: Heart healthy diet discussed in details    Brief Narrative:  81 year old woman presented with hypertensive urgency.   Assessment & Plan:  Principal Problem:  Hypertensive urgency - currently on Norvasc and HCTZ - BP better - conservative management for now  - appreciate cardiology team following   Active Problems:   Trifascicular block - appears to be asymptomatic - With advanced age and no sx's continue observation and medical therapy for HTN avoiding AV nodal blockers     Mild valvular heart disease - echo reviewed and no significant lesions noted   Hypothyroidism - continue synthroid    Diabetes with neuropathy ([place]) - resume home medical regimen    Chronic kidney disease, stage III (moderate) - Cr overall stable   Protein-calorie malnutrition, severe - appreciate nutritionist recommendations    Thrombocytopenia - mild, no signs of bleeding   Transaminitis - ALT > AST - unclear etiology - outpatient follow up   Code Status: Full  Family Communication: Patient at bedside  Disposition Plan: Home   Consultants:    Cardiology  Procedures:   None  Antimicrobials:   None   Discharge Exam: Filed Vitals:   05/01/16 2359 05/02/16 1305  BP: 148/45 158/45  Pulse: 52 62  Temp:  97.8 F (36.6 C)  Resp:  20   Filed Vitals:   05/01/16 2259 05/01/16 2359 05/02/16 0500 05/02/16 1305  BP: 179/45 148/45  158/45  Pulse:  52  62  Temp:    97.8 F (36.6 C)  TempSrc:    Oral  Resp:    20  Height:      Weight:   61.054 kg (134 lb 9.6 oz)   SpO2:    98%    General: Pt is alert, follows commands appropriately, not in acute distress Cardiovascular: Regular rate and rhythm, SEM 2/6, no rubs, no gallops Respiratory: Clear to auscultation bilaterally, no wheezing, no crackles, no rhonchi Abdominal: Soft, non tender, non distended, bowel sounds +, no guarding Extremities: no edema, no cyanosis, pulses palpable bilaterally DP and PT Neuro: Grossly nonfocal  Discharge Instructions  Discharge Instructions    AMB Referral to [facility]    Complete by:  As directed   Reason for consult:  Post hospital follow up  Diagnoses of:   Kidney Failure Diabetes    Expected date of contact:  1-3 days (reserved for hospital discharges)  Active prior to admission with Health Coach.  Please assign to community nurse for transition of care calls and assess for home visits. Consent obtained.  Questions please call:   Natividad Brood, RN BSN [facility] Liaison  [phone] business mobile phone Toll free office [phone]     Diet - low sodium heart healthy  Complete by:  As directed      Increase activity slowly    Complete by:  As directed             Medication List    TAKE these medications        amLODipine 5 MG tablet  Commonly known as:  NORVASC  Take 1 tablet (5 mg total) by mouth daily.     aspirin 81 MG tablet  Take 81 mg by mouth daily.     CALTRATE 600+D 600-400 MG-UNIT tablet  Generic drug:  Calcium Carbonate-Vitamin D  Take 1 tablet by mouth  daily.     CENTRUM SILVER PO  Take 1 tablet by mouth.     ferrous sulfate 325 (65 FE) MG tablet  Take 325 mg by mouth daily with breakfast.     glimepiride 2 MG tablet  Commonly known as:  AMARYL  TAKE 1 TABLET BY MOUTH EVERY MORNING AND 1/2 TABLET AT DINNER     hydrochlorothiazide 25 MG tablet  Commonly known as:  HYDRODIURIL  Take 0.5 tablets (12.5 mg total) by mouth daily.     insulin aspart 100 UNIT/ML FlexPen  Commonly known as:  NOVOLOG FLEXPEN  If blood sugar is over 200, take 5 units and if they go over 300, take 6 units at that meal.     LYRICA 75 MG capsule  Generic drug:  pregabalin  TAKE 1 CAPSULE TWICE DAILY     pantoprazole 40 MG tablet  Commonly known as:  PROTONIX  TAKE 1 TABLET BY MOUTH TWICE DAILY     pravastatin 20 MG tablet  Commonly known as:  PRAVACHOL  Take 20 mg by mouth daily.     SYNTHROID 88 MCG tablet  Generic drug:  levothyroxine  TAKE 1 TABLET DAILY     traMADol 50 MG tablet  Commonly known as:  ULTRAM  Take 1 tablet (50 mg total) by mouth 2 (two) times daily.     vitamin B-12 1000 MCG tablet  Commonly known as:  CYANOCOBALAMIN  Take 1,000 mcg by mouth daily.           Follow-up Information    Follow up with Donnajean Lopes, MD.   Specialty:  Internal Medicine   Contact information:   [address] [place] [address] [phone]       Call Faye Ramsay, MD.   Specialty:  Internal Medicine   Why:  As needed call my cell phone [phone]   Contact information:   [address] [place] [address] [phone]        The results of significant diagnostics from this hospitalization (including imaging, microbiology, ancillary and laboratory) are listed below for reference.     Microbiology: No results found for this or any previous visit (from the past 240 hour(s)).   Labs: Basic Metabolic Panel:  Recent Labs Lab 04/30/16 1103 04/30/16 1842 05/01/16 0422 05/02/16 0342  NA  133*  --  138 139  K 4.3  --  3.8 4.9  CL 102  --  108 107  CO2 21*  --  21* 23  GLUCOSE 294*  --  207* 164*  BUN 30*  --  21* 24*  CREATININE 1.51*  --  1.31* 1.41*  CALCIUM 8.7*  --  8.1* 8.6*  MG 2.1 2.1  --   --    Liver Function Tests:  Recent Labs Lab 04/30/16 1842 05/01/16 0422 05/02/16 0342  AST 124* 104* 86*  ALT 235* 200* 166*  ALKPHOS 202* 209* 227*  BILITOT 3.8* 3.2* 2.5*  PROT 5.6* 5.0* 5.2*  ALBUMIN 2.8* 2.6* 2.6*   CBC:  Recent Labs Lab 04/30/16 1103 05/01/16 0422 05/02/16 0342  WBC 7.6 6.9 6.9  NEUTROABS 6.0  --   --   HGB 11.8* 11.3* 11.4*  HCT 36.0 34.6* 34.7*  MCV 99.2 96.1 100.3*  PLT 126* 135* 125*   Cardiac Enzymes:  Recent Labs Lab 04/30/16 1842 04/30/16 2242 05/01/16 0427  TROPONINI 0.03 0.07* 0.03    CBG:  Recent Labs Lab 05/01/16 1140 05/01/16 1719 05/01/16 2116 05/02/16 0719 05/02/16 1147  GLUCAP 238* 252* 127* 175* 185*   SIGNED: Time coordinating discharge: 30 minutes  MAGICK-MYERS, ISKRA, MD  [facility] 05/02/2016, 2:55 PM Pager [phone]  If 7PM-7AM, please contact night-coverage [email or web address] Password [email or web address]    c

## 2016-05-02 NOTE — Progress Notes (Signed)
Updated report received via [facility] RN , reviewed VS, new orders and events of the day, assumed care of the patient.

## 2016-05-02 NOTE — Progress Notes (Signed)
  Echocardiogram 2D Echocardiogram has been performed.  [name] 05/02/2016, 9:25 AM

## 2016-05-03 ENCOUNTER — Other Ambulatory Visit: Payer: Self-pay | Admitting: *Deleted

## 2016-05-03 NOTE — Patient Outreach (Signed)
Referral received from hospital liaison (covering for assigned care manager, J. Juleen China) to start transition of care program once member discharged.  Admitted for hypertensive urgency and COPD complications, discharged on 5/4.    Call placed to member, this care manager introduced self, identity confirmed.  [facility] care management services explained.  Member state that she is "doing all right.  I just got home late yesterday."  She state that she lives alone, but that she has a son that is very active in her care.  She also report that she has a Product manager" that is with her 2 hours in the morning and 2 hours in the afternoon.  She state that her caregiver has scheduled her follow up appointments and wrote them on her calendar, but she is unable to confirm date at that time as her calendar is in another room.  Member reports checking her blood pressure, stating today it was 134/80s.  She state that her son obtained all medications from the pharmacy, but she is unable to review medications at that time, unable to name them all (she is home alone).  She state that she does take all of her medications as prescribed with the assistance of her caregiver and her son.  She denies any concerns/needs at this time.  Made aware that assigned care manager will contact next week.  Valente David, BSN, [facility]  [facility] Care Manager [phone]

## 2016-05-06 ENCOUNTER — Ambulatory Visit: Payer: Self-pay

## 2016-05-08 ENCOUNTER — Ambulatory Visit (INDEPENDENT_AMBULATORY_CARE_PROVIDER_SITE_OTHER): Payer: Commercial Managed Care - HMO

## 2016-05-08 DIAGNOSIS — R002 Palpitations: Secondary | ICD-10-CM | POA: Diagnosis not present

## 2016-05-08 DIAGNOSIS — I453 Trifascicular block: Secondary | ICD-10-CM | POA: Insufficient documentation

## 2016-05-08 DIAGNOSIS — I441 Atrioventricular block, second degree: Secondary | ICD-10-CM | POA: Insufficient documentation

## 2016-05-09 DIAGNOSIS — R945 Abnormal results of liver function studies: Secondary | ICD-10-CM | POA: Diagnosis not present

## 2016-05-09 DIAGNOSIS — I10 Essential (primary) hypertension: Secondary | ICD-10-CM | POA: Diagnosis not present

## 2016-05-10 ENCOUNTER — Other Ambulatory Visit: Payer: Self-pay

## 2016-05-10 NOTE — Patient Outreach (Signed)
[facility]) Care Management  05/10/2016  Anglina Grahan [place] July 06, 1915 [id number]  RNCM called to complete transition of care. Member reports she has a blood pressure cuff and has checked her blood pressure. Member states systolic was [id number], but does not remember the bottom number. Member reports she has been to primary care office and had blood work drawn. Member has caregivers that assist with meals/cleaning/medications. Member is without questions or concerns at this time.  Plan: continue weekly transition of care engagements.  Thea Silversmith, RN, MSN, [place] Coordinator Cell: [phone]

## 2016-05-16 ENCOUNTER — Other Ambulatory Visit: Payer: Self-pay

## 2016-05-16 VITALS — BP 148/60 | HR 62 | Resp 20 | Ht 64.0 in | Wt 133.0 lb

## 2016-05-16 DIAGNOSIS — I10 Essential (primary) hypertension: Secondary | ICD-10-CM

## 2016-05-16 NOTE — Patient Outreach (Addendum)
[facility]) Care Management  [place]  05/16/2016   Nico Dada Gregori 1915/10/10 [id number]  Subjective: member reports doing well states she is going out to lunch with her friends today.  Objective: BP 148/60 mmHg  Pulse 62  Resp 20  Ht 1.626 m (5\' 4" )  Wt 133 lb (60.328 kg)  BMI 22.82 kg/m2  SpO2 98% Lungs clear, respirations even unlabored, feet with some edema noted left greater than right. Member has support stockings on.  Encounter Medications:  Outpatient Encounter Prescriptions as of 05/16/2016  Medication Sig Note  . amLODipine (NORVASC) 5 MG tablet Take 1 tablet (5 mg total) by mouth daily.   Marland Kitchen aspirin 81 MG tablet Take 81 mg by mouth daily.   . ferrous sulfate 325 (65 FE) MG tablet Take 325 mg by mouth daily with breakfast.   . glimepiride (AMARYL) 2 MG tablet TAKE 1 TABLET BY MOUTH EVERY MORNING AND 1/2 TABLET AT DINNER   . insulin aspart (NOVOLOG FLEXPEN) 100 UNIT/ML FlexPen If blood sugar is over 200, take 5 units and if they go over 300, take 6 units at that meal. (Patient taking differently: Inject 4 Units into the skin daily. 3 units before meals) 05/16/2016: Takes 4 units at each meal   . LYRICA 75 MG capsule TAKE 1 CAPSULE TWICE DAILY (Patient taking differently: take 75mg  every evening)   . Multiple Vitamins-Minerals (CENTRUM SILVER PO) Take 1 tablet by mouth.   . pantoprazole (PROTONIX) 40 MG tablet TAKE 1 TABLET BY MOUTH TWICE DAILY   . pravastatin (PRAVACHOL) 20 MG tablet Take 20 mg by mouth daily.   Marland Kitchen SYNTHROID 88 MCG tablet TAKE 1 TABLET DAILY   . traMADol (ULTRAM) 50 MG tablet Take 1 tablet (50 mg total) by mouth 2 (two) times daily. (Patient taking differently: Take 50 mg by mouth every 12 (twelve) hours as needed for moderate pain. )   . vitamin B-12 (CYANOCOBALAMIN) 1000 MCG tablet Take 1,000 mcg by mouth daily.   . Calcium Carbonate-Vitamin D (CALTRATE 600+D) 600-400 MG-UNIT per tablet Take 1 tablet by mouth daily. Reported on  05/16/2016   . hydrochlorothiazide (HYDRODIURIL) 25 MG tablet Take 0.5 tablets (12.5 mg total) by mouth daily. (Patient not taking: Reported on 05/16/2016)    No facility-administered encounter medications on file as of 05/16/2016.    Functional Status:  In your present state of health, do you have any difficulty performing the following activities: 05/10/2016 04/30/2016  Hearing? [name]  Vision? Y Y  Difficulty concentrating or making decisions? [name]  Walking or climbing stairs? Y Y  Dressing or bathing? N N  Doing errands, shopping? [name]  Preparing Food and eating ? Y -  Using the Toilet? N -  In the past six months, have you accidently leaked urine? Y -  Do you have problems with loss of bowel control? N -  Managing your Medications? Y -  Managing your Finances? Y -  Housekeeping or managing your Housekeeping? Y -    Fall/Depression Screening: PHQ 2/9 Scores 05/10/2016 03/18/2016 02/20/2016 01/24/2016 04/06/2015  PHQ - 2 Score 0 0 0 0 0   Fall Risk  05/16/2016 05/10/2016 03/18/2016 02/20/2016 01/24/2016  Falls in the past year? No No No No No  Risk for fall due to : Impaired balance/gait - - - -  Risk for fall due to (comments): amublates with walker - - - -   Assessment: 82 year old with recent admission for hypertensive urgency.  Present during first part of visit was member's personal care giver, Luellen Pucker. Member is alert, oriented to person, place, time. Member acknowledges that she is sometimes forgetful. Member is without complaints at this time, stating she has plans to go eat lunch with her friends today.  History of hypertensive urgency-blood pressure 148/60 (right arm) manual. Per member's automatic cuff(wrist cuff) 170/69 HR 58(left arm). Member repeated blood pressure manual left arm 150/58. RNCM discussed  correct positioning for checking blood pressure with wrist cuff.  Medications reviewed-member does not have hydrochlorothiazide medication bottle. When asked, member was not sure why she  did not have the medication bottle. RNCM called member's pharmacy who reports that member has not had this medication filled since December 2016 and reports that member does not have any refills. RNCM will consult pharmacy for medication reconciliation, management.  Potential fall risk. Member age 82, ambulates with walker. Denies history of falls. RNCM reviewed fall prevention strategies.  Plan: continue to follow weekly for transition of care engagement.  Thea Silversmith, RN, MSN, [place] Coordinator Cell: [phone]

## 2016-05-23 ENCOUNTER — Other Ambulatory Visit: Payer: Self-pay

## 2016-05-23 NOTE — Patient Outreach (Signed)
[facility]) Care Management  05/23/2016  [name] 11-04-1915 [id number]   Assessment: Transition of care call. Member reports she has recently had monitoring of her heart and she will not have to have a pacemaker place. Member states she is eating good. Personal care service assistant with member. Member reports she is doing well and denies any issues at this time.  Plan: transition of care call next week.  Thea Silversmith, RN, MSN, [place] Coordinator Cell: [phone]

## 2016-05-24 ENCOUNTER — Emergency Department (HOSPITAL_COMMUNITY)
Admission: EM | Admit: 2016-05-24 | Discharge: 2016-05-24 | Disposition: A | Discharge status: Home / Self Care | Payer: Commercial Managed Care - HMO | Attending: Emergency Medicine | Admitting: Emergency Medicine

## 2016-05-24 ENCOUNTER — Emergency Department (HOSPITAL_COMMUNITY): Payer: Commercial Managed Care - HMO

## 2016-05-24 ENCOUNTER — Encounter (HOSPITAL_COMMUNITY): Payer: Self-pay | Admitting: *Deleted

## 2016-05-24 DIAGNOSIS — W01190A Fall on same level from slipping, tripping and stumbling with subsequent striking against furniture, initial encounter: Secondary | ICD-10-CM | POA: Insufficient documentation

## 2016-05-24 DIAGNOSIS — I129 Hypertensive chronic kidney disease with stage 1 through stage 4 chronic kidney disease, or unspecified chronic kidney disease: Secondary | ICD-10-CM | POA: Diagnosis not present

## 2016-05-24 DIAGNOSIS — I951 Orthostatic hypotension: Secondary | ICD-10-CM | POA: Diagnosis not present

## 2016-05-24 DIAGNOSIS — S0990XA Unspecified injury of head, initial encounter: Secondary | ICD-10-CM | POA: Diagnosis not present

## 2016-05-24 DIAGNOSIS — E1122 Type 2 diabetes mellitus with diabetic chronic kidney disease: Secondary | ICD-10-CM | POA: Insufficient documentation

## 2016-05-24 DIAGNOSIS — Y92009 Unspecified place in unspecified non-institutional (private) residence as the place of occurrence of the external cause: Secondary | ICD-10-CM | POA: Insufficient documentation

## 2016-05-24 DIAGNOSIS — Y939 Activity, unspecified: Secondary | ICD-10-CM | POA: Insufficient documentation

## 2016-05-24 DIAGNOSIS — Z7984 Long term (current) use of oral hypoglycemic drugs: Secondary | ICD-10-CM | POA: Diagnosis not present

## 2016-05-24 DIAGNOSIS — T1490 Injury, unspecified: Secondary | ICD-10-CM | POA: Diagnosis not present

## 2016-05-24 DIAGNOSIS — E039 Hypothyroidism, unspecified: Secondary | ICD-10-CM | POA: Insufficient documentation

## 2016-05-24 DIAGNOSIS — Z79899 Other long term (current) drug therapy: Secondary | ICD-10-CM | POA: Insufficient documentation

## 2016-05-24 DIAGNOSIS — Z7982 Long term (current) use of aspirin: Secondary | ICD-10-CM | POA: Insufficient documentation

## 2016-05-24 DIAGNOSIS — Z794 Long term (current) use of insulin: Secondary | ICD-10-CM | POA: Insufficient documentation

## 2016-05-24 DIAGNOSIS — N183 Chronic kidney disease, stage 3 (moderate): Secondary | ICD-10-CM | POA: Insufficient documentation

## 2016-05-24 DIAGNOSIS — Y999 Unspecified external cause status: Secondary | ICD-10-CM | POA: Insufficient documentation

## 2016-05-24 DIAGNOSIS — R42 Dizziness and giddiness: Secondary | ICD-10-CM | POA: Diagnosis not present

## 2016-05-24 DIAGNOSIS — W19XXXA Unspecified fall, initial encounter: Secondary | ICD-10-CM

## 2016-05-24 LAB — URINE MICROSCOPIC-ADD ON: RBC / HPF: NONE SEEN RBC/hpf (ref 0–5)

## 2016-05-24 LAB — URINALYSIS, ROUTINE W REFLEX MICROSCOPIC
BILIRUBIN URINE: NEGATIVE
Glucose, UA: 250 mg/dL — AB
Hgb urine dipstick: NEGATIVE
KETONES UR: NEGATIVE mg/dL
Nitrite: NEGATIVE
PROTEIN: NEGATIVE mg/dL
Specific Gravity, Urine: 1.015 (ref 1.005–1.030)
pH: 6.5 (ref 5.0–8.0)

## 2016-05-24 LAB — I-STAT CHEM 8, ED
BUN: 36 mg/dL — ABNORMAL HIGH (ref 6–20)
CREATININE: 1.6 mg/dL — AB (ref 0.44–1.00)
Calcium, Ion: 1.19 mmol/L (ref 1.13–1.30)
Chloride: 99 mmol/L — ABNORMAL LOW (ref 101–111)
GLUCOSE: 223 mg/dL — AB (ref 65–99)
HEMATOCRIT: 36 % (ref 36.0–46.0)
HEMOGLOBIN: 12.2 g/dL (ref 12.0–15.0)
Potassium: 4.7 mmol/L (ref 3.5–5.1)
Sodium: 136 mmol/L (ref 135–145)
TCO2: 27 mmol/L (ref 0–100)

## 2016-05-24 MED ORDER — SODIUM CHLORIDE 0.9 % IV BOLUS (SEPSIS)
500.0000 mL | Freq: Once | INTRAVENOUS | Status: AC
  Administered 2016-05-24: 500 mL via INTRAVENOUS

## 2016-05-24 NOTE — ED Provider Notes (Signed)
CSN: [id number]     Arrival date & time 05/24/16  0416 History   First MD Initiated Contact with Patient 05/24/16 [phone]     Chief Complaint  Patient presents with  . Fall     (Consider location/radiation/quality/duration/timing/severity/associated sxs/prior Treatment) HPI Patient presents after slip and fall from standing at home. States she got up to go to the bathroom. She sat on the edge of the bed. She became lightheaded and slipped to the floor. States she hit the left side for face on the nightstand. There was no loss of consciousness. She denied any pain at any point including chest pain or abdominal pain. Denies any recent fever or chills. She had difficulty getting up off the floor and therefore called EMS. Denies any focal weakness or numbness. No urinary symptoms.  Past Medical History  Diagnosis Date  . Hypertension   . Tuberculosis 1930s  . Type II diabetes mellitus ([place]) since 1964  . Pneumonia 1930s; ? 11/2015  . History of blood transfusion     "when my son was born; maybe once since" (04/30/2016)  . Chronic kidney disease (CKD), stage III (moderate)     [name] 12/06/2015  . Hypothyroidism    Past Surgical History  Procedure Laterality Date  . Hip fracture surgery Bilateral   . Fracture surgery    . Tonsillectomy    . Cataract extraction w/ intraocular lens  implant, bilateral Bilateral   . Esophagogastroduodenoscopy N/A 03/03/2015    Procedure: ESOPHAGOGASTRODUODENOSCOPY (EGD);  Surgeon: Inda Castle, MD;  Location: [facility];  Service: Endoscopy;  Laterality: N/A;  . Esophagogastroduodenoscopy N/A 03/07/2015    Procedure: ESOPHAGOGASTRODUODENOSCOPY (EGD);  Surgeon: Jerene Bears, MD;  Location: [facility] ENDOSCOPY;  Service: Endoscopy;  Laterality: N/A;   Family History  Problem Relation Age of Onset  . Diabetes Father    Social History  Substance Use Topics  . Smoking status: Never Smoker   . Smokeless tobacco: Never Used  . Alcohol Use: Yes     Comment: "I have  had some alcohol in my younger days; a long time ago""   OB History    No data available     Review of Systems  Constitutional: Negative for fever and chills.  Eyes: Negative for visual disturbance.  Respiratory: Negative for shortness of breath.   Cardiovascular: Negative for chest pain.  Gastrointestinal: Negative for nausea, vomiting, abdominal pain and diarrhea.  Genitourinary: Negative for dysuria, frequency, hematuria and flank pain.  Musculoskeletal: Negative for back pain, arthralgias and neck pain.  Neurological: Positive for dizziness and light-headedness. Negative for syncope, weakness, numbness and headaches.  All other systems reviewed and are negative.     Allergies  Other  Home Medications   Prior to Admission medications   Medication Sig Start Date End Date Taking? Authorizing Provider  amLODipine (NORVASC) 5 MG tablet Take 1 tablet (5 mg total) by mouth daily. 05/02/16  Yes Theodis Blaze, MD  aspirin 81 MG tablet Take 81 mg by mouth daily.   Yes Historical Provider, MD  Calcium Carbonate-Vitamin D (CALTRATE 600+D) 600-400 MG-UNIT per tablet Take 1 tablet by mouth daily. Reported on 05/16/2016   Yes Historical Provider, MD  ferrous sulfate 325 (65 FE) MG tablet Take 325 mg by mouth daily with breakfast.   Yes Historical Provider, MD  glimepiride (AMARYL) 2 MG tablet TAKE 1 TABLET BY MOUTH EVERY MORNING AND 1/2 TABLET AT [facility] 07/17/15  Yes Elayne Snare, MD  hydrochlorothiazide (HYDRODIURIL) 25 MG tablet Take 0.5  tablets (12.5 mg total) by mouth daily. Patient taking differently: Take 12.5 mg by mouth every other day.  12/11/15  Yes Charlesetta Shanks, MD  insulin aspart (NOVOLOG FLEXPEN) 100 UNIT/ML FlexPen If blood sugar is over 200, take 5 units and if they go over 300, take 6 units at that meal. Patient taking differently: Inject 4-5 Units into the skin daily.  02/16/15  Yes Elayne Snare, MD  LYRICA 75 MG capsule TAKE 1 CAPSULE TWICE DAILY Patient taking differently: take  75mg  every evening 01/29/16  Yes Elayne Snare, MD  Multiple Vitamins-Minerals (CENTRUM SILVER PO) Take 1 tablet by mouth.   Yes Historical Provider, MD  pantoprazole (PROTONIX) 40 MG tablet TAKE 1 TABLET BY MOUTH TWICE DAILY 09/08/15  Yes Elayne Snare, MD  pravastatin (PRAVACHOL) 20 MG tablet Take 20 mg by mouth daily. 06/25/15  Yes Historical Provider, MD  SYNTHROID 88 MCG tablet TAKE 1 TABLET DAILY 08/18/15  Yes Elayne Snare, MD  traMADol (ULTRAM) 50 MG tablet Take 1 tablet (50 mg total) by mouth 2 (two) times daily. Patient taking differently: Take 50 mg by mouth every 12 (twelve) hours as needed for moderate pain.  09/15/15  Yes Elayne Snare, MD  vitamin B-12 (CYANOCOBALAMIN) 1000 MCG tablet Take 1,000 mcg by mouth daily.   Yes Historical Provider, MD   BP 175/47 mmHg  Pulse 51  Temp(Src) 97.7 F (36.5 C)  Resp 13  SpO2 97% Physical Exam  Constitutional: She is oriented to person, place, and time. She appears well-developed and well-nourished. No distress.  HENT:  Head: Normocephalic and atraumatic.  Mouth/Throat: Oropharynx is clear and moist.  No facial swelling or deformity. No malocclusion. No obvious injury.  Eyes: EOM are normal. Pupils are equal, round, and reactive to light.  Neck: Normal range of motion. Neck supple.  No midline cervical tenderness to palpation.  Cardiovascular: Normal rate and regular rhythm.  Exam reveals no gallop and no friction rub.   No murmur heard. Pulmonary/Chest: Effort normal and breath sounds normal. No respiratory distress. She has no wheezes. She has no rales. She exhibits no tenderness.  Abdominal: Soft. Bowel sounds are normal. She exhibits no distension and no mass. There is no tenderness. There is no rebound and no guarding.  Musculoskeletal: Normal range of motion. She exhibits no edema or tenderness.  Full range of motion of all joints. Distal pulses are equal and intact. No midline thoracic lumbar tenderness.  Pelvis is stable.  Neurological: She  is alert and oriented to person, place, and time.  Patient is alert and oriented x3 with clear, goal oriented speech. Patient has 5/5 motor in all extremities. Sensation is intact to light touch.   Skin: Skin is warm and dry. No rash noted. No erythema.  Psychiatric: She has a normal mood and affect. Her behavior is normal.  Nursing note and vitals reviewed.   ED Course  Procedures (including critical care time) Labs Review Labs Reviewed  URINALYSIS, ROUTINE W REFLEX MICROSCOPIC (NOT AT [facility]) - Abnormal; Notable for the following:    Glucose, UA 250 (*)    Leukocytes, UA TRACE (*)    All other components within normal limits  URINE MICROSCOPIC-ADD ON - Abnormal; Notable for the following:    Squamous Epithelial / LPF 0-5 (*)    Bacteria, UA RARE (*)    All other components within normal limits  I-STAT CHEM 8, ED - Abnormal; Notable for the following:    Chloride 99 (*)    BUN 36 (*)  Creatinine, Ser 1.60 (*)    Glucose, Bld 223 (*)    All other components within normal limits    Imaging Review No results found. I have personally reviewed and evaluated these images and lab results as part of my medical decision-making.   EKG Interpretation   Date/Time:  Friday May 24 2016 04:51:59 EDT Ventricular Rate:  47 PR Interval:  293 QRS Duration: 149 QT Interval:  494 QTC Calculation: 437 R Axis:   -87 Text Interpretation:  Sinus or ectopic atrial bradycardia Atrial premature  complexes Prolonged PR interval RBBB and LAFB Confirmed by Lita Mains  MD,  Kenith Trickel ([id number]) on 05/24/2016 5:51:22 AM      MDM   Final diagnoses:  Fall from standing, initial encounter  Orthostasis   Patient is very well-appearing. She has a normal neurologic exam.   CT head without any acute findings. Think dehydration likely playing a role in the patient's lightheadedness. Given IV fluids and advised to increased water consumption. Follow-up with primary physician. Return precautions  given.   Julianne Rice, MD 05/27/16 1300

## 2016-05-24 NOTE — ED Notes (Signed)
Pt. arrived with EMS from home , lost her balance while trying to get out of bed this morning fell on carpeted floor /hit her face against night stand , denies LOC / alert and oriented.

## 2016-05-24 NOTE — Discharge Instructions (Signed)
Orthostatic Hypotension Orthostatic hypotension is a sudden drop in blood pressure. It happens when you quickly stand up from a seated or lying position. You may feel dizzy or light-headed. This can last for just a few seconds or for up to a few minutes. It is usually not a serious problem. However, if this happens frequently or gets worse, it can be a sign of something more serious. CAUSES  Different things can cause orthostatic hypotension, including:   Loss of body fluids (dehydration).  Medicines that lower blood pressure.  Sudden changes in posture, such as standing up quickly after you have been sitting or lying down.  Taking too much of your medicine. SIGNS AND SYMPTOMS   Light-headedness or dizziness.   Fainting or near-fainting.   A fast heart rate.   Weakness.   Feeling tired (fatigue).  DIAGNOSIS  Your health care provider may do several things to help diagnose your condition and identify the cause. These may include:   Taking a medical history and doing a physical exam.  Checking your blood pressure. Your health care provider will check your blood pressure when you are:  Lying down.  Sitting.  Standing.  Using tilt table testing. In this test, you lie down on a table that moves from a lying position to a standing position. You will be strapped onto the table. This test monitors your blood pressure and heart rate when you are in different positions. TREATMENT  Treatment will vary depending on the cause. Possible treatments include:   Changing the dosage of your medicines.  Wearing compression stockings on your lower legs.  Standing up slowly after sitting or lying down.  Eating more salt.  Eating frequent, small meals.  In some cases, getting IV fluids.  Taking medicine to enhance fluid retention. HOME CARE INSTRUCTIONS  Only take over-the-counter or prescription medicines as directed by your health care provider.  Follow your health care  provider's instructions for changing the dosage of your current medicines.  Do not stop or adjust your medicine on your own.  Stand up slowly after sitting or lying down. This allows your body to adjust to the different position.  Wear compression stockings as directed.  Eat extra salt as directed.  Do not add extra salt to your diet unless directed to by your health care provider.  Eat frequent, small meals.  Avoid standing suddenly after eating.  Avoid hot showers or excessive heat as directed by your health care provider.  Keep all follow-up appointments. SEEK MEDICAL CARE IF:  You continue to feel dizzy or light-headed after standing.  You feel groggy or confused.  You feel cold, clammy, or sick to your stomach (nauseous).  You have blurred vision.  You feel short of breath. SEEK IMMEDIATE MEDICAL CARE IF:   You faint after standing.  You have chest pain.  You have difficulty breathing.   You lose feeling or movement in your arms or legs.   You have slurred speech or difficulty talking, or you are unable to talk.  MAKE SURE YOU:   Understand these instructions.  Will watch your condition.  Will get help right away if you are not doing well or get worse.   This information is not intended to replace advice given to you by your health care provider. Make sure you discuss any questions you have with your health care provider.   Document Released: 12/06/2002 Document Revised: 12/21/2013 Document Reviewed: 10/08/2013 Elsevier Interactive Patient Education 2016 Elsevier Inc. Dehydration, Adult Dehydration  is a condition in which you do not have enough fluid or water in your body. It happens when you take in less fluid than you lose. Vital organs such as the kidneys, brain, and heart cannot function without a proper amount of fluids. Any loss of fluids from the body can cause dehydration.  Dehydration can range from mild to severe. This condition should be  treated right away to help prevent it from becoming severe. CAUSES  This condition may be caused by:  Vomiting.  Diarrhea.  Excessive sweating, such as when exercising in hot or humid weather.  Not drinking enough fluid during strenuous exercise or during an illness.  Excessive urine output.  Fever.  Certain medicines. RISK FACTORS This condition is more likely to develop in:  People who are taking certain medicines that cause the body to lose excess fluid (diuretics).   People who have a chronic illness, such as diabetes, that may increase urination.  Older adults.   People who live at high altitudes.   People who participate in endurance sports.  SYMPTOMS  Mild Dehydration  Thirst.  Dry lips.  Slightly dry mouth.  Dry, warm skin. Moderate Dehydration  Very dry mouth.   Muscle cramps.   Dark urine and decreased urine production.   Decreased tear production.   Headache.   Light-headedness, especially when you stand up from a sitting position.  Severe Dehydration  Changes in skin.   Cold and clammy skin.   Skin does not spring back quickly when lightly pinched and released.   Changes in body fluids.   Extreme thirst.   No tears.   Not able to sweat when body temperature is high, such as in hot weather.   Minimal urine production.   Changes in vital signs.   Rapid, weak pulse (more than 100 beats per minute when you are sitting still).   Rapid breathing.   Low blood pressure.   Other changes.   Sunken eyes.   Cold hands and feet.   Confusion.  Lethargy and difficulty being awakened.  Fainting (syncope).   Short-term weight loss.   Unconsciousness. DIAGNOSIS  This condition may be diagnosed based on your symptoms. You may also have tests to determine how severe your dehydration is. These tests may include:   Urine tests.   Blood tests.  TREATMENT  Treatment for this condition depends on the  severity. Mild or moderate dehydration can often be treated at home. Treatment should be started right away. Do not wait until dehydration becomes severe. Severe dehydration needs to be treated at the hospital. Treatment for Mild Dehydration  Drinking plenty of water to replace the fluid you have lost.   Replacing minerals in your blood (electrolytes) that you may have lost.  Treatment for Moderate Dehydration  Consuming oral rehydration solution (ORS). Treatment for Severe Dehydration  Receiving fluid through an IV tube.   Receiving electrolyte solution through a feeding tube that is passed through your nose and into your stomach (nasogastric tube or NG tube).  Correcting any abnormalities in electrolytes. HOME CARE INSTRUCTIONS   Drink enough fluid to keep your urine clear or pale yellow.   Drink water or fluid slowly by taking small sips. You can also try sucking on ice cubes.  Have food or beverages that contain electrolytes. Examples include bananas and sports drinks.  Take over-the-counter and prescription medicines only as told by your health care provider.   Prepare ORS according to the manufacturer's instructions. Take sips of ORS  every 5 minutes until your urine returns to normal.  If you have vomiting or diarrhea, continue to try to drink water, ORS, or both.   If you have diarrhea, avoid:   Beverages that contain caffeine.   Fruit juice.   Milk.   Carbonated soft drinks.  Do not take salt tablets. This can lead to the condition of having too much sodium in your body (hypernatremia).  SEEK MEDICAL CARE IF:  You cannot eat or drink without vomiting.  You have had moderate diarrhea during a period of more than 24 hours.  You have a fever. SEEK IMMEDIATE MEDICAL CARE IF:   You have extreme thirst.  You have severe diarrhea.  You have not urinated in 6-8 hours, or you have urinated only a small amount of very dark urine.  You have shriveled  skin.  You are dizzy, confused, or both.   This information is not intended to replace advice given to you by your health care provider. Make sure you discuss any questions you have with your health care provider.   Document Released: 12/16/2005 Document Revised: 09/06/2015 Document Reviewed: 05/03/2015 Elsevier Interactive Patient Education [employer].

## 2016-05-28 ENCOUNTER — Other Ambulatory Visit: Payer: Self-pay | Admitting: Pharmacist

## 2016-05-28 NOTE — Patient Outreach (Signed)
[facility]) Care Management  05/28/2016  [name] [birth year] [id number]   [name] is a 82 yo who was referred to [place] for medication management.  Phone call to patient.  Explained purpose of call was to set up home visit to review medications with patient and discuss medication management.  Patient reports she is unable to schedule a home visit this week.  She requested that home visit be scheduled for next week.  Patient denies any concerns with her medications today and reports she has all her medications.  I set up an initial home visit for 06/04/16 at 1:00 PM.     Elisabeth Most, Pharm.D. Pharmacy Resident [place] [phone]

## 2016-05-29 ENCOUNTER — Other Ambulatory Visit: Payer: Self-pay

## 2016-05-29 NOTE — Patient Outreach (Signed)
[facility]) Care Management  05/29/2016  [name] 01/06/1915 [id number]  Subjective: "I'm a little bruised up, but I'm ok".  Assessment: RNCM called for transition of care. Noted member presented to the Emergency room following a fall. Member reports some bruising, but with no specific problems or issues at this time. Member states she has been checking her blood pressure, but does not remember exactly what her last reading is, but reports it was ok.  Plan: home visit.  Thea Silversmith, RN, MSN, [place] Coordinator Cell: [phone]

## 2016-05-31 DIAGNOSIS — I10 Essential (primary) hypertension: Secondary | ICD-10-CM | POA: Diagnosis not present

## 2016-05-31 DIAGNOSIS — I453 Trifascicular block: Secondary | ICD-10-CM | POA: Diagnosis not present

## 2016-05-31 DIAGNOSIS — E1151 Type 2 diabetes mellitus with diabetic peripheral angiopathy without gangrene: Secondary | ICD-10-CM | POA: Diagnosis not present

## 2016-05-31 DIAGNOSIS — E039 Hypothyroidism, unspecified: Secondary | ICD-10-CM | POA: Diagnosis not present

## 2016-05-31 DIAGNOSIS — E785 Hyperlipidemia, unspecified: Secondary | ICD-10-CM | POA: Diagnosis not present

## 2016-05-31 DIAGNOSIS — R945 Abnormal results of liver function studies: Secondary | ICD-10-CM | POA: Diagnosis not present

## 2016-05-31 DIAGNOSIS — I739 Peripheral vascular disease, unspecified: Secondary | ICD-10-CM | POA: Diagnosis not present

## 2016-05-31 DIAGNOSIS — D649 Anemia, unspecified: Secondary | ICD-10-CM | POA: Diagnosis not present

## 2016-05-31 DIAGNOSIS — N183 Chronic kidney disease, stage 3 (moderate): Secondary | ICD-10-CM | POA: Diagnosis not present

## 2016-06-04 ENCOUNTER — Other Ambulatory Visit: Payer: Self-pay | Admitting: Pharmacist

## 2016-06-04 ENCOUNTER — Other Ambulatory Visit: Payer: Self-pay

## 2016-06-04 NOTE — Patient Outreach (Signed)
[place] [facility]) Care Management  [name]  06/04/2016   [name] 09-18-1915 [id number]  Subjective: Member with no complaints at this time. "I am feeling ok". Member reports she is planning to move to [facility] within the month. Member reports she adjust to things easily and will just have   Objective: BP 154/60 mmHg  Pulse 64  Resp 20  SpO2 97%, lungs clear, decreased, heart rate regular   Encounter Medications:  Outpatient Encounter Prescriptions as of 06/04/2016  Medication Sig  . amLODipine (NORVASC) 5 MG tablet Take 1 tablet (5 mg total) by mouth daily.  Marland Kitchen aspirin 81 MG tablet Take 81 mg by mouth daily.  . Calcium Carbonate-Vitamin D (CALTRATE 600+D) 600-400 MG-UNIT per tablet Take 1 tablet by mouth daily. Reported on 06/04/2016  . ferrous sulfate 325 (65 FE) MG tablet Take 325 mg by mouth daily with breakfast.  . glimepiride (AMARYL) 2 MG tablet TAKE 1 TABLET BY MOUTH EVERY MORNING AND 1/2 TABLET AT DINNER (Patient taking differently: TAKE 1/2 TABLET BY MOUTH EVERY MORNING AND 1/2 TABLET AT DINNER)  . hydrochlorothiazide (HYDRODIURIL) 25 MG tablet Take 0.5 tablets (12.5 mg total) by mouth daily. (Patient taking differently: Take 12.5 mg by mouth every other day. )  . insulin aspart (NOVOLOG FLEXPEN) 100 UNIT/ML FlexPen If blood sugar is over 200, take 5 units and if they go over 300, take 6 units at that meal. (Patient taking differently: Inject 4-5 Units into the skin daily. )  . LYRICA 75 MG capsule TAKE 1 CAPSULE TWICE DAILY (Patient taking differently: take 75mg  every evening)  . Multiple Vitamins-Minerals (CENTRUM SILVER PO) Take 1 tablet by mouth.  . pantoprazole (PROTONIX) 40 MG tablet TAKE 1 TABLET BY MOUTH TWICE DAILY  . pravastatin (PRAVACHOL) 20 MG tablet Take 20 mg by mouth daily.  Marland Kitchen SYNTHROID 88 MCG tablet TAKE 1 TABLET DAILY  . traMADol (ULTRAM) 50 MG tablet Take 1 tablet (50 mg total) by mouth 2 (two) times daily. (Patient taking  differently: Take 50 mg by mouth every 12 (twelve) hours as needed for moderate pain. )  . vitamin B-12 (CYANOCOBALAMIN) 1000 MCG tablet Take 1,000 mcg by mouth daily.   No facility-administered encounter medications on file as of 06/04/2016.    Functional Status:  In your present state of health, do you have any difficulty performing the following activities: 05/10/2016 04/30/2016  Hearing? [name]  Vision? Y Y  Difficulty concentrating or making decisions? [name]  Walking or climbing stairs? Y Y  Dressing or bathing? N N  Doing errands, shopping? [name]  Preparing Food and eating ? Y -  Using the Toilet? N -  In the past six months, have you accidently leaked urine? Y -  Do you have problems with loss of bowel control? N -  Managing your Medications? Y -  Managing your Finances? Y -  Housekeeping or managing your Housekeeping? Y -    Fall/Depression Screening: PHQ 2/9 Scores 05/10/2016 03/18/2016 02/20/2016 01/24/2016 04/06/2015  PHQ - 2 Score 0 0 0 0 0   Fall Risk  06/04/2016 05/16/2016 05/10/2016 03/18/2016 02/20/2016  Falls in the past year? Yes No No No No  Number falls in past yr: 1 - - - -  Risk for fall due to : History of fall(s) Impaired balance/gait - - -  Risk for fall due to (comments): - amublates with walker - - -  Follow up Falls prevention discussed;Education provided - - - -  Assessment:  82 year old with recent admission due to hypertensive urgency. Co-visit with Pharmacy resident, [name].  Member reports a recent fall that was evaluated at the emergency room. [name] states she woke up and she thinks she tried to get up out of bed too fast. [name] states she called for help with her life alert necklace. She denies any episodes since then. RNCM discussed possibility of home health physical therapy. Member declines. RNCM observed member ambulating with walker. Gait steady. Falls prevention discussed.  Medications reviewed-See pharmacist note.  Member reports she will  be transitioning to assisted living within the month. Member states she has been to [place] and knows where her room will be. [name] reports her son is making the arrangements for her.  Plan: Follow up telephonically in two-three weeks to see if member has transitioned to assisted living.  Bonnie Silversmith, RN, MSN, [place] Coordinator Cell: [phone]

## 2016-06-04 NOTE — Patient Outreach (Signed)
[place] [facility]) Care Management  [place]   06/04/2016  Apurva Garofalo Asmar 1915/08/30 [id number]  Subjective: [name] is a 82 yo who was referred to [facility] for medication management.  Joint home visit today with [place].  Patient had all medication bottles neatly arranged in a cabinet in her kitchen.  Patient has an aid that comes to her home twice a day.  Her aid assists in filling a weekly pill box for her.  Patient reports adherence with medications.  Patient also states she plans to move to [facility] assisted living facility within the month.    Objective:  Medication review completed using medication list from PCP office, patient's prescription bottles, and patient's pill box.    Encounter Medications: Outpatient Encounter Prescriptions as of 06/04/2016  Medication Sig Note  . amLODipine (NORVASC) 5 MG tablet Take 1 tablet (5 mg total) by mouth daily.   Marland Kitchen aspirin 81 MG tablet Take 81 mg by mouth daily.   . ferrous sulfate 325 (65 FE) MG tablet Take 325 mg by mouth daily with breakfast.   . furosemide (LASIX) 20 MG tablet Take 20 mg by mouth as needed. 06/04/2016: Reports taking every other day   . glimepiride (AMARYL) 2 MG tablet TAKE 1 TABLET BY MOUTH EVERY MORNING AND 1/2 TABLET AT DINNER (Patient taking differently: TAKE 1/2 TABLET BY MOUTH EVERY MORNING AND 1/2 TABLET AT DINNER)   . insulin aspart (NOVOLOG FLEXPEN) 100 UNIT/ML FlexPen If blood sugar is over 200, take 5 units and if they go over 300, take 6 units at that meal. (Patient taking differently: Inject 4-5 Units into the skin daily. )   . LYRICA 75 MG capsule TAKE 1 CAPSULE TWICE DAILY (Patient taking differently: take 75mg  every evening)   . Multiple Vitamins-Minerals (CENTRUM SILVER PO) Take 1 tablet by mouth.   . pantoprazole (PROTONIX) 40 MG tablet TAKE 1 TABLET BY MOUTH TWICE DAILY   . pravastatin (PRAVACHOL) 20 MG tablet Take 20 mg by mouth daily.   .  sennosides-docusate sodium (SENOKOT-S) 8.6-50 MG tablet Take 1 tablet by mouth as needed for constipation.   Marland Kitchen SYNTHROID 88 MCG tablet TAKE 1 TABLET DAILY   . traMADol (ULTRAM) 50 MG tablet Take 1 tablet (50 mg total) by mouth 2 (two) times daily. (Patient taking differently: Take 50 mg by mouth every 12 (twelve) hours as needed for moderate pain. )   . vitamin B-12 (CYANOCOBALAMIN) 1000 MCG tablet Take 1,000 mcg by mouth daily.   . [DISCONTINUED] Calcium Carbonate-Vitamin D (CALTRATE 600+D) 600-400 MG-UNIT per tablet Take 1 tablet by mouth daily. Reported on 06/04/2016   . [DISCONTINUED] hydrochlorothiazide (HYDRODIURIL) 25 MG tablet Take 0.5 tablets (12.5 mg total) by mouth daily. (Patient not taking: Reported on 06/04/2016)    No facility-administered encounter medications on file as of 06/04/2016.   Functional Status: In your present state of health, do you have any difficulty performing the following activities: 05/10/2016 04/30/2016  Hearing? [name]  Vision? Y Y  Difficulty concentrating or making decisions? [name]  Walking or climbing stairs? Y Y  Dressing or bathing? N N  Doing errands, shopping? [name]  Preparing Food and eating ? Y -  Using the Toilet? N -  In the past six months, have you accidently leaked urine? Y -  Do you have problems with loss of bowel control? N -  Managing your Medications? Y -  Managing your Finances? Y -  Housekeeping or managing your Housekeeping? Y -   Fall/Depression Screening: PHQ 2/9 Scores 05/10/2016 03/18/2016 02/20/2016 01/24/2016 04/06/2015  PHQ - 2 Score 0 0 0 0 0   Assessment:  Drugs sorted by system:  Cardiovascular: amlodipine, aspirin, HCTZ (patient not taking), pravastatin  Gastrointestinal: pantoprazole, sennosides-docusate  Endocrine: glimepiride, Novolog, levothyroxine  Pain: pregabalin, tramadol  Vitamins/Minerals: cyanocobalamin, ferrous sulfate, multivitamin   Duplications in therapy: none noted Gaps in therapy: none noted - patient is on  aspirin and statin.  Most recent microalbumin/creatinine ratio was 2.8; therefore, ACE inhibitor or ARB is not indicated.   Medications to avoid in the elderly:  - Pantoprazole (risk of Clostridium difficile infection and bone loss and fractures) - Novolog sliding scale (Higher risk of hypoglycemia without improvement in hyperglycemia management) - patient denies hypoglycemia.  Review of blood glucose readings shows most blood glucose readings are in the 100s.   Drug interactions: none noted Other issues noted:  - Patient is currently taking pravastatin in the morning.  Counseled patient on purpose and proper use, and advised patient to take pravastatin in the evening.   - Patient is not currently taking HCTZ.  Patient states she does not remember taking this medication.  Phone call to [facility] who reports patent has not filled HCTZ prescription since 12/11/2015.  Upon review of patient's chart, HCTZ was initiated during ED visit on 12/11/15.  It appears patient likely picked up initial 30 day supply then stopped taking the medication.  Phone call to Dr. Shon Baton office.  Spoke to [facility] regarding HCTZ prescription.  Informed Melissa that patient's BP today was 154/60, and patient has not been taking HCTZ since ~January 2017.  Dr. Philip Aspen advised patient to continue to hold HCTZ.  Patient is aware.    Plan: 1.  I will send a letter to patient's PCP to consider changing pantoprazole to a H2 blocker such as ranitidine if clinically indicated.  I will also include in my letter recommendation to change Novolog sliding scale to a long-acting insulin such as Lantus to reduce risk of hypoglycemia.   2.  I will close pharmacy program as no further intervention needed.  I will update [facility] CMRN Thea Silversmith.     Elisabeth Most, Pharm.D. Pharmacy Resident [place] [phone]

## 2016-06-05 ENCOUNTER — Encounter: Payer: Self-pay | Admitting: Pharmacist

## 2016-06-07 DIAGNOSIS — R945 Abnormal results of liver function studies: Secondary | ICD-10-CM | POA: Diagnosis not present

## 2016-06-18 ENCOUNTER — Encounter: Payer: Self-pay | Admitting: Podiatry

## 2016-06-18 ENCOUNTER — Ambulatory Visit (INDEPENDENT_AMBULATORY_CARE_PROVIDER_SITE_OTHER): Payer: Commercial Managed Care - HMO | Admitting: Podiatry

## 2016-06-18 DIAGNOSIS — Z1389 Encounter for screening for other disorder: Secondary | ICD-10-CM | POA: Diagnosis not present

## 2016-06-18 DIAGNOSIS — R600 Localized edema: Secondary | ICD-10-CM | POA: Diagnosis not present

## 2016-06-18 DIAGNOSIS — E1149 Type 2 diabetes mellitus with other diabetic neurological complication: Secondary | ICD-10-CM

## 2016-06-18 DIAGNOSIS — Z6824 Body mass index (BMI) 24.0-24.9, adult: Secondary | ICD-10-CM | POA: Diagnosis not present

## 2016-06-18 DIAGNOSIS — M2010 Hallux valgus (acquired), unspecified foot: Secondary | ICD-10-CM | POA: Diagnosis not present

## 2016-06-18 DIAGNOSIS — E1151 Type 2 diabetes mellitus with diabetic peripheral angiopathy without gangrene: Secondary | ICD-10-CM | POA: Diagnosis not present

## 2016-06-18 DIAGNOSIS — I10 Essential (primary) hypertension: Secondary | ICD-10-CM | POA: Diagnosis not present

## 2016-06-18 DIAGNOSIS — R945 Abnormal results of liver function studies: Secondary | ICD-10-CM | POA: Diagnosis not present

## 2016-06-18 DIAGNOSIS — R002 Palpitations: Secondary | ICD-10-CM | POA: Diagnosis not present

## 2016-06-18 DIAGNOSIS — B351 Tinea unguium: Secondary | ICD-10-CM | POA: Diagnosis not present

## 2016-06-18 DIAGNOSIS — D649 Anemia, unspecified: Secondary | ICD-10-CM | POA: Diagnosis not present

## 2016-06-18 DIAGNOSIS — R2681 Unsteadiness on feet: Secondary | ICD-10-CM | POA: Diagnosis not present

## 2016-06-18 DIAGNOSIS — M79676 Pain in unspecified toe(s): Secondary | ICD-10-CM | POA: Diagnosis not present

## 2016-06-18 NOTE — Progress Notes (Signed)
Patient ID: AYZLEE RAIFORD, female   DOB: 22-Sep-1915, 82 y.o.   MRN: [id number] Complaint:  Visit Type: Patient returns to my office for continued preventative foot care services. Complaint: Patient states" my nails have grown long and thick and become painful to walk and wear shoes" Patient has been diagnosed with DM with neuropathy. The patient presents for preventative foot care services. No changes to ROS  Podiatric Exam: Vascular: dorsalis pedis is  palpable bilateral. Posterior tibial pulses are non palpable. Capillary return is immediate. Cold feet.  Sensorium: Diminished  Semmes Weinstein monofilament test. Normal tactile sensation bilaterally. Nail Exam: Pt has thick disfigured discolored nails with subungual debris noted bilateral entire nail hallux through fifth toenails Ulcer Exam: There is no evidence of ulcer or pre-ulcerative changes or infection. Orthopedic Exam: Muscle tone and strength are WNL. No limitations in general ROM. No crepitus or effusions noted. Foot type and digits show no abnormalities. HAV  B/L. Skin:  Porokeratosis dorsomedial exostosis 1st MPJ left foot.. No infection or ulcers  Diagnosis:  Onychomycosis, , Pain in right toe, pain in left toes,  Porokeratosis left foot  Treatment & Plan Procedures and Treatment: Consent by patient was obtained for treatment procedures. The patient understood the discussion of treatment and procedures well. All questions were answered thoroughly reviewed. Debridement of mycotic and hypertrophic toenails, 1 through 5 bilateral and clearing of subungual debris. No ulceration, no infection noted. Debride porokeratosis Return Visit-Office Procedure: Patient instructed to return to the office for a follow up visit 3 months for continued evaluation and treatment.    Gardiner Barefoot DPM

## 2016-06-24 ENCOUNTER — Ambulatory Visit: Payer: Self-pay

## 2016-06-28 ENCOUNTER — Other Ambulatory Visit: Payer: Self-pay

## 2016-06-28 NOTE — Patient Outreach (Signed)
[place] [facility]) Care Management  06/28/2016  [name] Sep 29, 1915 [id number]  RNCM called to follow up regarding any additional care management needs.No answer. HIPPA complaint message left.  Plan: follow up call within 1-2 weeks.  Thea Silversmith, RN, MSN, [place] Coordinator Cell: [phone]

## 2016-07-04 ENCOUNTER — Other Ambulatory Visit: Payer: Self-pay

## 2016-07-04 NOTE — Patient Outreach (Signed)
[facility] [facility]) Care Management  07/04/2016  [name] 06-Jul-1915 [id number]  RNCM called to follow up. RNCM was not able to reach member. RNCM Spoke with member's son, [name], consented and per previous permission from member. Member is now at Assisted living, [facility]. Mr. Nerio reports that member is adjusting, and participating in activities. Mr. Mcneary with no questions or concern. Goals met. RNCM discussed case closure.  Plan: close case.  Thea Silversmith, RN, MSN, [place] Coordinator Cell: [phone]

## 2016-07-10 ENCOUNTER — Other Ambulatory Visit: Payer: Self-pay | Admitting: Endocrinology

## 2016-07-15 DIAGNOSIS — Z6825 Body mass index (BMI) 25.0-25.9, adult: Secondary | ICD-10-CM | POA: Diagnosis not present

## 2016-07-15 DIAGNOSIS — I10 Essential (primary) hypertension: Secondary | ICD-10-CM | POA: Diagnosis not present

## 2016-07-15 DIAGNOSIS — R600 Localized edema: Secondary | ICD-10-CM | POA: Diagnosis not present

## 2016-07-15 DIAGNOSIS — R2681 Unsteadiness on feet: Secondary | ICD-10-CM | POA: Diagnosis not present

## 2016-07-15 DIAGNOSIS — E1151 Type 2 diabetes mellitus with diabetic peripheral angiopathy without gangrene: Secondary | ICD-10-CM | POA: Diagnosis not present

## 2016-07-15 DIAGNOSIS — I7389 Other specified peripheral vascular diseases: Secondary | ICD-10-CM | POA: Diagnosis not present

## 2016-08-28 DIAGNOSIS — J209 Acute bronchitis, unspecified: Secondary | ICD-10-CM | POA: Diagnosis not present

## 2016-08-28 DIAGNOSIS — R0989 Other specified symptoms and signs involving the circulatory and respiratory systems: Secondary | ICD-10-CM | POA: Diagnosis not present

## 2016-08-28 DIAGNOSIS — Z6825 Body mass index (BMI) 25.0-25.9, adult: Secondary | ICD-10-CM | POA: Diagnosis not present

## 2016-09-17 ENCOUNTER — Ambulatory Visit (INDEPENDENT_AMBULATORY_CARE_PROVIDER_SITE_OTHER): Payer: Commercial Managed Care - HMO | Admitting: Podiatry

## 2016-09-17 ENCOUNTER — Encounter: Payer: Self-pay | Admitting: Podiatry

## 2016-09-17 VITALS — BP 181/66 | HR 75 | Resp 12

## 2016-09-17 DIAGNOSIS — B351 Tinea unguium: Secondary | ICD-10-CM

## 2016-09-17 DIAGNOSIS — L84 Corns and callosities: Secondary | ICD-10-CM

## 2016-09-17 DIAGNOSIS — M2010 Hallux valgus (acquired), unspecified foot: Secondary | ICD-10-CM

## 2016-09-17 DIAGNOSIS — E1149 Type 2 diabetes mellitus with other diabetic neurological complication: Secondary | ICD-10-CM

## 2016-09-17 DIAGNOSIS — M79676 Pain in unspecified toe(s): Secondary | ICD-10-CM | POA: Diagnosis not present

## 2016-09-17 NOTE — Progress Notes (Signed)
Patient ID: [name], female   DOB: [birth year], 82 y.o.   MRN: [id number] Complaint:  Visit Type: Patient returns to my office for continued preventative foot care services. Complaint: Patient states" my nails have grown long and thick and become painful to walk and wear shoes" Patient has been diagnosed with DM with neuropathy. The patient presents for preventative foot care services. No changes to ROS  Podiatric Exam: Vascular: dorsalis pedis is  palpable bilateral. Posterior tibial pulses are non palpable. Capillary return is immediate. Cold feet.  Sensorium: Diminished  Semmes Weinstein monofilament test. Normal tactile sensation bilaterally. Nail Exam: Pt has thick disfigured discolored nails with subungual debris noted bilateral entire nail hallux through fifth toenails Ulcer Exam: There is no evidence of ulcer or pre-ulcerative changes or infection. Orthopedic Exam: Muscle tone and strength are WNL. No limitations in general ROM. No crepitus or effusions noted. Foot type and digits show no abnormalities. HAV  B/L. Skin:  Porokeratosis dorsomedial exostosis 1st MPJ left foot.. No infection or ulcers  Diagnosis:  Onychomycosis, , Pain in right toe, pain in left toes,  Porokeratosis left foot  Treatment & Plan Procedures and Treatment: Consent by patient was obtained for treatment procedures. The patient understood the discussion of treatment and procedures well. All questions were answered thoroughly reviewed. Debridement of mycotic and hypertrophic toenails, 1 through 5 bilateral and clearing of subungual debris. No ulceration, no infection noted. Debride porokeratosis.  Dispense bunion pad. Return Visit-Office Procedure: Patient instructed to return to the office for a follow up visit 3 months for continued evaluation and treatment.    Gardiner Barefoot DPM

## 2016-09-20 DIAGNOSIS — I7389 Other specified peripheral vascular diseases: Secondary | ICD-10-CM | POA: Diagnosis not present

## 2016-09-20 DIAGNOSIS — E1151 Type 2 diabetes mellitus with diabetic peripheral angiopathy without gangrene: Secondary | ICD-10-CM | POA: Diagnosis not present

## 2016-09-20 DIAGNOSIS — Z6825 Body mass index (BMI) 25.0-25.9, adult: Secondary | ICD-10-CM | POA: Diagnosis not present

## 2016-09-20 DIAGNOSIS — R2681 Unsteadiness on feet: Secondary | ICD-10-CM | POA: Diagnosis not present

## 2016-09-20 DIAGNOSIS — I10 Essential (primary) hypertension: Secondary | ICD-10-CM | POA: Diagnosis not present

## 2016-11-01 DIAGNOSIS — R32 Unspecified urinary incontinence: Secondary | ICD-10-CM | POA: Diagnosis not present

## 2016-11-01 DIAGNOSIS — I10 Essential (primary) hypertension: Secondary | ICD-10-CM | POA: Diagnosis not present

## 2016-11-01 DIAGNOSIS — Z6825 Body mass index (BMI) 25.0-25.9, adult: Secondary | ICD-10-CM | POA: Diagnosis not present

## 2016-11-01 DIAGNOSIS — R002 Palpitations: Secondary | ICD-10-CM | POA: Diagnosis not present

## 2016-11-01 DIAGNOSIS — R600 Localized edema: Secondary | ICD-10-CM | POA: Diagnosis not present

## 2016-12-10 DIAGNOSIS — N183 Chronic kidney disease, stage 3 (moderate): Secondary | ICD-10-CM | POA: Diagnosis not present

## 2016-12-10 DIAGNOSIS — R600 Localized edema: Secondary | ICD-10-CM | POA: Diagnosis not present

## 2016-12-10 DIAGNOSIS — E1151 Type 2 diabetes mellitus with diabetic peripheral angiopathy without gangrene: Secondary | ICD-10-CM | POA: Diagnosis not present

## 2016-12-10 DIAGNOSIS — I10 Essential (primary) hypertension: Secondary | ICD-10-CM | POA: Diagnosis not present

## 2016-12-10 DIAGNOSIS — Z6825 Body mass index (BMI) 25.0-25.9, adult: Secondary | ICD-10-CM | POA: Diagnosis not present

## 2016-12-10 DIAGNOSIS — R32 Unspecified urinary incontinence: Secondary | ICD-10-CM | POA: Diagnosis not present

## 2016-12-17 ENCOUNTER — Ambulatory Visit (INDEPENDENT_AMBULATORY_CARE_PROVIDER_SITE_OTHER): Payer: Commercial Managed Care - HMO | Admitting: Podiatry

## 2016-12-17 ENCOUNTER — Encounter: Payer: Self-pay | Admitting: Podiatry

## 2016-12-17 DIAGNOSIS — B351 Tinea unguium: Secondary | ICD-10-CM | POA: Diagnosis not present

## 2016-12-17 DIAGNOSIS — M79676 Pain in unspecified toe(s): Secondary | ICD-10-CM | POA: Diagnosis not present

## 2016-12-17 DIAGNOSIS — L84 Corns and callosities: Secondary | ICD-10-CM | POA: Insufficient documentation

## 2016-12-17 NOTE — Progress Notes (Addendum)
Patient ID: [name], female   DOB: 04/21/1915, 82 y.o.   MRN: [id number] Complaint:  Visit Type: Patient returns to my office for continued preventative foot care services. Complaint: Patient states" my nails have grown long and thick and become painful to walk and wear shoes" Patient has been diagnosed with DM with neuropathy. The patient presents for preventative foot care services. No changes to ROS  Podiatric Exam: Vascular: dorsalis pedis is  palpable bilateral. Posterior tibial pulses are non palpable. Capillary return is immediate. Cold feet.  Sensorium: Diminished  Semmes Weinstein monofilament test. Normal tactile sensation bilaterally. Nail Exam: Pt has thick disfigured discolored nails with subungual debris noted bilateral entire nail hallux through fifth toenails Ulcer Exam: There is no evidence of ulcer or pre-ulcerative changes or infection. Orthopedic Exam: Muscle tone and strength are WNL. No limitations in general ROM. No crepitus or effusions noted. Foot type and digits show no abnormalities. HAV  B/L. Skin:  Porokeratosis dorsomedial exostosis 1st MPJ left foot.. No infection or ulcers  Diagnosis:  Onychomycosis, , Pain in right toe, pain in left toes,  Porokeratosis left foot  Treatment & Plan Procedures and Treatment: Consent by patient was obtained for treatment procedures. The patient understood the discussion of treatment and procedures well. All questions were answered thoroughly reviewed. Debridement of mycotic and hypertrophic toenails, 1 through 5 bilateral and clearing of subungual debris. No ulceration, no infection noted. Debride porokeratosis. Upon leaving patient was bleeding from her dorsomedial porokeratosis.  She was bandaged with neosporin/DSD and given surgical shoe since her shoe was too small.   Return Visit-Office Procedure: Patient instructed to return to the office for a follow up visit 3 months for continued evaluation and treatment. Call with  questions concerning the bleeding.    Gardiner Barefoot DPM

## 2017-01-10 DIAGNOSIS — E1151 Type 2 diabetes mellitus with diabetic peripheral angiopathy without gangrene: Secondary | ICD-10-CM | POA: Diagnosis not present

## 2017-01-10 DIAGNOSIS — Z1389 Encounter for screening for other disorder: Secondary | ICD-10-CM | POA: Diagnosis not present

## 2017-01-10 DIAGNOSIS — I10 Essential (primary) hypertension: Secondary | ICD-10-CM | POA: Diagnosis not present

## 2017-01-10 DIAGNOSIS — R2681 Unsteadiness on feet: Secondary | ICD-10-CM | POA: Diagnosis not present

## 2017-01-10 DIAGNOSIS — Z6826 Body mass index (BMI) 26.0-26.9, adult: Secondary | ICD-10-CM | POA: Diagnosis not present

## 2017-03-18 ENCOUNTER — Ambulatory Visit (INDEPENDENT_AMBULATORY_CARE_PROVIDER_SITE_OTHER): Payer: Medicare HMO | Admitting: Podiatry

## 2017-03-18 ENCOUNTER — Encounter: Payer: Self-pay | Admitting: Podiatry

## 2017-03-18 DIAGNOSIS — M79676 Pain in unspecified toe(s): Secondary | ICD-10-CM

## 2017-03-18 DIAGNOSIS — B351 Tinea unguium: Secondary | ICD-10-CM

## 2017-03-18 DIAGNOSIS — E1149 Type 2 diabetes mellitus with other diabetic neurological complication: Secondary | ICD-10-CM

## 2017-03-18 NOTE — Progress Notes (Signed)
Patient ID: [name], female   DOB: 11/25/1915, 83 y.o.   MRN: [id number] Complaint:  Visit Type: Patient returns to my office for continued preventative foot care services. Complaint: Patient states" my nails have grown long and thick and become painful to walk and wear shoes" Patient has been diagnosed with DM with neuropathy. The patient presents for preventative foot care services. No changes to ROS  Podiatric Exam: Vascular: dorsalis pedis is  palpable bilateral. Posterior tibial pulses are non palpable. Capillary return is immediate. Cold feet.  Sensorium: Diminished  Semmes Weinstein monofilament test. Normal tactile sensation bilaterally. Nail Exam: Pt has thick disfigured discolored nails with subungual debris noted bilateral entire nail hallux through fifth toenails Ulcer Exam: There is no evidence of ulcer or pre-ulcerative changes or infection. Orthopedic Exam: Muscle tone and strength are WNL. No limitations in general ROM. No crepitus or effusions noted. Foot type and digits show no abnormalities. HAV  B/L. Skin:  Porokeratosis dorsomedial exostosis 1st MPJ left foot.. No infection or ulcers  Diagnosis:  Onychomycosis, , Pain in right toe, pain in left toes,  Porokeratosis left foot  Treatment & Plan Procedures and Treatment: Consent by patient was obtained for treatment procedures. The patient understood the discussion of treatment and procedures well. All questions were answered thoroughly reviewed. Debridement of mycotic and hypertrophic toenails, 1 through 5 bilateral and clearing of subungual debris. No ulceration, no infection noted. Debride porokeratosis Return Visit-Office Procedure: Patient instructed to return to the office for a follow up visit 3 months for continued evaluation and treatment.    Gardiner Barefoot DPM

## 2017-03-24 DIAGNOSIS — E1142 Type 2 diabetes mellitus with diabetic polyneuropathy: Secondary | ICD-10-CM | POA: Diagnosis not present

## 2017-03-24 DIAGNOSIS — E1151 Type 2 diabetes mellitus with diabetic peripheral angiopathy without gangrene: Secondary | ICD-10-CM | POA: Diagnosis not present

## 2017-03-24 DIAGNOSIS — N183 Chronic kidney disease, stage 3 (moderate): Secondary | ICD-10-CM | POA: Diagnosis not present

## 2017-03-24 DIAGNOSIS — G8929 Other chronic pain: Secondary | ICD-10-CM | POA: Diagnosis not present

## 2017-03-24 DIAGNOSIS — I872 Venous insufficiency (chronic) (peripheral): Secondary | ICD-10-CM | POA: Diagnosis not present

## 2017-03-24 DIAGNOSIS — G2581 Restless legs syndrome: Secondary | ICD-10-CM | POA: Diagnosis not present

## 2017-03-24 DIAGNOSIS — M5136 Other intervertebral disc degeneration, lumbar region: Secondary | ICD-10-CM | POA: Diagnosis not present

## 2017-03-24 DIAGNOSIS — M1990 Unspecified osteoarthritis, unspecified site: Secondary | ICD-10-CM | POA: Diagnosis not present

## 2017-03-24 DIAGNOSIS — I129 Hypertensive chronic kidney disease with stage 1 through stage 4 chronic kidney disease, or unspecified chronic kidney disease: Secondary | ICD-10-CM | POA: Diagnosis not present

## 2017-03-25 DIAGNOSIS — E1142 Type 2 diabetes mellitus with diabetic polyneuropathy: Secondary | ICD-10-CM | POA: Diagnosis not present

## 2017-03-25 DIAGNOSIS — G8929 Other chronic pain: Secondary | ICD-10-CM | POA: Diagnosis not present

## 2017-03-25 DIAGNOSIS — M1990 Unspecified osteoarthritis, unspecified site: Secondary | ICD-10-CM | POA: Diagnosis not present

## 2017-03-25 DIAGNOSIS — E1151 Type 2 diabetes mellitus with diabetic peripheral angiopathy without gangrene: Secondary | ICD-10-CM | POA: Diagnosis not present

## 2017-03-25 DIAGNOSIS — I129 Hypertensive chronic kidney disease with stage 1 through stage 4 chronic kidney disease, or unspecified chronic kidney disease: Secondary | ICD-10-CM | POA: Diagnosis not present

## 2017-03-25 DIAGNOSIS — M5136 Other intervertebral disc degeneration, lumbar region: Secondary | ICD-10-CM | POA: Diagnosis not present

## 2017-03-28 DIAGNOSIS — M5136 Other intervertebral disc degeneration, lumbar region: Secondary | ICD-10-CM | POA: Diagnosis not present

## 2017-03-28 DIAGNOSIS — E1142 Type 2 diabetes mellitus with diabetic polyneuropathy: Secondary | ICD-10-CM | POA: Diagnosis not present

## 2017-03-28 DIAGNOSIS — G8929 Other chronic pain: Secondary | ICD-10-CM | POA: Diagnosis not present

## 2017-03-28 DIAGNOSIS — I129 Hypertensive chronic kidney disease with stage 1 through stage 4 chronic kidney disease, or unspecified chronic kidney disease: Secondary | ICD-10-CM | POA: Diagnosis not present

## 2017-03-28 DIAGNOSIS — M1990 Unspecified osteoarthritis, unspecified site: Secondary | ICD-10-CM | POA: Diagnosis not present

## 2017-03-28 DIAGNOSIS — E1151 Type 2 diabetes mellitus with diabetic peripheral angiopathy without gangrene: Secondary | ICD-10-CM | POA: Diagnosis not present

## 2017-03-31 DIAGNOSIS — E1151 Type 2 diabetes mellitus with diabetic peripheral angiopathy without gangrene: Secondary | ICD-10-CM | POA: Diagnosis not present

## 2017-03-31 DIAGNOSIS — I129 Hypertensive chronic kidney disease with stage 1 through stage 4 chronic kidney disease, or unspecified chronic kidney disease: Secondary | ICD-10-CM | POA: Diagnosis not present

## 2017-03-31 DIAGNOSIS — G8929 Other chronic pain: Secondary | ICD-10-CM | POA: Diagnosis not present

## 2017-03-31 DIAGNOSIS — M1990 Unspecified osteoarthritis, unspecified site: Secondary | ICD-10-CM | POA: Diagnosis not present

## 2017-03-31 DIAGNOSIS — M5136 Other intervertebral disc degeneration, lumbar region: Secondary | ICD-10-CM | POA: Diagnosis not present

## 2017-03-31 DIAGNOSIS — E1142 Type 2 diabetes mellitus with diabetic polyneuropathy: Secondary | ICD-10-CM | POA: Diagnosis not present

## 2017-04-02 DIAGNOSIS — I129 Hypertensive chronic kidney disease with stage 1 through stage 4 chronic kidney disease, or unspecified chronic kidney disease: Secondary | ICD-10-CM | POA: Diagnosis not present

## 2017-04-02 DIAGNOSIS — M1990 Unspecified osteoarthritis, unspecified site: Secondary | ICD-10-CM | POA: Diagnosis not present

## 2017-04-02 DIAGNOSIS — M5136 Other intervertebral disc degeneration, lumbar region: Secondary | ICD-10-CM | POA: Diagnosis not present

## 2017-04-02 DIAGNOSIS — E1142 Type 2 diabetes mellitus with diabetic polyneuropathy: Secondary | ICD-10-CM | POA: Diagnosis not present

## 2017-04-02 DIAGNOSIS — G8929 Other chronic pain: Secondary | ICD-10-CM | POA: Diagnosis not present

## 2017-04-02 DIAGNOSIS — E1151 Type 2 diabetes mellitus with diabetic peripheral angiopathy without gangrene: Secondary | ICD-10-CM | POA: Diagnosis not present

## 2017-04-03 DIAGNOSIS — M5136 Other intervertebral disc degeneration, lumbar region: Secondary | ICD-10-CM | POA: Diagnosis not present

## 2017-04-03 DIAGNOSIS — G8929 Other chronic pain: Secondary | ICD-10-CM | POA: Diagnosis not present

## 2017-04-03 DIAGNOSIS — E1142 Type 2 diabetes mellitus with diabetic polyneuropathy: Secondary | ICD-10-CM | POA: Diagnosis not present

## 2017-04-03 DIAGNOSIS — E1151 Type 2 diabetes mellitus with diabetic peripheral angiopathy without gangrene: Secondary | ICD-10-CM | POA: Diagnosis not present

## 2017-04-03 DIAGNOSIS — I129 Hypertensive chronic kidney disease with stage 1 through stage 4 chronic kidney disease, or unspecified chronic kidney disease: Secondary | ICD-10-CM | POA: Diagnosis not present

## 2017-04-03 DIAGNOSIS — M1990 Unspecified osteoarthritis, unspecified site: Secondary | ICD-10-CM | POA: Diagnosis not present

## 2017-04-04 DIAGNOSIS — M1990 Unspecified osteoarthritis, unspecified site: Secondary | ICD-10-CM | POA: Diagnosis not present

## 2017-04-04 DIAGNOSIS — M5136 Other intervertebral disc degeneration, lumbar region: Secondary | ICD-10-CM | POA: Diagnosis not present

## 2017-04-04 DIAGNOSIS — E1142 Type 2 diabetes mellitus with diabetic polyneuropathy: Secondary | ICD-10-CM | POA: Diagnosis not present

## 2017-04-04 DIAGNOSIS — E1151 Type 2 diabetes mellitus with diabetic peripheral angiopathy without gangrene: Secondary | ICD-10-CM | POA: Diagnosis not present

## 2017-04-04 DIAGNOSIS — I129 Hypertensive chronic kidney disease with stage 1 through stage 4 chronic kidney disease, or unspecified chronic kidney disease: Secondary | ICD-10-CM | POA: Diagnosis not present

## 2017-04-04 DIAGNOSIS — G8929 Other chronic pain: Secondary | ICD-10-CM | POA: Diagnosis not present

## 2017-04-07 DIAGNOSIS — M5136 Other intervertebral disc degeneration, lumbar region: Secondary | ICD-10-CM | POA: Diagnosis not present

## 2017-04-07 DIAGNOSIS — E1142 Type 2 diabetes mellitus with diabetic polyneuropathy: Secondary | ICD-10-CM | POA: Diagnosis not present

## 2017-04-07 DIAGNOSIS — I129 Hypertensive chronic kidney disease with stage 1 through stage 4 chronic kidney disease, or unspecified chronic kidney disease: Secondary | ICD-10-CM | POA: Diagnosis not present

## 2017-04-07 DIAGNOSIS — M1990 Unspecified osteoarthritis, unspecified site: Secondary | ICD-10-CM | POA: Diagnosis not present

## 2017-04-07 DIAGNOSIS — E1151 Type 2 diabetes mellitus with diabetic peripheral angiopathy without gangrene: Secondary | ICD-10-CM | POA: Diagnosis not present

## 2017-04-07 DIAGNOSIS — G8929 Other chronic pain: Secondary | ICD-10-CM | POA: Diagnosis not present

## 2017-04-08 DIAGNOSIS — M1990 Unspecified osteoarthritis, unspecified site: Secondary | ICD-10-CM | POA: Diagnosis not present

## 2017-04-08 DIAGNOSIS — M5136 Other intervertebral disc degeneration, lumbar region: Secondary | ICD-10-CM | POA: Diagnosis not present

## 2017-04-08 DIAGNOSIS — E1142 Type 2 diabetes mellitus with diabetic polyneuropathy: Secondary | ICD-10-CM | POA: Diagnosis not present

## 2017-04-08 DIAGNOSIS — G8929 Other chronic pain: Secondary | ICD-10-CM | POA: Diagnosis not present

## 2017-04-08 DIAGNOSIS — E1151 Type 2 diabetes mellitus with diabetic peripheral angiopathy without gangrene: Secondary | ICD-10-CM | POA: Diagnosis not present

## 2017-04-08 DIAGNOSIS — I129 Hypertensive chronic kidney disease with stage 1 through stage 4 chronic kidney disease, or unspecified chronic kidney disease: Secondary | ICD-10-CM | POA: Diagnosis not present

## 2017-04-09 DIAGNOSIS — G8929 Other chronic pain: Secondary | ICD-10-CM | POA: Diagnosis not present

## 2017-04-09 DIAGNOSIS — M5136 Other intervertebral disc degeneration, lumbar region: Secondary | ICD-10-CM | POA: Diagnosis not present

## 2017-04-09 DIAGNOSIS — E1151 Type 2 diabetes mellitus with diabetic peripheral angiopathy without gangrene: Secondary | ICD-10-CM | POA: Diagnosis not present

## 2017-04-09 DIAGNOSIS — I129 Hypertensive chronic kidney disease with stage 1 through stage 4 chronic kidney disease, or unspecified chronic kidney disease: Secondary | ICD-10-CM | POA: Diagnosis not present

## 2017-04-09 DIAGNOSIS — M1990 Unspecified osteoarthritis, unspecified site: Secondary | ICD-10-CM | POA: Diagnosis not present

## 2017-04-09 DIAGNOSIS — E1142 Type 2 diabetes mellitus with diabetic polyneuropathy: Secondary | ICD-10-CM | POA: Diagnosis not present

## 2017-04-10 DIAGNOSIS — M5136 Other intervertebral disc degeneration, lumbar region: Secondary | ICD-10-CM | POA: Diagnosis not present

## 2017-04-10 DIAGNOSIS — E1142 Type 2 diabetes mellitus with diabetic polyneuropathy: Secondary | ICD-10-CM | POA: Diagnosis not present

## 2017-04-10 DIAGNOSIS — E1151 Type 2 diabetes mellitus with diabetic peripheral angiopathy without gangrene: Secondary | ICD-10-CM | POA: Diagnosis not present

## 2017-04-10 DIAGNOSIS — M1990 Unspecified osteoarthritis, unspecified site: Secondary | ICD-10-CM | POA: Diagnosis not present

## 2017-04-10 DIAGNOSIS — I129 Hypertensive chronic kidney disease with stage 1 through stage 4 chronic kidney disease, or unspecified chronic kidney disease: Secondary | ICD-10-CM | POA: Diagnosis not present

## 2017-04-10 DIAGNOSIS — G8929 Other chronic pain: Secondary | ICD-10-CM | POA: Diagnosis not present

## 2017-04-14 DIAGNOSIS — M1990 Unspecified osteoarthritis, unspecified site: Secondary | ICD-10-CM | POA: Diagnosis not present

## 2017-04-14 DIAGNOSIS — E1142 Type 2 diabetes mellitus with diabetic polyneuropathy: Secondary | ICD-10-CM | POA: Diagnosis not present

## 2017-04-14 DIAGNOSIS — G8929 Other chronic pain: Secondary | ICD-10-CM | POA: Diagnosis not present

## 2017-04-14 DIAGNOSIS — E1151 Type 2 diabetes mellitus with diabetic peripheral angiopathy without gangrene: Secondary | ICD-10-CM | POA: Diagnosis not present

## 2017-04-14 DIAGNOSIS — I129 Hypertensive chronic kidney disease with stage 1 through stage 4 chronic kidney disease, or unspecified chronic kidney disease: Secondary | ICD-10-CM | POA: Diagnosis not present

## 2017-04-14 DIAGNOSIS — M5136 Other intervertebral disc degeneration, lumbar region: Secondary | ICD-10-CM | POA: Diagnosis not present

## 2017-04-15 DIAGNOSIS — E1140 Type 2 diabetes mellitus with diabetic neuropathy, unspecified: Secondary | ICD-10-CM | POA: Diagnosis not present

## 2017-04-15 DIAGNOSIS — R2681 Unsteadiness on feet: Secondary | ICD-10-CM | POA: Diagnosis not present

## 2017-04-15 DIAGNOSIS — Z6826 Body mass index (BMI) 26.0-26.9, adult: Secondary | ICD-10-CM | POA: Diagnosis not present

## 2017-04-15 DIAGNOSIS — M545 Low back pain: Secondary | ICD-10-CM | POA: Diagnosis not present

## 2017-04-15 DIAGNOSIS — M1990 Unspecified osteoarthritis, unspecified site: Secondary | ICD-10-CM | POA: Diagnosis not present

## 2017-04-16 DIAGNOSIS — E1142 Type 2 diabetes mellitus with diabetic polyneuropathy: Secondary | ICD-10-CM | POA: Diagnosis not present

## 2017-04-16 DIAGNOSIS — G8929 Other chronic pain: Secondary | ICD-10-CM | POA: Diagnosis not present

## 2017-04-16 DIAGNOSIS — M5136 Other intervertebral disc degeneration, lumbar region: Secondary | ICD-10-CM | POA: Diagnosis not present

## 2017-04-16 DIAGNOSIS — E1151 Type 2 diabetes mellitus with diabetic peripheral angiopathy without gangrene: Secondary | ICD-10-CM | POA: Diagnosis not present

## 2017-04-16 DIAGNOSIS — M1990 Unspecified osteoarthritis, unspecified site: Secondary | ICD-10-CM | POA: Diagnosis not present

## 2017-04-16 DIAGNOSIS — I129 Hypertensive chronic kidney disease with stage 1 through stage 4 chronic kidney disease, or unspecified chronic kidney disease: Secondary | ICD-10-CM | POA: Diagnosis not present

## 2017-04-21 DIAGNOSIS — I129 Hypertensive chronic kidney disease with stage 1 through stage 4 chronic kidney disease, or unspecified chronic kidney disease: Secondary | ICD-10-CM | POA: Diagnosis not present

## 2017-04-21 DIAGNOSIS — E1142 Type 2 diabetes mellitus with diabetic polyneuropathy: Secondary | ICD-10-CM | POA: Diagnosis not present

## 2017-04-21 DIAGNOSIS — E1151 Type 2 diabetes mellitus with diabetic peripheral angiopathy without gangrene: Secondary | ICD-10-CM | POA: Diagnosis not present

## 2017-04-21 DIAGNOSIS — G8929 Other chronic pain: Secondary | ICD-10-CM | POA: Diagnosis not present

## 2017-04-21 DIAGNOSIS — M5136 Other intervertebral disc degeneration, lumbar region: Secondary | ICD-10-CM | POA: Diagnosis not present

## 2017-04-21 DIAGNOSIS — M1990 Unspecified osteoarthritis, unspecified site: Secondary | ICD-10-CM | POA: Diagnosis not present

## 2017-04-24 DIAGNOSIS — I129 Hypertensive chronic kidney disease with stage 1 through stage 4 chronic kidney disease, or unspecified chronic kidney disease: Secondary | ICD-10-CM | POA: Diagnosis not present

## 2017-04-24 DIAGNOSIS — E1142 Type 2 diabetes mellitus with diabetic polyneuropathy: Secondary | ICD-10-CM | POA: Diagnosis not present

## 2017-04-24 DIAGNOSIS — E1151 Type 2 diabetes mellitus with diabetic peripheral angiopathy without gangrene: Secondary | ICD-10-CM | POA: Diagnosis not present

## 2017-04-24 DIAGNOSIS — M5136 Other intervertebral disc degeneration, lumbar region: Secondary | ICD-10-CM | POA: Diagnosis not present

## 2017-04-24 DIAGNOSIS — M1990 Unspecified osteoarthritis, unspecified site: Secondary | ICD-10-CM | POA: Diagnosis not present

## 2017-04-24 DIAGNOSIS — G8929 Other chronic pain: Secondary | ICD-10-CM | POA: Diagnosis not present

## 2017-04-29 DIAGNOSIS — R2681 Unsteadiness on feet: Secondary | ICD-10-CM | POA: Diagnosis not present

## 2017-04-29 DIAGNOSIS — R600 Localized edema: Secondary | ICD-10-CM | POA: Diagnosis not present

## 2017-04-29 DIAGNOSIS — Z6824 Body mass index (BMI) 24.0-24.9, adult: Secondary | ICD-10-CM | POA: Diagnosis not present

## 2017-04-29 DIAGNOSIS — E1151 Type 2 diabetes mellitus with diabetic peripheral angiopathy without gangrene: Secondary | ICD-10-CM | POA: Diagnosis not present

## 2017-04-29 DIAGNOSIS — M1990 Unspecified osteoarthritis, unspecified site: Secondary | ICD-10-CM | POA: Diagnosis not present

## 2017-04-29 DIAGNOSIS — E1142 Type 2 diabetes mellitus with diabetic polyneuropathy: Secondary | ICD-10-CM | POA: Diagnosis not present

## 2017-04-29 DIAGNOSIS — G8929 Other chronic pain: Secondary | ICD-10-CM | POA: Diagnosis not present

## 2017-04-29 DIAGNOSIS — I10 Essential (primary) hypertension: Secondary | ICD-10-CM | POA: Diagnosis not present

## 2017-04-29 DIAGNOSIS — M5136 Other intervertebral disc degeneration, lumbar region: Secondary | ICD-10-CM | POA: Diagnosis not present

## 2017-04-29 DIAGNOSIS — R32 Unspecified urinary incontinence: Secondary | ICD-10-CM | POA: Diagnosis not present

## 2017-04-29 DIAGNOSIS — E1140 Type 2 diabetes mellitus with diabetic neuropathy, unspecified: Secondary | ICD-10-CM | POA: Diagnosis not present

## 2017-04-29 DIAGNOSIS — I129 Hypertensive chronic kidney disease with stage 1 through stage 4 chronic kidney disease, or unspecified chronic kidney disease: Secondary | ICD-10-CM | POA: Diagnosis not present

## 2017-05-02 DIAGNOSIS — I129 Hypertensive chronic kidney disease with stage 1 through stage 4 chronic kidney disease, or unspecified chronic kidney disease: Secondary | ICD-10-CM | POA: Diagnosis not present

## 2017-05-02 DIAGNOSIS — G8929 Other chronic pain: Secondary | ICD-10-CM | POA: Diagnosis not present

## 2017-05-02 DIAGNOSIS — E1142 Type 2 diabetes mellitus with diabetic polyneuropathy: Secondary | ICD-10-CM | POA: Diagnosis not present

## 2017-05-02 DIAGNOSIS — M5136 Other intervertebral disc degeneration, lumbar region: Secondary | ICD-10-CM | POA: Diagnosis not present

## 2017-05-02 DIAGNOSIS — M1990 Unspecified osteoarthritis, unspecified site: Secondary | ICD-10-CM | POA: Diagnosis not present

## 2017-05-02 DIAGNOSIS — E1151 Type 2 diabetes mellitus with diabetic peripheral angiopathy without gangrene: Secondary | ICD-10-CM | POA: Diagnosis not present

## 2017-05-06 DIAGNOSIS — G8929 Other chronic pain: Secondary | ICD-10-CM | POA: Diagnosis not present

## 2017-05-06 DIAGNOSIS — M1990 Unspecified osteoarthritis, unspecified site: Secondary | ICD-10-CM | POA: Diagnosis not present

## 2017-05-06 DIAGNOSIS — E1142 Type 2 diabetes mellitus with diabetic polyneuropathy: Secondary | ICD-10-CM | POA: Diagnosis not present

## 2017-05-06 DIAGNOSIS — E1151 Type 2 diabetes mellitus with diabetic peripheral angiopathy without gangrene: Secondary | ICD-10-CM | POA: Diagnosis not present

## 2017-05-06 DIAGNOSIS — M5136 Other intervertebral disc degeneration, lumbar region: Secondary | ICD-10-CM | POA: Diagnosis not present

## 2017-05-06 DIAGNOSIS — I129 Hypertensive chronic kidney disease with stage 1 through stage 4 chronic kidney disease, or unspecified chronic kidney disease: Secondary | ICD-10-CM | POA: Diagnosis not present

## 2017-05-08 DIAGNOSIS — M1990 Unspecified osteoarthritis, unspecified site: Secondary | ICD-10-CM | POA: Diagnosis not present

## 2017-05-08 DIAGNOSIS — G8929 Other chronic pain: Secondary | ICD-10-CM | POA: Diagnosis not present

## 2017-05-08 DIAGNOSIS — I129 Hypertensive chronic kidney disease with stage 1 through stage 4 chronic kidney disease, or unspecified chronic kidney disease: Secondary | ICD-10-CM | POA: Diagnosis not present

## 2017-05-08 DIAGNOSIS — E1151 Type 2 diabetes mellitus with diabetic peripheral angiopathy without gangrene: Secondary | ICD-10-CM | POA: Diagnosis not present

## 2017-05-08 DIAGNOSIS — E1142 Type 2 diabetes mellitus with diabetic polyneuropathy: Secondary | ICD-10-CM | POA: Diagnosis not present

## 2017-05-08 DIAGNOSIS — M5136 Other intervertebral disc degeneration, lumbar region: Secondary | ICD-10-CM | POA: Diagnosis not present

## 2017-05-13 DIAGNOSIS — M5136 Other intervertebral disc degeneration, lumbar region: Secondary | ICD-10-CM | POA: Diagnosis not present

## 2017-05-13 DIAGNOSIS — E1151 Type 2 diabetes mellitus with diabetic peripheral angiopathy without gangrene: Secondary | ICD-10-CM | POA: Diagnosis not present

## 2017-05-13 DIAGNOSIS — E1142 Type 2 diabetes mellitus with diabetic polyneuropathy: Secondary | ICD-10-CM | POA: Diagnosis not present

## 2017-05-13 DIAGNOSIS — I129 Hypertensive chronic kidney disease with stage 1 through stage 4 chronic kidney disease, or unspecified chronic kidney disease: Secondary | ICD-10-CM | POA: Diagnosis not present

## 2017-05-13 DIAGNOSIS — M1990 Unspecified osteoarthritis, unspecified site: Secondary | ICD-10-CM | POA: Diagnosis not present

## 2017-05-13 DIAGNOSIS — G8929 Other chronic pain: Secondary | ICD-10-CM | POA: Diagnosis not present

## 2017-05-15 DIAGNOSIS — E1151 Type 2 diabetes mellitus with diabetic peripheral angiopathy without gangrene: Secondary | ICD-10-CM | POA: Diagnosis not present

## 2017-05-15 DIAGNOSIS — I129 Hypertensive chronic kidney disease with stage 1 through stage 4 chronic kidney disease, or unspecified chronic kidney disease: Secondary | ICD-10-CM | POA: Diagnosis not present

## 2017-05-15 DIAGNOSIS — G8929 Other chronic pain: Secondary | ICD-10-CM | POA: Diagnosis not present

## 2017-05-15 DIAGNOSIS — E1142 Type 2 diabetes mellitus with diabetic polyneuropathy: Secondary | ICD-10-CM | POA: Diagnosis not present

## 2017-05-15 DIAGNOSIS — M1990 Unspecified osteoarthritis, unspecified site: Secondary | ICD-10-CM | POA: Diagnosis not present

## 2017-05-15 DIAGNOSIS — M5136 Other intervertebral disc degeneration, lumbar region: Secondary | ICD-10-CM | POA: Diagnosis not present

## 2017-05-19 DIAGNOSIS — M5136 Other intervertebral disc degeneration, lumbar region: Secondary | ICD-10-CM | POA: Diagnosis not present

## 2017-05-19 DIAGNOSIS — G8929 Other chronic pain: Secondary | ICD-10-CM | POA: Diagnosis not present

## 2017-05-19 DIAGNOSIS — E1151 Type 2 diabetes mellitus with diabetic peripheral angiopathy without gangrene: Secondary | ICD-10-CM | POA: Diagnosis not present

## 2017-05-19 DIAGNOSIS — I129 Hypertensive chronic kidney disease with stage 1 through stage 4 chronic kidney disease, or unspecified chronic kidney disease: Secondary | ICD-10-CM | POA: Diagnosis not present

## 2017-05-19 DIAGNOSIS — E1142 Type 2 diabetes mellitus with diabetic polyneuropathy: Secondary | ICD-10-CM | POA: Diagnosis not present

## 2017-05-19 DIAGNOSIS — M1990 Unspecified osteoarthritis, unspecified site: Secondary | ICD-10-CM | POA: Diagnosis not present

## 2017-05-22 DIAGNOSIS — E1151 Type 2 diabetes mellitus with diabetic peripheral angiopathy without gangrene: Secondary | ICD-10-CM | POA: Diagnosis not present

## 2017-05-22 DIAGNOSIS — I129 Hypertensive chronic kidney disease with stage 1 through stage 4 chronic kidney disease, or unspecified chronic kidney disease: Secondary | ICD-10-CM | POA: Diagnosis not present

## 2017-05-22 DIAGNOSIS — G8929 Other chronic pain: Secondary | ICD-10-CM | POA: Diagnosis not present

## 2017-05-22 DIAGNOSIS — E1142 Type 2 diabetes mellitus with diabetic polyneuropathy: Secondary | ICD-10-CM | POA: Diagnosis not present

## 2017-05-22 DIAGNOSIS — M1990 Unspecified osteoarthritis, unspecified site: Secondary | ICD-10-CM | POA: Diagnosis not present

## 2017-05-22 DIAGNOSIS — M5136 Other intervertebral disc degeneration, lumbar region: Secondary | ICD-10-CM | POA: Diagnosis not present

## 2017-05-28 ENCOUNTER — Encounter: Payer: Self-pay | Admitting: Podiatry

## 2017-05-28 ENCOUNTER — Ambulatory Visit (INDEPENDENT_AMBULATORY_CARE_PROVIDER_SITE_OTHER): Payer: Medicare HMO | Admitting: Podiatry

## 2017-05-28 DIAGNOSIS — M79676 Pain in unspecified toe(s): Secondary | ICD-10-CM

## 2017-05-28 DIAGNOSIS — B351 Tinea unguium: Secondary | ICD-10-CM | POA: Diagnosis not present

## 2017-05-28 DIAGNOSIS — E1149 Type 2 diabetes mellitus with other diabetic neurological complication: Secondary | ICD-10-CM

## 2017-05-28 NOTE — Progress Notes (Signed)
Patient ID: [name], female   DOB: [birth year], 83 y.o.   MRN: [id number] Complaint:  Visit Type: Patient returns to my office for continued preventative foot care services. Complaint: Patient states" my nails have grown long and thick and become painful to walk and wear shoes" Patient has been diagnosed with DM with neuropathy. The patient presents for preventative foot care services. No changes to ROS  Podiatric Exam: Vascular: dorsalis pedis is  palpable bilateral. Posterior tibial pulses are non palpable. Capillary return is immediate. Cold feet.  Sensorium: Diminished  Semmes Weinstein monofilament test. Normal tactile sensation bilaterally. Nail Exam: Pt has thick disfigured discolored nails with subungual debris noted bilateral entire nail hallux through fifth toenails Ulcer Exam: There is no evidence of ulcer or pre-ulcerative changes or infection. Orthopedic Exam: Muscle tone and strength are WNL. No limitations in general ROM. No crepitus or effusions noted. Foot type and digits show no abnormalities. HAV  B/L. Skin:  Asymptomatic  Porokeratosis dorsomedial exostosis 1st MPJ left foot.. No infection or ulcers  Diagnosis:  Onychomycosis, , Pain in right toe, pain in left toes,    Treatment & Plan Procedures and Treatment: Consent by patient was obtained for treatment procedures. The patient understood the discussion of treatment and procedures well. All questions were answered thoroughly reviewed. Debridement of mycotic and hypertrophic toenails, 1 through 5 bilateral and clearing of subungual debris. No ulceration, no infection noted.  Return Visit-Office Procedure: Patient instructed to return to the office for a follow up visit 3 months for continued evaluation and treatment.    Gardiner Barefoot DPM

## 2017-07-09 DIAGNOSIS — Z6824 Body mass index (BMI) 24.0-24.9, adult: Secondary | ICD-10-CM | POA: Diagnosis not present

## 2017-07-09 DIAGNOSIS — E1151 Type 2 diabetes mellitus with diabetic peripheral angiopathy without gangrene: Secondary | ICD-10-CM | POA: Diagnosis not present

## 2017-07-09 DIAGNOSIS — R531 Weakness: Secondary | ICD-10-CM | POA: Diagnosis not present

## 2017-07-09 DIAGNOSIS — R7309 Other abnormal glucose: Secondary | ICD-10-CM | POA: Diagnosis not present

## 2017-07-09 DIAGNOSIS — J189 Pneumonia, unspecified organism: Secondary | ICD-10-CM | POA: Diagnosis not present

## 2017-07-09 DIAGNOSIS — R358 Other polyuria: Secondary | ICD-10-CM | POA: Diagnosis not present

## 2017-07-09 DIAGNOSIS — I10 Essential (primary) hypertension: Secondary | ICD-10-CM | POA: Diagnosis not present

## 2017-07-09 DIAGNOSIS — R8299 Other abnormal findings in urine: Secondary | ICD-10-CM | POA: Diagnosis not present

## 2017-08-06 ENCOUNTER — Encounter: Payer: Self-pay | Admitting: Podiatry

## 2017-08-06 ENCOUNTER — Ambulatory Visit (INDEPENDENT_AMBULATORY_CARE_PROVIDER_SITE_OTHER): Payer: Medicare HMO | Admitting: Podiatry

## 2017-08-06 DIAGNOSIS — B351 Tinea unguium: Secondary | ICD-10-CM | POA: Diagnosis not present

## 2017-08-06 DIAGNOSIS — M79676 Pain in unspecified toe(s): Secondary | ICD-10-CM

## 2017-08-06 DIAGNOSIS — E1149 Type 2 diabetes mellitus with other diabetic neurological complication: Secondary | ICD-10-CM

## 2017-08-06 NOTE — Progress Notes (Signed)
Patient ID: [name], female   DOB: March 04, 1915, 83 y.o.   MRN: [id number] Complaint:  Visit Type: Patient returns to my office for continued preventative foot care services. Complaint: Patient states" my nails have grown long and thick and become painful to walk and wear shoes" Patient has been diagnosed with DM with neuropathy. The patient presents for preventative foot care services. No changes to ROS  Podiatric Exam: Vascular: dorsalis pedis is  palpable bilateral. Posterior tibial pulses are non palpable. Capillary return is immediate. Cold feet.  Sensorium: Diminished  Semmes Weinstein monofilament test. Normal tactile sensation bilaterally. Nail Exam: Pt has thick disfigured discolored nails with subungual debris noted bilateral entire nail hallux through fifth toenails Ulcer Exam: There is no evidence of ulcer or pre-ulcerative changes or infection. Orthopedic Exam: Muscle tone and strength are WNL. No limitations in general ROM. No crepitus or effusions noted. Foot type and digits show no abnormalities. HAV  B/L. Skin:  Asymptomatic  Porokeratosis dorsomedial exostosis 1st MPJ left foot.. No infection or ulcers  Diagnosis:  Onychomycosis, , Pain in right toe, pain in left toes,    Treatment & Plan Procedures and Treatment: Consent by patient was obtained for treatment procedures. The patient understood the discussion of treatment and procedures well. All questions were answered thoroughly reviewed. Debridement of mycotic and hypertrophic toenails, 1 through 5 bilateral and clearing of subungual debris. No ulceration, no infection noted.  Return Visit-Office Procedure: Patient instructed to return to the office for a follow up visit 3 months for continued evaluation and treatment.    Gardiner Barefoot DPM

## 2017-08-18 DIAGNOSIS — R600 Localized edema: Secondary | ICD-10-CM | POA: Diagnosis not present

## 2017-08-18 DIAGNOSIS — E784 Other hyperlipidemia: Secondary | ICD-10-CM | POA: Diagnosis not present

## 2017-08-18 DIAGNOSIS — I7389 Other specified peripheral vascular diseases: Secondary | ICD-10-CM | POA: Diagnosis not present

## 2017-08-18 DIAGNOSIS — E1151 Type 2 diabetes mellitus with diabetic peripheral angiopathy without gangrene: Secondary | ICD-10-CM | POA: Diagnosis not present

## 2017-08-18 DIAGNOSIS — E538 Deficiency of other specified B group vitamins: Secondary | ICD-10-CM | POA: Diagnosis not present

## 2017-08-18 DIAGNOSIS — I10 Essential (primary) hypertension: Secondary | ICD-10-CM | POA: Diagnosis not present

## 2017-08-18 DIAGNOSIS — E038 Other specified hypothyroidism: Secondary | ICD-10-CM | POA: Diagnosis not present

## 2017-08-18 DIAGNOSIS — E1140 Type 2 diabetes mellitus with diabetic neuropathy, unspecified: Secondary | ICD-10-CM | POA: Diagnosis not present

## 2017-08-18 DIAGNOSIS — R2681 Unsteadiness on feet: Secondary | ICD-10-CM | POA: Diagnosis not present

## 2017-10-23 DIAGNOSIS — Z23 Encounter for immunization: Secondary | ICD-10-CM | POA: Diagnosis not present

## 2017-11-12 ENCOUNTER — Encounter: Payer: Self-pay | Admitting: Podiatry

## 2017-11-12 ENCOUNTER — Ambulatory Visit: Payer: Medicare HMO | Admitting: Podiatry

## 2017-11-12 DIAGNOSIS — B351 Tinea unguium: Secondary | ICD-10-CM

## 2017-11-12 DIAGNOSIS — E1149 Type 2 diabetes mellitus with other diabetic neurological complication: Secondary | ICD-10-CM

## 2017-11-12 DIAGNOSIS — M79676 Pain in unspecified toe(s): Secondary | ICD-10-CM | POA: Diagnosis not present

## 2017-11-12 NOTE — Progress Notes (Signed)
Patient ID: [name], female   DOB: August 04, 1915, 83 y.o.   MRN: [id number] Complaint:  Visit Type: Patient returns to my office for continued preventative foot care services. Complaint: Patient states" my nails have grown long and thick and become painful to walk and wear shoes" Patient has been diagnosed with DM with neuropathy. The patient presents for preventative foot care services. No changes to ROS  Podiatric Exam: Vascular: dorsalis pedis is  palpable bilateral. Posterior tibial pulses are non palpable. Capillary return is immediate. Cold feet.  Sensorium: Diminished  Semmes Weinstein monofilament test. Normal tactile sensation bilaterally. Nail Exam: Pt has thick disfigured discolored nails with subungual debris noted bilateral entire nail hallux through fifth toenails Ulcer Exam: There is no evidence of ulcer or pre-ulcerative changes or infection. Orthopedic Exam: Muscle tone and strength are WNL. No limitations in general ROM. No crepitus or effusions noted. Foot type and digits show no abnormalities. HAV  B/L. Skin:  Asymptomatic  Porokeratosis dorsomedial exostosis 1st MPJ left foot.. No infection or ulcers  Diagnosis:  Onychomycosis, , Pain in right toe, pain in left toes,    Treatment & Plan Procedures and Treatment: Consent by patient was obtained for treatment procedures. The patient understood the discussion of treatment and procedures well. All questions were answered thoroughly reviewed. Debridement of mycotic and hypertrophic toenails, 1 through 5 bilateral and clearing of subungual debris. No ulceration, no infection noted.  Return Visit-Office Procedure: Patient instructed to return to the office for a follow up visit 3 months for continued evaluation and treatment.    Gardiner Barefoot DPM

## 2017-11-24 DIAGNOSIS — I7389 Other specified peripheral vascular diseases: Secondary | ICD-10-CM | POA: Diagnosis not present

## 2017-11-24 DIAGNOSIS — I10 Essential (primary) hypertension: Secondary | ICD-10-CM | POA: Diagnosis not present

## 2017-11-24 DIAGNOSIS — Z6824 Body mass index (BMI) 24.0-24.9, adult: Secondary | ICD-10-CM | POA: Diagnosis not present

## 2017-11-24 DIAGNOSIS — E1151 Type 2 diabetes mellitus with diabetic peripheral angiopathy without gangrene: Secondary | ICD-10-CM | POA: Diagnosis not present

## 2017-11-24 DIAGNOSIS — E038 Other specified hypothyroidism: Secondary | ICD-10-CM | POA: Diagnosis not present

## 2017-12-26 ENCOUNTER — Encounter (HOSPITAL_COMMUNITY): Payer: Self-pay

## 2017-12-26 ENCOUNTER — Inpatient Hospital Stay (HOSPITAL_COMMUNITY)
Admission: EM | Admit: 2017-12-26 | Discharge: 2017-12-28 | DRG: 690 | Disposition: A | Discharge status: Home Health | Payer: Medicare HMO | Attending: Internal Medicine | Admitting: Internal Medicine

## 2017-12-26 DIAGNOSIS — N10 Acute tubulo-interstitial nephritis: Secondary | ICD-10-CM | POA: Diagnosis not present

## 2017-12-26 DIAGNOSIS — E039 Hypothyroidism, unspecified: Secondary | ICD-10-CM | POA: Diagnosis not present

## 2017-12-26 DIAGNOSIS — Z888 Allergy status to other drugs, medicaments and biological substances status: Secondary | ICD-10-CM

## 2017-12-26 DIAGNOSIS — E1140 Type 2 diabetes mellitus with diabetic neuropathy, unspecified: Secondary | ICD-10-CM | POA: Diagnosis present

## 2017-12-26 DIAGNOSIS — E785 Hyperlipidemia, unspecified: Secondary | ICD-10-CM | POA: Diagnosis present

## 2017-12-26 DIAGNOSIS — E1129 Type 2 diabetes mellitus with other diabetic kidney complication: Secondary | ICD-10-CM | POA: Diagnosis not present

## 2017-12-26 DIAGNOSIS — N12 Tubulo-interstitial nephritis, not specified as acute or chronic: Secondary | ICD-10-CM | POA: Diagnosis not present

## 2017-12-26 DIAGNOSIS — N390 Urinary tract infection, site not specified: Secondary | ICD-10-CM | POA: Diagnosis not present

## 2017-12-26 DIAGNOSIS — I441 Atrioventricular block, second degree: Secondary | ICD-10-CM | POA: Diagnosis present

## 2017-12-26 DIAGNOSIS — Z66 Do not resuscitate: Secondary | ICD-10-CM | POA: Diagnosis present

## 2017-12-26 DIAGNOSIS — R7989 Other specified abnormal findings of blood chemistry: Secondary | ICD-10-CM

## 2017-12-26 DIAGNOSIS — Z794 Long term (current) use of insulin: Secondary | ICD-10-CM | POA: Diagnosis not present

## 2017-12-26 DIAGNOSIS — N183 Chronic kidney disease, stage 3 (moderate): Secondary | ICD-10-CM | POA: Diagnosis present

## 2017-12-26 DIAGNOSIS — R4182 Altered mental status, unspecified: Secondary | ICD-10-CM | POA: Diagnosis not present

## 2017-12-26 DIAGNOSIS — N139 Obstructive and reflux uropathy, unspecified: Secondary | ICD-10-CM

## 2017-12-26 DIAGNOSIS — I129 Hypertensive chronic kidney disease with stage 1 through stage 4 chronic kidney disease, or unspecified chronic kidney disease: Secondary | ICD-10-CM | POA: Diagnosis not present

## 2017-12-26 DIAGNOSIS — E1165 Type 2 diabetes mellitus with hyperglycemia: Secondary | ICD-10-CM | POA: Diagnosis present

## 2017-12-26 DIAGNOSIS — E1122 Type 2 diabetes mellitus with diabetic chronic kidney disease: Secondary | ICD-10-CM | POA: Diagnosis present

## 2017-12-26 DIAGNOSIS — N179 Acute kidney failure, unspecified: Secondary | ICD-10-CM | POA: Diagnosis present

## 2017-12-26 DIAGNOSIS — B9620 Unspecified Escherichia coli [E. coli] as the cause of diseases classified elsewhere: Secondary | ICD-10-CM | POA: Diagnosis not present

## 2017-12-26 DIAGNOSIS — Z79899 Other long term (current) drug therapy: Secondary | ICD-10-CM | POA: Diagnosis not present

## 2017-12-26 DIAGNOSIS — R739 Hyperglycemia, unspecified: Secondary | ICD-10-CM | POA: Diagnosis not present

## 2017-12-26 DIAGNOSIS — N136 Pyonephrosis: Secondary | ICD-10-CM | POA: Diagnosis not present

## 2017-12-26 DIAGNOSIS — N1330 Unspecified hydronephrosis: Secondary | ICD-10-CM | POA: Diagnosis not present

## 2017-12-26 LAB — CBC WITH DIFFERENTIAL/PLATELET
BASOS ABS: 0 10*3/uL (ref 0.0–0.1)
Basophils Relative: 0 %
Eosinophils Absolute: 0 10*3/uL (ref 0.0–0.7)
Eosinophils Relative: 0 %
HEMATOCRIT: 37.1 % (ref 36.0–46.0)
HEMOGLOBIN: 12 g/dL (ref 12.0–15.0)
Lymphocytes Relative: 16 %
Lymphs Abs: 1.8 10*3/uL (ref 0.7–4.0)
MCH: 31.5 pg (ref 26.0–34.0)
MCHC: 32.3 g/dL (ref 30.0–36.0)
MCV: 97.4 fL (ref 78.0–100.0)
MONOS PCT: 5 %
Monocytes Absolute: 0.5 10*3/uL (ref 0.1–1.0)
NEUTROS ABS: 9 10*3/uL — AB (ref 1.7–7.7)
NEUTROS PCT: 79 %
Platelets: 194 10*3/uL (ref 150–400)
RBC: 3.81 MIL/uL — ABNORMAL LOW (ref 3.87–5.11)
RDW: 13 % (ref 11.5–15.5)
WBC: 11.3 10*3/uL — ABNORMAL HIGH (ref 4.0–10.5)

## 2017-12-26 LAB — CBG MONITORING, ED: GLUCOSE-CAPILLARY: 269 mg/dL — AB (ref 65–99)

## 2017-12-26 LAB — COMPREHENSIVE METABOLIC PANEL
ALBUMIN: 3.3 g/dL — AB (ref 3.5–5.0)
ALK PHOS: 64 U/L (ref 38–126)
ALT: 13 U/L — AB (ref 14–54)
AST: 26 U/L (ref 15–41)
Anion gap: 13 (ref 5–15)
BILIRUBIN TOTAL: 1.2 mg/dL (ref 0.3–1.2)
BUN: 60 mg/dL — AB (ref 6–20)
CALCIUM: 9.1 mg/dL (ref 8.9–10.3)
CO2: 24 mmol/L (ref 22–32)
Chloride: 100 mmol/L — ABNORMAL LOW (ref 101–111)
Creatinine, Ser: 3.32 mg/dL — ABNORMAL HIGH (ref 0.44–1.00)
GFR calc Af Amer: 12 mL/min — ABNORMAL LOW (ref >60)
GFR calc non Af Amer: 10 mL/min — ABNORMAL LOW (ref >60)
GLUCOSE: 304 mg/dL — AB (ref 65–99)
Potassium: 5.1 mmol/L (ref 3.5–5.1)
Sodium: 137 mmol/L (ref 135–145)
TOTAL PROTEIN: 6.7 g/dL (ref 6.5–8.1)

## 2017-12-26 LAB — URINALYSIS, ROUTINE W REFLEX MICROSCOPIC
Bilirubin Urine: NEGATIVE
Glucose, UA: >=500 mg/dL — AB
KETONES UR: NEGATIVE mg/dL
Nitrite: NEGATIVE
PH: 6 (ref 5.0–8.0)
Protein, ur: NEGATIVE mg/dL
SPECIFIC GRAVITY, URINE: 1.009 (ref 1.005–1.030)
SQUAMOUS EPITHELIAL / LPF: NONE SEEN

## 2017-12-26 LAB — LIPASE, BLOOD: Lipase: 21 U/L (ref 11–51)

## 2017-12-26 MED ORDER — SODIUM CHLORIDE 0.9 % IV BOLUS (SEPSIS)
1000.0000 mL | Freq: Once | INTRAVENOUS | Status: AC
  Administered 2017-12-26: 1000 mL via INTRAVENOUS

## 2017-12-26 MED ORDER — DEXTROSE 5 % IV SOLN
1.0000 g | Freq: Once | INTRAVENOUS | Status: AC
  Administered 2017-12-26: 1 g via INTRAVENOUS
  Filled 2017-12-26: qty 10

## 2017-12-26 MED ORDER — ONDANSETRON HCL 4 MG/2ML IJ SOLN
4.0000 mg | Freq: Once | INTRAMUSCULAR | Status: AC
  Administered 2017-12-26: 4 mg via INTRAVENOUS
  Filled 2017-12-26: qty 2

## 2017-12-26 NOTE — ED Triage Notes (Signed)
Pt arrived from [facility] c/o N/Vx1 day. EMS gave 283ml NS, 4mg  IV Zofran.  CBG 294 @ dinner given 30 units of Novolog at facility.  Hx. A-fib w/ RBBB

## 2017-12-26 NOTE — ED Provider Notes (Signed)
[facility] EMERGENCY DEPARTMENT Provider Note   CSN: [id number] Arrival date & time: 12/26/17  2057   History   Chief Complaint Chief Complaint  Patient presents with  . Hyperglycemia    HPI [name] is a 83 y.o. female.  The history is provided by the patient and the EMS personnel.  Abdominal Cramping  This is a new problem. The current episode started 3 to 5 hours ago. Episode frequency: intermittently. The problem has been resolved. Associated symptoms include abdominal pain. Pertinent negatives include no chest pain, no headaches and no shortness of breath. Nothing aggravates the symptoms. Nothing relieves the symptoms. She has tried nothing for the symptoms.    Past Medical History:  Diagnosis Date  . Chronic kidney disease (CKD), stage III (moderate) ([place])    [name] 12/06/2015  . History of blood transfusion    "when my son was born; maybe once since" (04/30/2016)  . Hypertension   . Hypothyroidism   . Pneumonia 1930s; ? 11/2015  . Tuberculosis 1930s  . Type II diabetes mellitus ([employer]) since 1964    Patient Active Problem List   Diagnosis Date Noted  . Corns and callosities 12/17/2016  . Palpitations 05/08/2016  . Mobitz type 1 second degree AV block 05/08/2016  . Trifascicular block 05/08/2016  . Protein-calorie malnutrition, severe 05/01/2016  . Hypertensive urgency 04/30/2016  . Hypertensive urgency, malignant 04/30/2016  . Sepsis due to pneumonia ([place]) 12/06/2015  . Acute respiratory failure with hypoxia ([place]) 12/06/2015  . Controlled diabetes mellitus with diabetic neuropathy, with long-term current use of insulin ([place]) 12/06/2015  . Dyslipidemia associated with type 2 diabetes mellitus ([place]) 12/06/2015  . Leukocytosis 12/06/2015  . Thrombocytopenia ([place]) 12/06/2015  . COPD exacerbation ([place]) 12/06/2015  . Cerebellar stroke ([place]) 03/06/2015  . Chronic kidney disease, stage III (moderate) ([place]) 08/11/2013  . Hypothyroidism  03/15/2013  . Diabetic neuropathy ([place]) 03/15/2013    Past Surgical History:  Procedure Laterality Date  . CATARACT EXTRACTION W/ INTRAOCULAR LENS  IMPLANT, BILATERAL Bilateral   . ESOPHAGOGASTRODUODENOSCOPY N/A 03/03/2015   Procedure: ESOPHAGOGASTRODUODENOSCOPY (EGD);  Surgeon: Inda Castle, MD;  Location: [facility];  Service: Endoscopy;  Laterality: N/A;  . ESOPHAGOGASTRODUODENOSCOPY N/A 03/07/2015   Procedure: ESOPHAGOGASTRODUODENOSCOPY (EGD);  Surgeon: Jerene Bears, MD;  Location: [facility] ENDOSCOPY;  Service: Endoscopy;  Laterality: N/A;  . FRACTURE SURGERY    . HIP FRACTURE SURGERY Bilateral   . TONSILLECTOMY      OB History    No data available       Home Medications    Prior to Admission medications   Medication Sig Start Date End Date Taking? Authorizing Provider  acetaminophen (MAPAP) 325 MG tablet Take 650 mg by mouth every 6 (six) hours as needed (for fever).   Yes [provider]  albuterol (VENTOLIN HFA) 108 (90 Base) MCG/ACT inhaler Inhale 2 puffs into the lungs every 4 (four) hours as needed for wheezing or shortness of breath.   Yes [provider]  amLODipine (NORVASC) 2.5 MG tablet Take 2.5 mg by mouth daily.   Yes [provider]  aspirin 81 MG tablet Take 81 mg by mouth daily.   Yes [provider]  ferrous sulfate 325 (65 FE) MG tablet Take 325 mg by mouth daily with breakfast.   Yes [provider]  furosemide (LASIX) 20 MG tablet Take 10 mg by mouth daily.    Yes [provider]  glimepiride (AMARYL) 2 MG tablet TAKE 1 TABLET BY  MOUTH EVERY MORNING AND 1/2 TABLET AT [facility] Patient taking differently: Take 2 mg by mouth in the morning and 1 mg in the evening 07/17/15  Yes Elayne Snare, MD  hydrochlorothiazide (HYDRODIURIL) 25 MG tablet Take 12.5 mg by mouth daily.   Yes [provider]  insulin aspart (NOVOLOG FLEXPEN) 100 UNIT/ML FlexPen If blood sugar is over 200, take 5 units and if they go over 300,  take 6 units at that meal. Patient taking differently: Inject 4-8 Units into the skin See admin instructions. 4-8 units three times a day before meals per sliding scale: BGL 150-200 = 4 units; 201-250 = 6 units; >250 = 8 units AND ADD 4 UNITS TO SLIDING SCALE DOSE WITH EVERY BREAKFAST 02/16/15  Yes Elayne Snare, MD  ipratropium (ATROVENT) 0.03 % nasal spray Place 2 sprays into both nostrils 3 (three) times daily before meals.   Yes [provider]  Multiple Vitamin (DAILY-VITE PO) Take 1 tablet by mouth daily.   Yes [provider]  pantoprazole (PROTONIX) 40 MG tablet TAKE 1 TABLET BY MOUTH TWICE DAILY Patient taking differently: Take 40 mg by mouth in the morning 09/08/15  Yes Elayne Snare, MD  pravastatin (PRAVACHOL) 20 MG tablet Take 20 mg by mouth daily. 06/25/15  Yes [provider]  pregabalin (LYRICA) 100 MG capsule Take 100 mg by mouth See admin instructions. 100 mg once a day at 7 PM   Yes [provider]  Probiotic Product ([place]) CAPS Take 1 capsule by mouth daily.   Yes [provider]  sennosides-docusate sodium (SENOKOT-S) 8.6-50 MG tablet Take 1 tablet by mouth daily.    Yes [provider]  SYNTHROID 88 MCG tablet TAKE 1 TABLET DAILY Patient taking differently: Take 88 mcg by  mouth once a day 07/10/16  Yes Elayne Snare, MD  traMADol (ULTRAM) 50 MG tablet Take 1 tablet (50 mg total) by mouth 2 (two) times daily. Patient taking differently: Take 50 mg by mouth See admin instructions. 50 mg once a day AS NEEDED for moderate pain and 50 mg at bedtime SCHEDULED 09/15/15  Yes Elayne Snare, MD  vitamin B-12 (CYANOCOBALAMIN) 1000 MCG tablet Take 1,000 mcg by mouth daily.   Yes [provider]  amLODipine (NORVASC) 5 MG tablet Take 1 tablet (5 mg total) by mouth daily. Patient not taking: Reported on 12/26/2017 05/02/16   Theodis Blaze, MD  LYRICA 75 MG capsule TAKE 1 CAPSULE TWICE DAILY Patient not taking: Reported on  12/26/2017 01/29/16   Elayne Snare, MD    Family History Family History  Problem Relation Age of Onset  . Diabetes Father     Social History Social History   Tobacco Use  . Smoking status: Never Smoker  . Smokeless tobacco: Never Used  Substance Use Topics  . Alcohol use: Yes    Comment: "I have had some alcohol in my younger days; a long time ago""  . Drug use: No     Allergies   Capsaicin   Review of Systems Review of Systems  Constitutional: Negative for chills and fever.  HENT: Negative for rhinorrhea and sore throat.   Eyes: Negative for pain and visual disturbance.  Respiratory: Negative for cough and shortness of breath.   Cardiovascular: Negative for chest pain and palpitations.  Gastrointestinal: Positive for abdominal pain, nausea and vomiting.  Genitourinary: Negative for dysuria.  Musculoskeletal: Negative for arthralgias and back pain.  Skin: Negative for rash.  Neurological: Negative for syncope and  headaches.  All other systems reviewed and are negative.    Physical Exam Updated Vital Signs BP (!) 185/64 (BP Location: Right Arm)   Pulse 74   Temp 98 F (36.7 C) (Oral)   Resp 18   SpO2 97%   Physical Exam  Constitutional: She is oriented to person, place, and time. She appears well-developed and well-nourished. No distress.  HENT:  Head: Normocephalic and atraumatic.  Eyes: Conjunctivae are normal.  Neck: Neck supple.  Cardiovascular: Normal rate and regular rhythm.  No murmur heard. Pulmonary/Chest: Effort normal and breath sounds normal. No respiratory distress.  Abdominal: Soft. She exhibits distension (mild). There is no tenderness.  Musculoskeletal: She exhibits no edema or deformity.  No CVAT  Neurological: She is alert and oriented to person, place, and time.  Skin: Skin is warm and dry.  Psychiatric: She has a normal mood and affect.  Nursing note and vitals reviewed.    ED Treatments / Results  Labs (all labs ordered are  listed, but only abnormal results are displayed) Labs Reviewed  CBC WITH DIFFERENTIAL/PLATELET - Abnormal; Notable for the following components:      Result Value   WBC 11.3 (*)    RBC 3.81 (*)    Neutro Abs 9.0 (*)    All other components within normal limits  COMPREHENSIVE METABOLIC PANEL - Abnormal; Notable for the following components:   Chloride 100 (*)    Glucose, Bld 304 (*)    BUN 60 (*)    Creatinine, Ser 3.32 (*)    Albumin 3.3 (*)    ALT 13 (*)    GFR calc non Af Amer 10 (*)    GFR calc Af Amer 12 (*)    All other components within normal limits  URINALYSIS, ROUTINE W REFLEX MICROSCOPIC - Abnormal; Notable for the following components:   APPearance HAZY (*)    Glucose, UA >=500 (*)    Hgb urine dipstick SMALL (*)    Leukocytes, UA LARGE (*)    Bacteria, UA MANY (*)    All other components within normal limits  CBG MONITORING, ED - Abnormal; Notable for the following components:   Glucose-Capillary 269 (*)    All other components within normal limits  URINE CULTURE  LIPASE, BLOOD    EKG  EKG Interpretation None       Radiology No results found.  Procedures Procedures (including critical care time)  Medications Ordered in ED Medications  cefTRIAXone (ROCEPHIN) 1 g in dextrose 5 % 50 mL IVPB (not administered)  sodium chloride 0.9 % bolus 1,000 mL (1,000 mLs Intravenous New Bag/Given 12/26/17 2126)  ondansetron (ZOFRAN) injection 4 mg (4 mg Intravenous Given 12/26/17 2127)     Initial Impression / Assessment and Plan / ED Course  I have reviewed the triage vital signs and the nursing notes.  Pertinent labs & imaging results that were available during my care of the patient were reviewed by me and considered in my medical decision making (see chart for details).     Pt with h/o HTN, hypothyroidism, DM, first-degree AV block, RBBB, LAFB presents with abdominal pain and emesis. Says she wasn't feeling well earlier today and at around 1530 when playing  bingo, she felt some cramping abdominal pain and then shortly after became nauseated; she went to her room and says she had several small volume episodes of emesis. Denies F/C, HA, lightheadedness, CP, SOB, diarrhea, urinary symptoms. Says she had a small BM yesterday, but did not move her  bowels today. Pt was hyperglycemic at dinner w/FSBG 294 & received her normal 30u Novalog. Pt received 248mL NS & 4mg  Zofran.  VS & exam as above. Labs remarkable for BUN/Crt 60/3.32 (last comparison from 5/17 was 36/1.60). UA w/large LE, TNTC WBC, many bacteria. Rocephin given in the ED for pyelonephritis.  Will admit the Pt to the Hospitalist for further evaluation and treatment; requesting abdominal US to r/o obstruction before leaving the ED.  Final Clinical Impressions(s) / ED Diagnoses   Final diagnoses:  Pyelonephritis    ED Discharge Orders    None       Jenny Reichmann, MD 12/26/17 2325    Isla Pence, MD 12/29/17 1010

## 2017-12-26 NOTE — ED Notes (Signed)
Bob(SON) [id number]) Please call if needed

## 2017-12-27 ENCOUNTER — Observation Stay (HOSPITAL_COMMUNITY): Payer: Medicare HMO

## 2017-12-27 ENCOUNTER — Encounter (HOSPITAL_COMMUNITY): Payer: Self-pay | Admitting: Internal Medicine

## 2017-12-27 DIAGNOSIS — N139 Obstructive and reflux uropathy, unspecified: Secondary | ICD-10-CM

## 2017-12-27 DIAGNOSIS — N179 Acute kidney failure, unspecified: Secondary | ICD-10-CM

## 2017-12-27 DIAGNOSIS — I441 Atrioventricular block, second degree: Secondary | ICD-10-CM | POA: Diagnosis not present

## 2017-12-27 DIAGNOSIS — N1330 Unspecified hydronephrosis: Secondary | ICD-10-CM | POA: Diagnosis not present

## 2017-12-27 DIAGNOSIS — R7989 Other specified abnormal findings of blood chemistry: Secondary | ICD-10-CM | POA: Diagnosis not present

## 2017-12-27 DIAGNOSIS — N12 Tubulo-interstitial nephritis, not specified as acute or chronic: Secondary | ICD-10-CM | POA: Diagnosis present

## 2017-12-27 LAB — BASIC METABOLIC PANEL
Anion gap: 15 (ref 5–15)
BUN: 52 mg/dL — AB (ref 6–20)
CHLORIDE: 101 mmol/L (ref 101–111)
CO2: 21 mmol/L — ABNORMAL LOW (ref 22–32)
Calcium: 8.9 mg/dL (ref 8.9–10.3)
Creatinine, Ser: 2.92 mg/dL — ABNORMAL HIGH (ref 0.44–1.00)
GFR calc Af Amer: 14 mL/min — ABNORMAL LOW (ref >60)
GFR calc non Af Amer: 12 mL/min — ABNORMAL LOW (ref >60)
Glucose, Bld: 275 mg/dL — ABNORMAL HIGH (ref 65–99)
POTASSIUM: 4.6 mmol/L (ref 3.5–5.1)
SODIUM: 137 mmol/L (ref 135–145)

## 2017-12-27 LAB — CBC
HEMATOCRIT: 38.4 % (ref 36.0–46.0)
Hemoglobin: 12.4 g/dL (ref 12.0–15.0)
MCH: 31.6 pg (ref 26.0–34.0)
MCHC: 32.3 g/dL (ref 30.0–36.0)
MCV: 98 fL (ref 78.0–100.0)
Platelets: 164 10*3/uL (ref 150–400)
RBC: 3.92 MIL/uL (ref 3.87–5.11)
RDW: 13.1 % (ref 11.5–15.5)
WBC: 11.8 10*3/uL — AB (ref 4.0–10.5)

## 2017-12-27 LAB — MRSA PCR SCREENING: MRSA by PCR: NEGATIVE

## 2017-12-27 LAB — CBG MONITORING, ED: Glucose-Capillary: 276 mg/dL — ABNORMAL HIGH (ref 65–99)

## 2017-12-27 LAB — GLUCOSE, CAPILLARY
GLUCOSE-CAPILLARY: 285 mg/dL — AB (ref 65–99)
Glucose-Capillary: 229 mg/dL — ABNORMAL HIGH (ref 65–99)
Glucose-Capillary: 246 mg/dL — ABNORMAL HIGH (ref 65–99)

## 2017-12-27 MED ORDER — PRAVASTATIN SODIUM 10 MG PO TABS
20.0000 mg | ORAL_TABLET | Freq: Every day | ORAL | Status: DC
  Administered 2017-12-27 – 2017-12-28 (×2): 20 mg via ORAL
  Filled 2017-12-27 (×2): qty 2

## 2017-12-27 MED ORDER — INSULIN ASPART 100 UNIT/ML ~~LOC~~ SOLN
0.0000 [IU] | Freq: Three times a day (TID) | SUBCUTANEOUS | Status: DC
  Administered 2017-12-27 (×2): 3 [IU] via SUBCUTANEOUS
  Administered 2017-12-27: 5 [IU] via SUBCUTANEOUS
  Administered 2017-12-28: 2 [IU] via SUBCUTANEOUS
  Administered 2017-12-28: 5 [IU] via SUBCUTANEOUS
  Administered 2017-12-28: 7 [IU] via SUBCUTANEOUS

## 2017-12-27 MED ORDER — DEXTROSE 5 % IV SOLN
1.0000 g | INTRAVENOUS | Status: DC
  Administered 2017-12-27: 1 g via INTRAVENOUS
  Filled 2017-12-27 (×2): qty 10

## 2017-12-27 MED ORDER — SENNOSIDES-DOCUSATE SODIUM 8.6-50 MG PO TABS
1.0000 | ORAL_TABLET | Freq: Every day | ORAL | Status: DC
  Administered 2017-12-27 – 2017-12-28 (×2): 1 via ORAL
  Filled 2017-12-27 (×2): qty 1

## 2017-12-27 MED ORDER — HYDRALAZINE HCL 20 MG/ML IJ SOLN
10.0000 mg | INTRAMUSCULAR | Status: DC | PRN
  Administered 2017-12-27: 10 mg via INTRAVENOUS
  Filled 2017-12-27: qty 1

## 2017-12-27 MED ORDER — LEVOTHYROXINE SODIUM 88 MCG PO TABS
88.0000 ug | ORAL_TABLET | Freq: Every day | ORAL | Status: DC
  Administered 2017-12-27 – 2017-12-28 (×2): 88 ug via ORAL
  Filled 2017-12-27 (×2): qty 1

## 2017-12-27 MED ORDER — PANTOPRAZOLE SODIUM 40 MG PO TBEC
40.0000 mg | DELAYED_RELEASE_TABLET | Freq: Two times a day (BID) | ORAL | Status: DC
  Administered 2017-12-27 – 2017-12-28 (×3): 40 mg via ORAL
  Filled 2017-12-27 (×3): qty 1

## 2017-12-27 MED ORDER — FERROUS SULFATE 325 (65 FE) MG PO TABS
325.0000 mg | ORAL_TABLET | Freq: Every day | ORAL | Status: DC
Start: 2017-12-27 — End: 2017-12-28
  Administered 2017-12-27 – 2017-12-28 (×2): 325 mg via ORAL
  Filled 2017-12-27 (×2): qty 1

## 2017-12-27 MED ORDER — ONDANSETRON HCL 4 MG/2ML IJ SOLN
4.0000 mg | Freq: Four times a day (QID) | INTRAMUSCULAR | Status: DC | PRN
  Administered 2017-12-27: 4 mg via INTRAVENOUS
  Filled 2017-12-27: qty 2

## 2017-12-27 MED ORDER — PREGABALIN 50 MG PO CAPS
100.0000 mg | ORAL_CAPSULE | ORAL | Status: DC
  Administered 2017-12-27: 100 mg via ORAL
  Filled 2017-12-27: qty 2

## 2017-12-27 MED ORDER — ACETAMINOPHEN 650 MG RE SUPP
650.0000 mg | Freq: Four times a day (QID) | RECTAL | Status: DC | PRN
Start: 2017-12-27 — End: 2017-12-28

## 2017-12-27 MED ORDER — ALBUTEROL SULFATE (2.5 MG/3ML) 0.083% IN NEBU: 3.0000 mL | INHALATION_SOLUTION | RESPIRATORY_TRACT | Status: DC | PRN

## 2017-12-27 MED ORDER — IPRATROPIUM BROMIDE 0.06 % NA SOLN
2.0000 | Freq: Three times a day (TID) | NASAL | Status: DC
  Administered 2017-12-27 – 2017-12-28 (×6): 2 via NASAL
  Filled 2017-12-27: qty 15

## 2017-12-27 MED ORDER — POLYETHYLENE GLYCOL 3350 17 G PO PACK
17.0000 g | PACK | Freq: Every day | ORAL | Status: DC
  Administered 2017-12-27 – 2017-12-28 (×2): 17 g via ORAL
  Filled 2017-12-27 (×2): qty 1

## 2017-12-27 MED ORDER — SODIUM CHLORIDE 0.9 % IV SOLN
INTRAVENOUS | Status: AC
  Administered 2017-12-27: 05:00:00 via INTRAVENOUS

## 2017-12-27 MED ORDER — AMLODIPINE BESYLATE 2.5 MG PO TABS
2.5000 mg | ORAL_TABLET | Freq: Every day | ORAL | Status: DC
  Administered 2017-12-27 – 2017-12-28 (×2): 2.5 mg via ORAL
  Filled 2017-12-27 (×2): qty 1

## 2017-12-27 MED ORDER — ONDANSETRON HCL 4 MG PO TABS: 4.0000 mg | ORAL_TABLET | Freq: Four times a day (QID) | ORAL | Status: DC | PRN

## 2017-12-27 MED ORDER — DOCUSATE SODIUM 100 MG PO CAPS
100.0000 mg | ORAL_CAPSULE | Freq: Two times a day (BID) | ORAL | Status: DC
  Administered 2017-12-27 – 2017-12-28 (×2): 100 mg via ORAL
  Filled 2017-12-27 (×2): qty 1

## 2017-12-27 MED ORDER — ACETAMINOPHEN 325 MG PO TABS
650.0000 mg | ORAL_TABLET | Freq: Four times a day (QID) | ORAL | Status: DC | PRN
  Filled 2017-12-27: qty 2

## 2017-12-27 NOTE — H&P (Signed)
History and Physical    [name] DJM:[id number] DOB: 04-17-1915 DOA: 12/26/2017  PCP: Leanna Battles, MD  Patient coming from: [place].  Chief Complaint: Abdominal discomfort.  Elevated blood sugar.  HPI: [name] is a 83 y.o. female with history of diabetes mellitus type 2, hypothyroidism, hypertension, hyperlipidemia was referred to the ER after patient had some abdominal discomfort with nausea vomiting.  Patient is a poor historian and most of the history was obtained from the ER physician.  Patient blood sugar also was found to be elevated.  Patient did not complain of any chest pain or shortness of breath.  ED Course: In the ER labs revealed acute renal failure and also UA is consistent with UTI.  Patient was started on ceftriaxone for UTI and renal sonogram was ordered to rule out any obstruction.  Renal sonogram was showing left-sided moderate hydronephrosis with possible distal obstruction.  On my exam patient is not in distress.  Abdomen appears benign on exam.  Review of Systems: As per HPI, rest all negative.   Past Medical History:  Diagnosis Date  . Chronic kidney disease (CKD), stage III (moderate) ([place])    [name] 12/06/2015  . History of blood transfusion    "when my son was born; maybe once since" (04/30/2016)  . Hypertension   . Hypothyroidism   . Pneumonia 1930s; ? 11/2015  . Tuberculosis 1930s  . Type II diabetes mellitus ([place]) since 1964    Past Surgical History:  Procedure Laterality Date  . CATARACT EXTRACTION W/ INTRAOCULAR LENS  IMPLANT, BILATERAL Bilateral   . ESOPHAGOGASTRODUODENOSCOPY N/A 03/03/2015   Procedure: ESOPHAGOGASTRODUODENOSCOPY (EGD);  Surgeon: Inda Castle, MD;  Location: [place];  Service: Endoscopy;  Laterality: N/A;  . ESOPHAGOGASTRODUODENOSCOPY N/A 03/07/2015   Procedure: ESOPHAGOGASTRODUODENOSCOPY (EGD);  Surgeon: Jerene Bears, MD;  Location: [facility] ENDOSCOPY;  Service: Endoscopy;  Laterality: N/A;  .  FRACTURE SURGERY    . HIP FRACTURE SURGERY Bilateral   . TONSILLECTOMY       reports that  has never smoked. she has never used smokeless tobacco. She reports that she drinks alcohol. She reports that she does not use drugs.  Allergies  Allergen Reactions  . Capsaicin Other (See Comments)    Topical application BURNED the skin    Family History  Problem Relation Age of Onset  . Diabetes Father     Prior to Admission medications   Medication Sig Start Date End Date Taking? Authorizing Provider  acetaminophen (MAPAP) 325 MG tablet Take 650 mg by mouth every 6 (six) hours as needed (for fever).   Yes [provider]  albuterol (VENTOLIN HFA) 108 (90 Base) MCG/ACT inhaler Inhale 2 puffs into the lungs every 4 (four) hours as needed for wheezing or shortness of breath.   Yes [provider]  amLODipine (NORVASC) 2.5 MG tablet Take 2.5 mg by mouth daily.   Yes [provider]  aspirin 81 MG tablet Take 81 mg by mouth daily.   Yes [provider]  ferrous sulfate 325 (65 FE) MG tablet Take 325 mg by mouth daily with breakfast.   Yes [provider]  furosemide (LASIX) 20 MG tablet Take 10 mg by mouth daily.    Yes [provider]  glimepiride (AMARYL) 2 MG tablet TAKE 1 TABLET BY MOUTH EVERY MORNING AND 1/2 TABLET AT [facility] Patient taking differently: Take 2 mg by mouth in the morning and 1 mg in the evening 07/17/15  Yes Dwyane Dee,  Vicenta Aly, MD  hydrochlorothiazide (HYDRODIURIL) 25 MG tablet Take 12.5 mg by mouth daily.   Yes [provider]  insulin aspart (NOVOLOG FLEXPEN) 100 UNIT/ML FlexPen If blood sugar is over 200, take 5 units and if they go over 300, take 6 units at that meal. Patient taking differently: Inject 4-8 Units into the skin See admin instructions. 4-8 units three times a day before meals per sliding scale: BGL 150-200 = 4 units; 201-250 = 6 units; >250 = 8 units AND ADD 4 UNITS TO SLIDING SCALE DOSE WITH EVERY BREAKFAST  02/16/15  Yes Elayne Snare, MD  ipratropium (ATROVENT) 0.03 % nasal spray Place 2 sprays into both nostrils 3 (three) times daily before meals.   Yes [provider]  Multiple Vitamin (DAILY-VITE PO) Take 1 tablet by mouth daily.   Yes [provider]  pantoprazole (PROTONIX) 40 MG tablet TAKE 1 TABLET BY MOUTH TWICE DAILY Patient taking differently: Take 40 mg by mouth in the morning 09/08/15  Yes Elayne Snare, MD  pravastatin (PRAVACHOL) 20 MG tablet Take 20 mg by mouth daily. 06/25/15  Yes [provider]  pregabalin (LYRICA) 100 MG capsule Take 100 mg by mouth See admin instructions. 100 mg once a day at 7 PM   Yes [provider]  Probiotic Product ([place]) CAPS Take 1 capsule by mouth daily.   Yes [provider]  sennosides-docusate sodium (SENOKOT-S) 8.6-50 MG tablet Take 1 tablet by mouth daily.    Yes [provider]  SYNTHROID 88 MCG tablet TAKE 1 TABLET DAILY Patient taking differently: Take 88 mcg by  mouth once a day 07/10/16  Yes Elayne Snare, MD  traMADol (ULTRAM) 50 MG tablet Take 1 tablet (50 mg total) by mouth 2 (two) times daily. Patient taking differently: Take 50 mg by mouth See admin instructions. 50 mg once a day AS NEEDED for moderate pain and 50 mg at bedtime SCHEDULED 09/15/15  Yes Elayne Snare, MD  vitamin B-12 (CYANOCOBALAMIN) 1000 MCG tablet Take 1,000 mcg by mouth daily.   Yes [provider]  amLODipine (NORVASC) 5 MG tablet Take 1 tablet (5 mg total) by mouth daily. Patient not taking: Reported on 12/26/2017 05/02/16   Theodis Blaze, MD  LYRICA 75 MG capsule TAKE 1 CAPSULE TWICE DAILY Patient not taking: Reported on 12/26/2017 01/29/16   Elayne Snare, MD    Physical Exam: Vitals:   12/26/17 2140 12/27/17 0145 12/27/17 0303 12/27/17 0341  BP: (!) 185/64 (!) 148/94 (!) 152/96 (!) 186/74  Pulse: 74 78 72 67  Resp: 18 16 14 16   Temp: 98 F (36.7 C)   97.6 F (36.4 C)  TempSrc: Oral   Oral    SpO2: 97% 99% 99% 93%  Weight:    66.7 kg (147 lb 0.8 oz)      Constitutional: Moderately built and nourished. Vitals:   12/26/17 2140 12/27/17 0145 12/27/17 0303 12/27/17 0341  BP: (!) 185/64 (!) 148/94 (!) 152/96 (!) 186/74  Pulse: 74 78 72 67  Resp: 18 16 14 16   Temp: 98 F (36.7 C)   97.6 F (36.4 C)  TempSrc: Oral   Oral  SpO2: 97% 99% 99% 93%  Weight:    66.7 kg (147 lb 0.8 oz)   Eyes: Anicteric no pallor. ENMT: No discharge from the ears eyes nose or mouth. Neck: No mass felt.  No neck rigidity. Respiratory: No rhonchi or crepitations. Cardiovascular: S1-S2 heard no murmurs appreciated. Abdomen: Soft nontender bowel sounds  present. Musculoskeletal: No edema.  No joint effusion. Skin: No rash.  Skin appears warm. Neurologic: Alert awake oriented to person and place.  Moves all extremities. Psychiatric: Appears normal.  Normal affect.   Labs on Admission: I have personally reviewed following labs and imaging studies  CBC: Recent Labs  Lab 12/26/17 2117  WBC 11.3*  NEUTROABS 9.0*  HGB 12.0  HCT 37.1  MCV 97.4  PLT 595   Basic Metabolic Panel: Recent Labs  Lab 12/26/17 2117  NA 137  K 5.1  CL 100*  CO2 24  GLUCOSE 304*  BUN 60*  CREATININE 3.32*  CALCIUM 9.1   GFR: CrCl cannot be calculated (Unknown ideal weight.). Liver Function Tests: Recent Labs  Lab 12/26/17 2117  AST 26  ALT 13*  ALKPHOS 64  BILITOT 1.2  PROT 6.7  ALBUMIN 3.3*   Recent Labs  Lab 12/26/17 2117  LIPASE 21   No results for input(s): AMMONIA in the last 168 hours. Coagulation Profile: No results for input(s): INR, PROTIME in the last 168 hours. Cardiac Enzymes: No results for input(s): CKTOTAL, CKMB, CKMBINDEX, TROPONINI in the last 168 hours. BNP (last 3 results) No results for input(s): PROBNP in the last 8760 hours. HbA1C: No results for input(s): HGBA1C in the last 72 hours. CBG: Recent Labs  Lab 12/26/17 2137 12/27/17 0305  GLUCAP 269* 276*   Lipid  Profile: No results for input(s): CHOL, HDL, LDLCALC, TRIG, CHOLHDL, LDLDIRECT in the last 72 hours. Thyroid Function Tests: No results for input(s): TSH, T4TOTAL, FREET4, T3FREE, THYROIDAB in the last 72 hours. Anemia Panel: No results for input(s): VITAMINB12, FOLATE, FERRITIN, TIBC, IRON, RETICCTPCT in the last 72 hours. Urine analysis:    Component Value Date/Time   COLORURINE YELLOW 12/26/2017 2239   APPEARANCEUR HAZY (A) 12/26/2017 2239   LABSPEC 1.009 12/26/2017 2239   PHURINE 6.0 12/26/2017 2239   GLUCOSEU >=500 (A) 12/26/2017 2239   GLUCOSEU NEGATIVE 12/12/2014 0929   HGBUR SMALL (A) 12/26/2017 2239   BILIRUBINUR NEGATIVE 12/26/2017 2239   KETONESUR NEGATIVE 12/26/2017 2239   PROTEINUR NEGATIVE 12/26/2017 2239   UROBILINOGEN 0.2 05/25/2015 1531   NITRITE NEGATIVE 12/26/2017 2239   LEUKOCYTESUR LARGE (A) 12/26/2017 2239   Sepsis Labs: @LABRCNTIP (procalcitonin:4,lacticidven:4) )No results found for this or any previous visit (from the past 240 hour(s)).   Radiological Exams on Admission: US Renal  Result Date: 12/27/2017 CLINICAL DATA:  Acute onset of elevated creatinine. EXAM: RENAL / URINARY TRACT ULTRASOUND COMPLETE COMPARISON:  None. FINDINGS: Right Kidney: Length: 8.9 cm. Mildly increased parenchymal echogenicity is noted. Mild renal cortical thinning is noted. No mass or hydronephrosis visualized. Left Kidney: Length: 10.5 cm. Mildly increased parenchymal echogenicity is noted. Mild renal cortical thinning is noted. A small 1.3 cm cyst is noted near the upper pole of the left kidney. Mild to moderate left-sided hydronephrosis is noted. Bladder: There is suggestion of asymmetric left posterior bladder wall thickening. This may reflect inflammation, though a mass cannot be entirely excluded. A right-sided ureteral jet is visualized. Small right and trace left pleural effusions are noted. IMPRESSION: 1. Mild-to-moderate left-sided hydronephrosis. No ureteral jet is seen on  the left. This is concerning for distal obstruction. 2. Suggestion of asymmetric left posterior bladder wall thickening. This may reflect inflammation, though a mass cannot be entirely excluded. Cystoscopy could be considered for further evaluation, as deemed clinically appropriate. 3. Mild renal cortical thinning raises concern for chronic renal disease. 4. Mildly increased renal parenchymal echogenicity raises concern for medical renal disease.  5. Small right and trace left pleural effusions noted. Electronically Signed   By: Garald Balding M.D.   On: 12/27/2017 01:46     Assessment/Plan Principal Problem:   Pyelonephritis Active Problems:   Hypothyroidism   Controlled diabetes mellitus with diabetic neuropathy, with long-term current use of insulin (HCC)   Mobitz type 1 second degree AV block   ARF (acute renal failure) ([place])    1. Acute pyelonephritis -patient is placed on ceftriaxone follow cultures. 2. Left-sided hydronephrosis with possible distal obstruction -I have discussed with Dr. Lovena Neighbours on-call urologist who advised to go to CT scan to rule out any definite obstruction.  If there is different obstruction to reconsult urology. 3. Acute renal failure on chronic kidney disease -likely from nausea vomiting and possible obstruction.  Patient is placed on gentle hydration and holding of patient's diuretics.  Closely follow intake output and metabolic panel. 4. Diabetes mellitus type 2 uncontrolled -since patient is having acute renal failure will hold oral hypoglycemics and I have placed patient on sliding scale coverage and gentle hydration.  Follow CBGs closely. 5. Hypertension -we will continue amlodipine but hold hydrochlorothiazide due to acute renal failure.  I have placed patient on as needed IV hydralazine. 6. Hypothyroidism on Synthroid.   DVT prophylaxis: SCDs until no definite planned procedure. Code Status: DNR as requested by patient. Family Communication: Discussed with  patient. Disposition Plan: Back to the facility. Consults called: Discussed with urologist. Admission status: Observation.   Rise Patience MD [facility] Pager [phone].  If 7PM-7AM, please contact night-coverage [email or web address] Password [email or web address]  12/27/2017, 4:01 AM

## 2017-12-27 NOTE — Progress Notes (Signed)
PROGRESS NOTE    [name]  QPY:[id number] DOB: September 25, 1915 DOA: 12/26/2017 PCP: Leanna Battles, MD    Brief Narrative:  83 y.o. female with history of diabetes mellitus type 2, hypothyroidism, hypertension, hyperlipidemia was referred to the ER after patient had some abdominal discomfort with nausea vomiting.  Patient is a poor historian and most of the history was obtained from the ER physician.  Patient blood sugar also was found to be elevated.  Patient did not complain of any chest pain or shortness of breath.  In the ER labs revealed acute renal failure and also UA is consistent with UTI.  Patient was started on ceftriaxone for UTI and renal sonogram was ordered to rule out any obstruction.  Renal sonogram was showing left-sided moderate hydronephrosis with possible distal obstruction   Assessment & Plan:   Principal Problem:   Pyelonephritis Active Problems:   Hypothyroidism   Controlled diabetes mellitus with diabetic neuropathy, with long-term current use of insulin (HCC)   Mobitz type 1 second degree AV block   ARF (acute renal failure) ([place])  1. Acute pyelonephritis -patient had been continued on ceftriaxone. Continue to follow cultures. 2. Left-sided hydronephrosis with possible distal obstruction -Dr. Hal Hope had discussed with Dr. Lovena Neighbours on-call urologist who advised to go to CT scan. CT reviewed. No evidence of obstruction. Findings suggestive of UTI 3. Acute renal failure on chronic kidney disease -likely from nausea vomiting and possible obstruction.  Patient is placed on gentle hydration and holding of patient's diuretics.  Repeat bmet in AM 4. Diabetes mellitus type 2 uncontrolled -since patient is having acute renal failure will hold oral hypoglycemics. Continue SSI coverage. 5. Hypertension -Pt has been continued on amlodipine. Hydrochlorothiazide placed on hold due to acute renal failure.  Continue PRN IV hydralazine. 6. Hypothyroidism on Synthroid.  Stable  DVT prophylaxis: SCD's Code Status: DNR Family Communication: Pt in room, family not at bedside Disposition Plan: Uncertain at this time  Consultants:     Procedures:     Antimicrobials: Anti-infectives (From admission, onward)   Start     Dose/Rate Route Frequency Ordered Stop   12/27/17 2300  cefTRIAXone (ROCEPHIN) 1 g in dextrose 5 % 50 mL IVPB     1 g 100 mL/hr over 30 Minutes Intravenous Every 24 hours 12/27/17 0626     12/26/17 2330  cefTRIAXone (ROCEPHIN) 1 g in dextrose 5 % 50 mL IVPB     1 g 100 mL/hr over 30 Minutes Intravenous  Once 12/26/17 2316 12/27/17 0011       Subjective: Reports feeling well at this time  Objective: Vitals:   12/27/17 0341 12/27/17 0520 12/27/17 1359 12/27/17 1500  BP: (!) 186/74 (!) 158/72 (!) 178/55   Pulse: 67 66 66   Resp: 16  18   Temp: 97.6 F (36.4 C)  97.6 F (36.4 C)   TempSrc: Oral  Oral   SpO2: 93%  98%   Weight: 66.7 kg (147 lb 0.8 oz)     Height:    5\' 7"  (1.702 m)    Intake/Output Summary (Last 24 hours) at 12/27/2017 1654 Last data filed at 12/27/2017 1600 Gross per 24 hour  Intake 2081 ml  Output 500 ml  Net 1581 ml   Filed Weights   12/27/17 0341  Weight: 66.7 kg (147 lb 0.8 oz)    Examination: General exam: Appears calm and comfortable  Respiratory system: Clear to auscultation. Respiratory effort normal. Cardiovascular system: S1 & S2 heard, RRR Gastrointestinal system: Abdomen  is nondistended, soft and nontender. No organomegaly or masses felt. Normal bowel sounds heard. Central nervous system: Alert and oriented. No focal neurological deficits. Extremities: Symmetric 5 x 5 power. Skin: No rashes, lesions Psychiatry: Judgement and insight appear normal. Mood & affect appropriate.   Data Reviewed: I have personally reviewed following labs and imaging studies  CBC: Recent Labs  Lab 12/26/17 2117 01/18/2018 0622  WBC 11.3* 11.8*  NEUTROABS 9.0*  --   HGB 12.0 12.4  HCT 37.1 38.4    MCV 97.4 98.0  PLT 194 259   Basic Metabolic Panel: Recent Labs  Lab 12/26/17 2117 01/18/2018 0622  NA 137 137  K 5.1 4.6  CL 100* 101  CO2 24 21*  GLUCOSE 304* 275*  BUN 60* 52*  CREATININE 3.32* 2.92*  CALCIUM 9.1 8.9   GFR: Estimated Creatinine Clearance: 10 mL/min (A) (by C-G formula based on SCr of 2.92 mg/dL (H)). Liver Function Tests: Recent Labs  Lab 12/26/17 2117  AST 26  ALT 13*  ALKPHOS 64  BILITOT 1.2  PROT 6.7  ALBUMIN 3.3*   Recent Labs  Lab 12/26/17 2117  LIPASE 21   No results for input(s): AMMONIA in the last 168 hours. Coagulation Profile: No results for input(s): INR, PROTIME in the last 168 hours. Cardiac Enzymes: No results for input(s): CKTOTAL, CKMB, CKMBINDEX, TROPONINI in the last 168 hours. BNP (last 3 results) No results for input(s): PROBNP in the last 8760 hours. HbA1C: No results for input(s): HGBA1C in the last 72 hours. CBG: Recent Labs  Lab 12/26/17 2137 01-18-2018 0305 2018-01-18 1003 01-18-2018 1207  GLUCAP 269* 276* 285* 229*   Lipid Profile: No results for input(s): CHOL, HDL, LDLCALC, TRIG, CHOLHDL, LDLDIRECT in the last 72 hours. Thyroid Function Tests: No results for input(s): TSH, T4TOTAL, FREET4, T3FREE, THYROIDAB in the last 72 hours. Anemia Panel: No results for input(s): VITAMINB12, FOLATE, FERRITIN, TIBC, IRON, RETICCTPCT in the last 72 hours. Sepsis Labs: No results for input(s): PROCALCITON, LATICACIDVEN in the last 168 hours.  Recent Results (from the past 240 hour(s))  MRSA PCR Screening     Status: None   Collection Time: 01/18/2018  4:17 AM  Result Value Ref Range Status   MRSA by PCR NEGATIVE NEGATIVE Final    Comment:        The GeneXpert MRSA Assay (FDA approved for NASAL specimens only), is one component of a comprehensive MRSA colonization surveillance program. It is not intended to diagnose MRSA infection nor to guide or monitor treatment for MRSA infections.      Radiology Studies: US  Renal  Result Date: 2018/01/18 CLINICAL DATA:  Acute onset of elevated creatinine. EXAM: RENAL / URINARY TRACT ULTRASOUND COMPLETE COMPARISON:  None. FINDINGS: Right Kidney: Length: 8.9 cm. Mildly increased parenchymal echogenicity is noted. Mild renal cortical thinning is noted. No mass or hydronephrosis visualized. Left Kidney: Length: 10.5 cm. Mildly increased parenchymal echogenicity is noted. Mild renal cortical thinning is noted. A small 1.3 cm cyst is noted near the upper pole of the left kidney. Mild to moderate left-sided hydronephrosis is noted. Bladder: There is suggestion of asymmetric left posterior bladder wall thickening. This may reflect inflammation, though a mass cannot be entirely excluded. A right-sided ureteral jet is visualized. Small right and trace left pleural effusions are noted. IMPRESSION: 1. Mild-to-moderate left-sided hydronephrosis. No ureteral jet is seen on the left. This is concerning for distal obstruction. 2. Suggestion of asymmetric left posterior bladder wall thickening. This may reflect inflammation, though a  mass cannot be entirely excluded. Cystoscopy could be considered for further evaluation, as deemed clinically appropriate. 3. Mild renal cortical thinning raises concern for chronic renal disease. 4. Mildly increased renal parenchymal echogenicity raises concern for medical renal disease. 5. Small right and trace left pleural effusions noted. Electronically Signed   By: Garald Balding M.D.   On: 12/27/2017 01:46   Ct Renal Stone Study  Result Date: 12/27/2017 CLINICAL DATA:  83 y/o  F; flank pain. EXAM: CT ABDOMEN AND PELVIS WITHOUT CONTRAST TECHNIQUE: Multidetector CT imaging of the abdomen and pelvis was performed following the standard protocol without IV contrast. COMPARISON:  12/27/2017 renal ultrasound. FINDINGS: Lower chest: Moderate to severe coronary artery calcification. Multiple calcified nodules in the lung bases. Trace right and small left pleural  effusions. Moderate hiatal hernia. Hepatobiliary: No focal liver lesion. Cholelithiasis. No biliary ductal dilatation. Pancreas: Unremarkable. No pancreatic ductal dilatation or surrounding inflammatory changes. Spleen: Normal in size without focal abnormality. Adrenals/Urinary Tract: Normal adrenal glands. No focal kidney lesion. Normal right ureter. Moderate left hydronephrosis and perinephric stranding. No obstructing stone identified. Mild diffuse bladder wall thickening. Stomach/Bowel: Stomach is within normal limits. Appendix appears normal. No evidence of bowel wall thickening, distention, or inflammatory changes. Vascular/Lymphatic: Aortic atherosclerosis. No enlarged abdominal or pelvic lymph nodes. Reproductive: Uterus and bilateral adnexa are unremarkable. Small calcified myoma within uterine fundus. Other: No abdominal wall hernia or abnormality. No abdominopelvic ascites. Right iliopsoas atrophy. Musculoskeletal: Moderate lumbar levocurvature. Bilateral proximal femur fixation hardware. Chronic fracture deformity of left superior pubic ramus. No acute fracture identified. Moderate lumbar spondylosis. IMPRESSION: 1. Moderate left hydronephrosis and perinephric stranding. No obstructing stone identified. Findings may represent infection of the renal collecting system or recently passed stone. 2. Mild bladder wall thickening, possibly cystitis. 3. Coronary artery and aortic calcific atherosclerosis. 4. Moderate hiatal hernia. 5. Trace right and small left pleural effusions. Electronically Signed   By: Kristine Garbe M.D.   On: 12/27/2017 05:10    Scheduled Meds: . amLODipine  2.5 mg Oral Daily  . ferrous sulfate  325 mg Oral Q breakfast  . insulin aspart  0-9 Units Subcutaneous TID WC  . ipratropium  2 spray Each Nare TID AC  . levothyroxine  88 mcg Oral QAC breakfast  . pantoprazole  40 mg Oral BID  . pravastatin  20 mg Oral Daily  . pregabalin  100 mg Oral Q24H  . senna-docusate  1  tablet Oral Daily   Continuous Infusions: . cefTRIAXone (ROCEPHIN)  IV       LOS: 0 days   Marylu Lund, MD [facility] Pager [phone]  If 7PM-7AM, please contact night-coverage [email or web address] Password [facility] 12/27/2017, 4:54 PM

## 2017-12-27 NOTE — ED Notes (Signed)
Patient transported to Ultrasound 

## 2017-12-28 DIAGNOSIS — B9620 Unspecified Escherichia coli [E. coli] as the cause of diseases classified elsewhere: Secondary | ICD-10-CM | POA: Diagnosis present

## 2017-12-28 DIAGNOSIS — N1330 Unspecified hydronephrosis: Secondary | ICD-10-CM | POA: Diagnosis not present

## 2017-12-28 DIAGNOSIS — Z794 Long term (current) use of insulin: Secondary | ICD-10-CM | POA: Diagnosis not present

## 2017-12-28 DIAGNOSIS — E1122 Type 2 diabetes mellitus with diabetic chronic kidney disease: Secondary | ICD-10-CM | POA: Diagnosis present

## 2017-12-28 DIAGNOSIS — R7989 Other specified abnormal findings of blood chemistry: Secondary | ICD-10-CM | POA: Diagnosis not present

## 2017-12-28 DIAGNOSIS — E039 Hypothyroidism, unspecified: Secondary | ICD-10-CM | POA: Diagnosis present

## 2017-12-28 DIAGNOSIS — N183 Chronic kidney disease, stage 3 (moderate): Secondary | ICD-10-CM | POA: Diagnosis present

## 2017-12-28 DIAGNOSIS — R4182 Altered mental status, unspecified: Secondary | ICD-10-CM | POA: Diagnosis not present

## 2017-12-28 DIAGNOSIS — N136 Pyonephrosis: Secondary | ICD-10-CM | POA: Diagnosis present

## 2017-12-28 DIAGNOSIS — N179 Acute kidney failure, unspecified: Secondary | ICD-10-CM | POA: Diagnosis present

## 2017-12-28 DIAGNOSIS — N390 Urinary tract infection, site not specified: Secondary | ICD-10-CM | POA: Diagnosis not present

## 2017-12-28 DIAGNOSIS — Z66 Do not resuscitate: Secondary | ICD-10-CM | POA: Diagnosis present

## 2017-12-28 DIAGNOSIS — I129 Hypertensive chronic kidney disease with stage 1 through stage 4 chronic kidney disease, or unspecified chronic kidney disease: Secondary | ICD-10-CM | POA: Diagnosis present

## 2017-12-28 DIAGNOSIS — N12 Tubulo-interstitial nephritis, not specified as acute or chronic: Secondary | ICD-10-CM | POA: Diagnosis not present

## 2017-12-28 DIAGNOSIS — E785 Hyperlipidemia, unspecified: Secondary | ICD-10-CM | POA: Diagnosis present

## 2017-12-28 DIAGNOSIS — E1165 Type 2 diabetes mellitus with hyperglycemia: Secondary | ICD-10-CM | POA: Diagnosis present

## 2017-12-28 DIAGNOSIS — N139 Obstructive and reflux uropathy, unspecified: Secondary | ICD-10-CM | POA: Diagnosis not present

## 2017-12-28 DIAGNOSIS — Z79899 Other long term (current) drug therapy: Secondary | ICD-10-CM | POA: Diagnosis not present

## 2017-12-28 DIAGNOSIS — I441 Atrioventricular block, second degree: Secondary | ICD-10-CM | POA: Diagnosis present

## 2017-12-28 DIAGNOSIS — Z888 Allergy status to other drugs, medicaments and biological substances status: Secondary | ICD-10-CM | POA: Diagnosis not present

## 2017-12-28 DIAGNOSIS — N10 Acute tubulo-interstitial nephritis: Secondary | ICD-10-CM | POA: Diagnosis present

## 2017-12-28 DIAGNOSIS — E1140 Type 2 diabetes mellitus with diabetic neuropathy, unspecified: Secondary | ICD-10-CM | POA: Diagnosis present

## 2017-12-28 LAB — CBC
HCT: 38.8 % (ref 36.0–46.0)
HEMOGLOBIN: 12.5 g/dL (ref 12.0–15.0)
MCH: 31.7 pg (ref 26.0–34.0)
MCHC: 32.2 g/dL (ref 30.0–36.0)
MCV: 98.5 fL (ref 78.0–100.0)
Platelets: 167 10*3/uL (ref 150–400)
RBC: 3.94 MIL/uL (ref 3.87–5.11)
RDW: 13.2 % (ref 11.5–15.5)
WBC: 13.4 10*3/uL — AB (ref 4.0–10.5)

## 2017-12-28 LAB — BASIC METABOLIC PANEL
ANION GAP: 12 (ref 5–15)
BUN: 45 mg/dL — ABNORMAL HIGH (ref 6–20)
CALCIUM: 8.7 mg/dL — AB (ref 8.9–10.3)
CO2: 21 mmol/L — ABNORMAL LOW (ref 22–32)
Chloride: 102 mmol/L (ref 101–111)
Creatinine, Ser: 2.62 mg/dL — ABNORMAL HIGH (ref 0.44–1.00)
GFR, EST AFRICAN AMERICAN: 16 mL/min — AB (ref >60)
GFR, EST NON AFRICAN AMERICAN: 14 mL/min — AB (ref >60)
GLUCOSE: 214 mg/dL — AB (ref 65–99)
Potassium: 4.2 mmol/L (ref 3.5–5.1)
SODIUM: 135 mmol/L (ref 135–145)

## 2017-12-28 LAB — GLUCOSE, CAPILLARY
GLUCOSE-CAPILLARY: 180 mg/dL — AB (ref 65–99)
GLUCOSE-CAPILLARY: 305 mg/dL — AB (ref 65–99)
GLUCOSE-CAPILLARY: 272 mg/dL — AB (ref 65–99)
Glucose-Capillary: 162 mg/dL — ABNORMAL HIGH (ref 65–99)

## 2017-12-28 MED ORDER — AMLODIPINE BESYLATE 5 MG PO TABS
5.0000 mg | ORAL_TABLET | Freq: Every day | ORAL | 0 refills | Status: AC
Start: 2017-12-28 — End: ?

## 2017-12-28 MED ORDER — METOPROLOL TARTRATE 25 MG PO TABS: 25.0000 mg | ORAL_TABLET | Freq: Two times a day (BID) | ORAL | 11 refills | Status: AC

## 2017-12-28 MED ORDER — DOCUSATE SODIUM 100 MG PO CAPS: 100.0000 mg | ORAL_CAPSULE | Freq: Two times a day (BID) | ORAL | 0 refills | Status: AC

## 2017-12-28 MED ORDER — CEFUROXIME AXETIL 250 MG PO TABS: 250.0000 mg | ORAL_TABLET | Freq: Two times a day (BID) | ORAL | 0 refills | Status: AC

## 2017-12-28 MED ORDER — BISACODYL 10 MG RE SUPP: 10.0000 mg | Freq: Once | RECTAL | Status: DC

## 2017-12-28 MED ORDER — LACTULOSE 10 GM/15ML PO SOLN
30.0000 g | ORAL | Status: AC
  Administered 2017-12-28: 30 g via ORAL
  Filled 2017-12-28: qty 45

## 2017-12-28 NOTE — Clinical Social Work Note (Signed)
Clinical Social Work Assessment  Patient Details  Name: [name] MRN: [id number] Date of Birth: [birth year]  Date of referral:  12/28/17               Reason for consult:  Discharge Planning                Permission sought to share information with:  Facility Sport and exercise psychologist, Family Supports Permission granted to share information::  Yes, Verbal Permission Granted  Name::     Nutritional therapist  Agency::  [facility]  Relationship::  Son  Contact Information:  [phone]  Housing/Transportation Living arrangements for the past 2 months:  [place] of Information:  Patient, Medical Team, Adult Children, Facility Patient Interpreter Needed:  None Criminal Activity/Legal Involvement Pertinent to Current Situation/Hospitalization:  No - Comment as needed Significant Relationships:  Adult Children Lives with:  Facility Resident Do you feel safe going back to the place where you live?  Yes Need for family participation in patient care:  Yes (Comment)  Care giving concerns:  Patient is a resident at [facility].   Social Worker assessment / plan:  CSW met with patient. Son at bedside. CSW introduced role and explained that discharge planning would be discussed. Patient and her son confirmed she is from [facility] and the plan is for her to return there today. Patient will need PTAR. The RN at the ALF is reviewing documentation and will call back if anything needs to be changed or if everything looks fine and transport can be arranged. MD will have to sign complete FL2 once everything is approved. No further concerns. CSW encouraged patient and her son to contact CSW as needed. CSW will continue to follow patient and her son for support and facilitate discharge back to ALF once medically stable.  Employment status:  Retired Nurse, adult PT Recommendations:  Home with [place] / Referral to  community resources:  Other (Comment Required)(Plan is to return to ALF.)  Patient/Family's Response to care:  Patient and her son agreeable to return to ALF. Patient's son supportive and involved in patient's care. Patient and her son appreciated social work intervention.  Patient/Family's Understanding of and Emotional Response to Diagnosis, Current Treatment, and Prognosis:  Patient and her son have a good understanding of the reason for admission and the plan to return to ALF today. Patient and her son appear happy with hospital care.  Emotional Assessment Appearance:  Appears stated age Attitude/Demeanor/Rapport:  Other(Pleasant) Affect (typically observed):  Accepting, Appropriate, Calm, Pleasant Orientation:  Oriented to Self, Oriented to Place, Oriented to  Time, Oriented to Situation Alcohol / Substance use:  Never Used Psych involvement (Current and /or in the community):  No (Comment)  Discharge Needs  Concerns to be addressed:  Care Coordination Readmission within the last 30 days:  No Current discharge risk:  None Barriers to Discharge:  Other(ALF is reviewing FL2 and discharge summary)   Candie Chroman, LCSW 12/28/2017, 5:14 PM

## 2017-12-28 NOTE — Discharge Summary (Addendum)
Physician Discharge Summary  [name] TKZ:[id number] DOB: June 09, 1915 DOA: 12/26/2017  PCP: Leanna Battles, MD  Admit date: 12/26/2017 Discharge date: 12/28/2017  Admitted From: ALF Disposition:  ALF  Recommendations for Outpatient Follow-up:  1. Follow up with PCP in 1-2 weeks 2. Follow up with Dr. Lovena Neighbours (Urology) as scheduled in 2 weeks 3. Please perform blood pressure check within one week and titrate BP meds accordingly 4. Recommend repeat BMET within one week, focus on renal function 5. Please follow up final sensitivities to urine culture from 12/28, thus far growing >100,000 ecoli, clinically improved with rocephin 6. Please check blood glucose before meals  Home Health:PT   Discharge Condition:Stable CODE STATUS:DNR Diet recommendation: Diabetic   Brief/Interim Summary: 83 y.o.femalewithhistory of diabetes mellitus type 2, hypothyroidism, hypertension, hyperlipidemia was referred to the ER after patient had some abdominal discomfort with nausea vomiting. Patient is a poor historian and most of the history was obtained from the ER physician. Patient blood sugar also was found to be elevated. Patient did not complain of any chest pain or shortness of breath. In the ER labs revealed acute renal failure and also UA is consistent with UTI. Patient was started on ceftriaxone for UTI and renal sonogram was ordered to rule out any obstruction. Renal sonogram was showing left-sided moderate hydronephrosis with possible distal obstruction   1. Acute pyelonephritis-patient had been continued on ceftriaxone. Urine cx notable for ecoli, clinically responding to rocephin. Sensitivities pending. Will defer follow up of sensitivity to PCP/Urology 2. Left-sided hydronephrosis with possible distal obstruction-Dr. Hal Hope had discussed with Dr. Lovena Neighbours on-call urologist who advised to go to CT scan. CT reviewed. No evidence of obstruction. Findings suggestive of UTI. Urology  input noted. Patient declines further urologic work up at this time. Patient to follow up with Urology in 2 weeks for repeat RUS and to monitor UTI 3. Acute renal failure on chronic kidney disease-likely from nausea vomiting and possible obstruction. Improved with gentle hydration and holding of patient's diuretics.  4. Diabetes mellitus type 2 uncontrolled-since patient had acute renal failure at presentation, temporarily held oral hypoglycemics. Continued SSI coverage while inpatient 5. Hypertension-Pt has been continued on amlodipine. Hydrochlorothiazide placed on hold due to acute renal failure. BP suboptimal. Continue to hold diuretics on discharge. Increase amlodipine to 5 mg and add metoprolol on discharge. 6. Hypothyroidism on Synthroid. Stable    Discharge Diagnoses:  Principal Problem:   Pyelonephritis Active Problems:   Hypothyroidism   Controlled diabetes mellitus with diabetic neuropathy, with long-term current use of insulin (HCC)   Mobitz type 1 second degree AV block   ARF (acute renal failure) ([place])    Discharge Instructions   Allergies as of 12/28/2017      Reactions   Capsaicin Other (See Comments)   Topical application BURNED the skin      Medication List    STOP taking these medications   furosemide 20 MG tablet Commonly known as:  LASIX   hydrochlorothiazide 25 MG tablet Commonly known as:  HYDRODIURIL     TAKE these medications   amLODipine 5 MG tablet Commonly known as:  NORVASC Take 1 tablet (5 mg total) by mouth daily. What changed:  Another medication with the same name was removed. Continue taking this medication, and follow the directions you see here.   aspirin 81 MG tablet Take 81 mg by mouth daily.   cefUROXime 250 MG tablet Commonly known as:  CEFTIN Take 1 tablet (250 mg total) by mouth 2 (two) times  daily for 5 days.   DAILY-VITE PO Take 1 tablet by mouth daily.   docusate sodium 100 MG capsule Commonly known as:   COLACE Take 1 capsule (100 mg total) by mouth 2 (two) times daily.   ferrous sulfate 325 (65 FE) MG tablet Take 325 mg by mouth daily with breakfast.   glimepiride 2 MG tablet Commonly known as:  AMARYL TAKE 1 TABLET BY MOUTH EVERY MORNING AND 1/2 TABLET AT DINNER What changed:  See the new instructions.   insulin aspart 100 UNIT/ML FlexPen Commonly known as:  NOVOLOG FLEXPEN If blood sugar is over 200, take 5 units and if they go over 300, take 6 units at that meal. What changed:    how much to take  how to take this  when to take this  additional instructions   ipratropium 0.03 % nasal spray Commonly known as:  ATROVENT Place 2 sprays into both nostrils 3 (three) times daily before meals.   pregabalin 100 MG capsule Commonly known as:  LYRICA Take 100 mg by mouth See admin instructions. 100 mg once a day at 7 PM   LYRICA 75 MG capsule Generic drug:  pregabalin TAKE 1 CAPSULE TWICE DAILY   MAPAP 325 MG tablet Generic drug:  acetaminophen Take 650 mg by mouth every 6 (six) hours as needed (for fever).   metoprolol tartrate 25 MG tablet Commonly known as:  LOPRESSOR Take 1 tablet (25 mg total) by mouth 2 (two) times daily.   pantoprazole 40 MG tablet Commonly known as:  PROTONIX TAKE 1 TABLET BY MOUTH TWICE DAILY What changed:    how much to take  how to take this  when to take this   Gibbs Take 1 capsule by mouth daily.   pravastatin 20 MG tablet Commonly known as:  PRAVACHOL Take 20 mg by mouth daily.   sennosides-docusate sodium 8.6-50 MG tablet Commonly known as:  SENOKOT-S Take 1 tablet by mouth daily.   SYNTHROID 88 MCG tablet Generic drug:  levothyroxine TAKE 1 TABLET DAILY What changed:    how much to take  how to take this  when to take this   traMADol 50 MG tablet Commonly known as:  ULTRAM Take 1 tablet (50 mg total) by mouth 2 (two) times daily. What changed:    when to take this  additional  instructions   VENTOLIN HFA 108 (90 Base) MCG/ACT inhaler Generic drug:  albuterol Inhale 2 puffs into the lungs every 4 (four) hours as needed for wheezing or shortness of breath.   vitamin B-12 1000 MCG tablet Commonly known as:  CYANOCOBALAMIN Take 1,000 mcg by mouth daily.      Follow-up Information    Ceasar Mons, MD Follow up in 3 week(s).   Specialty:  Urology Contact information: [address] [place] [address] [phone]        Follow up Follow up.   Why:  f/u left hydronephrosis       Leanna Battles, MD. Schedule an appointment as soon as possible for a visit in 1 week(s).   Specialty:  Internal Medicine Contact information: [place] [address] [phone]          Allergies  Allergen Reactions  . Capsaicin Other (See Comments)    Topical application BURNED the skin    Consultations:  Urology, Dr. Lovena Neighbours  Procedures/Studies: US Renal  Result Date: 12/27/2017 CLINICAL DATA:  Acute onset of elevated creatinine. EXAM:  RENAL / URINARY TRACT ULTRASOUND COMPLETE COMPARISON:  None. FINDINGS: Right Kidney: Length: 8.9 cm. Mildly increased parenchymal echogenicity is noted. Mild renal cortical thinning is noted. No mass or hydronephrosis visualized. Left Kidney: Length: 10.5 cm. Mildly increased parenchymal echogenicity is noted. Mild renal cortical thinning is noted. A small 1.3 cm cyst is noted near the upper pole of the left kidney. Mild to moderate left-sided hydronephrosis is noted. Bladder: There is suggestion of asymmetric left posterior bladder wall thickening. This may reflect inflammation, though a mass cannot be entirely excluded. A right-sided ureteral jet is visualized. Small right and trace left pleural effusions are noted. IMPRESSION: 1. Mild-to-moderate left-sided hydronephrosis. No ureteral jet is seen on the left. This is concerning for distal obstruction. 2. Suggestion of asymmetric left  posterior bladder wall thickening. This may reflect inflammation, though a mass cannot be entirely excluded. Cystoscopy could be considered for further evaluation, as deemed clinically appropriate. 3. Mild renal cortical thinning raises concern for chronic renal disease. 4. Mildly increased renal parenchymal echogenicity raises concern for medical renal disease. 5. Small right and trace left pleural effusions noted. Electronically Signed   By: Garald Balding M.D.   On: 12/27/2017 01:46   Ct Renal Stone Study  Result Date: 12/27/2017 CLINICAL DATA:  83 y/o  F; flank pain. EXAM: CT ABDOMEN AND PELVIS WITHOUT CONTRAST TECHNIQUE: Multidetector CT imaging of the abdomen and pelvis was performed following the standard protocol without IV contrast. COMPARISON:  12/27/2017 renal ultrasound. FINDINGS: Lower chest: Moderate to severe coronary artery calcification. Multiple calcified nodules in the lung bases. Trace right and small left pleural effusions. Moderate hiatal hernia. Hepatobiliary: No focal liver lesion. Cholelithiasis. No biliary ductal dilatation. Pancreas: Unremarkable. No pancreatic ductal dilatation or surrounding inflammatory changes. Spleen: Normal in size without focal abnormality. Adrenals/Urinary Tract: Normal adrenal glands. No focal kidney lesion. Normal right ureter. Moderate left hydronephrosis and perinephric stranding. No obstructing stone identified. Mild diffuse bladder wall thickening. Stomach/Bowel: Stomach is within normal limits. Appendix appears normal. No evidence of bowel wall thickening, distention, or inflammatory changes. Vascular/Lymphatic: Aortic atherosclerosis. No enlarged abdominal or pelvic lymph nodes. Reproductive: Uterus and bilateral adnexa are unremarkable. Small calcified myoma within uterine fundus. Other: No abdominal wall hernia or abnormality. No abdominopelvic ascites. Right iliopsoas atrophy. Musculoskeletal: Moderate lumbar levocurvature. Bilateral proximal femur  fixation hardware. Chronic fracture deformity of left superior pubic ramus. No acute fracture identified. Moderate lumbar spondylosis. IMPRESSION: 1. Moderate left hydronephrosis and perinephric stranding. No obstructing stone identified. Findings may represent infection of the renal collecting system or recently passed stone. 2. Mild bladder wall thickening, possibly cystitis. 3. Coronary artery and aortic calcific atherosclerosis. 4. Moderate hiatal hernia. 5. Trace right and small left pleural effusions. Electronically Signed   By: Kristine Garbe M.D.   On: 12/27/2017 05:10    Subjective: Eager to be discharged today  Discharge Exam: Vitals:   12/28/17 0528 12/28/17 1505  BP: (!) 170/75 (!) 158/62  Pulse: 79 85  Resp: 16 16  Temp: 99.4 F (37.4 C) (!) 97.4 F (36.3 C)  SpO2: 90% 96%   Vitals:   12/27/17 2122 12/28/17 0408 12/28/17 0528 12/28/17 1505  BP: (!) 170/58  (!) 170/75 (!) 158/62  Pulse: 70  79 85  Resp: 17  16 16   Temp: 98.3 F (36.8 C)  99.4 F (37.4 C) (!) 97.4 F (36.3 C)  TempSrc: Oral  Oral Oral  SpO2: 94%  90% 96%  Weight:  68.7 kg (151 lb 7.3 oz)  Height:        General: Pt is alert, awake, not in acute distress Cardiovascular: RRR, S1/S2 +, no rubs, no gallops Respiratory: CTA bilaterally, no wheezing, no rhonchi Abdominal: Soft, NT, ND, bowel sounds + Extremities: no edema, no cyanosis   The results of significant diagnostics from this hospitalization (including imaging, microbiology, ancillary and laboratory) are listed below for reference.     Microbiology: Recent Results (from the past 240 hour(s))  Urine culture     Status: Abnormal (Preliminary result)   Collection Time: 12/26/17 10:42 PM  Result Value Ref Range Status   Specimen Description URINE, CLEAN CATCH  Final   Special Requests NONE  Final   Culture >=100,000 COLONIES/mL ESCHERICHIA COLI (A)  Final   Report Status PENDING  Incomplete  MRSA PCR Screening     Status: None    Collection Time: 12/27/17  4:17 AM  Result Value Ref Range Status   MRSA by PCR NEGATIVE NEGATIVE Final    Comment:        The GeneXpert MRSA Assay (FDA approved for NASAL specimens only), is one component of a comprehensive MRSA colonization surveillance program. It is not intended to diagnose MRSA infection nor to guide or monitor treatment for MRSA infections.      Labs: BNP (last 3 results) No results for input(s): BNP in the last 8760 hours. Basic Metabolic Panel: Recent Labs  Lab 12/26/17 2117 12/27/17 0622 12/28/17 0415  NA 137 137 135  K 5.1 4.6 4.2  CL 100* 101 102  CO2 24 21* 21*  GLUCOSE 304* 275* 214*  BUN 60* 52* 45*  CREATININE 3.32* 2.92* 2.62*  CALCIUM 9.1 8.9 8.7*   Liver Function Tests: Recent Labs  Lab 12/26/17 2117  AST 26  ALT 13*  ALKPHOS 64  BILITOT 1.2  PROT 6.7  ALBUMIN 3.3*   Recent Labs  Lab 12/26/17 2117  LIPASE 21   No results for input(s): AMMONIA in the last 168 hours. CBC: Recent Labs  Lab 12/26/17 2117 12/27/17 0622 12/28/17 0415  WBC 11.3* 11.8* 13.4*  NEUTROABS 9.0*  --   --   HGB 12.0 12.4 12.5  HCT 37.1 38.4 38.8  MCV 97.4 98.0 98.5  PLT 194 164 167   Cardiac Enzymes: No results for input(s): CKTOTAL, CKMB, CKMBINDEX, TROPONINI in the last 168 hours. BNP: Invalid input(s): POCBNP CBG: Recent Labs  Lab 12/27/17 1003 12/27/17 1207 12/27/17 2117 12/28/17 0744 12/28/17 1214  GLUCAP 285* 229* 246* 180* 305*   D-Dimer No results for input(s): DDIMER in the last 72 hours. Hgb A1c No results for input(s): HGBA1C in the last 72 hours. Lipid Profile No results for input(s): CHOL, HDL, LDLCALC, TRIG, CHOLHDL, LDLDIRECT in the last 72 hours. Thyroid function studies No results for input(s): TSH, T4TOTAL, T3FREE, THYROIDAB in the last 72 hours.  Invalid input(s): FREET3 Anemia work up No results for input(s): VITAMINB12, FOLATE, FERRITIN, TIBC, IRON, RETICCTPCT in the last 72 hours. Urinalysis     Component Value Date/Time   COLORURINE YELLOW 12/26/2017 2239   APPEARANCEUR HAZY (A) 12/26/2017 2239   LABSPEC 1.009 12/26/2017 2239   PHURINE 6.0 12/26/2017 2239   GLUCOSEU >=500 (A) 12/26/2017 2239   GLUCOSEU NEGATIVE 12/12/2014 0929   HGBUR SMALL (A) 12/26/2017 2239   BILIRUBINUR NEGATIVE 12/26/2017 2239   KETONESUR NEGATIVE 12/26/2017 2239   PROTEINUR NEGATIVE 12/26/2017 2239   UROBILINOGEN 0.2 05/25/2015 1531   NITRITE NEGATIVE 12/26/2017 2239   LEUKOCYTESUR LARGE (A) 12/26/2017 2239  Sepsis Labs Invalid input(s): PROCALCITONIN,  WBC,  LACTICIDVEN Microbiology Recent Results (from the past 240 hour(s))  Urine culture     Status: Abnormal (Preliminary result)   Collection Time: 12/26/17 10:42 PM  Result Value Ref Range Status   Specimen Description URINE, CLEAN CATCH  Final   Special Requests NONE  Final   Culture >=100,000 COLONIES/mL ESCHERICHIA COLI (A)  Final   Report Status PENDING  Incomplete  MRSA PCR Screening     Status: None   Collection Time: 12/27/17  4:17 AM  Result Value Ref Range Status   MRSA by PCR NEGATIVE NEGATIVE Final    Comment:        The GeneXpert MRSA Assay (FDA approved for NASAL specimens only), is one component of a comprehensive MRSA colonization surveillance program. It is not intended to diagnose MRSA infection nor to guide or monitor treatment for MRSA infections.      SIGNED:   Marylu Lund, MD  [facility] 12/28/2017, 4:39 PM  If 7PM-7AM, please contact night-coverage [email or web address] Password [email or web address]

## 2017-12-28 NOTE — Progress Notes (Addendum)
Report called to [facility] given to staff all questions and concerns answered. Staff Name is Bome La

## 2017-12-28 NOTE — [place] FL2 (Signed)
[place] MEDICAID FL2 LEVEL OF CARE SCREENING TOOL     IDENTIFICATION  Patient Name: [name] Birthdate: 1915-09-19 Sex: female Admission Date (Current Location): 12/26/2017  [facility] and [place] Number:  Herbalist and Address:  [facility], [place] [address], [place],  [address]      Provider Number: [id number]  Attending Physician Name and Address:  Donne Hazel, MD  Relative Name and Phone Number:       Current Level of Care: Hospital Recommended Level of Care: Assisted Living Facility(with PT) Prior Approval Number:    Date Approved/Denied:   PASRR Number:    Discharge Plan: Other (Comment)(ALF with PT)    Current Diagnoses: Patient Active Problem List   Diagnosis Date Noted  . Pyelonephritis 12/27/2017  . ARF (acute renal failure) ([place]) 12/27/2017  . Corns and callosities 12/17/2016  . Palpitations 05/08/2016  . Mobitz type 1 second degree AV block 05/08/2016  . Trifascicular block 05/08/2016  . Protein-calorie malnutrition, severe 05/01/2016  . Hypertensive urgency 04/30/2016  . Hypertensive urgency, malignant 04/30/2016  . Sepsis due to pneumonia ([place]) 12/06/2015  . Acute respiratory failure with hypoxia ([place]) 12/06/2015  . Controlled diabetes mellitus with diabetic neuropathy, with long-term current use of insulin ([place]) 12/06/2015  . Dyslipidemia associated with type 2 diabetes mellitus ([place]) 12/06/2015  . Leukocytosis 12/06/2015  . Thrombocytopenia ([place]) 12/06/2015  . COPD exacerbation ([place]) 12/06/2015  . Cerebellar stroke ([place]) 03/06/2015  . Chronic kidney disease, stage III (moderate) ([place]) 08/11/2013  . Hypothyroidism 03/15/2013  . Diabetic neuropathy ([place]) 03/15/2013    Orientation RESPIRATION BLADDER Height & Weight     Self, Time, Situation, Place  Normal Incontinent Weight: 151 lb 7.3 oz (68.7 kg) Height:  5\' 7"  (170.2 cm)  BEHAVIORAL SYMPTOMS/MOOD NEUROLOGICAL BOWEL NUTRITION STATUS  (None)  (None) Continent Diet(Heart healthy/carb modified)  AMBULATORY STATUS COMMUNICATION OF NEEDS Skin   Limited Assist Verbally Bruising, Other (Comment)(Skin tear.)                       Personal Care Assistance Level of Assistance              Functional Limitations Info  Sight, Hearing, Speech Sight Info: Adequate Hearing Info: Adequate Speech Info: Adequate    SPECIAL CARE FACTORS FREQUENCY  PT (By licensed PT)     PT Frequency: 3 x week              Contractures Contractures Info: Not present    Additional Factors Info  Code Status, Allergies Code Status Info: DNR Allergies Info: Capsaicin           Current Medications (12/28/2017):  This is the current hospital active medication list Current Facility-Administered Medications  Medication Dose Route Frequency Provider Last Rate Last Dose  . acetaminophen (TYLENOL) tablet 650 mg  650 mg Oral Q6H PRN Rise Patience, MD       Or  . acetaminophen (TYLENOL) suppository 650 mg  650 mg Rectal Q6H PRN Rise Patience, MD      . albuterol (PROVENTIL) (2.5 MG/3ML) 0.083% nebulizer solution 3 mL  3 mL Inhalation Q4H PRN Rise Patience, MD      . amLODipine (NORVASC) tablet 2.5 mg  2.5 mg Oral Daily Rise Patience, MD   2.5 mg at 12/28/17 1007  . bisacodyl (DULCOLAX) suppository 10 mg  10 mg Rectal Once Donne Hazel, MD      .  cefTRIAXone (ROCEPHIN) 1 g in dextrose 5 % 50 mL IVPB  1 g Intravenous Q24H Rise Patience, MD   Stopped at 12/28/17 0005  . docusate sodium (COLACE) capsule 100 mg  100 mg Oral BID Donne Hazel, MD   100 mg at 12/28/17 1007  . ferrous sulfate tablet 325 mg  325 mg Oral Q breakfast Rise Patience, MD   325 mg at 12/28/17 1007  . hydrALAZINE (APRESOLINE) injection 10 mg  10 mg Intravenous Q4H PRN Rise Patience, MD   10 mg at 12/27/17 1420  . insulin aspart (novoLOG) injection 0-9 Units  0-9 Units Subcutaneous TID WC Rise Patience, MD   7 Units  at 12/28/17 1252  . ipratropium (ATROVENT) 0.06 % nasal spray 2 spray  2 spray Each Nare TID AC Rise Patience, MD   2 spray at 12/28/17 1253  . levothyroxine (SYNTHROID, LEVOTHROID) tablet 88 mcg  88 mcg Oral QAC breakfast Rise Patience, MD   88 mcg at 12/28/17 1007  . ondansetron (ZOFRAN) tablet 4 mg  4 mg Oral Q6H PRN Rise Patience, MD       Or  . ondansetron [facility]) injection 4 mg  4 mg Intravenous Q6H PRN Rise Patience, MD   4 mg at 12/27/17 1510  . pantoprazole (PROTONIX) EC tablet 40 mg  40 mg Oral BID Rise Patience, MD   40 mg at 12/28/17 1007  . polyethylene glycol (MIRALAX / GLYCOLAX) packet 17 g  17 g Oral Daily Donne Hazel, MD   17 g at 12/28/17 1008  . pravastatin (PRAVACHOL) tablet 20 mg  20 mg Oral Daily Rise Patience, MD   20 mg at 12/28/17 1007  . pregabalin (LYRICA) capsule 100 mg  100 mg Oral Q24H Rise Patience, MD   100 mg at 12/27/17 2003  . senna-docusate (Senokot-S) tablet 1 tablet  1 tablet Oral Daily Rise Patience, MD   1 tablet at 12/28/17 1007     Discharge Medications: STOP taking these medications   furosemide 20 MG tablet Commonly known as:  LASIX   hydrochlorothiazide 25 MG tablet Commonly known as:  HYDRODIURIL     TAKE these medications   amLODipine 5 MG tablet Commonly known as:  NORVASC Take 1 tablet (5 mg total) by mouth daily. What changed:  Another medication with the same name was removed. Continue taking this medication, and follow the directions you see here.   aspirin 81 MG tablet Take 81 mg by mouth daily.   cefUROXime 250 MG tablet Commonly known as:  CEFTIN Take 1 tablet (250 mg total) by mouth 2 (two) times daily for 5 days.   DAILY-VITE PO Take 1 tablet by mouth daily.   docusate sodium 100 MG capsule Commonly known as:  COLACE Take 1 capsule (100 mg total) by mouth 2 (two) times daily.   ferrous sulfate 325 (65 FE) MG tablet Take 325 mg by mouth daily with  breakfast.   glimepiride 2 MG tablet Commonly known as:  AMARYL TAKE 1 TABLET BY MOUTH EVERY MORNING AND 1/2 TABLET AT DINNER What changed:  See the new instructions.   insulin aspart 100 UNIT/ML FlexPen Commonly known as:  NOVOLOG FLEXPEN If blood sugar is over 200, take 5 units and if they go over 300, take 6 units at that meal. What changed:    how much to take  how to take this  when to take this  additional  instructions   ipratropium 0.03 % nasal spray Commonly known as:  ATROVENT Place 2 sprays into both nostrils 3 (three) times daily before meals.   pregabalin 100 MG capsule Commonly known as:  LYRICA Take 100 mg by mouth See admin instructions. 100 mg once a day at 7 PM   LYRICA 75 MG capsule Generic drug:  pregabalin TAKE 1 CAPSULE TWICE DAILY   MAPAP 325 MG tablet Generic drug:  acetaminophen Take 650 mg by mouth every 6 (six) hours as needed (for fever).   metoprolol tartrate 25 MG tablet Commonly known as:  LOPRESSOR Take 1 tablet (25 mg total) by mouth 2 (two) times daily.   pantoprazole 40 MG tablet Commonly known as:  PROTONIX TAKE 1 TABLET BY MOUTH TWICE DAILY What changed:    how much to take  how to take this  when to take this   [place] Take 1 capsule by mouth daily.   pravastatin 20 MG tablet Commonly known as:  PRAVACHOL Take 20 mg by mouth daily.   sennosides-docusate sodium 8.6-50 MG tablet Commonly known as:  SENOKOT-S Take 1 tablet by mouth daily.   SYNTHROID 88 MCG tablet Generic drug:  levothyroxine TAKE 1 TABLET DAILY What changed:    how much to take  how to take this  when to take this   traMADol 50 MG tablet Commonly known as:  ULTRAM Take 1 tablet (50 mg total) by mouth 2 (two) times daily. What changed:    when to take this  additional instructions   VENTOLIN HFA 108 (90 Base) MCG/ACT inhaler Generic drug:  albuterol Inhale 2 puffs into the lungs every 4 (four) hours  as needed for wheezing or shortness of breath.   vitamin B-12 1000 MCG tablet Commonly known as:  CYANOCOBALAMIN Take 1,000 mcg by mouth daily.      Relevant Imaging Results:  Relevant Lab Results:   Additional Information SS#: [id number]  Candie Chroman, LCSW

## 2017-12-28 NOTE — Care Management Obs Status (Signed)
[place] NOTIFICATION   Patient Details  Name: BRILYN TULLER MRN: [id number] Date of Birth: Jul 19, 1915   Medicare Observation Status Notification Given:  Yes    Carles Collet, RN 12/28/2017, 8:19 AM

## 2017-12-28 NOTE — Consult Note (Signed)
Urology Consult   1. Physician requesting consult: Dr. Hal Hope   Reason for consult: Left hydronephrosis  History of Present Illness: [name] is a pleasant 83 y.o. female with currently being evaluated for a UTI/left pyelonephritis.  She initially presented to the [facility] ED on 12/29 with worsening abdominal pain and N/V for the past 24-48 hours.  Urology was consulted after the patient was found to have left sided hydronephrosis on RUS.  Subsequent CT stone study confirmed left sided hydronephrosis with no signs of nephrolithiasis or a clear etiology for ureteral obstruction.    Currently, the patient states that she is feeling much better and denies a history of voiding or storage urinary symptoms, hematuria, recurrent UTIs, STDs, urolithiasis, GU malignancy/trauma/surgery.   Past Medical History:  Diagnosis Date  . Chronic kidney disease (CKD), stage III (moderate) ([place])    [name] 12/06/2015  . History of blood transfusion    "when my son was born; maybe once since" (04/30/2016)  . Hypertension   . Hypothyroidism   . Pneumonia 1930s; ? 11/2015  . Tuberculosis 1930s  . Type II diabetes mellitus ([place]) since 1964    Past Surgical History:  Procedure Laterality Date  . CATARACT EXTRACTION W/ INTRAOCULAR LENS  IMPLANT, BILATERAL Bilateral   . ESOPHAGOGASTRODUODENOSCOPY N/A 03/03/2015   Procedure: ESOPHAGOGASTRODUODENOSCOPY (EGD);  Surgeon: Inda Castle, MD;  Location: [facility];  Service: Endoscopy;  Laterality: N/A;  . ESOPHAGOGASTRODUODENOSCOPY N/A 03/07/2015   Procedure: ESOPHAGOGASTRODUODENOSCOPY (EGD);  Surgeon: Jerene Bears, MD;  Location: [facility] ENDOSCOPY;  Service: Endoscopy;  Laterality: N/A;  . FRACTURE SURGERY    . HIP FRACTURE SURGERY Bilateral   . TONSILLECTOMY      Current Hospital Medications:  Home Meds:  Current Meds  Medication Sig  . acetaminophen (MAPAP) 325 MG tablet Take 650 mg by mouth every 6 (six) hours as needed (for fever).  Marland Kitchen albuterol (VENTOLIN  HFA) 108 (90 Base) MCG/ACT inhaler Inhale 2 puffs into the lungs every 4 (four) hours as needed for wheezing or shortness of breath.  Marland Kitchen amLODipine (NORVASC) 2.5 MG tablet Take 2.5 mg by mouth daily.  Marland Kitchen aspirin 81 MG tablet Take 81 mg by mouth daily.  . ferrous sulfate 325 (65 FE) MG tablet Take 325 mg by mouth daily with breakfast.  . furosemide (LASIX) 20 MG tablet Take 10 mg by mouth daily.   Marland Kitchen glimepiride (AMARYL) 2 MG tablet TAKE 1 TABLET BY MOUTH EVERY MORNING AND 1/2 TABLET AT DINNER (Patient taking differently: Take 2 mg by mouth in the morning and 1 mg in the evening)  . hydrochlorothiazide (HYDRODIURIL) 25 MG tablet Take 12.5 mg by mouth daily.  . insulin aspart (NOVOLOG FLEXPEN) 100 UNIT/ML FlexPen If blood sugar is over 200, take 5 units and if they go over 300, take 6 units at that meal. (Patient taking differently: Inject 4-8 Units into the skin See admin instructions. 4-8 units three times a day before meals per sliding scale: BGL 150-200 = 4 units; 201-250 = 6 units; >250 = 8 units AND ADD 4 UNITS TO SLIDING SCALE DOSE WITH EVERY BREAKFAST)  . ipratropium (ATROVENT) 0.03 % nasal spray Place 2 sprays into both nostrils 3 (three) times daily before meals.  . Multiple Vitamin (DAILY-VITE PO) Take 1 tablet by mouth daily.  . pantoprazole (PROTONIX) 40 MG tablet TAKE 1 TABLET BY MOUTH TWICE DAILY (Patient taking differently: Take 40 mg by mouth in the morning)  . pravastatin (PRAVACHOL) 20 MG tablet Take 20 mg  by mouth daily.  . pregabalin (LYRICA) 100 MG capsule Take 100 mg by mouth See admin instructions. 100 mg once a day at 7 PM  . Probiotic Product ([place]) CAPS Take 1 capsule by mouth daily.  . sennosides-docusate sodium (SENOKOT-S) 8.6-50 MG tablet Take 1 tablet by mouth daily.   Marland Kitchen SYNTHROID 88 MCG tablet TAKE 1 TABLET DAILY (Patient taking differently: Take 88 mcg by  mouth once a day)  . traMADol (ULTRAM) 50 MG tablet Take 1 tablet (50 mg total) by mouth 2 (two)  times daily. (Patient taking differently: Take 50 mg by mouth See admin instructions. 50 mg once a day AS NEEDED for moderate pain and 50 mg at bedtime SCHEDULED)  . vitamin B-12 (CYANOCOBALAMIN) 1000 MCG tablet Take 1,000 mcg by mouth daily.    Scheduled Meds: . amLODipine  2.5 mg Oral Daily  . bisacodyl  10 mg Rectal Once  . docusate sodium  100 mg Oral BID  . ferrous sulfate  325 mg Oral Q breakfast  . insulin aspart  0-9 Units Subcutaneous TID WC  . ipratropium  2 spray Each Nare TID AC  . levothyroxine  88 mcg Oral QAC breakfast  . pantoprazole  40 mg Oral BID  . polyethylene glycol  17 g Oral Daily  . pravastatin  20 mg Oral Daily  . pregabalin  100 mg Oral Q24H  . senna-docusate  1 tablet Oral Daily   Continuous Infusions: . cefTRIAXone (ROCEPHIN)  IV Stopped (12/28/17 0005)   PRN Meds:.acetaminophen **OR** acetaminophen, albuterol, hydrALAZINE, ondansetron **OR** ondansetron (ZOFRAN) IV  Allergies:  Allergies  Allergen Reactions  . Capsaicin Other (See Comments)    Topical application BURNED the skin    Family History  Problem Relation Age of Onset  . Diabetes Father     Social History:  reports that  has never smoked. she has never used smokeless tobacco. She reports that she drinks alcohol. She reports that she does not use drugs.  ROS: A complete review of systems was performed.  All systems are negative except for pertinent findings as noted.  Physical Exam:  Vital signs in last 24 hours: Temp:  [98.3 F (36.8 C)-99.4 F (37.4 C)] 99.4 F (37.4 C) (12/30 0528) Pulse Rate:  [70-79] 79 (12/30 0528) Resp:  [16-17] 16 (12/30 0528) BP: (170)/(58-75) 170/75 (12/30 0528) SpO2:  [90 %-94 %] 90 % (12/30 0528) Weight:  [68.7 kg (151 lb 7.3 oz)] 68.7 kg (151 lb 7.3 oz) (12/30 0408) Constitutional:  Alert and oriented, No acute distress Cardiovascular: Regular rate and rhythm, No JVD Respiratory: Normal respiratory effort, Lungs clear bilaterally GI: Abdomen is  soft, nontender, nondistended, no abdominal masses GU: No CVA tenderness Lymphatic: No lymphadenopathy Neurologic: Grossly intact, no focal deficits Psychiatric: Normal mood and affect  Laboratory Data:  Recent Labs    12/26/17 2117 12/27/17 0622 12/28/17 0415  WBC 11.3* 11.8* 13.4*  HGB 12.0 12.4 12.5  HCT 37.1 38.4 38.8  PLT 194 164 167    Recent Labs    12/26/17 2117 12/27/17 0622 12/28/17 0415  NA 137 137 135  K 5.1 4.6 4.2  CL 100* 101 102  GLUCOSE 304* 275* 214*  BUN 60* 52* 45*  CALCIUM 9.1 8.9 8.7*  CREATININE 3.32* 2.92* 2.62*     Results for orders placed or performed during the hospital encounter of 12/26/17 (from the past 24 hour(s))  Glucose, capillary     Status: Abnormal   Collection Time: 12/27/17  9:17  PM  Result Value Ref Range   Glucose-Capillary 246 (H) 65 - 99 mg/dL  Basic metabolic panel     Status: Abnormal   Collection Time: 12/28/17  4:15 AM  Result Value Ref Range   Sodium 135 135 - 145 mmol/L   Potassium 4.2 3.5 - 5.1 mmol/L   Chloride 102 101 - 111 mmol/L   CO2 21 (L) 22 - 32 mmol/L   Glucose, Bld 214 (H) 65 - 99 mg/dL   BUN 45 (H) 6 - 20 mg/dL   Creatinine, Ser 2.62 (H) 0.44 - 1.00 mg/dL   Calcium 8.7 (L) 8.9 - 10.3 mg/dL   GFR calc non Af Amer 14 (L) >60 mL/min   GFR calc Af Amer 16 (L) >60 mL/min   Anion gap 12 5 - 15  CBC     Status: Abnormal   Collection Time: 12/28/17  4:15 AM  Result Value Ref Range   WBC 13.4 (H) 4.0 - 10.5 K/uL   RBC 3.94 3.87 - 5.11 MIL/uL   Hemoglobin 12.5 12.0 - 15.0 g/dL   HCT 38.8 36.0 - 46.0 %   MCV 98.5 78.0 - 100.0 fL   MCH 31.7 26.0 - 34.0 pg   MCHC 32.2 30.0 - 36.0 g/dL   RDW 13.2 11.5 - 15.5 %   Platelets 167 150 - 400 K/uL  Glucose, capillary     Status: Abnormal   Collection Time: 12/28/17  7:44 AM  Result Value Ref Range   Glucose-Capillary 180 (H) 65 - 99 mg/dL  Glucose, capillary     Status: Abnormal   Collection Time: 12/28/17 12:14 PM  Result Value Ref Range    Glucose-Capillary 305 (H) 65 - 99 mg/dL   Recent Results (from the past 240 hour(s))  Urine culture     Status: Abnormal (Preliminary result)   Collection Time: 12/26/17 10:42 PM  Result Value Ref Range Status   Specimen Description URINE, CLEAN CATCH  Final   Special Requests NONE  Final   Culture >=100,000 COLONIES/mL ESCHERICHIA COLI (A)  Final   Report Status PENDING  Incomplete  MRSA PCR Screening     Status: None   Collection Time: 12/27/17  4:17 AM  Result Value Ref Range Status   MRSA by PCR NEGATIVE NEGATIVE Final    Comment:        The GeneXpert MRSA Assay (FDA approved for NASAL specimens only), is one component of a comprehensive MRSA colonization surveillance program. It is not intended to diagnose MRSA infection nor to guide or monitor treatment for MRSA infections.     Renal Function: Recent Labs    12/26/17 2117 12/27/17 0622 12/28/17 0415  CREATININE 3.32* 2.92* 2.62*   Estimated Creatinine Clearance: 11.1 mL/min (A) (by C-G formula based on SCr of 2.62 mg/dL (H)).  Radiologic Imaging: US Renal  Result Date: 12/27/2017 CLINICAL DATA:  Acute onset of elevated creatinine. EXAM: RENAL / URINARY TRACT ULTRASOUND COMPLETE COMPARISON:  None. FINDINGS: Right Kidney: Length: 8.9 cm. Mildly increased parenchymal echogenicity is noted. Mild renal cortical thinning is noted. No mass or hydronephrosis visualized. Left Kidney: Length: 10.5 cm. Mildly increased parenchymal echogenicity is noted. Mild renal cortical thinning is noted. A small 1.3 cm cyst is noted near the upper pole of the left kidney. Mild to moderate left-sided hydronephrosis is noted. Bladder: There is suggestion of asymmetric left posterior bladder wall thickening. This may reflect inflammation, though a mass cannot be entirely excluded. A right-sided ureteral jet is visualized. Small right and  trace left pleural effusions are noted. IMPRESSION: 1. Mild-to-moderate left-sided hydronephrosis. No ureteral  jet is seen on the left. This is concerning for distal obstruction. 2. Suggestion of asymmetric left posterior bladder wall thickening. This may reflect inflammation, though a mass cannot be entirely excluded. Cystoscopy could be considered for further evaluation, as deemed clinically appropriate. 3. Mild renal cortical thinning raises concern for chronic renal disease. 4. Mildly increased renal parenchymal echogenicity raises concern for medical renal disease. 5. Small right and trace left pleural effusions noted. Electronically Signed   By: Garald Balding M.D.   On: 12/27/2017 01:46   Ct Renal Stone Study  Result Date: 12/27/2017 CLINICAL DATA:  83 y/o  F; flank pain. EXAM: CT ABDOMEN AND PELVIS WITHOUT CONTRAST TECHNIQUE: Multidetector CT imaging of the abdomen and pelvis was performed following the standard protocol without IV contrast. COMPARISON:  12/27/2017 renal ultrasound. FINDINGS: Lower chest: Moderate to severe coronary artery calcification. Multiple calcified nodules in the lung bases. Trace right and small left pleural effusions. Moderate hiatal hernia. Hepatobiliary: No focal liver lesion. Cholelithiasis. No biliary ductal dilatation. Pancreas: Unremarkable. No pancreatic ductal dilatation or surrounding inflammatory changes. Spleen: Normal in size without focal abnormality. Adrenals/Urinary Tract: Normal adrenal glands. No focal kidney lesion. Normal right ureter. Moderate left hydronephrosis and perinephric stranding. No obstructing stone identified. Mild diffuse bladder wall thickening. Stomach/Bowel: Stomach is within normal limits. Appendix appears normal. No evidence of bowel wall thickening, distention, or inflammatory changes. Vascular/Lymphatic: Aortic atherosclerosis. No enlarged abdominal or pelvic lymph nodes. Reproductive: Uterus and bilateral adnexa are unremarkable. Small calcified myoma within uterine fundus. Other: No abdominal wall hernia or abnormality. No abdominopelvic  ascites. Right iliopsoas atrophy. Musculoskeletal: Moderate lumbar levocurvature. Bilateral proximal femur fixation hardware. Chronic fracture deformity of left superior pubic ramus. No acute fracture identified. Moderate lumbar spondylosis. IMPRESSION: 1. Moderate left hydronephrosis and perinephric stranding. No obstructing stone identified. Findings may represent infection of the renal collecting system or recently passed stone. 2. Mild bladder wall thickening, possibly cystitis. 3. Coronary artery and aortic calcific atherosclerosis. 4. Moderate hiatal hernia. 5. Trace right and small left pleural effusions. Electronically Signed   By: Kristine Garbe M.D.   On: 12/27/2017 05:10    I independently reviewed the above imaging studies.  Impression/Recommendation 1.  UTI/left pyelonephritis-  Urine culture from 12/28 grew E. Coli (sensitivity pending).  Currently on IV rocephin.  Abx per primary team.  2.  Left hydronephrosis with AKI-  Her renal function is slowly improving with IVF (creatinine today was 2.6.  Was 3.3 on admission).  After a long discussion with the patient concerning cystoscopic ureteral stent placement, she adamantly refused to have any surgical procedures performed at this time.  I explained that the etiology of her left hydronephrosis is still unclear and that it could potentially be contributing to her current clinical picture and that it could lead to decline in her overall renal function.  After this discussion, the patient still refused to have any intervention concerning her left sided hydronephrosis.  Will arrange outpatient f/u with a repeat RUS in the next 2-3 weeks to monitor her hydronephrosis and UTI.  Ellison Hughs, MD [facility] 12/28/2017, 2:18 PM

## 2017-12-28 NOTE — Clinical Social Work Note (Signed)
CSW facilitated patient discharge including contacting patient family and facility to confirm patient discharge plans. Clinical information faxed to facility and family agreeable with plan. CSW arranged ambulance transport via [facility] to [facility] in about 1 hour. RN to call report prior to discharge [phone]).  CSW will sign off for now as social work intervention is no longer needed. Please consult [place] again if new needs arise.  [place]ton Scrape, [place]

## 2017-12-28 NOTE — Evaluation (Addendum)
Physical Therapy Evaluation Patient Details Name: [name] MRN: [id number] DOB: 1915-09-04 Today's Date: 12/28/2017   History of Present Illness  Pt is a 83 y.o. female currently being evaluated for a UTI/left pyelonephritis.  She initially presented to the [facility] ED on 12/29 with worsening abdominal pain and N/V for the past 24-48 hours.   Clinical Impression  Pt admitted with above diagnosis. Pt currently with functional limitations due to the deficits listed below (see PT Problem List). On eval, pt required min guard assist transfers and ambulation 60 feet with RW. Pt fatigued quickly.  Pt will benefit from skilled PT to increase their independence and safety with mobility to allow discharge to the venue listed below.       Follow Up Recommendations Home health PT    Equipment Recommendations  None recommended by PT    Recommendations for Other Services       Precautions / Restrictions Precautions Precautions: Fall      Mobility  Bed Mobility Overal bed mobility: Modified Independent             General bed mobility comments: +rail, increased time and effort  Transfers Overall transfer level: Needs assistance Equipment used: Rolling walker (2 wheeled) Transfers: Sit to/from [facility] Sit to Stand: Min guard Stand pivot transfers: Min guard       General transfer comment: min guard assist for safety  Ambulation/Gait Ambulation/Gait assistance: Min guard Ambulation Distance (Feet): 60 Feet Assistive device: Rolling walker (2 wheeled) Gait Pattern/deviations: Step-through pattern;Decreased stride length;Trunk flexed Gait velocity: decreased   General Gait Details: gait distance limited by fatigue  Stairs            Wheelchair Mobility    Modified Rankin (Stroke Patients Only)       Balance Overall balance assessment: Needs assistance Sitting-balance support: No upper extremity supported;Feet supported Sitting  balance-Leahy Scale: Good     Standing balance support: Bilateral upper extremity supported;During functional activity Standing balance-Leahy Scale: Fair Standing balance comment: heavy reliance on RW. No overt LOB.                             Pertinent Vitals/Pain Pain Assessment: Faces Faces Pain Scale: Hurts a little bit Pain Location: abdomen Pain Descriptors / Indicators: Sore Pain Intervention(s): Monitored during session    Home Living Family/patient expects to be discharged to:: Assisted living               Home Equipment: Walker - 2 wheels      Prior Function Level of Independence: Needs assistance   Gait / Transfers Assistance Needed: mod I ambulation with RW throughout facility  ADL's / Homemaking Assistance Needed: ALF staff assists with ADLs.        Hand Dominance   Dominant Hand: Right    Extremity/Trunk Assessment   Upper Extremity Assessment Upper Extremity Assessment: Overall WFL for tasks assessed    Lower Extremity Assessment Lower Extremity Assessment: Generalized weakness    Cervical / Trunk Assessment Cervical / Trunk Assessment: Kyphotic  Communication   Communication: HOH  Cognition Arousal/Alertness: Awake/alert Behavior During Therapy: WFL for tasks assessed/performed Overall Cognitive Status: Within Functional Limits for tasks assessed                                        General Comments  Exercises     Assessment/Plan    PT Assessment Patient needs continued PT services  PT Problem List Decreased strength;Decreased mobility;Decreased activity tolerance;Decreased balance;Pain       PT Treatment Interventions Therapeutic activities;Gait training;Therapeutic exercise;Patient/family education;Balance training;Functional mobility training    PT Goals (Current goals can be found in the Care Plan section)  Acute Rehab PT Goals Patient Stated Goal: return to [facility] PT Goal  Formulation: With patient/family Time For Goal Achievement: 01/11/18 Potential to Achieve Goals: Good    Frequency Min 3X/week   Barriers to discharge        Co-evaluation               AM-PAC PT "6 Clicks" Daily Activity  Outcome Measure Difficulty turning over in bed (including adjusting bedclothes, sheets and blankets)?: A Little Difficulty moving from lying on back to sitting on the side of the bed? : A Little Difficulty sitting down on and standing up from a chair with arms (e.g., wheelchair, bedside commode, etc,.)?: A Little Help needed moving to and from a bed to chair (including a wheelchair)?: A Little Help needed walking in hospital room?: A Little Help needed climbing 3-5 steps with a railing? : A Lot 6 Click Score: 17    End of Session Equipment Utilized During Treatment: Gait belt Activity Tolerance: Patient tolerated treatment well Patient left: in bed;with call bell/phone within reach;with bed alarm set;with family/visitor present Nurse Communication: Mobility status PT Visit Diagnosis: Muscle weakness (generalized) (M62.81);Difficulty in walking, not elsewhere classified (R26.2)    Time: 9629-5284 PT Time Calculation (min) (ACUTE ONLY): 18 min   Charges:   PT Evaluation $PT Eval Moderate Complexity: 1 Mod     PT G Codes:   PT G-Codes **NOT FOR INPATIENT CLASS** Functional Assessment Tool Used: AM-PAC 6 Clicks Basic Mobility Functional Limitation: Mobility: Walking and moving around Mobility: Walking and Moving Around Current Status (X3244): At least 40 percent but less than 60 percent impaired, limited or restricted Mobility: Walking and Moving Around Goal Status [phone]): At least 20 percent but less than 40 percent impaired, limited or restricted    Lorrin Goodell, PT  Office # [phone] Pager [phone]   Lorriane Shire 12/28/2017, 3:17 PM

## 2017-12-29 LAB — URINE CULTURE

## 2017-12-29 LAB — GLUCOSE, CAPILLARY: GLUCOSE-CAPILLARY: 224 mg/dL — AB (ref 65–99)

## 2018-01-01 NOTE — Consult Note (Signed)
            [facility] CM Primary Care Navigator  01/01/2018  [name] 02-21-1915 [id number]   Previous attemptto seepatient at the bedside to identify possible discharge needs butshe was already discharged per staff report.  Per chart review, patient is from [place] and plan was for her to return back there with therapy recommendation for home health services. [facility].  Patient has discharge instruction to follow-up with primary care provider as soon as possible for a visit in 1 week.   For additional questions please contact:  Edwena Felty A. Lory Nowaczyk, BSN, RN-BC [facility] PRIMARY CARE Navigator Cell: [phone]

## 2018-01-06 DIAGNOSIS — R531 Weakness: Secondary | ICD-10-CM | POA: Diagnosis not present

## 2018-01-06 DIAGNOSIS — Z6824 Body mass index (BMI) 24.0-24.9, adult: Secondary | ICD-10-CM | POA: Diagnosis not present

## 2018-01-06 DIAGNOSIS — I10 Essential (primary) hypertension: Secondary | ICD-10-CM | POA: Diagnosis not present

## 2018-01-06 DIAGNOSIS — E1151 Type 2 diabetes mellitus with diabetic peripheral angiopathy without gangrene: Secondary | ICD-10-CM | POA: Diagnosis not present

## 2018-01-06 DIAGNOSIS — R197 Diarrhea, unspecified: Secondary | ICD-10-CM | POA: Diagnosis not present

## 2018-01-06 DIAGNOSIS — E538 Deficiency of other specified B group vitamins: Secondary | ICD-10-CM | POA: Diagnosis not present

## 2018-01-06 DIAGNOSIS — N183 Chronic kidney disease, stage 3 (moderate): Secondary | ICD-10-CM | POA: Diagnosis not present

## 2018-01-06 DIAGNOSIS — R062 Wheezing: Secondary | ICD-10-CM | POA: Diagnosis not present

## 2018-01-06 DIAGNOSIS — N1330 Unspecified hydronephrosis: Secondary | ICD-10-CM | POA: Diagnosis not present

## 2018-01-13 DIAGNOSIS — R0989 Other specified symptoms and signs involving the circulatory and respiratory systems: Secondary | ICD-10-CM | POA: Diagnosis not present

## 2018-01-13 DIAGNOSIS — R05 Cough: Secondary | ICD-10-CM | POA: Diagnosis not present

## 2018-01-22 DIAGNOSIS — N130 Hydronephrosis with ureteropelvic junction obstruction: Secondary | ICD-10-CM | POA: Diagnosis not present

## 2018-01-26 DIAGNOSIS — N12 Tubulo-interstitial nephritis, not specified as acute or chronic: Secondary | ICD-10-CM | POA: Diagnosis not present

## 2018-01-26 DIAGNOSIS — G464 Cerebellar stroke syndrome: Secondary | ICD-10-CM | POA: Diagnosis not present

## 2018-01-26 DIAGNOSIS — N390 Urinary tract infection, site not specified: Secondary | ICD-10-CM | POA: Diagnosis not present

## 2018-01-26 DIAGNOSIS — I440 Atrioventricular block, first degree: Secondary | ICD-10-CM | POA: Diagnosis not present

## 2018-01-26 DIAGNOSIS — E038 Other specified hypothyroidism: Secondary | ICD-10-CM | POA: Diagnosis not present

## 2018-01-26 DIAGNOSIS — J449 Chronic obstructive pulmonary disease, unspecified: Secondary | ICD-10-CM | POA: Diagnosis not present

## 2018-01-26 DIAGNOSIS — E1300 Other specified diabetes mellitus with hyperosmolarity without nonketotic hyperglycemic-hyperosmolar coma (NKHHC): Secondary | ICD-10-CM | POA: Diagnosis not present

## 2018-01-26 DIAGNOSIS — I10 Essential (primary) hypertension: Secondary | ICD-10-CM | POA: Diagnosis not present

## 2018-01-29 DIAGNOSIS — R062 Wheezing: Secondary | ICD-10-CM | POA: Diagnosis not present

## 2018-02-02 DIAGNOSIS — J984 Other disorders of lung: Secondary | ICD-10-CM | POA: Diagnosis not present

## 2018-02-02 DIAGNOSIS — E119 Type 2 diabetes mellitus without complications: Secondary | ICD-10-CM | POA: Diagnosis not present

## 2018-02-02 DIAGNOSIS — J810 Acute pulmonary edema: Secondary | ICD-10-CM | POA: Diagnosis not present

## 2018-02-02 DIAGNOSIS — Z7409 Other reduced mobility: Secondary | ICD-10-CM | POA: Diagnosis not present

## 2018-02-02 DIAGNOSIS — Z779 Other contact with and (suspected) exposures hazardous to health: Secondary | ICD-10-CM | POA: Diagnosis not present

## 2018-02-03 DIAGNOSIS — Z961 Presence of intraocular lens: Secondary | ICD-10-CM | POA: Diagnosis not present

## 2018-02-03 DIAGNOSIS — E103293 Type 1 diabetes mellitus with mild nonproliferative diabetic retinopathy without macular edema, bilateral: Secondary | ICD-10-CM | POA: Diagnosis not present

## 2018-02-03 DIAGNOSIS — H52203 Unspecified astigmatism, bilateral: Secondary | ICD-10-CM | POA: Diagnosis not present

## 2018-02-06 DIAGNOSIS — E119 Type 2 diabetes mellitus without complications: Secondary | ICD-10-CM | POA: Diagnosis not present

## 2018-02-06 DIAGNOSIS — Z9181 History of falling: Secondary | ICD-10-CM | POA: Diagnosis not present

## 2018-02-06 DIAGNOSIS — Z7982 Long term (current) use of aspirin: Secondary | ICD-10-CM | POA: Diagnosis not present

## 2018-02-06 DIAGNOSIS — I10 Essential (primary) hypertension: Secondary | ICD-10-CM | POA: Diagnosis not present

## 2018-02-06 DIAGNOSIS — Z87440 Personal history of urinary (tract) infections: Secondary | ICD-10-CM | POA: Diagnosis not present

## 2018-02-06 DIAGNOSIS — Z794 Long term (current) use of insulin: Secondary | ICD-10-CM | POA: Diagnosis not present

## 2018-02-09 DIAGNOSIS — E119 Type 2 diabetes mellitus without complications: Secondary | ICD-10-CM | POA: Diagnosis not present

## 2018-02-09 DIAGNOSIS — J810 Acute pulmonary edema: Secondary | ICD-10-CM | POA: Diagnosis not present

## 2018-02-09 DIAGNOSIS — Z87440 Personal history of urinary (tract) infections: Secondary | ICD-10-CM | POA: Diagnosis not present

## 2018-02-09 DIAGNOSIS — I10 Essential (primary) hypertension: Secondary | ICD-10-CM | POA: Diagnosis not present

## 2018-02-09 DIAGNOSIS — Z9181 History of falling: Secondary | ICD-10-CM | POA: Diagnosis not present

## 2018-02-09 DIAGNOSIS — Z7982 Long term (current) use of aspirin: Secondary | ICD-10-CM | POA: Diagnosis not present

## 2018-02-09 DIAGNOSIS — I63541 Cerebral infarction due to unspecified occlusion or stenosis of right cerebellar artery: Secondary | ICD-10-CM | POA: Diagnosis not present

## 2018-02-09 DIAGNOSIS — Z794 Long term (current) use of insulin: Secondary | ICD-10-CM | POA: Diagnosis not present

## 2018-02-09 DIAGNOSIS — I872 Venous insufficiency (chronic) (peripheral): Secondary | ICD-10-CM | POA: Diagnosis not present

## 2018-02-09 DIAGNOSIS — R600 Localized edema: Secondary | ICD-10-CM | POA: Diagnosis not present

## 2018-02-10 DIAGNOSIS — E119 Type 2 diabetes mellitus without complications: Secondary | ICD-10-CM | POA: Diagnosis not present

## 2018-02-10 DIAGNOSIS — Z794 Long term (current) use of insulin: Secondary | ICD-10-CM | POA: Diagnosis not present

## 2018-02-10 DIAGNOSIS — Z7982 Long term (current) use of aspirin: Secondary | ICD-10-CM | POA: Diagnosis not present

## 2018-02-10 DIAGNOSIS — I10 Essential (primary) hypertension: Secondary | ICD-10-CM | POA: Diagnosis not present

## 2018-02-10 DIAGNOSIS — Z9181 History of falling: Secondary | ICD-10-CM | POA: Diagnosis not present

## 2018-02-10 DIAGNOSIS — Z87440 Personal history of urinary (tract) infections: Secondary | ICD-10-CM | POA: Diagnosis not present

## 2018-02-11 ENCOUNTER — Ambulatory Visit: Payer: Medicare HMO | Admitting: Podiatry

## 2018-02-12 DIAGNOSIS — Z794 Long term (current) use of insulin: Secondary | ICD-10-CM | POA: Diagnosis not present

## 2018-02-12 DIAGNOSIS — Z87440 Personal history of urinary (tract) infections: Secondary | ICD-10-CM | POA: Diagnosis not present

## 2018-02-12 DIAGNOSIS — I10 Essential (primary) hypertension: Secondary | ICD-10-CM | POA: Diagnosis not present

## 2018-02-12 DIAGNOSIS — Z9181 History of falling: Secondary | ICD-10-CM | POA: Diagnosis not present

## 2018-02-12 DIAGNOSIS — E119 Type 2 diabetes mellitus without complications: Secondary | ICD-10-CM | POA: Diagnosis not present

## 2018-02-12 DIAGNOSIS — Z7982 Long term (current) use of aspirin: Secondary | ICD-10-CM | POA: Diagnosis not present

## 2018-02-16 DIAGNOSIS — Z794 Long term (current) use of insulin: Secondary | ICD-10-CM | POA: Diagnosis not present

## 2018-02-16 DIAGNOSIS — R5383 Other fatigue: Secondary | ICD-10-CM | POA: Diagnosis not present

## 2018-02-16 DIAGNOSIS — I872 Venous insufficiency (chronic) (peripheral): Secondary | ICD-10-CM | POA: Diagnosis not present

## 2018-02-16 DIAGNOSIS — Z9181 History of falling: Secondary | ICD-10-CM | POA: Diagnosis not present

## 2018-02-16 DIAGNOSIS — Z7982 Long term (current) use of aspirin: Secondary | ICD-10-CM | POA: Diagnosis not present

## 2018-02-16 DIAGNOSIS — E119 Type 2 diabetes mellitus without complications: Secondary | ICD-10-CM | POA: Diagnosis not present

## 2018-02-16 DIAGNOSIS — Z87440 Personal history of urinary (tract) infections: Secondary | ICD-10-CM | POA: Diagnosis not present

## 2018-02-16 DIAGNOSIS — I509 Heart failure, unspecified: Secondary | ICD-10-CM | POA: Diagnosis not present

## 2018-02-16 DIAGNOSIS — I10 Essential (primary) hypertension: Secondary | ICD-10-CM | POA: Diagnosis not present

## 2018-02-18 DIAGNOSIS — E119 Type 2 diabetes mellitus without complications: Secondary | ICD-10-CM | POA: Diagnosis not present

## 2018-02-18 DIAGNOSIS — Z87440 Personal history of urinary (tract) infections: Secondary | ICD-10-CM | POA: Diagnosis not present

## 2018-02-18 DIAGNOSIS — Z794 Long term (current) use of insulin: Secondary | ICD-10-CM | POA: Diagnosis not present

## 2018-02-18 DIAGNOSIS — Z9181 History of falling: Secondary | ICD-10-CM | POA: Diagnosis not present

## 2018-02-18 DIAGNOSIS — I10 Essential (primary) hypertension: Secondary | ICD-10-CM | POA: Diagnosis not present

## 2018-02-18 DIAGNOSIS — Z7982 Long term (current) use of aspirin: Secondary | ICD-10-CM | POA: Diagnosis not present

## 2018-02-19 DIAGNOSIS — Z7982 Long term (current) use of aspirin: Secondary | ICD-10-CM | POA: Diagnosis not present

## 2018-02-19 DIAGNOSIS — Z87440 Personal history of urinary (tract) infections: Secondary | ICD-10-CM | POA: Diagnosis not present

## 2018-02-19 DIAGNOSIS — Z9181 History of falling: Secondary | ICD-10-CM | POA: Diagnosis not present

## 2018-02-19 DIAGNOSIS — Z794 Long term (current) use of insulin: Secondary | ICD-10-CM | POA: Diagnosis not present

## 2018-02-19 DIAGNOSIS — I10 Essential (primary) hypertension: Secondary | ICD-10-CM | POA: Diagnosis not present

## 2018-02-19 DIAGNOSIS — E119 Type 2 diabetes mellitus without complications: Secondary | ICD-10-CM | POA: Diagnosis not present

## 2018-02-20 DIAGNOSIS — Z87440 Personal history of urinary (tract) infections: Secondary | ICD-10-CM | POA: Diagnosis not present

## 2018-02-20 DIAGNOSIS — E119 Type 2 diabetes mellitus without complications: Secondary | ICD-10-CM | POA: Diagnosis not present

## 2018-02-20 DIAGNOSIS — Z794 Long term (current) use of insulin: Secondary | ICD-10-CM | POA: Diagnosis not present

## 2018-02-20 DIAGNOSIS — I10 Essential (primary) hypertension: Secondary | ICD-10-CM | POA: Diagnosis not present

## 2018-02-20 DIAGNOSIS — Z7982 Long term (current) use of aspirin: Secondary | ICD-10-CM | POA: Diagnosis not present

## 2018-02-20 DIAGNOSIS — Z9181 History of falling: Secondary | ICD-10-CM | POA: Diagnosis not present

## 2018-02-23 DIAGNOSIS — Z794 Long term (current) use of insulin: Secondary | ICD-10-CM | POA: Diagnosis not present

## 2018-02-23 DIAGNOSIS — M19019 Primary osteoarthritis, unspecified shoulder: Secondary | ICD-10-CM | POA: Diagnosis not present

## 2018-02-23 DIAGNOSIS — Z9181 History of falling: Secondary | ICD-10-CM | POA: Diagnosis not present

## 2018-02-23 DIAGNOSIS — E119 Type 2 diabetes mellitus without complications: Secondary | ICD-10-CM | POA: Diagnosis not present

## 2018-02-23 DIAGNOSIS — F0390 Unspecified dementia without behavioral disturbance: Secondary | ICD-10-CM | POA: Diagnosis not present

## 2018-02-23 DIAGNOSIS — M47896 Other spondylosis, lumbar region: Secondary | ICD-10-CM | POA: Diagnosis not present

## 2018-02-23 DIAGNOSIS — I10 Essential (primary) hypertension: Secondary | ICD-10-CM | POA: Diagnosis not present

## 2018-02-23 DIAGNOSIS — Z7982 Long term (current) use of aspirin: Secondary | ICD-10-CM | POA: Diagnosis not present

## 2018-02-23 DIAGNOSIS — Z87440 Personal history of urinary (tract) infections: Secondary | ICD-10-CM | POA: Diagnosis not present

## 2018-02-23 DIAGNOSIS — Z79899 Other long term (current) drug therapy: Secondary | ICD-10-CM | POA: Diagnosis not present

## 2018-02-24 DIAGNOSIS — Z9181 History of falling: Secondary | ICD-10-CM | POA: Diagnosis not present

## 2018-02-24 DIAGNOSIS — I872 Venous insufficiency (chronic) (peripheral): Secondary | ICD-10-CM | POA: Diagnosis not present

## 2018-02-24 DIAGNOSIS — Z794 Long term (current) use of insulin: Secondary | ICD-10-CM | POA: Diagnosis not present

## 2018-02-24 DIAGNOSIS — Z7982 Long term (current) use of aspirin: Secondary | ICD-10-CM | POA: Diagnosis not present

## 2018-02-24 DIAGNOSIS — L84 Corns and callosities: Secondary | ICD-10-CM | POA: Diagnosis not present

## 2018-02-24 DIAGNOSIS — I739 Peripheral vascular disease, unspecified: Secondary | ICD-10-CM | POA: Diagnosis not present

## 2018-02-24 DIAGNOSIS — B351 Tinea unguium: Secondary | ICD-10-CM | POA: Diagnosis not present

## 2018-02-24 DIAGNOSIS — I10 Essential (primary) hypertension: Secondary | ICD-10-CM | POA: Diagnosis not present

## 2018-02-24 DIAGNOSIS — Z87440 Personal history of urinary (tract) infections: Secondary | ICD-10-CM | POA: Diagnosis not present

## 2018-02-24 DIAGNOSIS — E1300 Other specified diabetes mellitus with hyperosmolarity without nonketotic hyperglycemic-hyperosmolar coma (NKHHC): Secondary | ICD-10-CM | POA: Diagnosis not present

## 2018-02-24 DIAGNOSIS — E119 Type 2 diabetes mellitus without complications: Secondary | ICD-10-CM | POA: Diagnosis not present

## 2018-02-25 DIAGNOSIS — Z9181 History of falling: Secondary | ICD-10-CM | POA: Diagnosis not present

## 2018-02-25 DIAGNOSIS — Z7982 Long term (current) use of aspirin: Secondary | ICD-10-CM | POA: Diagnosis not present

## 2018-02-25 DIAGNOSIS — I10 Essential (primary) hypertension: Secondary | ICD-10-CM | POA: Diagnosis not present

## 2018-02-25 DIAGNOSIS — Z794 Long term (current) use of insulin: Secondary | ICD-10-CM | POA: Diagnosis not present

## 2018-02-25 DIAGNOSIS — Z87440 Personal history of urinary (tract) infections: Secondary | ICD-10-CM | POA: Diagnosis not present

## 2018-02-25 DIAGNOSIS — E119 Type 2 diabetes mellitus without complications: Secondary | ICD-10-CM | POA: Diagnosis not present

## 2018-02-26 DIAGNOSIS — Z87440 Personal history of urinary (tract) infections: Secondary | ICD-10-CM | POA: Diagnosis not present

## 2018-02-26 DIAGNOSIS — Z794 Long term (current) use of insulin: Secondary | ICD-10-CM | POA: Diagnosis not present

## 2018-02-26 DIAGNOSIS — E119 Type 2 diabetes mellitus without complications: Secondary | ICD-10-CM | POA: Diagnosis not present

## 2018-02-26 DIAGNOSIS — I10 Essential (primary) hypertension: Secondary | ICD-10-CM | POA: Diagnosis not present

## 2018-02-26 DIAGNOSIS — Z9181 History of falling: Secondary | ICD-10-CM | POA: Diagnosis not present

## 2018-02-26 DIAGNOSIS — Z7982 Long term (current) use of aspirin: Secondary | ICD-10-CM | POA: Diagnosis not present

## 2018-03-02 DIAGNOSIS — E119 Type 2 diabetes mellitus without complications: Secondary | ICD-10-CM | POA: Diagnosis not present

## 2018-03-02 DIAGNOSIS — Z9181 History of falling: Secondary | ICD-10-CM | POA: Diagnosis not present

## 2018-03-02 DIAGNOSIS — Z87440 Personal history of urinary (tract) infections: Secondary | ICD-10-CM | POA: Diagnosis not present

## 2018-03-02 DIAGNOSIS — I10 Essential (primary) hypertension: Secondary | ICD-10-CM | POA: Diagnosis not present

## 2018-03-02 DIAGNOSIS — Z7982 Long term (current) use of aspirin: Secondary | ICD-10-CM | POA: Diagnosis not present

## 2018-03-02 DIAGNOSIS — Z794 Long term (current) use of insulin: Secondary | ICD-10-CM | POA: Diagnosis not present

## 2018-03-04 DIAGNOSIS — R05 Cough: Secondary | ICD-10-CM | POA: Diagnosis not present

## 2018-03-04 DIAGNOSIS — I440 Atrioventricular block, first degree: Secondary | ICD-10-CM | POA: Diagnosis not present

## 2018-03-04 DIAGNOSIS — E119 Type 2 diabetes mellitus without complications: Secondary | ICD-10-CM | POA: Diagnosis not present

## 2018-03-04 DIAGNOSIS — M1990 Unspecified osteoarthritis, unspecified site: Secondary | ICD-10-CM | POA: Diagnosis not present

## 2018-03-04 DIAGNOSIS — I10 Essential (primary) hypertension: Secondary | ICD-10-CM | POA: Diagnosis not present

## 2018-03-04 DIAGNOSIS — R54 Age-related physical debility: Secondary | ICD-10-CM | POA: Diagnosis not present

## 2018-03-04 DIAGNOSIS — Z7982 Long term (current) use of aspirin: Secondary | ICD-10-CM | POA: Diagnosis not present

## 2018-03-04 DIAGNOSIS — G894 Chronic pain syndrome: Secondary | ICD-10-CM | POA: Diagnosis not present

## 2018-03-04 DIAGNOSIS — M5136 Other intervertebral disc degeneration, lumbar region: Secondary | ICD-10-CM | POA: Diagnosis not present

## 2018-03-04 DIAGNOSIS — Z794 Long term (current) use of insulin: Secondary | ICD-10-CM | POA: Diagnosis not present

## 2018-03-04 DIAGNOSIS — Z9181 History of falling: Secondary | ICD-10-CM | POA: Diagnosis not present

## 2018-03-04 DIAGNOSIS — G308 Other Alzheimer's disease: Secondary | ICD-10-CM | POA: Diagnosis not present

## 2018-03-04 DIAGNOSIS — R0602 Shortness of breath: Secondary | ICD-10-CM | POA: Diagnosis not present

## 2018-03-04 DIAGNOSIS — Z87440 Personal history of urinary (tract) infections: Secondary | ICD-10-CM | POA: Diagnosis not present

## 2018-03-05 DIAGNOSIS — I10 Essential (primary) hypertension: Secondary | ICD-10-CM | POA: Diagnosis not present

## 2018-03-05 DIAGNOSIS — Z87440 Personal history of urinary (tract) infections: Secondary | ICD-10-CM | POA: Diagnosis not present

## 2018-03-05 DIAGNOSIS — E119 Type 2 diabetes mellitus without complications: Secondary | ICD-10-CM | POA: Diagnosis not present

## 2018-03-05 DIAGNOSIS — Z7982 Long term (current) use of aspirin: Secondary | ICD-10-CM | POA: Diagnosis not present

## 2018-03-05 DIAGNOSIS — Z794 Long term (current) use of insulin: Secondary | ICD-10-CM | POA: Diagnosis not present

## 2018-03-05 DIAGNOSIS — Z9181 History of falling: Secondary | ICD-10-CM | POA: Diagnosis not present

## 2018-03-10 DIAGNOSIS — I10 Essential (primary) hypertension: Secondary | ICD-10-CM | POA: Diagnosis not present

## 2018-03-10 DIAGNOSIS — Z7982 Long term (current) use of aspirin: Secondary | ICD-10-CM | POA: Diagnosis not present

## 2018-03-10 DIAGNOSIS — Z794 Long term (current) use of insulin: Secondary | ICD-10-CM | POA: Diagnosis not present

## 2018-03-10 DIAGNOSIS — Z87440 Personal history of urinary (tract) infections: Secondary | ICD-10-CM | POA: Diagnosis not present

## 2018-03-10 DIAGNOSIS — E119 Type 2 diabetes mellitus without complications: Secondary | ICD-10-CM | POA: Diagnosis not present

## 2018-03-10 DIAGNOSIS — Z9181 History of falling: Secondary | ICD-10-CM | POA: Diagnosis not present

## 2018-03-16 DIAGNOSIS — R5383 Other fatigue: Secondary | ICD-10-CM | POA: Diagnosis not present

## 2018-03-16 DIAGNOSIS — E119 Type 2 diabetes mellitus without complications: Secondary | ICD-10-CM | POA: Diagnosis not present

## 2018-03-16 DIAGNOSIS — R269 Unspecified abnormalities of gait and mobility: Secondary | ICD-10-CM | POA: Diagnosis not present

## 2018-03-16 DIAGNOSIS — I10 Essential (primary) hypertension: Secondary | ICD-10-CM | POA: Diagnosis not present

## 2018-03-16 DIAGNOSIS — I872 Venous insufficiency (chronic) (peripheral): Secondary | ICD-10-CM | POA: Diagnosis not present

## 2018-03-16 DIAGNOSIS — I509 Heart failure, unspecified: Secondary | ICD-10-CM | POA: Diagnosis not present

## 2018-03-16 DIAGNOSIS — K5900 Constipation, unspecified: Secondary | ICD-10-CM | POA: Diagnosis not present

## 2018-03-16 DIAGNOSIS — R609 Edema, unspecified: Secondary | ICD-10-CM | POA: Diagnosis not present

## 2018-03-20 DIAGNOSIS — E1140 Type 2 diabetes mellitus with diabetic neuropathy, unspecified: Secondary | ICD-10-CM | POA: Diagnosis not present

## 2018-03-20 DIAGNOSIS — N183 Chronic kidney disease, stage 3 (moderate): Secondary | ICD-10-CM | POA: Diagnosis not present

## 2018-03-20 DIAGNOSIS — E1122 Type 2 diabetes mellitus with diabetic chronic kidney disease: Secondary | ICD-10-CM | POA: Diagnosis not present

## 2018-03-20 DIAGNOSIS — Z87440 Personal history of urinary (tract) infections: Secondary | ICD-10-CM | POA: Diagnosis not present

## 2018-03-20 DIAGNOSIS — I129 Hypertensive chronic kidney disease with stage 1 through stage 4 chronic kidney disease, or unspecified chronic kidney disease: Secondary | ICD-10-CM | POA: Diagnosis not present

## 2018-03-20 DIAGNOSIS — K219 Gastro-esophageal reflux disease without esophagitis: Secondary | ICD-10-CM | POA: Diagnosis not present

## 2018-03-23 DIAGNOSIS — I5043 Acute on chronic combined systolic (congestive) and diastolic (congestive) heart failure: Secondary | ICD-10-CM | POA: Diagnosis not present

## 2018-03-23 DIAGNOSIS — N185 Chronic kidney disease, stage 5: Secondary | ICD-10-CM | POA: Diagnosis not present

## 2018-03-23 DIAGNOSIS — E875 Hyperkalemia: Secondary | ICD-10-CM | POA: Diagnosis not present

## 2018-03-23 DIAGNOSIS — E1100 Type 2 diabetes mellitus with hyperosmolarity without nonketotic hyperglycemic-hyperosmolar coma (NKHHC): Secondary | ICD-10-CM | POA: Diagnosis not present

## 2018-03-23 DIAGNOSIS — I10 Essential (primary) hypertension: Secondary | ICD-10-CM | POA: Diagnosis not present

## 2018-03-23 DIAGNOSIS — F0390 Unspecified dementia without behavioral disturbance: Secondary | ICD-10-CM | POA: Diagnosis not present

## 2018-03-25 DIAGNOSIS — Z87440 Personal history of urinary (tract) infections: Secondary | ICD-10-CM | POA: Diagnosis not present

## 2018-03-25 DIAGNOSIS — N183 Chronic kidney disease, stage 3 (moderate): Secondary | ICD-10-CM | POA: Diagnosis not present

## 2018-03-25 DIAGNOSIS — E1122 Type 2 diabetes mellitus with diabetic chronic kidney disease: Secondary | ICD-10-CM | POA: Diagnosis not present

## 2018-03-25 DIAGNOSIS — E1140 Type 2 diabetes mellitus with diabetic neuropathy, unspecified: Secondary | ICD-10-CM | POA: Diagnosis not present

## 2018-03-25 DIAGNOSIS — K219 Gastro-esophageal reflux disease without esophagitis: Secondary | ICD-10-CM | POA: Diagnosis not present

## 2018-03-25 DIAGNOSIS — I129 Hypertensive chronic kidney disease with stage 1 through stage 4 chronic kidney disease, or unspecified chronic kidney disease: Secondary | ICD-10-CM | POA: Diagnosis not present

## 2018-03-27 DIAGNOSIS — Z79899 Other long term (current) drug therapy: Secondary | ICD-10-CM | POA: Diagnosis not present

## 2018-03-27 DIAGNOSIS — K219 Gastro-esophageal reflux disease without esophagitis: Secondary | ICD-10-CM | POA: Diagnosis not present

## 2018-03-27 DIAGNOSIS — Z87440 Personal history of urinary (tract) infections: Secondary | ICD-10-CM | POA: Diagnosis not present

## 2018-03-27 DIAGNOSIS — I129 Hypertensive chronic kidney disease with stage 1 through stage 4 chronic kidney disease, or unspecified chronic kidney disease: Secondary | ICD-10-CM | POA: Diagnosis not present

## 2018-03-27 DIAGNOSIS — J441 Chronic obstructive pulmonary disease with (acute) exacerbation: Secondary | ICD-10-CM | POA: Diagnosis not present

## 2018-03-27 DIAGNOSIS — R54 Age-related physical debility: Secondary | ICD-10-CM | POA: Diagnosis not present

## 2018-03-27 DIAGNOSIS — I10 Essential (primary) hypertension: Secondary | ICD-10-CM | POA: Diagnosis not present

## 2018-03-27 DIAGNOSIS — N183 Chronic kidney disease, stage 3 (moderate): Secondary | ICD-10-CM | POA: Diagnosis not present

## 2018-03-27 DIAGNOSIS — E1140 Type 2 diabetes mellitus with diabetic neuropathy, unspecified: Secondary | ICD-10-CM | POA: Diagnosis not present

## 2018-03-27 DIAGNOSIS — E1122 Type 2 diabetes mellitus with diabetic chronic kidney disease: Secondary | ICD-10-CM | POA: Diagnosis not present

## 2018-03-27 DIAGNOSIS — E1100 Type 2 diabetes mellitus with hyperosmolarity without nonketotic hyperglycemic-hyperosmolar coma (NKHHC): Secondary | ICD-10-CM | POA: Diagnosis not present

## 2018-03-31 DIAGNOSIS — I129 Hypertensive chronic kidney disease with stage 1 through stage 4 chronic kidney disease, or unspecified chronic kidney disease: Secondary | ICD-10-CM | POA: Diagnosis not present

## 2018-03-31 DIAGNOSIS — E1140 Type 2 diabetes mellitus with diabetic neuropathy, unspecified: Secondary | ICD-10-CM | POA: Diagnosis not present

## 2018-03-31 DIAGNOSIS — Z87440 Personal history of urinary (tract) infections: Secondary | ICD-10-CM | POA: Diagnosis not present

## 2018-03-31 DIAGNOSIS — K219 Gastro-esophageal reflux disease without esophagitis: Secondary | ICD-10-CM | POA: Diagnosis not present

## 2018-03-31 DIAGNOSIS — N183 Chronic kidney disease, stage 3 (moderate): Secondary | ICD-10-CM | POA: Diagnosis not present

## 2018-03-31 DIAGNOSIS — E1122 Type 2 diabetes mellitus with diabetic chronic kidney disease: Secondary | ICD-10-CM | POA: Diagnosis not present

## 2018-04-02 DIAGNOSIS — I129 Hypertensive chronic kidney disease with stage 1 through stage 4 chronic kidney disease, or unspecified chronic kidney disease: Secondary | ICD-10-CM | POA: Diagnosis not present

## 2018-04-02 DIAGNOSIS — E1122 Type 2 diabetes mellitus with diabetic chronic kidney disease: Secondary | ICD-10-CM | POA: Diagnosis not present

## 2018-04-02 DIAGNOSIS — K219 Gastro-esophageal reflux disease without esophagitis: Secondary | ICD-10-CM | POA: Diagnosis not present

## 2018-04-02 DIAGNOSIS — E1140 Type 2 diabetes mellitus with diabetic neuropathy, unspecified: Secondary | ICD-10-CM | POA: Diagnosis not present

## 2018-04-02 DIAGNOSIS — N183 Chronic kidney disease, stage 3 (moderate): Secondary | ICD-10-CM | POA: Diagnosis not present

## 2018-04-02 DIAGNOSIS — Z87440 Personal history of urinary (tract) infections: Secondary | ICD-10-CM | POA: Diagnosis not present

## 2018-04-06 DIAGNOSIS — N189 Chronic kidney disease, unspecified: Secondary | ICD-10-CM | POA: Diagnosis not present

## 2018-04-06 DIAGNOSIS — I10 Essential (primary) hypertension: Secondary | ICD-10-CM | POA: Diagnosis not present

## 2018-04-06 DIAGNOSIS — R609 Edema, unspecified: Secondary | ICD-10-CM | POA: Diagnosis not present

## 2018-04-06 DIAGNOSIS — E1122 Type 2 diabetes mellitus with diabetic chronic kidney disease: Secondary | ICD-10-CM | POA: Diagnosis not present

## 2018-04-06 DIAGNOSIS — N938 Other specified abnormal uterine and vaginal bleeding: Secondary | ICD-10-CM | POA: Diagnosis not present

## 2018-04-06 DIAGNOSIS — F0390 Unspecified dementia without behavioral disturbance: Secondary | ICD-10-CM | POA: Diagnosis not present

## 2018-04-07 DIAGNOSIS — Z87440 Personal history of urinary (tract) infections: Secondary | ICD-10-CM | POA: Diagnosis not present

## 2018-04-07 DIAGNOSIS — K219 Gastro-esophageal reflux disease without esophagitis: Secondary | ICD-10-CM | POA: Diagnosis not present

## 2018-04-07 DIAGNOSIS — E1122 Type 2 diabetes mellitus with diabetic chronic kidney disease: Secondary | ICD-10-CM | POA: Diagnosis not present

## 2018-04-07 DIAGNOSIS — E1140 Type 2 diabetes mellitus with diabetic neuropathy, unspecified: Secondary | ICD-10-CM | POA: Diagnosis not present

## 2018-04-07 DIAGNOSIS — I129 Hypertensive chronic kidney disease with stage 1 through stage 4 chronic kidney disease, or unspecified chronic kidney disease: Secondary | ICD-10-CM | POA: Diagnosis not present

## 2018-04-07 DIAGNOSIS — N183 Chronic kidney disease, stage 3 (moderate): Secondary | ICD-10-CM | POA: Diagnosis not present

## 2018-04-09 DIAGNOSIS — E1122 Type 2 diabetes mellitus with diabetic chronic kidney disease: Secondary | ICD-10-CM | POA: Diagnosis not present

## 2018-04-09 DIAGNOSIS — E1140 Type 2 diabetes mellitus with diabetic neuropathy, unspecified: Secondary | ICD-10-CM | POA: Diagnosis not present

## 2018-04-09 DIAGNOSIS — I129 Hypertensive chronic kidney disease with stage 1 through stage 4 chronic kidney disease, or unspecified chronic kidney disease: Secondary | ICD-10-CM | POA: Diagnosis not present

## 2018-04-09 DIAGNOSIS — N183 Chronic kidney disease, stage 3 (moderate): Secondary | ICD-10-CM | POA: Diagnosis not present

## 2018-04-09 DIAGNOSIS — K219 Gastro-esophageal reflux disease without esophagitis: Secondary | ICD-10-CM | POA: Diagnosis not present

## 2018-04-09 DIAGNOSIS — Z87440 Personal history of urinary (tract) infections: Secondary | ICD-10-CM | POA: Diagnosis not present

## 2018-04-14 DIAGNOSIS — E1122 Type 2 diabetes mellitus with diabetic chronic kidney disease: Secondary | ICD-10-CM | POA: Diagnosis not present

## 2018-04-14 DIAGNOSIS — E1140 Type 2 diabetes mellitus with diabetic neuropathy, unspecified: Secondary | ICD-10-CM | POA: Diagnosis not present

## 2018-04-14 DIAGNOSIS — I129 Hypertensive chronic kidney disease with stage 1 through stage 4 chronic kidney disease, or unspecified chronic kidney disease: Secondary | ICD-10-CM | POA: Diagnosis not present

## 2018-04-14 DIAGNOSIS — K219 Gastro-esophageal reflux disease without esophagitis: Secondary | ICD-10-CM | POA: Diagnosis not present

## 2018-04-14 DIAGNOSIS — N183 Chronic kidney disease, stage 3 (moderate): Secondary | ICD-10-CM | POA: Diagnosis not present

## 2018-04-14 DIAGNOSIS — Z87440 Personal history of urinary (tract) infections: Secondary | ICD-10-CM | POA: Diagnosis not present

## 2018-04-15 DIAGNOSIS — R0602 Shortness of breath: Secondary | ICD-10-CM | POA: Diagnosis not present

## 2018-04-15 DIAGNOSIS — G894 Chronic pain syndrome: Secondary | ICD-10-CM | POA: Diagnosis not present

## 2018-04-15 DIAGNOSIS — M1990 Unspecified osteoarthritis, unspecified site: Secondary | ICD-10-CM | POA: Diagnosis not present

## 2018-04-15 DIAGNOSIS — G308 Other Alzheimer's disease: Secondary | ICD-10-CM | POA: Diagnosis not present

## 2018-04-15 DIAGNOSIS — R05 Cough: Secondary | ICD-10-CM | POA: Diagnosis not present

## 2018-04-15 DIAGNOSIS — M5136 Other intervertebral disc degeneration, lumbar region: Secondary | ICD-10-CM | POA: Diagnosis not present

## 2018-04-15 DIAGNOSIS — J441 Chronic obstructive pulmonary disease with (acute) exacerbation: Secondary | ICD-10-CM | POA: Diagnosis not present

## 2018-04-15 DIAGNOSIS — R54 Age-related physical debility: Secondary | ICD-10-CM | POA: Diagnosis not present

## 2018-04-16 DIAGNOSIS — N183 Chronic kidney disease, stage 3 (moderate): Secondary | ICD-10-CM | POA: Diagnosis not present

## 2018-04-16 DIAGNOSIS — Z87440 Personal history of urinary (tract) infections: Secondary | ICD-10-CM | POA: Diagnosis not present

## 2018-04-16 DIAGNOSIS — E1140 Type 2 diabetes mellitus with diabetic neuropathy, unspecified: Secondary | ICD-10-CM | POA: Diagnosis not present

## 2018-04-16 DIAGNOSIS — E1122 Type 2 diabetes mellitus with diabetic chronic kidney disease: Secondary | ICD-10-CM | POA: Diagnosis not present

## 2018-04-16 DIAGNOSIS — K219 Gastro-esophageal reflux disease without esophagitis: Secondary | ICD-10-CM | POA: Diagnosis not present

## 2018-04-16 DIAGNOSIS — I129 Hypertensive chronic kidney disease with stage 1 through stage 4 chronic kidney disease, or unspecified chronic kidney disease: Secondary | ICD-10-CM | POA: Diagnosis not present

## 2018-04-18 DIAGNOSIS — N939 Abnormal uterine and vaginal bleeding, unspecified: Secondary | ICD-10-CM | POA: Diagnosis not present

## 2018-04-20 DIAGNOSIS — G8929 Other chronic pain: Secondary | ICD-10-CM | POA: Diagnosis not present

## 2018-04-20 DIAGNOSIS — Z79899 Other long term (current) drug therapy: Secondary | ICD-10-CM | POA: Diagnosis not present

## 2018-04-20 DIAGNOSIS — I453 Trifascicular block: Secondary | ICD-10-CM | POA: Diagnosis not present

## 2018-04-20 DIAGNOSIS — M5136 Other intervertebral disc degeneration, lumbar region: Secondary | ICD-10-CM | POA: Diagnosis not present

## 2018-04-20 DIAGNOSIS — N323 Diverticulum of bladder: Secondary | ICD-10-CM | POA: Diagnosis not present

## 2018-04-20 DIAGNOSIS — R0602 Shortness of breath: Secondary | ICD-10-CM | POA: Diagnosis not present

## 2018-04-20 DIAGNOSIS — I441 Atrioventricular block, second degree: Secondary | ICD-10-CM | POA: Diagnosis not present

## 2018-04-20 DIAGNOSIS — I509 Heart failure, unspecified: Secondary | ICD-10-CM | POA: Diagnosis not present

## 2018-04-20 DIAGNOSIS — R05 Cough: Secondary | ICD-10-CM | POA: Diagnosis not present

## 2018-04-20 DIAGNOSIS — D259 Leiomyoma of uterus, unspecified: Secondary | ICD-10-CM | POA: Diagnosis not present

## 2018-04-21 DIAGNOSIS — K219 Gastro-esophageal reflux disease without esophagitis: Secondary | ICD-10-CM | POA: Diagnosis not present

## 2018-04-21 DIAGNOSIS — E1122 Type 2 diabetes mellitus with diabetic chronic kidney disease: Secondary | ICD-10-CM | POA: Diagnosis not present

## 2018-04-21 DIAGNOSIS — N183 Chronic kidney disease, stage 3 (moderate): Secondary | ICD-10-CM | POA: Diagnosis not present

## 2018-04-21 DIAGNOSIS — I129 Hypertensive chronic kidney disease with stage 1 through stage 4 chronic kidney disease, or unspecified chronic kidney disease: Secondary | ICD-10-CM | POA: Diagnosis not present

## 2018-04-21 DIAGNOSIS — E1140 Type 2 diabetes mellitus with diabetic neuropathy, unspecified: Secondary | ICD-10-CM | POA: Diagnosis not present

## 2018-04-21 DIAGNOSIS — Z87440 Personal history of urinary (tract) infections: Secondary | ICD-10-CM | POA: Diagnosis not present

## 2018-04-22 DIAGNOSIS — I499 Cardiac arrhythmia, unspecified: Secondary | ICD-10-CM | POA: Diagnosis not present

## 2018-04-23 DIAGNOSIS — E1140 Type 2 diabetes mellitus with diabetic neuropathy, unspecified: Secondary | ICD-10-CM | POA: Diagnosis not present

## 2018-04-23 DIAGNOSIS — E1122 Type 2 diabetes mellitus with diabetic chronic kidney disease: Secondary | ICD-10-CM | POA: Diagnosis not present

## 2018-04-23 DIAGNOSIS — Z87440 Personal history of urinary (tract) infections: Secondary | ICD-10-CM | POA: Diagnosis not present

## 2018-04-23 DIAGNOSIS — I129 Hypertensive chronic kidney disease with stage 1 through stage 4 chronic kidney disease, or unspecified chronic kidney disease: Secondary | ICD-10-CM | POA: Diagnosis not present

## 2018-04-23 DIAGNOSIS — K219 Gastro-esophageal reflux disease without esophagitis: Secondary | ICD-10-CM | POA: Diagnosis not present

## 2018-04-23 DIAGNOSIS — N183 Chronic kidney disease, stage 3 (moderate): Secondary | ICD-10-CM | POA: Diagnosis not present

## 2018-04-28 DIAGNOSIS — E1140 Type 2 diabetes mellitus with diabetic neuropathy, unspecified: Secondary | ICD-10-CM | POA: Diagnosis not present

## 2018-04-28 DIAGNOSIS — K219 Gastro-esophageal reflux disease without esophagitis: Secondary | ICD-10-CM | POA: Diagnosis not present

## 2018-04-28 DIAGNOSIS — E1122 Type 2 diabetes mellitus with diabetic chronic kidney disease: Secondary | ICD-10-CM | POA: Diagnosis not present

## 2018-04-28 DIAGNOSIS — Z87440 Personal history of urinary (tract) infections: Secondary | ICD-10-CM | POA: Diagnosis not present

## 2018-04-28 DIAGNOSIS — I129 Hypertensive chronic kidney disease with stage 1 through stage 4 chronic kidney disease, or unspecified chronic kidney disease: Secondary | ICD-10-CM | POA: Diagnosis not present

## 2018-04-28 DIAGNOSIS — N183 Chronic kidney disease, stage 3 (moderate): Secondary | ICD-10-CM | POA: Diagnosis not present

## 2018-04-30 DIAGNOSIS — N183 Chronic kidney disease, stage 3 (moderate): Secondary | ICD-10-CM | POA: Diagnosis not present

## 2018-04-30 DIAGNOSIS — K219 Gastro-esophageal reflux disease without esophagitis: Secondary | ICD-10-CM | POA: Diagnosis not present

## 2018-04-30 DIAGNOSIS — E1122 Type 2 diabetes mellitus with diabetic chronic kidney disease: Secondary | ICD-10-CM | POA: Diagnosis not present

## 2018-04-30 DIAGNOSIS — I129 Hypertensive chronic kidney disease with stage 1 through stage 4 chronic kidney disease, or unspecified chronic kidney disease: Secondary | ICD-10-CM | POA: Diagnosis not present

## 2018-04-30 DIAGNOSIS — Z87440 Personal history of urinary (tract) infections: Secondary | ICD-10-CM | POA: Diagnosis not present

## 2018-04-30 DIAGNOSIS — E1140 Type 2 diabetes mellitus with diabetic neuropathy, unspecified: Secondary | ICD-10-CM | POA: Diagnosis not present

## 2018-05-05 DIAGNOSIS — Z87440 Personal history of urinary (tract) infections: Secondary | ICD-10-CM | POA: Diagnosis not present

## 2018-05-05 DIAGNOSIS — K219 Gastro-esophageal reflux disease without esophagitis: Secondary | ICD-10-CM | POA: Diagnosis not present

## 2018-05-05 DIAGNOSIS — E1122 Type 2 diabetes mellitus with diabetic chronic kidney disease: Secondary | ICD-10-CM | POA: Diagnosis not present

## 2018-05-05 DIAGNOSIS — N183 Chronic kidney disease, stage 3 (moderate): Secondary | ICD-10-CM | POA: Diagnosis not present

## 2018-05-05 DIAGNOSIS — I129 Hypertensive chronic kidney disease with stage 1 through stage 4 chronic kidney disease, or unspecified chronic kidney disease: Secondary | ICD-10-CM | POA: Diagnosis not present

## 2018-05-05 DIAGNOSIS — E1140 Type 2 diabetes mellitus with diabetic neuropathy, unspecified: Secondary | ICD-10-CM | POA: Diagnosis not present

## 2018-05-07 DIAGNOSIS — E1140 Type 2 diabetes mellitus with diabetic neuropathy, unspecified: Secondary | ICD-10-CM | POA: Diagnosis not present

## 2018-05-07 DIAGNOSIS — K219 Gastro-esophageal reflux disease without esophagitis: Secondary | ICD-10-CM | POA: Diagnosis not present

## 2018-05-07 DIAGNOSIS — E1122 Type 2 diabetes mellitus with diabetic chronic kidney disease: Secondary | ICD-10-CM | POA: Diagnosis not present

## 2018-05-07 DIAGNOSIS — N183 Chronic kidney disease, stage 3 (moderate): Secondary | ICD-10-CM | POA: Diagnosis not present

## 2018-05-07 DIAGNOSIS — Z87440 Personal history of urinary (tract) infections: Secondary | ICD-10-CM | POA: Diagnosis not present

## 2018-05-07 DIAGNOSIS — I129 Hypertensive chronic kidney disease with stage 1 through stage 4 chronic kidney disease, or unspecified chronic kidney disease: Secondary | ICD-10-CM | POA: Diagnosis not present

## 2018-05-11 DIAGNOSIS — K219 Gastro-esophageal reflux disease without esophagitis: Secondary | ICD-10-CM | POA: Diagnosis not present

## 2018-05-11 DIAGNOSIS — Z87440 Personal history of urinary (tract) infections: Secondary | ICD-10-CM | POA: Diagnosis not present

## 2018-05-11 DIAGNOSIS — E1122 Type 2 diabetes mellitus with diabetic chronic kidney disease: Secondary | ICD-10-CM | POA: Diagnosis not present

## 2018-05-11 DIAGNOSIS — N183 Chronic kidney disease, stage 3 (moderate): Secondary | ICD-10-CM | POA: Diagnosis not present

## 2018-05-11 DIAGNOSIS — E1140 Type 2 diabetes mellitus with diabetic neuropathy, unspecified: Secondary | ICD-10-CM | POA: Diagnosis not present

## 2018-05-11 DIAGNOSIS — I129 Hypertensive chronic kidney disease with stage 1 through stage 4 chronic kidney disease, or unspecified chronic kidney disease: Secondary | ICD-10-CM | POA: Diagnosis not present

## 2018-05-13 DIAGNOSIS — N183 Chronic kidney disease, stage 3 (moderate): Secondary | ICD-10-CM | POA: Diagnosis not present

## 2018-05-13 DIAGNOSIS — E1140 Type 2 diabetes mellitus with diabetic neuropathy, unspecified: Secondary | ICD-10-CM | POA: Diagnosis not present

## 2018-05-13 DIAGNOSIS — E1122 Type 2 diabetes mellitus with diabetic chronic kidney disease: Secondary | ICD-10-CM | POA: Diagnosis not present

## 2018-05-13 DIAGNOSIS — I129 Hypertensive chronic kidney disease with stage 1 through stage 4 chronic kidney disease, or unspecified chronic kidney disease: Secondary | ICD-10-CM | POA: Diagnosis not present

## 2018-05-13 DIAGNOSIS — Z87440 Personal history of urinary (tract) infections: Secondary | ICD-10-CM | POA: Diagnosis not present

## 2018-05-13 DIAGNOSIS — K219 Gastro-esophageal reflux disease without esophagitis: Secondary | ICD-10-CM | POA: Diagnosis not present

## 2018-06-09 ENCOUNTER — Encounter (HOSPITAL_COMMUNITY): Payer: Self-pay | Admitting: *Deleted

## 2018-06-09 ENCOUNTER — Emergency Department (HOSPITAL_COMMUNITY): Payer: Medicare HMO

## 2018-06-09 ENCOUNTER — Inpatient Hospital Stay (HOSPITAL_COMMUNITY)
Admission: EM | Admit: 2018-06-09 | Discharge: 2018-06-17 | DRG: 492 | Disposition: A | Discharge status: Skilled Nursing Facility | Payer: Medicare HMO | Source: Skilled Nursing Facility | Attending: Internal Medicine | Admitting: Internal Medicine

## 2018-06-09 ENCOUNTER — Emergency Department (HOSPITAL_COMMUNITY): Payer: Medicare HMO | Admitting: Anesthesiology

## 2018-06-09 ENCOUNTER — Inpatient Hospital Stay (HOSPITAL_COMMUNITY): Payer: Medicare HMO

## 2018-06-09 ENCOUNTER — Encounter (HOSPITAL_COMMUNITY)
Admission: EM | Disposition: A | Discharge status: Skilled Nursing Facility | Payer: Self-pay | Source: Skilled Nursing Facility | Attending: Internal Medicine

## 2018-06-09 DIAGNOSIS — I10 Essential (primary) hypertension: Secondary | ICD-10-CM | POA: Diagnosis not present

## 2018-06-09 DIAGNOSIS — W19XXXA Unspecified fall, initial encounter: Secondary | ICD-10-CM | POA: Diagnosis present

## 2018-06-09 DIAGNOSIS — Z79890 Hormone replacement therapy (postmenopausal): Secondary | ICD-10-CM

## 2018-06-09 DIAGNOSIS — Y95 Nosocomial condition: Secondary | ICD-10-CM | POA: Diagnosis present

## 2018-06-09 DIAGNOSIS — Z9089 Acquired absence of other organs: Secondary | ICD-10-CM

## 2018-06-09 DIAGNOSIS — Z961 Presence of intraocular lens: Secondary | ICD-10-CM

## 2018-06-09 DIAGNOSIS — R4701 Aphasia: Secondary | ICD-10-CM | POA: Diagnosis not present

## 2018-06-09 DIAGNOSIS — I130 Hypertensive heart and chronic kidney disease with heart failure and stage 1 through stage 4 chronic kidney disease, or unspecified chronic kidney disease: Secondary | ICD-10-CM | POA: Diagnosis present

## 2018-06-09 DIAGNOSIS — E1140 Type 2 diabetes mellitus with diabetic neuropathy, unspecified: Secondary | ICD-10-CM

## 2018-06-09 DIAGNOSIS — J440 Chronic obstructive pulmonary disease with acute lower respiratory infection: Secondary | ICD-10-CM | POA: Diagnosis present

## 2018-06-09 DIAGNOSIS — J9811 Atelectasis: Secondary | ICD-10-CM | POA: Diagnosis present

## 2018-06-09 DIAGNOSIS — B9620 Unspecified Escherichia coli [E. coli] as the cause of diseases classified elsewhere: Secondary | ICD-10-CM | POA: Diagnosis present

## 2018-06-09 DIAGNOSIS — N136 Pyonephrosis: Secondary | ICD-10-CM | POA: Diagnosis present

## 2018-06-09 DIAGNOSIS — S82291C Other fracture of shaft of right tibia, initial encounter for open fracture type IIIA, IIIB, or IIIC: Secondary | ICD-10-CM | POA: Diagnosis present

## 2018-06-09 DIAGNOSIS — E8889 Other specified metabolic disorders: Secondary | ICD-10-CM | POA: Diagnosis present

## 2018-06-09 DIAGNOSIS — E039 Hypothyroidism, unspecified: Secondary | ICD-10-CM | POA: Diagnosis not present

## 2018-06-09 DIAGNOSIS — S82209A Unspecified fracture of shaft of unspecified tibia, initial encounter for closed fracture: Secondary | ICD-10-CM

## 2018-06-09 DIAGNOSIS — I459 Conduction disorder, unspecified: Secondary | ICD-10-CM | POA: Diagnosis present

## 2018-06-09 DIAGNOSIS — Z9841 Cataract extraction status, right eye: Secondary | ICD-10-CM

## 2018-06-09 DIAGNOSIS — S82409A Unspecified fracture of shaft of unspecified fibula, initial encounter for closed fracture: Secondary | ICD-10-CM

## 2018-06-09 DIAGNOSIS — I5032 Chronic diastolic (congestive) heart failure: Secondary | ICD-10-CM | POA: Diagnosis present

## 2018-06-09 DIAGNOSIS — G9341 Metabolic encephalopathy: Secondary | ICD-10-CM | POA: Diagnosis not present

## 2018-06-09 DIAGNOSIS — E1151 Type 2 diabetes mellitus with diabetic peripheral angiopathy without gangrene: Secondary | ICD-10-CM | POA: Diagnosis present

## 2018-06-09 DIAGNOSIS — J189 Pneumonia, unspecified organism: Secondary | ICD-10-CM | POA: Diagnosis not present

## 2018-06-09 DIAGNOSIS — S82201C Unspecified fracture of shaft of right tibia, initial encounter for open fracture type IIIA, IIIB, or IIIC: Secondary | ICD-10-CM | POA: Diagnosis not present

## 2018-06-09 DIAGNOSIS — I351 Nonrheumatic aortic (valve) insufficiency: Secondary | ICD-10-CM | POA: Diagnosis not present

## 2018-06-09 DIAGNOSIS — E785 Hyperlipidemia, unspecified: Secondary | ICD-10-CM | POA: Diagnosis present

## 2018-06-09 DIAGNOSIS — F0390 Unspecified dementia without behavioral disturbance: Secondary | ICD-10-CM | POA: Diagnosis present

## 2018-06-09 DIAGNOSIS — S82201R Unspecified fracture of shaft of right tibia, subsequent encounter for open fracture type IIIA, IIIB, or IIIC with malunion: Secondary | ICD-10-CM | POA: Diagnosis not present

## 2018-06-09 DIAGNOSIS — S82491C Other fracture of shaft of right fibula, initial encounter for open fracture type IIIA, IIIB, or IIIC: Secondary | ICD-10-CM | POA: Diagnosis present

## 2018-06-09 DIAGNOSIS — E8770 Fluid overload, unspecified: Secondary | ICD-10-CM | POA: Diagnosis not present

## 2018-06-09 DIAGNOSIS — Z8673 Personal history of transient ischemic attack (TIA), and cerebral infarction without residual deficits: Secondary | ICD-10-CM

## 2018-06-09 DIAGNOSIS — Y939 Activity, unspecified: Secondary | ICD-10-CM

## 2018-06-09 DIAGNOSIS — S82401C Unspecified fracture of shaft of right fibula, initial encounter for open fracture type IIIA, IIIB, or IIIC: Secondary | ICD-10-CM | POA: Diagnosis not present

## 2018-06-09 DIAGNOSIS — Z833 Family history of diabetes mellitus: Secondary | ICD-10-CM

## 2018-06-09 DIAGNOSIS — Y9289 Other specified places as the place of occurrence of the external cause: Secondary | ICD-10-CM | POA: Diagnosis not present

## 2018-06-09 DIAGNOSIS — G92 Toxic encephalopathy: Secondary | ICD-10-CM | POA: Diagnosis present

## 2018-06-09 DIAGNOSIS — M25571 Pain in right ankle and joints of right foot: Secondary | ICD-10-CM | POA: Diagnosis present

## 2018-06-09 DIAGNOSIS — Z794 Long term (current) use of insulin: Secondary | ICD-10-CM

## 2018-06-09 DIAGNOSIS — I350 Nonrheumatic aortic (valve) stenosis: Secondary | ICD-10-CM | POA: Diagnosis present

## 2018-06-09 DIAGNOSIS — T148XXA Other injury of unspecified body region, initial encounter: Secondary | ICD-10-CM

## 2018-06-09 DIAGNOSIS — D62 Acute posthemorrhagic anemia: Secondary | ICD-10-CM | POA: Diagnosis not present

## 2018-06-09 DIAGNOSIS — R29707 NIHSS score 7: Secondary | ICD-10-CM | POA: Diagnosis not present

## 2018-06-09 DIAGNOSIS — Z66 Do not resuscitate: Secondary | ICD-10-CM | POA: Diagnosis present

## 2018-06-09 DIAGNOSIS — Z7982 Long term (current) use of aspirin: Secondary | ICD-10-CM

## 2018-06-09 DIAGNOSIS — N183 Chronic kidney disease, stage 3 unspecified: Secondary | ICD-10-CM | POA: Diagnosis present

## 2018-06-09 DIAGNOSIS — S82201B Unspecified fracture of shaft of right tibia, initial encounter for open fracture type I or II: Secondary | ICD-10-CM | POA: Diagnosis present

## 2018-06-09 DIAGNOSIS — S82401R Unspecified fracture of shaft of right fibula, subsequent encounter for open fracture type IIIA, IIIB, or IIIC with malunion: Secondary | ICD-10-CM | POA: Diagnosis not present

## 2018-06-09 DIAGNOSIS — I4891 Unspecified atrial fibrillation: Secondary | ICD-10-CM | POA: Diagnosis present

## 2018-06-09 DIAGNOSIS — D649 Anemia, unspecified: Secondary | ICD-10-CM | POA: Diagnosis not present

## 2018-06-09 DIAGNOSIS — Z9842 Cataract extraction status, left eye: Secondary | ICD-10-CM

## 2018-06-09 DIAGNOSIS — E1122 Type 2 diabetes mellitus with diabetic chronic kidney disease: Secondary | ICD-10-CM | POA: Diagnosis present

## 2018-06-09 DIAGNOSIS — N179 Acute kidney failure, unspecified: Secondary | ICD-10-CM | POA: Diagnosis present

## 2018-06-09 DIAGNOSIS — S82401B Unspecified fracture of shaft of right fibula, initial encounter for open fracture type I or II: Secondary | ICD-10-CM

## 2018-06-09 DIAGNOSIS — N390 Urinary tract infection, site not specified: Secondary | ICD-10-CM | POA: Diagnosis not present

## 2018-06-09 DIAGNOSIS — M810 Age-related osteoporosis without current pathological fracture: Secondary | ICD-10-CM | POA: Diagnosis present

## 2018-06-09 DIAGNOSIS — E038 Other specified hypothyroidism: Secondary | ICD-10-CM | POA: Diagnosis not present

## 2018-06-09 DIAGNOSIS — Z888 Allergy status to other drugs, medicaments and biological substances status: Secondary | ICD-10-CM

## 2018-06-09 HISTORY — PX: IM NAILING TIBIA: SUR734

## 2018-06-09 HISTORY — PX: I&D EXTREMITY: SHX5045

## 2018-06-09 HISTORY — DX: Unspecified fracture of shaft of unspecified fibula, initial encounter for closed fracture: S82.409A

## 2018-06-09 HISTORY — PX: TIBIA IM NAIL INSERTION: SHX2516

## 2018-06-09 HISTORY — DX: Unspecified fracture of shaft of unspecified tibia, initial encounter for closed fracture: S82.209A

## 2018-06-09 LAB — BASIC METABOLIC PANEL
Anion gap: 10 (ref 5–15)
BUN: 65 mg/dL — ABNORMAL HIGH (ref 6–20)
CHLORIDE: 103 mmol/L (ref 101–111)
CO2: 24 mmol/L (ref 22–32)
Calcium: 10.2 mg/dL (ref 8.9–10.3)
Creatinine, Ser: 3.29 mg/dL — ABNORMAL HIGH (ref 0.44–1.00)
GFR, EST AFRICAN AMERICAN: 12 mL/min — AB (ref >60)
GFR, EST NON AFRICAN AMERICAN: 11 mL/min — AB (ref >60)
Glucose, Bld: 209 mg/dL — ABNORMAL HIGH (ref 65–99)
POTASSIUM: 4.5 mmol/L (ref 3.5–5.1)
SODIUM: 137 mmol/L (ref 135–145)

## 2018-06-09 LAB — CBC WITH DIFFERENTIAL/PLATELET
Abs Immature Granulocytes: 0.1 10*3/uL (ref 0.0–0.1)
BASOS ABS: 0 10*3/uL (ref 0.0–0.1)
Basophils Relative: 0 %
EOS ABS: 0.1 10*3/uL (ref 0.0–0.7)
Eosinophils Relative: 1 %
HEMATOCRIT: 33.7 % — AB (ref 36.0–46.0)
Hemoglobin: 10.9 g/dL — ABNORMAL LOW (ref 12.0–15.0)
IMMATURE GRANULOCYTES: 1 %
LYMPHS ABS: 2.1 10*3/uL (ref 0.7–4.0)
Lymphocytes Relative: 17 %
MCH: 32.3 pg (ref 26.0–34.0)
MCHC: 32.3 g/dL (ref 30.0–36.0)
MCV: 100 fL (ref 78.0–100.0)
Monocytes Absolute: 0.8 10*3/uL (ref 0.1–1.0)
Monocytes Relative: 7 %
NEUTROS PCT: 74 %
Neutro Abs: 9.5 10*3/uL — ABNORMAL HIGH (ref 1.7–7.7)
PLATELETS: 138 10*3/uL — AB (ref 150–400)
RBC: 3.37 MIL/uL — AB (ref 3.87–5.11)
RDW: 12.8 % (ref 11.5–15.5)
WBC: 12.6 10*3/uL — AB (ref 4.0–10.5)

## 2018-06-09 LAB — GLUCOSE, CAPILLARY
GLUCOSE-CAPILLARY: 265 mg/dL — AB (ref 65–99)
Glucose-Capillary: 257 mg/dL — ABNORMAL HIGH (ref 65–99)
Glucose-Capillary: 223 mg/dL — ABNORMAL HIGH (ref 65–99)

## 2018-06-09 LAB — HEMOGLOBIN A1C
Hgb A1c MFr Bld: 7.5 % — ABNORMAL HIGH (ref 4.8–5.6)
Mean Plasma Glucose: 168.55 mg/dL

## 2018-06-09 LAB — TSH: TSH: 7.129 u[IU]/mL — AB (ref 0.350–4.500)

## 2018-06-09 LAB — PROTIME-INR
INR: 1.07
PROTHROMBIN TIME: 13.8 s (ref 11.4–15.2)

## 2018-06-09 LAB — T4, FREE: Free T4: 1.09 ng/dL (ref 0.82–1.77)

## 2018-06-09 SURGERY — IRRIGATION AND DEBRIDEMENT EXTREMITY
Anesthesia: General | Laterality: Right

## 2018-06-09 MED ORDER — ALBUTEROL SULFATE (2.5 MG/3ML) 0.083% IN NEBU: 2.5000 mg | INHALATION_SOLUTION | RESPIRATORY_TRACT | Status: DC | PRN

## 2018-06-09 MED ORDER — OXYCODONE HCL 5 MG PO TABS
ORAL_TABLET | ORAL | Status: AC
  Filled 2018-06-09: qty 1

## 2018-06-09 MED ORDER — AMLODIPINE BESYLATE 2.5 MG PO TABS
2.5000 mg | ORAL_TABLET | Freq: Every day | ORAL | Status: DC
  Administered 2018-06-10 – 2018-06-12 (×3): 2.5 mg via ORAL
  Filled 2018-06-09 (×4): qty 1

## 2018-06-09 MED ORDER — MONTELUKAST SODIUM 10 MG PO TABS
10.0000 mg | ORAL_TABLET | Freq: Every day | ORAL | Status: DC
  Administered 2018-06-09 – 2018-06-17 (×7): 10 mg via ORAL
  Filled 2018-06-09 (×9): qty 1

## 2018-06-09 MED ORDER — ONDANSETRON HCL 4 MG/2ML IJ SOLN: 4.0000 mg | Freq: Once | INTRAMUSCULAR | Status: DC | PRN

## 2018-06-09 MED ORDER — PHENYLEPHRINE 40 MCG/ML (10ML) SYRINGE FOR IV PUSH (FOR BLOOD PRESSURE SUPPORT)
PREFILLED_SYRINGE | INTRAVENOUS | Status: DC | PRN
  Administered 2018-06-09 (×2): 80 ug via INTRAVENOUS
  Administered 2018-06-09: 40 ug via INTRAVENOUS
  Administered 2018-06-09: 80 ug via INTRAVENOUS
  Administered 2018-06-09: 40 ug via INTRAVENOUS

## 2018-06-09 MED ORDER — DOCUSATE SODIUM 100 MG PO CAPS
100.0000 mg | ORAL_CAPSULE | Freq: Two times a day (BID) | ORAL | Status: DC
  Administered 2018-06-09 – 2018-06-17 (×7): 100 mg via ORAL
  Filled 2018-06-09 (×10): qty 1

## 2018-06-09 MED ORDER — METOCLOPRAMIDE HCL 5 MG PO TABS: 5.0000 mg | ORAL_TABLET | Freq: Three times a day (TID) | ORAL | Status: DC | PRN

## 2018-06-09 MED ORDER — PRAVASTATIN SODIUM 20 MG PO TABS
20.0000 mg | ORAL_TABLET | Freq: Every day | ORAL | Status: DC
  Administered 2018-06-09 – 2018-06-17 (×8): 20 mg via ORAL
  Filled 2018-06-09 (×9): qty 1

## 2018-06-09 MED ORDER — MORPHINE SULFATE (PF) 2 MG/ML IV SOLN: 2.0000 mg | INTRAVENOUS | Status: DC | PRN

## 2018-06-09 MED ORDER — ASPIRIN 81 MG PO TABS: 81.0000 mg | ORAL_TABLET | Freq: Every day | ORAL | Status: DC

## 2018-06-09 MED ORDER — PANTOPRAZOLE SODIUM 40 MG PO TBEC
40.0000 mg | DELAYED_RELEASE_TABLET | Freq: Every day | ORAL | Status: DC
  Administered 2018-06-09 – 2018-06-17 (×8): 40 mg via ORAL
  Filled 2018-06-09 (×9): qty 1

## 2018-06-09 MED ORDER — ONDANSETRON HCL 4 MG/2ML IJ SOLN: 4.0000 mg | Freq: Four times a day (QID) | INTRAMUSCULAR | Status: DC | PRN

## 2018-06-09 MED ORDER — CEFAZOLIN SODIUM-DEXTROSE 1-4 GM/50ML-% IV SOLN
1.0000 g | INTRAVENOUS | Status: DC
  Filled 2018-06-09: qty 50

## 2018-06-09 MED ORDER — LIDOCAINE 2% (20 MG/ML) 5 ML SYRINGE
INTRAMUSCULAR | Status: DC | PRN
  Administered 2018-06-09: 60 mg via INTRAVENOUS

## 2018-06-09 MED ORDER — PREGABALIN 100 MG PO CAPS
100.0000 mg | ORAL_CAPSULE | Freq: Every day | ORAL | Status: DC
  Administered 2018-06-09 – 2018-06-11 (×3): 100 mg via ORAL
  Filled 2018-06-09 (×3): qty 1

## 2018-06-09 MED ORDER — INSULIN GLARGINE 100 UNIT/ML ~~LOC~~ SOLN
7.0000 [IU] | Freq: Every day | SUBCUTANEOUS | Status: DC
  Administered 2018-06-10 – 2018-06-16 (×6): 7 [IU] via SUBCUTANEOUS
  Filled 2018-06-09 (×10): qty 0.07

## 2018-06-09 MED ORDER — SENNOSIDES-DOCUSATE SODIUM 8.6-50 MG PO TABS
1.0000 | ORAL_TABLET | Freq: Every day | ORAL | Status: DC
  Administered 2018-06-09 – 2018-06-17 (×5): 1 via ORAL
  Filled 2018-06-09 (×11): qty 1

## 2018-06-09 MED ORDER — VANCOMYCIN HCL IN DEXTROSE 1-5 GM/200ML-% IV SOLN
1000.0000 mg | Freq: Once | INTRAVENOUS | Status: AC
  Administered 2018-06-09: 1000 mg via INTRAVENOUS
  Filled 2018-06-09: qty 200

## 2018-06-09 MED ORDER — ASPIRIN EC 325 MG PO TBEC
325.0000 mg | DELAYED_RELEASE_TABLET | Freq: Every day | ORAL | Status: DC
  Administered 2018-06-10 – 2018-06-17 (×6): 325 mg via ORAL
  Filled 2018-06-09 (×8): qty 1

## 2018-06-09 MED ORDER — INSULIN ASPART 100 UNIT/ML ~~LOC~~ SOLN
0.0000 [IU] | Freq: Three times a day (TID) | SUBCUTANEOUS | Status: DC
  Administered 2018-06-09: 8 [IU] via SUBCUTANEOUS
  Administered 2018-06-10 (×2): 5 [IU] via SUBCUTANEOUS
  Administered 2018-06-11: 2 [IU] via SUBCUTANEOUS
  Administered 2018-06-11: 3 [IU] via SUBCUTANEOUS

## 2018-06-09 MED ORDER — SODIUM CHLORIDE 0.9 % IV SOLN
2.0000 g | Freq: Once | INTRAVENOUS | Status: DC
  Filled 2018-06-09: qty 2

## 2018-06-09 MED ORDER — MORPHINE SULFATE (PF) 2 MG/ML IV SOLN: 0.5000 mg | INTRAVENOUS | Status: DC | PRN

## 2018-06-09 MED ORDER — METOCLOPRAMIDE HCL 5 MG/ML IJ SOLN: 5.0000 mg | Freq: Three times a day (TID) | INTRAMUSCULAR | Status: DC | PRN

## 2018-06-09 MED ORDER — FENTANYL CITRATE (PF) 100 MCG/2ML IJ SOLN
INTRAMUSCULAR | Status: AC
  Filled 2018-06-09: qty 2

## 2018-06-09 MED ORDER — POVIDONE-IODINE 10 % EX SWAB: 2.0000 "application " | Freq: Once | CUTANEOUS | Status: DC

## 2018-06-09 MED ORDER — OXYCODONE HCL 5 MG PO TABS
5.0000 mg | ORAL_TABLET | Freq: Once | ORAL | Status: AC | PRN
  Administered 2018-06-09: 5 mg via ORAL

## 2018-06-09 MED ORDER — TOBRAMYCIN SULFATE 80 MG/2ML IJ SOLN
INTRAMUSCULAR | Status: DC | PRN
  Administered 2018-06-09: 160 mg

## 2018-06-09 MED ORDER — DEXAMETHASONE SODIUM PHOSPHATE 10 MG/ML IJ SOLN
INTRAMUSCULAR | Status: AC
  Filled 2018-06-09: qty 1

## 2018-06-09 MED ORDER — PROPOFOL 10 MG/ML IV BOLUS
INTRAVENOUS | Status: DC | PRN
  Administered 2018-06-09: 50 mg via INTRAVENOUS
  Administered 2018-06-09 (×2): 30 mg via INTRAVENOUS
  Administered 2018-06-09: 20 mg via INTRAVENOUS

## 2018-06-09 MED ORDER — FENTANYL CITRATE (PF) 250 MCG/5ML IJ SOLN
INTRAMUSCULAR | Status: AC
  Filled 2018-06-09: qty 5

## 2018-06-09 MED ORDER — FERROUS SULFATE 325 (65 FE) MG PO TABS
325.0000 mg | ORAL_TABLET | Freq: Every day | ORAL | Status: DC
  Administered 2018-06-10 – 2018-06-15 (×6): 325 mg via ORAL
  Filled 2018-06-09 (×6): qty 1

## 2018-06-09 MED ORDER — INSULIN ASPART 100 UNIT/ML ~~LOC~~ SOLN
SUBCUTANEOUS | Status: AC
  Administered 2018-06-09: 8 [IU] via SUBCUTANEOUS
  Filled 2018-06-09: qty 1

## 2018-06-09 MED ORDER — IPRATROPIUM BROMIDE 0.06 % NA SOLN
2.0000 | Freq: Three times a day (TID) | NASAL | Status: DC
  Administered 2018-06-10 – 2018-06-14 (×4): 2 via NASAL
  Filled 2018-06-09: qty 15

## 2018-06-09 MED ORDER — VANCOMYCIN HCL IN DEXTROSE 1-5 GM/200ML-% IV SOLN
1000.0000 mg | INTRAVENOUS | Status: DC
  Administered 2018-06-09: 1000 mg via INTRAVENOUS

## 2018-06-09 MED ORDER — DEXTROSE 5 % IV SOLN
INTRAVENOUS | Status: DC | PRN
  Administered 2018-06-09: 50 ug/min via INTRAVENOUS

## 2018-06-09 MED ORDER — FENTANYL CITRATE (PF) 100 MCG/2ML IJ SOLN
INTRAMUSCULAR | Status: DC | PRN
  Administered 2018-06-09 (×4): 25 ug via INTRAVENOUS

## 2018-06-09 MED ORDER — OXYCODONE HCL 5 MG/5ML PO SOLN: 5.0000 mg | Freq: Once | ORAL | Status: AC | PRN

## 2018-06-09 MED ORDER — ONDANSETRON HCL 4 MG PO TABS: 4.0000 mg | ORAL_TABLET | Freq: Four times a day (QID) | ORAL | Status: DC | PRN

## 2018-06-09 MED ORDER — ACETAMINOPHEN 650 MG RE SUPP: 650.0000 mg | Freq: Four times a day (QID) | RECTAL | Status: DC | PRN

## 2018-06-09 MED ORDER — CEFAZOLIN SODIUM-DEXTROSE 2-4 GM/100ML-% IV SOLN
2.0000 g | INTRAVENOUS | Status: AC
  Administered 2018-06-09: 2 g via INTRAVENOUS
  Filled 2018-06-09: qty 100

## 2018-06-09 MED ORDER — ONDANSETRON HCL 4 MG/2ML IJ SOLN
INTRAMUSCULAR | Status: DC | PRN
  Administered 2018-06-09: 4 mg via INTRAVENOUS

## 2018-06-09 MED ORDER — LACTATED RINGERS IV SOLN: INTRAVENOUS | Status: DC | PRN

## 2018-06-09 MED ORDER — LEVOTHYROXINE SODIUM 88 MCG PO TABS
88.0000 ug | ORAL_TABLET | Freq: Every day | ORAL | Status: DC
  Administered 2018-06-10: 88 ug via ORAL
  Filled 2018-06-09: qty 1

## 2018-06-09 MED ORDER — ACETAMINOPHEN 325 MG PO TABS: 650.0000 mg | ORAL_TABLET | Freq: Four times a day (QID) | ORAL | Status: DC | PRN

## 2018-06-09 MED ORDER — LIDOCAINE 2% (20 MG/ML) 5 ML SYRINGE
INTRAMUSCULAR | Status: AC
  Filled 2018-06-09: qty 5

## 2018-06-09 MED ORDER — VANCOMYCIN HCL 500 MG IV SOLR
INTRAVENOUS | Status: DC | PRN
  Administered 2018-06-09: 500 mg via TOPICAL

## 2018-06-09 MED ORDER — ACETAMINOPHEN 500 MG PO TABS
500.0000 mg | ORAL_TABLET | Freq: Two times a day (BID) | ORAL | Status: DC
  Administered 2018-06-09 – 2018-06-11 (×5): 500 mg via ORAL
  Filled 2018-06-09 (×5): qty 1

## 2018-06-09 MED ORDER — VANCOMYCIN HCL 500 MG IV SOLR
INTRAVENOUS | Status: AC
  Filled 2018-06-09: qty 500

## 2018-06-09 MED ORDER — GLYCOPYRROLATE 0.2 MG/ML IJ SOLN
INTRAMUSCULAR | Status: DC | PRN
  Administered 2018-06-09 (×2): 0.2 mg via INTRAVENOUS

## 2018-06-09 MED ORDER — FENTANYL CITRATE (PF) 100 MCG/2ML IJ SOLN: 25.0000 ug | INTRAMUSCULAR | Status: DC | PRN

## 2018-06-09 MED ORDER — ENOXAPARIN SODIUM 30 MG/0.3ML ~~LOC~~ SOLN
30.0000 mg | SUBCUTANEOUS | Status: DC
  Administered 2018-06-10 – 2018-06-17 (×7): 30 mg via SUBCUTANEOUS
  Filled 2018-06-09 (×7): qty 0.3

## 2018-06-09 MED ORDER — POLYETHYLENE GLYCOL 3350 17 G PO PACK
17.0000 g | PACK | Freq: Every day | ORAL | Status: DC
  Administered 2018-06-09 – 2018-06-13 (×4): 17 g via ORAL
  Filled 2018-06-09 (×4): qty 1

## 2018-06-09 MED ORDER — ACETAMINOPHEN 325 MG PO TABS: 325.0000 mg | ORAL_TABLET | Freq: Four times a day (QID) | ORAL | Status: DC | PRN

## 2018-06-09 MED ORDER — HYDROCODONE-ACETAMINOPHEN 5-325 MG PO TABS: 1.0000 | ORAL_TABLET | ORAL | Status: DC | PRN

## 2018-06-09 MED ORDER — EPHEDRINE SULFATE 50 MG/ML IJ SOLN
INTRAMUSCULAR | Status: AC
  Filled 2018-06-09: qty 1

## 2018-06-09 MED ORDER — LACTATED RINGERS IV SOLN
INTRAVENOUS | Status: AC
  Administered 2018-06-09: 22:00:00 via INTRAVENOUS

## 2018-06-09 MED ORDER — METOPROLOL TARTRATE 25 MG PO TABS
25.0000 mg | ORAL_TABLET | Freq: Two times a day (BID) | ORAL | Status: DC
  Administered 2018-06-09 – 2018-06-12 (×6): 25 mg via ORAL
  Filled 2018-06-09 (×7): qty 1

## 2018-06-09 MED ORDER — DOCUSATE SODIUM 100 MG PO CAPS: 100.0000 mg | ORAL_CAPSULE | Freq: Two times a day (BID) | ORAL | Status: DC

## 2018-06-09 MED ORDER — CHLORHEXIDINE GLUCONATE 4 % EX LIQD
60.0000 mL | Freq: Once | CUTANEOUS | Status: DC
  Filled 2018-06-09 (×2): qty 60

## 2018-06-09 MED ORDER — EPHEDRINE SULFATE 50 MG/ML IJ SOLN
INTRAMUSCULAR | Status: DC | PRN
  Administered 2018-06-09: 5 mg via INTRAVENOUS

## 2018-06-09 MED ORDER — SODIUM CHLORIDE 0.9 % IV SOLN
INTRAVENOUS | Status: DC
  Administered 2018-06-09: 11:00:00 via INTRAVENOUS

## 2018-06-09 MED ORDER — ONDANSETRON HCL 4 MG/2ML IJ SOLN
INTRAMUSCULAR | Status: AC
  Filled 2018-06-09: qty 2

## 2018-06-09 MED ORDER — PHENYLEPHRINE 40 MCG/ML (10ML) SYRINGE FOR IV PUSH (FOR BLOOD PRESSURE SUPPORT)
PREFILLED_SYRINGE | INTRAVENOUS | Status: AC
  Filled 2018-06-09: qty 10

## 2018-06-09 MED ORDER — TOBRAMYCIN SULFATE 80 MG/2ML IJ SOLN
INTRAMUSCULAR | Status: AC
  Filled 2018-06-09: qty 4

## 2018-06-09 MED ORDER — FENTANYL CITRATE (PF) 100 MCG/2ML IJ SOLN
INTRAMUSCULAR | Status: AC | PRN
  Administered 2018-06-09: 100 ug via INTRAVENOUS

## 2018-06-09 MED ORDER — DEXAMETHASONE SODIUM PHOSPHATE 10 MG/ML IJ SOLN
INTRAMUSCULAR | Status: DC | PRN
  Administered 2018-06-09: 10 mg via INTRAVENOUS

## 2018-06-09 SURGICAL SUPPLY — 82 items
BANDAGE ACE 4X5 VEL STRL LF (GAUZE/BANDAGES/DRESSINGS) ×3 IMPLANT
BANDAGE ACE 6X5 VEL STRL LF (GAUZE/BANDAGES/DRESSINGS) ×3 IMPLANT
BANDAGE ESMARK 6X9 LF (GAUZE/BANDAGES/DRESSINGS) ×1 IMPLANT
BIT DRILL 4.4 (MISCELLANEOUS) ×2 IMPLANT
BIT DRILL 6X3.8 (MISCELLANEOUS) ×3 IMPLANT
BNDG CMPR 9X6 STRL LF SNTH (GAUZE/BANDAGES/DRESSINGS) ×1
BNDG COHESIVE 4X5 TAN STRL (GAUZE/BANDAGES/DRESSINGS) ×3 IMPLANT
BNDG COHESIVE 6X5 TAN STRL LF (GAUZE/BANDAGES/DRESSINGS) ×3 IMPLANT
BNDG ESMARK 6X9 LF (GAUZE/BANDAGES/DRESSINGS) ×3
BNDG GAUZE ELAST 4 BULKY (GAUZE/BANDAGES/DRESSINGS) ×6 IMPLANT
BOOTCOVER CLEANROOM LRG (PROTECTIVE WEAR) ×6 IMPLANT
CLEANER TIP ELECTROSURG 2X2 (MISCELLANEOUS) ×3 IMPLANT
COVER SURGICAL LIGHT HANDLE (MISCELLANEOUS) ×3 IMPLANT
CUFF TOURNIQUET SINGLE 18IN (TOURNIQUET CUFF) IMPLANT
CUFF TOURNIQUET SINGLE 24IN (TOURNIQUET CUFF) IMPLANT
CUFF TOURNIQUET SINGLE 34IN LL (TOURNIQUET CUFF) IMPLANT
DRAPE C-ARM 42X72 X-RAY (DRAPES) IMPLANT
DRAPE U-SHAPE 47X51 STRL (DRAPES) ×3 IMPLANT
DRSG ADAPTIC 3X8 NADH LF (GAUZE/BANDAGES/DRESSINGS) ×3 IMPLANT
DRSG MEPITEL 4X7.2 (GAUZE/BANDAGES/DRESSINGS) ×3 IMPLANT
DRSG VAC ATS SM SENSATRAC (GAUZE/BANDAGES/DRESSINGS) ×2 IMPLANT
DURAPREP 26ML APPLICATOR (WOUND CARE) ×3 IMPLANT
ELECT REM PT RETURN 9FT ADLT (ELECTROSURGICAL) ×3
ELECTRODE REM PT RTRN 9FT ADLT (ELECTROSURGICAL) ×1 IMPLANT
EVACUATOR 1/8 PVC DRAIN (DRAIN) IMPLANT
GAUZE SPONGE 4X4 12PLY STRL (GAUZE/BANDAGES/DRESSINGS) ×3 IMPLANT
GAUZE XEROFORM 1X8 LF (GAUZE/BANDAGES/DRESSINGS) ×3 IMPLANT
GLOVE BIOGEL PI IND STRL 7.5 (GLOVE) ×1 IMPLANT
GLOVE BIOGEL PI INDICATOR 7.5 (GLOVE) ×2
GLOVE BIOGEL PI ORTHO PRO SZ8 (GLOVE) ×4
GLOVE ECLIPSE 8.0 STRL XLNG CF (GLOVE) ×2 IMPLANT
GLOVE ORTHO TXT STRL SZ7.5 (GLOVE) ×3 IMPLANT
GLOVE PI ORTHO PRO STRL SZ8 (GLOVE) ×2 IMPLANT
GLOVE SURG ORTHO 8.0 STRL STRW (GLOVE) ×6 IMPLANT
GOWN STRL REUS W/ TWL LRG LVL3 (GOWN DISPOSABLE) IMPLANT
GOWN STRL REUS W/ TWL XL LVL3 (GOWN DISPOSABLE) ×1 IMPLANT
GOWN STRL REUS W/TWL 2XL LVL3 (GOWN DISPOSABLE) IMPLANT
GOWN STRL REUS W/TWL LRG LVL3 (GOWN DISPOSABLE)
GOWN STRL REUS W/TWL XL LVL3 (GOWN DISPOSABLE) ×3
GUIDEWIRE BALL NOSE 80CM (WIRE) ×2 IMPLANT
HANDPIECE INTERPULSE COAX TIP (DISPOSABLE)
KIT BASIN OR (CUSTOM PROCEDURE TRAY) ×3 IMPLANT
KIT INFUSE LRG II (Orthopedic Implant) ×3 IMPLANT
KIT STIMULAN RAPID CURE  10CC (Orthopedic Implant) ×2 IMPLANT
KIT STIMULAN RAPID CURE 10CC (Orthopedic Implant) ×1 IMPLANT
KIT TURNOVER KIT B (KITS) ×3 IMPLANT
MANIFOLD NEPTUNE II (INSTRUMENTS) ×3 IMPLANT
MICROMATRIX 500MG (Tissue) ×3 IMPLANT
NAIL TIBIAL 9MMX33CM (Nail) ×2 IMPLANT
NEEDLE 22X1 1/2 (OR ONLY) (NEEDLE) IMPLANT
NS IRRIG 1000ML POUR BTL (IV SOLUTION) ×3 IMPLANT
PACK ORTHO EXTREMITY (CUSTOM PROCEDURE TRAY) ×3 IMPLANT
PAD ARMBOARD 7.5X6 YLW CONV (MISCELLANEOUS) ×6 IMPLANT
PADDING CAST COTTON 6X4 STRL (CAST SUPPLIES) ×9 IMPLANT
PIN GUIDE 3.2X14 1401214 (MISCELLANEOUS) ×3 IMPLANT
SCREW ACECAP 38MM (Screw) ×2 IMPLANT
SCREW ACECAP 46MM (Screw) ×2 IMPLANT
SCREW CORTICAL 5.5 35MM (Screw) ×2 IMPLANT
SCREW PROXIMAL DEPUY (Screw) ×3 IMPLANT
SCREW PRXML FT 55X5.5XNS TIB (Screw) IMPLANT
SET HNDPC FAN SPRY TIP SCT (DISPOSABLE) IMPLANT
SOLUTION PARTIC MCRMTRX 500MG (Tissue) ×1 IMPLANT
SPONGE LAP 18X18 X RAY DECT (DISPOSABLE) ×3 IMPLANT
STAPLER VISISTAT 35W (STAPLE) IMPLANT
STOCKINETTE IMPERVIOUS 9X36 MD (GAUZE/BANDAGES/DRESSINGS) ×3 IMPLANT
STOCKINETTE IMPERVIOUS LG (DRAPES) ×3 IMPLANT
SUCTION FRAZIER HANDLE 10FR (MISCELLANEOUS)
SUCTION TUBE FRAZIER 10FR DISP (MISCELLANEOUS) IMPLANT
SUT ETHILON 3 0 PS 1 (SUTURE) IMPLANT
SUT VIC AB 0 CT1 27 (SUTURE) ×6
SUT VIC AB 0 CT1 27XBRD ANBCTR (SUTURE) ×2 IMPLANT
SUT VIC AB 2-0 CT1 27 (SUTURE) ×6
SUT VIC AB 2-0 CT1 TAPERPNT 27 (SUTURE) ×2 IMPLANT
SWAB CULTURE ESWAB REG 1ML (MISCELLANEOUS) IMPLANT
SYR CONTROL 10ML LL (SYRINGE) IMPLANT
TOWEL OR 17X24 6PK STRL BLUE (TOWEL DISPOSABLE) ×6 IMPLANT
TOWEL OR 17X26 10 PK STRL BLUE (TOWEL DISPOSABLE) ×6 IMPLANT
TUBE CONNECTING 12'X1/4 (SUCTIONS) ×1
TUBE CONNECTING 12X1/4 (SUCTIONS) ×2 IMPLANT
UNDERPAD 30X30 (UNDERPADS AND DIAPERS) ×3 IMPLANT
WATER STERILE IRR 1000ML POUR (IV SOLUTION) ×6 IMPLANT
YANKAUER SUCT BULB TIP NO VENT (SUCTIONS) ×3 IMPLANT

## 2018-06-09 NOTE — ED Provider Notes (Signed)
[name] Provider Note   CSN: [id number] Arrival date & time: 06/09/18  0109     History   Chief Complaint Chief Complaint  Patient presents with  . Fall    HPI [name] is a 84 y.o. female.  She was found down at her facility after being seen awake without problems at 7 AM.  Likely she had fallen.  Patient has no recollection of events.  She is complaining of right ankle pain only.  She is very difficult historian level 5 caveat secondary to dementia.  Primary history of by EMS.  They found the patient on the ground with an open right ankle fracture which they immobilized in position and gave fentanyl in route.  No other obvious signs of injury, neck immobilized with towel roll.  HPI  Past Medical History:  Diagnosis Date  . Chronic kidney disease (CKD), stage III (moderate) ([place])    [name] 12/06/2015  . History of blood transfusion    "when my son was born; maybe once since" (04/30/2016)  . Hypertension   . Hypothyroidism   . Pneumonia 1930s; ? 11/2015  . Tuberculosis 1930s  . Type II diabetes mellitus ([place]) since 1964    Patient Active Problem List   Diagnosis Date Noted  . Pyelonephritis 12/27/2017  . ARF (acute renal failure) ([place]) 12/27/2017  . Corns and callosities 12/17/2016  . Palpitations 05/08/2016  . Mobitz type 1 second degree AV block 05/08/2016  . Trifascicular block 05/08/2016  . Protein-calorie malnutrition, severe 05/01/2016  . Hypertensive urgency 04/30/2016  . Hypertensive urgency, malignant 04/30/2016  . Sepsis due to pneumonia ([place]) 12/06/2015  . Acute respiratory failure with hypoxia ([place]) 12/06/2015  . Controlled diabetes mellitus with diabetic neuropathy, with long-term current use of insulin ([place]) 12/06/2015  . Dyslipidemia associated with type 2 diabetes mellitus ([place]) 12/06/2015  . Leukocytosis 12/06/2015  . Thrombocytopenia ([place]) 12/06/2015  . COPD exacerbation ([place]) 12/06/2015  .  Cerebellar stroke ([place]) 03/06/2015  . Chronic kidney disease, stage III (moderate) ([place]) 08/11/2013  . Hypothyroidism 03/15/2013  . Diabetic neuropathy ([place]) 03/15/2013    Past Surgical History:  Procedure Laterality Date  . CATARACT EXTRACTION W/ INTRAOCULAR LENS  IMPLANT, BILATERAL Bilateral   . ESOPHAGOGASTRODUODENOSCOPY N/A 03/03/2015   Procedure: ESOPHAGOGASTRODUODENOSCOPY (EGD);  Surgeon: Inda Castle, MD;  Location: [facility];  Service: Endoscopy;  Laterality: N/A;  . ESOPHAGOGASTRODUODENOSCOPY N/A 03/07/2015   Procedure: ESOPHAGOGASTRODUODENOSCOPY (EGD);  Surgeon: Jerene Bears, MD;  Location: [facility] ENDOSCOPY;  Service: Endoscopy;  Laterality: N/A;  . FRACTURE SURGERY    . HIP FRACTURE SURGERY Bilateral   . TONSILLECTOMY       OB History   None      Home Medications    Prior to Admission medications   Medication Sig Start Date End Date Taking? Authorizing Provider  acetaminophen (MAPAP) 325 MG tablet Take 650 mg by mouth every 6 (six) hours as needed (for fever).    [provider]  albuterol (VENTOLIN HFA) 108 (90 Base) MCG/ACT inhaler Inhale 2 puffs into the lungs every 4 (four) hours as needed for wheezing or shortness of breath.    [provider]  amLODipine (NORVASC) 5 MG tablet Take 1 tablet (5 mg total) by mouth daily. 12/28/17   Donne Hazel, MD  aspirin 81 MG tablet Take 81 mg by mouth daily.    [provider]  docusate sodium (COLACE) 100 MG capsule Take 1 capsule (100 mg total) by mouth  2 (two) times daily. 12/28/17   Donne Hazel, MD  ferrous sulfate 325 (65 FE) MG tablet Take 325 mg by mouth daily with breakfast.    [provider]  glimepiride (AMARYL) 2 MG tablet TAKE 1 TABLET BY MOUTH EVERY MORNING AND 1/2 TABLET AT [facility] Patient taking differently: Take 2 mg by mouth in the morning and 1 mg in the evening 07/17/15   Elayne Snare, MD  insulin aspart (NOVOLOG FLEXPEN) 100 UNIT/ML FlexPen If blood sugar is over 200,  take 5 units and if they go over 300, take 6 units at that meal. Patient taking differently: Inject 4-8 Units into the skin See admin instructions. 4-8 units three times a day before meals per sliding scale: BGL 150-200 = 4 units; 201-250 = 6 units; >250 = 8 units AND ADD 4 UNITS TO SLIDING SCALE DOSE WITH EVERY BREAKFAST 02/16/15   Elayne Snare, MD  ipratropium (ATROVENT) 0.03 % nasal spray Place 2 sprays into both nostrils 3 (three) times daily before meals.    [provider]  LYRICA 75 MG capsule TAKE 1 CAPSULE TWICE DAILY Patient not taking: Reported on 12/26/2017 01/29/16   Elayne Snare, MD  metoprolol tartrate (LOPRESSOR) 25 MG tablet Take 1 tablet (25 mg total) by mouth 2 (two) times daily. 12/28/17 12/28/18  Donne Hazel, MD  Multiple Vitamin (DAILY-VITE PO) Take 1 tablet by mouth daily.    [provider]  pantoprazole (PROTONIX) 40 MG tablet TAKE 1 TABLET BY MOUTH TWICE DAILY Patient taking differently: Take 40 mg by mouth in the morning 09/08/15   Elayne Snare, MD  pravastatin (PRAVACHOL) 20 MG tablet Take 20 mg by mouth daily. 06/25/15   [provider]  pregabalin (LYRICA) 100 MG capsule Take 100 mg by mouth See admin instructions. 100 mg once a day at 7 PM    [provider]  Probiotic Product ([place]) CAPS Take 1 capsule by mouth daily.    [provider]  sennosides-docusate sodium (SENOKOT-S) 8.6-50 MG tablet Take 1 tablet by mouth daily.     [provider]  SYNTHROID 88 MCG tablet TAKE 1 TABLET DAILY Patient taking differently: Take 88 mcg by  mouth once a day 07/10/16   Elayne Snare, MD  traMADol (ULTRAM) 50 MG tablet Take 1 tablet (50 mg total) by mouth 2 (two) times daily. Patient taking differently: Take 50 mg by mouth See admin instructions. 50 mg once a day AS NEEDED for moderate pain and 50 mg at bedtime SCHEDULED 09/15/15   Elayne Snare, MD  vitamin B-12 (CYANOCOBALAMIN) 1000 MCG tablet Take 1,000 mcg by mouth  daily.    [provider]    Family History Family History  Problem Relation Age of Onset  . Diabetes Father     Social History Social History   Tobacco Use  . Smoking status: Never Smoker  . Smokeless tobacco: Never Used  Substance Use Topics  . Alcohol use: Yes    Comment: "I have had some alcohol in my younger days; a long time ago""  . Drug use: No     Allergies   Capsaicin   Review of Systems Review of Systems  Unable to perform ROS: Dementia     Physical Exam Updated Vital Signs Temp 99.1 F (37.3 C) (Oral)   SpO2 100%   Physical Exam  Constitutional: She appears well-developed and well-nourished.  HENT:  Head: Normocephalic and atraumatic.  Right Ear: External ear normal.  Left Ear:  External ear normal.  Nose: Nose normal.  Mouth/Throat: Oropharynx is clear and moist.  Eyes: Conjunctivae are normal. Right eye exhibits no discharge. Left eye exhibits no discharge.  Neck: No tracheal deviation present.  immobilized with towelroll, no stepoff  Cardiovascular: Normal rate. An irregularly irregular rhythm present.  Murmur heard.  Systolic murmur is present with a grade of 5/6. Pulmonary/Chest: Effort normal and breath sounds normal.  Abdominal: Soft. There is no tenderness. There is no guarding.  Musculoskeletal: She exhibits deformity. She exhibits no edema.  She has an open tib-fib fracture of her right lower extremity with the foot completely rotated 180 degrees.  Foot is warm although unable to palpate pulse.  Neurological: She is alert.  Patient is alert very hard of hearing difficult to get a neuro exam on.  She is moving all 4 extremities spontaneously.  She seems to recognize family.  Skin: Skin is warm and dry. Capillary refill takes less than 2 seconds.     ED Treatments / Results  Labs (all labs ordered are listed, but only abnormal results are displayed) Labs Reviewed  BASIC METABOLIC PANEL - Abnormal; Notable for the following  components:      Result Value   Glucose, Bld 209 (*)    BUN 65 (*)    Creatinine, Ser 3.29 (*)    GFR calc non Af Amer 11 (*)    GFR calc Af Amer 12 (*)    All other components within normal limits  CBC WITH DIFFERENTIAL/PLATELET - Abnormal; Notable for the following components:   WBC 12.6 (*)    RBC 3.37 (*)    Hemoglobin 10.9 (*)    HCT 33.7 (*)    Platelets 138 (*)    Neutro Abs 9.5 (*)    All other components within normal limits  TSH - Abnormal; Notable for the following components:   TSH 7.129 (*)    All other components within normal limits  GLUCOSE, CAPILLARY - Abnormal; Notable for the following components:   Glucose-Capillary 265 (*)    All other components within normal limits  GLUCOSE, CAPILLARY - Abnormal; Notable for the following components:   Glucose-Capillary 257 (*)    All other components within normal limits  HEMOGLOBIN A1C - Abnormal; Notable for the following components:   Hgb A1c MFr Bld 7.5 (*)    All other components within normal limits  GLUCOSE, CAPILLARY - Abnormal; Notable for the following components:   Glucose-Capillary 223 (*)    All other components within normal limits  CULTURE, BLOOD (ROUTINE X 2)  PROTIME-INR  T4, FREE  BASIC METABOLIC PANEL  CBC  TYPE AND SCREEN    EKG EKG Interpretation  Date/Time:  Tuesday June 09 2018 08:28:47 EDT Ventricular Rate:  84 PR Interval:    QRS Duration: 144 QT Interval:  423 QTC Calculation: 495 R Axis:   -122 Text Interpretation:  Atrial fibrillation Paired ventricular premature complexes Right bundle branch block Probable lateral infarct, old Baseline wander in lead(s) V3 poor baseline afib new from prior 5/17 Confirmed by Aletta Edouard [phone]) on 06/09/2018 9:43:14 AM   Radiology Dg Tibia/fibula Right  Result Date: 06/09/2018 CLINICAL DATA:  Lower leg fractures.  Tibial intramedullary nail. EXAM: RIGHT TIBIA AND FIBULA - 2 VIEW; DG C-ARM 61-120 MIN FLUOROSCOPY TIME:  1 minutes and 4 seconds  C-arm fluoroscopic images were obtained intraoperatively and submitted for post operative interpretation. Please see the performing provider's procedural report for the fluoroscopy time utilized. COMPARISON:  Radiographs earlier  the same date. FINDINGS: Six spot fluoroscopic images are submitted. These demonstrate the placement of a tibial intramedullary nail, secured by 2 proximal and 2 distal interlocking screws. There is near anatomic reduction of the oblique fracture of the distal tibial diaphysis. The alignment of the fibular fracture is also significantly improved. No perioperative complications. IMPRESSION: Improved alignment of the tibial and fibular fractures post tibial intramedullary nail fixation. No demonstrated complication. Electronically Signed   By: Richardean Sale M.D.   On: 06/09/2018 13:49   Dg Tibia/fibula Right  Result Date: 06/09/2018 CLINICAL DATA:  Pain following fall EXAM: RIGHT TIBIA AND FIBULA - 2 VIEW COMPARISON:  None. FINDINGS: Frontal and lateral views were obtained. There are fractures of each distal tibia and fibula. The distal tibial fracture is located approximately 9 cm proximal to the tibial plafond. This fracture is obliquely oriented with lateral and anterior displacement as well as anterior angulation distally. There is a comminuted fracture of the distal fibula at this same level. There is lateral and anterior displacement as well as anterior angulation of the distal major fracture fragment with respect proximal fragment. In addition, there is a fracture of the proximal fibular metaphysis with slight impaction at the fracture site. No other fractures are evident. No dislocation. There is moderate osteoarthritic change in the knee and ankle regions. IMPRESSION: Displaced fractures of the distal tibia and fibula as described. Fracture of the proximal fibular metaphysis with mild impaction and near anatomic alignment. No dislocations. Osteoarthritic change in the knee and  ankle regions noted. Electronically Signed   By: Lowella Grip III M.D.   On: 06/09/2018 09:33   Dg Ankle 2 Views Right  Result Date: 06/09/2018 CLINICAL DATA:  [place] EXAM: RIGHT ANKLE - 2 VIEW COMPARISON:  None. FINDINGS: Oblique fracture through the distal tibial shaft. Transverse fracture through the distal fibular shaft. There is nearly 90 degrees angulation, at least 90 degrees rotation, and greater than shaft width displacement of distal fracture fragments. There is soft tissue irregularity suggesting open fracture. Tibiotalar articulation appears preserved on this single projection. IMPRESSION: 1. Distal tibial and fibular shaft fractures with significant angulation, rotation, and displacement of fracture fragments. Electronically Signed   By: Lucrezia Europe M.D.   On: 06/09/2018 09:12   Dg Ankle Complete Right  Result Date: 06/09/2018 CLINICAL DATA:  Pain following fall EXAM: RIGHT ANKLE - COMPLETE 3+ VIEW COMPARISON:  Right tibia and fibula radiographs June 09, 2018 FINDINGS: Frontal, oblique, and lateral views were obtained. There are displaced fractures of the distal tibia and fibula located approximately 9 cm proximal to the tibial plafond. In the ankle region, there is no appreciable fracture or joint effusion. There is mild joint space narrowing laterally. There is an inferior calcaneal spur. No erosive change. IMPRESSION: Fractures of the distal tibia and fibula with lateral aunt and anterior displacement as well as anterior angulation of distal fracture fragments, described in the report of the right tibia and fibula. In the ankle region, no fracture evident. Ankle mortise appears intact. Electronically Signed   By: Lowella Grip III M.D.   On: 06/09/2018 09:36   Ct Head Wo Contrast  Result Date: 06/09/2018 CLINICAL DATA:  Unwitnessed fall.  History of underlying dementia EXAM: CT HEAD WITHOUT CONTRAST CT CERVICAL SPINE WITHOUT CONTRAST TECHNIQUE: Multidetector CT imaging of the head  and cervical spine was performed following the standard protocol without intravenous contrast. Multiplanar CT image reconstructions of the cervical spine were also generated. COMPARISON:  Head CT May 24, 2016 FINDINGS: CT HEAD FINDINGS Brain: There is moderate diffuse atrophy. There is no intracranial mass, hemorrhage, extra-axial fluid collection, or midline shift. There is evidence of a prior infarct in the left frontal lobe superiorly, not present on prior study from 2017. There is evidence of a prior infarct in the mid right cerebellum, stable. There is patchy small vessel disease in the centra semiovale bilaterally, stable. There is evidence of a prior small infarct in the mid right external capsule region, stable. There is a focal small infarct at the junction of the right lentiform nucleus and inferior centrum semiovale on the right, stable. There is a prior appearing small infarct in the mid left thalamus. There is no acute appearing infarct evident on this study. Vascular: No appreciable hyperdense vessel. There is calcification in the left distal vertebral artery as well as in both carotid siphon regions. Skull: Bony calvarium appears intact. There is a right parietal scalp hematoma. There is arthropathy in each temporomandibular joint. Sinuses/Orbits: There is mucosal thickening in several ethmoid air cells. Other paranasal sinuses are clear. Orbits appear symmetric bilaterally. Other: Mastoids on the right are clear. There is opacification of several inferior mastoid air cells on the left, stable from prior study. CT CERVICAL SPINE FINDINGS Alignment: There is 2 mm of retrolisthesis of C4 on C5. There is no other appreciable spondylolisthesis. Skull base and vertebrae: Skull base and craniocervical junction regions appear normal. There is moderate pannus posterior to the odontoid, not causing appreciable impression on the underlying craniocervical junction. No fracture is appreciable. There are no  blastic or lytic bone lesions. Soft tissues and spinal canal: Prevertebral soft tissues and predental space regions are normal. There is bony hypertrophy posteriorly at several levels, not causing high-grade stenosis. No cord canal hematoma evident. No paraspinous lesions. Disc levels: There is moderate disc space narrowing at C3-4. There is mild disc space narrowing at C4-5. Other disc spaces appear unremarkable. There is multilevel facet hypertrophy. No disc extrusion or stenosis. Upper chest: There is pleural effusion on the left, free-flowing. There is scarring in each lung apex. Other: There is calcification in each subclavian artery as well as in each carotid artery. IMPRESSION: CT head: 1. Atrophy with scattered prior infarcts and small vessel disease. No acute infarct evident. No mass or hemorrhage. 2.   Foci of arterial vascular calcification noted. 3. Parietal scalp hematoma noted on the right. No bony abnormality. 4. Mucosal thickening noted in several ethmoid air cells. Chronic inferior left mastoid air cell disease. 5.  Arthropathy noted in each temporomandibular joint. CT cervical spine: 1. No demonstrable fracture. Mild spondylolisthesis at C4-5 is felt to be due to underlying spondylosis. 2. There is osteoarthritic change at several levels. No frank disc extrusion or stenosis. 3.  Free-flowing pleural effusion on the left. 4.  Calcification in both subclavian and carotid arteries. Electronically Signed   By: Lowella Grip III M.D.   On: 06/09/2018 10:19   Ct Cervical Spine Wo Contrast  Result Date: 06/09/2018 CLINICAL DATA:  Unwitnessed fall.  History of underlying dementia EXAM: CT HEAD WITHOUT CONTRAST CT CERVICAL SPINE WITHOUT CONTRAST TECHNIQUE: Multidetector CT imaging of the head and cervical spine was performed following the standard protocol without intravenous contrast. Multiplanar CT image reconstructions of the cervical spine were also generated. COMPARISON:  Head CT May 24, 2016  FINDINGS: CT HEAD FINDINGS Brain: There is moderate diffuse atrophy. There is no intracranial mass, hemorrhage, extra-axial fluid collection, or midline shift. There is evidence of a prior  infarct in the left frontal lobe superiorly, not present on prior study from 2017. There is evidence of a prior infarct in the mid right cerebellum, stable. There is patchy small vessel disease in the centra semiovale bilaterally, stable. There is evidence of a prior small infarct in the mid right external capsule region, stable. There is a focal small infarct at the junction of the right lentiform nucleus and inferior centrum semiovale on the right, stable. There is a prior appearing small infarct in the mid left thalamus. There is no acute appearing infarct evident on this study. Vascular: No appreciable hyperdense vessel. There is calcification in the left distal vertebral artery as well as in both carotid siphon regions. Skull: Bony calvarium appears intact. There is a right parietal scalp hematoma. There is arthropathy in each temporomandibular joint. Sinuses/Orbits: There is mucosal thickening in several ethmoid air cells. Other paranasal sinuses are clear. Orbits appear symmetric bilaterally. Other: Mastoids on the right are clear. There is opacification of several inferior mastoid air cells on the left, stable from prior study. CT CERVICAL SPINE FINDINGS Alignment: There is 2 mm of retrolisthesis of C4 on C5. There is no other appreciable spondylolisthesis. Skull base and vertebrae: Skull base and craniocervical junction regions appear normal. There is moderate pannus posterior to the odontoid, not causing appreciable impression on the underlying craniocervical junction. No fracture is appreciable. There are no blastic or lytic bone lesions. Soft tissues and spinal canal: Prevertebral soft tissues and predental space regions are normal. There is bony hypertrophy posteriorly at several levels, not causing high-grade  stenosis. No cord canal hematoma evident. No paraspinous lesions. Disc levels: There is moderate disc space narrowing at C3-4. There is mild disc space narrowing at C4-5. Other disc spaces appear unremarkable. There is multilevel facet hypertrophy. No disc extrusion or stenosis. Upper chest: There is pleural effusion on the left, free-flowing. There is scarring in each lung apex. Other: There is calcification in each subclavian artery as well as in each carotid artery. IMPRESSION: CT head: 1. Atrophy with scattered prior infarcts and small vessel disease. No acute infarct evident. No mass or hemorrhage. 2.   Foci of arterial vascular calcification noted. 3. Parietal scalp hematoma noted on the right. No bony abnormality. 4. Mucosal thickening noted in several ethmoid air cells. Chronic inferior left mastoid air cell disease. 5.  Arthropathy noted in each temporomandibular joint. CT cervical spine: 1. No demonstrable fracture. Mild spondylolisthesis at C4-5 is felt to be due to underlying spondylosis. 2. There is osteoarthritic change at several levels. No frank disc extrusion or stenosis. 3.  Free-flowing pleural effusion on the left. 4.  Calcification in both subclavian and carotid arteries. Electronically Signed   By: Lowella Grip III M.D.   On: 06/09/2018 10:19   Dg Chest Port 1 View  Addendum Date: 06/09/2018   ADDENDUM REPORT: 06/09/2018 10:21 ADDENDUM: There is a left pleural effusion. Electronically Signed   By: Lowella Grip III M.D.   On: 06/09/2018 10:21   Result Date: 06/09/2018 CLINICAL DATA:  Pain following fall EXAM: PORTABLE CHEST 1 VIEW COMPARISON:  Apr 30, 2016 FINDINGS: There is airspace consolidation in the right upper lobe consistent with a degree of pneumonia. The lungs elsewhere are clear. Heart is upper normal in size with pulmonary vascularity normal. No adenopathy. Bones are osteoporotic. No pneumothorax. No fracture. There is bilateral carotid artery calcification. IMPRESSION:  Airspace consolidation right upper lobe consistent with pneumonia. Lungs elsewhere clear. Stable cardiac silhouette. Bones osteoporotic. No evident  pneumothorax. There is calcification in each carotid artery. Electronically Signed: By: Lowella Grip III M.D. On: 06/09/2018 09:34   Dg Tibia/fibula Right Port  Result Date: 06/09/2018 CLINICAL DATA:  Open tibial fracture fixation EXAM: PORTABLE RIGHT TIBIA AND FIBULA - 2 VIEW COMPARISON:  Intraoperative study from 06/09/2018 FINDINGS: Intramedullary nail fixation of the right tibia is noted without postoperative complicating features noted. A wound VAC is seen along the medial aspect of the distal right leg with antibiotic impregnated beads projecting over the distal third of the tibial diaphysis anteriorly. Fracture of the fibular neck and junction of the middle and distal third are noted as before. IMPRESSION: 1. IM nail fixation of open tibial fracture with wound vac device along the medial aspect of the distal right leg noted and antibiotic impregnated beads projecting over the anterior aspect of the distal right leg. Alignment is near anatomic. 2. Fibular neck and distal fibular diaphyseal fractures without significant displacement as before. Electronically Signed   By: Ashley Royalty M.D.   On: 06/09/2018 20:00   Dg C-arm 1-60 Min  Result Date: 06/09/2018 CLINICAL DATA:  Lower leg fractures.  Tibial intramedullary nail. EXAM: RIGHT TIBIA AND FIBULA - 2 VIEW; DG C-ARM 61-120 MIN FLUOROSCOPY TIME:  1 minutes and 4 seconds C-arm fluoroscopic images were obtained intraoperatively and submitted for post operative interpretation. Please see the performing provider's procedural report for the fluoroscopy time utilized. COMPARISON:  Radiographs earlier the same date. FINDINGS: Six spot fluoroscopic images are submitted. These demonstrate the placement of a tibial intramedullary nail, secured by 2 proximal and 2 distal interlocking screws. There is near anatomic  reduction of the oblique fracture of the distal tibial diaphysis. The alignment of the fibular fracture is also significantly improved. No perioperative complications. IMPRESSION: Improved alignment of the tibial and fibular fractures post tibial intramedullary nail fixation. No demonstrated complication. Electronically Signed   By: Richardean Sale M.D.   On: 06/09/2018 13:49    Procedures Procedures (including critical care time)  Medications Ordered in ED Medications  fentaNYL (SUBLIMAZE) 100 MCG/2ML injection (has no administration in time range)     Initial Impression / Assessment and Plan / ED Course  I have reviewed the triage vital signs and the nursing notes.  Pertinent labs & imaging results that were available during my care of the patient were reviewed by me and considered in my medical decision making (see chart for details).  Clinical Course as of Jun 09 929  Tue Jun 09, 2018  0905 Mr. Gorden Harms from orthopedics down to evaluate patient.  With fentanyl as pain control we reduced her open tib-fib fracture.  She remained having a positive DP pulse.  Foot remained pink and warm.   [MB]  0906 She remained with some tibia outside the skin which was covered by wet sterile saline and dressing and placed in a posterior short leg splint.  Son is here and we will review findings and likely she will need to go to the OR for next fix.  Medicine hospitalist been paged for admitting   [MB]  [phone] Ortho attending is to be Dr. Mardelle Matte.   [MB]  [phone] Since son Kaci Dillie is here and he was updated on status of the patient.   [MB]    Clinical Course User Index [MB] Hayden Rasmussen, MD     Final Clinical Impressions(s) / ED Diagnoses   Final diagnoses:  Tibia/fibula fracture, right, open type I or II, initial encounter  Fall, initial encounter  ED Discharge Orders    None       Hayden Rasmussen, MD 06/10/18 [phone]

## 2018-06-09 NOTE — ED Notes (Signed)
PA Silvestre Gunner with Orthopedics transferred to Dr. Melina Copa per his request

## 2018-06-09 NOTE — ED Notes (Signed)
[name], [place] at bedside and completed reduction of L tib/ L fib, ortho tech at bedside splinting foot, Melina Copa, MD at bedside

## 2018-06-09 NOTE — H&P (Signed)
History and Physical    [name] GBT:[id number] DOB: 1915-04-10 DOA: 06/09/2018  PCP: Leanna Battles, MD  Patient coming from:  [facility]   Chief Complaint:  fall  HPI: [name] is a 84 y.o. female with medical history significant of DM; hypothyroidism; HTN; and CKD stage 3 presenting with a fall at SNF.  She thinks she got up and had a mechanical fall.  Her leg was clearly broken with an open and very dislocated fracture.     ED Course:  Open tib-fib fracture.  Needs med management.  Review of Systems: As per HPI; otherwise review of systems reviewed and negative.    Past Medical History:  Diagnosis Date  . Chronic kidney disease (CKD), stage III (moderate) ([place])    [name] 12/06/2015  . History of blood transfusion    "when my son was born; maybe once since" (04/30/2016)  . Hypertension   . Hypothyroidism   . Pneumonia 1930s; ? 11/2015  . Tuberculosis 1930s  . Type II diabetes mellitus ([place]) since 1964    Past Surgical History:  Procedure Laterality Date  . CATARACT EXTRACTION W/ INTRAOCULAR LENS  IMPLANT, BILATERAL Bilateral   . ESOPHAGOGASTRODUODENOSCOPY N/A 03/03/2015   Procedure: ESOPHAGOGASTRODUODENOSCOPY (EGD);  Surgeon: Inda Castle, MD;  Location: [facility];  Service: Endoscopy;  Laterality: N/A;  . ESOPHAGOGASTRODUODENOSCOPY N/A 03/07/2015   Procedure: ESOPHAGOGASTRODUODENOSCOPY (EGD);  Surgeon: Jerene Bears, MD;  Location: [facility];  Service: Endoscopy;  Laterality: N/A;  . FRACTURE SURGERY    . HIP FRACTURE SURGERY Bilateral   . TONSILLECTOMY      Social History   Socioeconomic History  . Marital status: Widowed    Spouse name: Not on file  . Number of children: Not on file  . Years of education: Not on file  . Highest education level: Not on file  Occupational History  . Not on file  Social Needs  . Financial resource strain: Not on file  . Food insecurity:    Worry: Not on file    Inability: Not on file  .  Transportation needs:    Medical: Not on file    Non-medical: Not on file  Tobacco Use  . Smoking status: Never Smoker  . Smokeless tobacco: Never Used  Substance and Sexual Activity  . Alcohol use: Yes    Comment: "I have had some alcohol in my younger days; a long time ago""  . Drug use: No  . Sexual activity: Not on file  Lifestyle  . Physical activity:    Days per week: Not on file    Minutes per session: Not on file  . Stress: Not on file  Relationships  . Social connections:    Talks on phone: Not on file    Gets together: Not on file    Attends religious service: Not on file    Active member of club or organization: Not on file    Attends meetings of clubs or organizations: Not on file    Relationship status: Not on file  . Intimate partner violence:    Fear of current or ex partner: Not on file    Emotionally abused: Not on file    Physically abused: Not on file    Forced sexual activity: Not on file  Other Topics Concern  . Not on file  Social History Narrative  . Not on file    Allergies  Allergen Reactions  . Capsaicin Other (See Comments)  Topical application BURNED the skin    Family History  Problem Relation Age of Onset  . Diabetes Father     Prior to Admission medications   Medication Sig Start Date End Date Taking? Authorizing Provider  acetaminophen (MAPAP) 325 MG tablet Take 650 mg by mouth every 6 (six) hours as needed (for fever).    [provider]  albuterol (VENTOLIN HFA) 108 (90 Base) MCG/ACT inhaler Inhale 2 puffs into the lungs every 4 (four) hours as needed for wheezing or shortness of breath.    [provider]  amLODipine (NORVASC) 5 MG tablet Take 1 tablet (5 mg total) by mouth daily. 12/28/17   Donne Hazel, MD  aspirin 81 MG tablet Take 81 mg by mouth daily.    [provider]  docusate sodium (COLACE) 100 MG capsule Take 1 capsule (100 mg total) by mouth 2 (two) times daily. 12/28/17   Donne Hazel, MD  ferrous sulfate 325 (65 FE) MG tablet Take 325 mg by mouth daily with breakfast.    [provider]  glimepiride (AMARYL) 2 MG tablet TAKE 1 TABLET BY MOUTH EVERY MORNING AND 1/2 TABLET AT [facility] Patient taking differently: Take 2 mg by mouth in the morning and 1 mg in the evening 07/17/15   Elayne Snare, MD  insulin aspart (NOVOLOG FLEXPEN) 100 UNIT/ML FlexPen If blood sugar is over 200, take 5 units and if they go over 300, take 6 units at that meal. Patient taking differently: Inject 4-8 Units into the skin See admin instructions. 4-8 units three times a day before meals per sliding scale: BGL 150-200 = 4 units; 201-250 = 6 units; >250 = 8 units AND ADD 4 UNITS TO SLIDING SCALE DOSE WITH EVERY BREAKFAST 02/16/15   Elayne Snare, MD  ipratropium (ATROVENT) 0.03 % nasal spray Place 2 sprays into both nostrils 3 (three) times daily before meals.    [provider]  LYRICA 75 MG capsule TAKE 1 CAPSULE TWICE DAILY Patient not taking: Reported on 12/26/2017 01/29/16   Elayne Snare, MD  metoprolol tartrate (LOPRESSOR) 25 MG tablet Take 1 tablet (25 mg total) by mouth 2 (two) times daily. 12/28/17 12/28/18  Donne Hazel, MD  Multiple Vitamin (DAILY-VITE PO) Take 1 tablet by mouth daily.    [provider]  pantoprazole (PROTONIX) 40 MG tablet TAKE 1 TABLET BY MOUTH TWICE DAILY Patient taking differently: Take 40 mg by mouth in the morning 09/08/15   Elayne Snare, MD  pravastatin (PRAVACHOL) 20 MG tablet Take 20 mg by mouth daily. 06/25/15   [provider]  pregabalin (LYRICA) 100 MG capsule Take 100 mg by mouth See admin instructions. 100 mg once a day at 7 PM    [provider]  Probiotic Product ([place]) CAPS Take 1 capsule by mouth daily.    [provider]  sennosides-docusate sodium (SENOKOT-S) 8.6-50 MG tablet Take 1 tablet by mouth daily.     [provider]  SYNTHROID 88 MCG tablet TAKE 1 TABLET DAILY Patient taking  differently: Take 88 mcg by  mouth once a day 07/10/16   Elayne Snare, MD  traMADol (ULTRAM) 50 MG tablet Take 1 tablet (50 mg total) by mouth 2 (two) times daily. Patient taking differently: Take 50 mg by mouth See admin instructions. 50 mg once a day AS NEEDED for moderate pain and 50 mg at bedtime SCHEDULED 09/15/15   Elayne Snare, MD  vitamin B-12 (CYANOCOBALAMIN) 1000 MCG tablet Take  1,000 mcg by mouth daily.    [provider]    Physical Exam: Vitals:   06/09/18 0900 06/09/18 0915 06/09/18 0930 06/09/18 0941  BP: (!) 175/51 (!) 150/81 (!) 196/92   Pulse: 64 61 (!) 49 (!) 55  Resp: 11 (!) 22 14 (!) 22  Temp:      TempSrc:      SpO2: 100% 98% 100% 100%  Weight:    68.7 kg (151 lb 7.3 oz)  Height:    5\' 7"  (1.702 m)     General:  Appears calm and comfortable and is NAD Eyes:  EOMI, normal lids, iris ENT:  grossly normal hearing, lips & tongue, mmm Neck:  no LAD, masses or thyromegaly; no carotid bruits Cardiovascular:  RRR, no m/r/g. No LE edema.  Respiratory:   CTA bilaterally with no wheezes/rales/rhonchi.  Normal respiratory effort. Abdomen:  soft, NT, ND, NABS Skin:  no rash or induration seen on limited exam Musculoskeletal: Right foot immobilized in a splint at the time of my exam with blood on her toes but apparently good capillary refill Psychiatric:  grossly normal mood and affect, speech fluent and appropriate Neurologic: unable to perform - patient preparing for surgery    Radiological Exams on Admission: Dg Tibia/fibula Right  Result Date: 06/09/2018 CLINICAL DATA:  Pain following fall EXAM: RIGHT TIBIA AND FIBULA - 2 VIEW COMPARISON:  None. FINDINGS: Frontal and lateral views were obtained. There are fractures of each distal tibia and fibula. The distal tibial fracture is located approximately 9 cm proximal to the tibial plafond. This fracture is obliquely oriented with lateral and anterior displacement as well as anterior angulation distally. There is a  comminuted fracture of the distal fibula at this same level. There is lateral and anterior displacement as well as anterior angulation of the distal major fracture fragment with respect proximal fragment. In addition, there is a fracture of the proximal fibular metaphysis with slight impaction at the fracture site. No other fractures are evident. No dislocation. There is moderate osteoarthritic change in the knee and ankle regions. IMPRESSION: Displaced fractures of the distal tibia and fibula as described. Fracture of the proximal fibular metaphysis with mild impaction and near anatomic alignment. No dislocations. Osteoarthritic change in the knee and ankle regions noted. Electronically Signed   By: Lowella Grip III M.D.   On: 06/09/2018 09:33   Dg Ankle 2 Views Right  Result Date: 06/09/2018 CLINICAL DATA:  [place] EXAM: RIGHT ANKLE - 2 VIEW COMPARISON:  None. FINDINGS: Oblique fracture through the distal tibial shaft. Transverse fracture through the distal fibular shaft. There is nearly 90 degrees angulation, at least 90 degrees rotation, and greater than shaft width displacement of distal fracture fragments. There is soft tissue irregularity suggesting open fracture. Tibiotalar articulation appears preserved on this single projection. IMPRESSION: 1. Distal tibial and fibular shaft fractures with significant angulation, rotation, and displacement of fracture fragments. Electronically Signed   By: Lucrezia Europe M.D.   On: 06/09/2018 09:12   Dg Ankle Complete Right  Result Date: 06/09/2018 CLINICAL DATA:  Pain following fall EXAM: RIGHT ANKLE - COMPLETE 3+ VIEW COMPARISON:  Right tibia and fibula radiographs June 09, 2018 FINDINGS: Frontal, oblique, and lateral views were obtained. There are displaced fractures of the distal tibia and fibula located approximately 9 cm proximal to the tibial plafond. In the ankle region, there is no appreciable fracture or joint effusion. There is mild joint space narrowing  laterally. There is an inferior calcaneal spur. No  erosive change. IMPRESSION: Fractures of the distal tibia and fibula with lateral aunt and anterior displacement as well as anterior angulation of distal fracture fragments, described in the report of the right tibia and fibula. In the ankle region, no fracture evident. Ankle mortise appears intact. Electronically Signed   By: Lowella Grip III M.D.   On: 06/09/2018 09:36   Ct Head Wo Contrast  Result Date: 06/09/2018 CLINICAL DATA:  Unwitnessed fall.  History of underlying dementia EXAM: CT HEAD WITHOUT CONTRAST CT CERVICAL SPINE WITHOUT CONTRAST TECHNIQUE: Multidetector CT imaging of the head and cervical spine was performed following the standard protocol without intravenous contrast. Multiplanar CT image reconstructions of the cervical spine were also generated. COMPARISON:  Head CT May 24, 2016 FINDINGS: CT HEAD FINDINGS Brain: There is moderate diffuse atrophy. There is no intracranial mass, hemorrhage, extra-axial fluid collection, or midline shift. There is evidence of a prior infarct in the left frontal lobe superiorly, not present on prior study from 2017. There is evidence of a prior infarct in the mid right cerebellum, stable. There is patchy small vessel disease in the centra semiovale bilaterally, stable. There is evidence of a prior small infarct in the mid right external capsule region, stable. There is a focal small infarct at the junction of the right lentiform nucleus and inferior centrum semiovale on the right, stable. There is a prior appearing small infarct in the mid left thalamus. There is no acute appearing infarct evident on this study. Vascular: No appreciable hyperdense vessel. There is calcification in the left distal vertebral artery as well as in both carotid siphon regions. Skull: Bony calvarium appears intact. There is a right parietal scalp hematoma. There is arthropathy in each temporomandibular joint. Sinuses/Orbits: There  is mucosal thickening in several ethmoid air cells. Other paranasal sinuses are clear. Orbits appear symmetric bilaterally. Other: Mastoids on the right are clear. There is opacification of several inferior mastoid air cells on the left, stable from prior study. CT CERVICAL SPINE FINDINGS Alignment: There is 2 mm of retrolisthesis of C4 on C5. There is no other appreciable spondylolisthesis. Skull base and vertebrae: Skull base and craniocervical junction regions appear normal. There is moderate pannus posterior to the odontoid, not causing appreciable impression on the underlying craniocervical junction. No fracture is appreciable. There are no blastic or lytic bone lesions. Soft tissues and spinal canal: Prevertebral soft tissues and predental space regions are normal. There is bony hypertrophy posteriorly at several levels, not causing high-grade stenosis. No cord canal hematoma evident. No paraspinous lesions. Disc levels: There is moderate disc space narrowing at C3-4. There is mild disc space narrowing at C4-5. Other disc spaces appear unremarkable. There is multilevel facet hypertrophy. No disc extrusion or stenosis. Upper chest: There is pleural effusion on the left, free-flowing. There is scarring in each lung apex. Other: There is calcification in each subclavian artery as well as in each carotid artery. IMPRESSION: CT head: 1. Atrophy with scattered prior infarcts and small vessel disease. No acute infarct evident. No mass or hemorrhage. 2.   Foci of arterial vascular calcification noted. 3. Parietal scalp hematoma noted on the right. No bony abnormality. 4. Mucosal thickening noted in several ethmoid air cells. Chronic inferior left mastoid air cell disease. 5.  Arthropathy noted in each temporomandibular joint. CT cervical spine: 1. No demonstrable fracture. Mild spondylolisthesis at C4-5 is felt to be due to underlying spondylosis. 2. There is osteoarthritic change at several levels. No frank disc  extrusion or stenosis. 3.  Free-flowing pleural effusion on the left. 4.  Calcification in both subclavian and carotid arteries. Electronically Signed   By: Lowella Grip III M.D.   On: 06/09/2018 10:19   Ct Cervical Spine Wo Contrast  Result Date: 06/09/2018 CLINICAL DATA:  Unwitnessed fall.  History of underlying dementia EXAM: CT HEAD WITHOUT CONTRAST CT CERVICAL SPINE WITHOUT CONTRAST TECHNIQUE: Multidetector CT imaging of the head and cervical spine was performed following the standard protocol without intravenous contrast. Multiplanar CT image reconstructions of the cervical spine were also generated. COMPARISON:  Head CT May 24, 2016 FINDINGS: CT HEAD FINDINGS Brain: There is moderate diffuse atrophy. There is no intracranial mass, hemorrhage, extra-axial fluid collection, or midline shift. There is evidence of a prior infarct in the left frontal lobe superiorly, not present on prior study from 2017. There is evidence of a prior infarct in the mid right cerebellum, stable. There is patchy small vessel disease in the centra semiovale bilaterally, stable. There is evidence of a prior small infarct in the mid right external capsule region, stable. There is a focal small infarct at the junction of the right lentiform nucleus and inferior centrum semiovale on the right, stable. There is a prior appearing small infarct in the mid left thalamus. There is no acute appearing infarct evident on this study. Vascular: No appreciable hyperdense vessel. There is calcification in the left distal vertebral artery as well as in both carotid siphon regions. Skull: Bony calvarium appears intact. There is a right parietal scalp hematoma. There is arthropathy in each temporomandibular joint. Sinuses/Orbits: There is mucosal thickening in several ethmoid air cells. Other paranasal sinuses are clear. Orbits appear symmetric bilaterally. Other: Mastoids on the right are clear. There is opacification of several inferior  mastoid air cells on the left, stable from prior study. CT CERVICAL SPINE FINDINGS Alignment: There is 2 mm of retrolisthesis of C4 on C5. There is no other appreciable spondylolisthesis. Skull base and vertebrae: Skull base and craniocervical junction regions appear normal. There is moderate pannus posterior to the odontoid, not causing appreciable impression on the underlying craniocervical junction. No fracture is appreciable. There are no blastic or lytic bone lesions. Soft tissues and spinal canal: Prevertebral soft tissues and predental space regions are normal. There is bony hypertrophy posteriorly at several levels, not causing high-grade stenosis. No cord canal hematoma evident. No paraspinous lesions. Disc levels: There is moderate disc space narrowing at C3-4. There is mild disc space narrowing at C4-5. Other disc spaces appear unremarkable. There is multilevel facet hypertrophy. No disc extrusion or stenosis. Upper chest: There is pleural effusion on the left, free-flowing. There is scarring in each lung apex. Other: There is calcification in each subclavian artery as well as in each carotid artery. IMPRESSION: CT head: 1. Atrophy with scattered prior infarcts and small vessel disease. No acute infarct evident. No mass or hemorrhage. 2.   Foci of arterial vascular calcification noted. 3. Parietal scalp hematoma noted on the right. No bony abnormality. 4. Mucosal thickening noted in several ethmoid air cells. Chronic inferior left mastoid air cell disease. 5.  Arthropathy noted in each temporomandibular joint. CT cervical spine: 1. No demonstrable fracture. Mild spondylolisthesis at C4-5 is felt to be due to underlying spondylosis. 2. There is osteoarthritic change at several levels. No frank disc extrusion or stenosis. 3.  Free-flowing pleural effusion on the left. 4.  Calcification in both subclavian and carotid arteries. Electronically Signed   By: Lowella Grip III M.D.   On: 06/09/2018 10:19  Dg Chest Port 1 View  Addendum Date: 06/09/2018   ADDENDUM REPORT: 06/09/2018 10:21 ADDENDUM: There is a left pleural effusion. Electronically Signed   By: Lowella Grip III M.D.   On: 06/09/2018 10:21   Result Date: 06/09/2018 CLINICAL DATA:  Pain following fall EXAM: PORTABLE CHEST 1 VIEW COMPARISON:  Apr 30, 2016 FINDINGS: There is airspace consolidation in the right upper lobe consistent with a degree of pneumonia. The lungs elsewhere are clear. Heart is upper normal in size with pulmonary vascularity normal. No adenopathy. Bones are osteoporotic. No pneumothorax. No fracture. There is bilateral carotid artery calcification. IMPRESSION: Airspace consolidation right upper lobe consistent with pneumonia. Lungs elsewhere clear. Stable cardiac silhouette. Bones osteoporotic. No evident pneumothorax. There is calcification in each carotid artery. Electronically Signed: By: Lowella Grip III M.D. On: 06/09/2018 09:34    EKG: Independently reviewed.  Afib with rate 84, new since last visit; nonspecific ST changes with no evidence of acute ischemia   Labs on Admission: I have personally reviewed the available labs and imaging studies at the time of the admission.  Pertinent labs:   Glucose 209 BUN 65/Creatinine 3.29/GFR 12; 45/2.62/16 on 12/28/17 - appears to be relatively close to baseline WBC 12.6 Hgb 10.9; prior 12.5 on 12/28/17 Platelets 138; 167 on 12/28/17 INR 1.07  Assessment/Plan Principal Problem:   Tibia and fibula open fracture, right Active Problems:   Hypothyroidism   Chronic kidney disease, stage III (moderate) (HCC)   Controlled diabetes mellitus with diabetic neuropathy, with long-term current use of insulin (HCC)   Unspecified atrial fibrillation (HCC)   Essential hypertension   Open tib-fib fracture -Mechanical fall resulting in open tib-fib fracture -Orthopedics consult  -NPO in anticipation of surgical repair -Start Lovenox post-operatively (or as per  ortho) -Pain control with Morphine prn -SW consult for rehab placement -Will need PT consult post-operatively  Afib -Patient without known prior h/o afib, but it was seen on EKG today -This is an urgent surgery and is not particularly high-risk, but it will need to be performed regardless -She is already rate controlled, will continue BB -She would be a poor candidate for anticoagulation given her age and fall risk; would recommend ASA post-operatively  Hypothyroidism -Check TSH and free T4 - TSH elevated at 7.444 in 5/17 -Continue Synthroid at current dose for now  CKD stage 3 -Appears to be stable -Would discontinue ARB  DM -Goal for control would be 150-250, generally - to avoid highs/lows; she is not at risk for long-term complications of diabetes -Hold Amaryl and Novolog -Continue Lantus -Cover with moderate-scale SSI  HTN -Discontinue ARB, as above -Continue Norvasc, Lopressor   DVT prophylaxis: Lovenox post-operatively, if approved by orthopedics Code Status:  DNR - confirmed with patient/family Family Communication: Son, DIL present  Disposition Plan:  SNF once clinically improved Consults called: Orthopedics; SW; will need PT post-operatively  Admission status: Admit - It is my clinical opinion that admission to INPATIENT is reasonable and necessary because of the expectation that this patient will require hospital care that crosses at least 2 midnights to treat this condition based on the medical complexity of the problems presented.  Given the aforementioned information, the predictability of an adverse outcome is felt to be significant.     Karmen Bongo MD [facility]  If note is complete, please contact covering daytime or nighttime physician. [email or web address] Password [email or web address]  06/09/2018, 11:18 AM

## 2018-06-09 NOTE — Progress Notes (Signed)
Orthopedic Tech Progress Note Patient Details:  SECILIA APPS 1915/12/05 [id number]  Ortho Devices Type of Ortho Device: Post (short leg) splint Ortho Device/Splint Location: rle Ortho Device/Splint Interventions: Ordered, Application, Adjustment   Post Interventions Patient Tolerated: Well Instructions Provided: Care of device, Adjustment of device   Karolee Stamps 06/09/2018, 9:28 AM

## 2018-06-09 NOTE — Progress Notes (Signed)
Pharmacy Antibiotic Note  [name] is a 84 y.o. female admitted on 06/09/2018 with pneumonia.  Pharmacy has been consulted for vancomycin dosing. Tmax is 99.1 and WBC is elevated at 12.6. SCr is elevated at 3.29 but known history of CKD.   Plan: Vancomycin 1gm IV Q48H F/u renal fxn, C&S, clinical status and trough at [facility] F/u continuation of cefepime or other gram negative coverage  Height: 5\' 7"  (170.2 cm) Weight: 151 lb 7.3 oz (68.7 kg)(per last admission) IBW/kg (Calculated) : 61.6  Temp (24hrs), Avg:99.1 F (37.3 C), Min:99.1 F (37.3 C), Max:99.1 F (37.3 C)  Recent Labs  Lab 06/09/18 0844  WBC 12.6*  CREATININE 3.29*    Estimated Creatinine Clearance: 8.8 mL/min (A) (by C-G formula based on SCr of 3.29 mg/dL (H)).    Allergies  Allergen Reactions  . Capsaicin Other (See Comments)    Topical application BURNED the skin    Antimicrobials this admission: Vanc 6/11>> Cefepime x 1 6/11  Dose adjustments this admission: N/A  Microbiology results: Pending  Thank you for allowing pharmacy to be a part of this patient's care.  Jalise Zawistowski, Rande Lawman 06/09/2018 9:54 AM

## 2018-06-09 NOTE — Procedures (Addendum)
Procedure: Right leg closed reduction with application of short leg splint  Indication: Right leg open tibia fracture  Surgeon: Silvestre Gunner, PA-C  Assist: None  Anesthesia: None  EBL: None  Complications: None  Findings: Pt demented so consent waived given emergent nature. Pt had received 227mcg of fentanyl and was somewhat obtunded so did not pursue any further sedation. Foot was rotated into anatomic position and traction applied but could not fully reduce. Dressing applied to wound and exposed bone and splinted in place. Pt tolerated procedure well.    Lisette Abu, PA-C Orthopedic Surgery [phone]   The service was rendered under my overall direction and control, and I was immediately available via phone/ pager or present on site. Patient will proceed to OR.  Altamese Calverton, MD [facility] [phone] (p)

## 2018-06-09 NOTE — Anesthesia Preprocedure Evaluation (Addendum)
Anesthesia Evaluation  Patient identified by MRN, date of birth, ID band Patient awake    Reviewed: Allergy & Precautions, NPO status , Patient's Chart, lab work & pertinent test results, reviewed documented beta blocker date and time   History of Anesthesia Complications Negative for: history of anesthetic complications  Airway Mallampati: II  TM Distance: >3 FB Neck ROM: Full    Dental  (+) Missing, Dental Advisory Given   Pulmonary neg shortness of breath, neg sleep apnea, COPD,  COPD inhaler, neg recent URI,    breath sounds clear to auscultation       Cardiovascular hypertension, Pt. on medications and Pt. on home beta blockers + Peripheral Vascular Disease  + dysrhythmias Atrial Fibrillation + Valvular Problems/Murmurs AS  Rhythm:Irregular Rate:Bradycardia + Systolic murmurs    Neuro/Psych CVA, No Residual Symptoms negative psych ROS   GI/Hepatic Neg liver ROS, GERD  Medicated and Controlled,  Endo/Other  diabetes, Type 2, Insulin DependentHypothyroidism   Renal/GU ARF and CRFRenal disease     Musculoskeletal   Abdominal   Peds  Hematology  (+) Blood dyscrasia (Thrombocytopenia), anemia ,   Anesthesia Other Findings   Reproductive/Obstetrics                            Anesthesia Physical Anesthesia Plan  ASA: IV  Anesthesia Plan: General   Post-op Pain Management:    Induction: Intravenous  PONV Risk Score and Plan: 3 and Ondansetron, Dexamethasone and Treatment may vary due to age or medical condition  Airway Management Planned: Oral ETT and LMA  Additional Equipment: None  Intra-op Plan:   Post-operative Plan: Extubation in OR  Informed Consent: I have reviewed the patients History and Physical, chart, labs and discussed the procedure including the risks, benefits and alternatives for the proposed anesthesia with the patient or authorized representative who has indicated  his/her understanding and acceptance.   Dental advisory given  Plan Discussed with: CRNA and Surgeon  Anesthesia Plan Comments:       Anesthesia Quick Evaluation

## 2018-06-09 NOTE — Transfer of Care (Signed)
Immediate Anesthesia Transfer of Care Note  Patient: [name]  Procedure(s) Performed: IRRIGATION AND DEBRIDEMENT EXTREMITY (Right ) INTRAMEDULLARY (IM) NAIL TIBIAL (Right )  Patient Location: PACU  Anesthesia Type:General  Level of Consciousness: awake, patient cooperative and responds to stimulation  Airway & Oxygen Therapy: Patient Spontanous Breathing and Patient connected to face mask oxygen  Post-op Assessment: Report given to RN and Post -op Vital signs reviewed and stable  Post vital signs: Reviewed and stable  Last Vitals:  Vitals Value Taken Time  BP 170/77 06/09/2018  1:53 PM  Temp    Pulse 63 06/09/2018  1:58 PM  Resp 16 06/09/2018  1:58 PM  SpO2 99 % 06/09/2018  1:58 PM  Vitals shown include unvalidated device data.  Last Pain:  Vitals:   06/09/18 0825  TempSrc: Oral         Complications: No apparent anesthesia complications

## 2018-06-09 NOTE — Progress Notes (Signed)
Pharmacy Antibiotic Note  [place] Kappes Saunders is a 84 y.o. female admitted on 06/09/2018 s/p fall with open fracture.  Pt now s/p OR for repair.  Pt received Ancef 2gm IV x 1 pre-op.  To continue x 72 hours for empiric post-op coverage.    Plan: Ancef 1gm IV q24h x 3 dsoes - start 6/12 at 10am.  Height: 5\' 7"  (170.2 cm) Weight: 151 lb 7.3 oz (68.7 kg)(per last admission) IBW/kg (Calculated) : 61.6  Temp (24hrs), Avg:98.3 F (36.8 C), Min:97 F (36.1 C), Max:99.1 F (37.3 C)  Recent Labs  Lab 06/09/18 0844  WBC 12.6*  CREATININE 3.29*    Estimated Creatinine Clearance: 8.8 mL/min (A) (by C-G formula based on SCr of 3.29 mg/dL (H)).    Allergies  Allergen Reactions  . Capsaicin Other (See Comments)    Topical application BURNED the skin    Thank you for allowing pharmacy to be a part of this patient's care.  [employer], Pharm.D., BCPS Clinical Pharmacist Pager: [phone] Clinical phone for 06/09/2018 is [phone]. 06/09/2018 7:04 PM

## 2018-06-09 NOTE — ED Notes (Signed)
This RN drew blood bank sample prior to pt being transported to OR

## 2018-06-09 NOTE — Progress Notes (Signed)
Orthopedic Tech Progress Note Patient Details:  [name] 03-Nov-1915 [id number] Called bio-tech for night splint. Patient ID: [name], female   DOB: 05-19-1915, 84 y.o.   MRN: [id number]   [name] 06/09/2018, 2:09 PM

## 2018-06-09 NOTE — Op Note (Signed)
NAME: [name], [name] ACCOUNT [id number] DATE OF BIRTH:1915/10/13 FACILITY: [facility] LOCATION: [facility] PHYSICIAN:Taleia Sadowski H. Jordynne Mccown, MD  OPERATIVE REPORT  DATE OF PROCEDURE:  06/09/2018  PREOPERATIVE DIAGNOSIS:  Grade IIIB open right tibia and fibula fractures.  POSTOPERATIVE DIAGNOSIS:  Grade IIIB open right tibia and fibula fractures.  PROCEDURES: 1.  Irrigation and debridement of open right tibia fracture including removal of bone. 2.  Intramedullary nailing of the right tibia with a Biomet VersaNail, 9 x 330 mm statically locked. 3.  Complex partial closure right tibial traumatic wound and skin tears 8 cm. 4.  Infuse allografting. 5.  Application of ACell biologic graft . 6.  Application of small wound VAC. 7.  Placement of antibiotic beads.  SURGEON:  Altamese [place], MD  ASSISTANT:  Ainsley Spinner, PA-C   ANESTHESIA:  General.  COMPLICATIONS:  None.  ESTIMATED BLOOD LOSS:  100 mL.  SPECIMENS:  None.  TOURNIQUET:  None.  DISPOSITION:  To PACU.  CONDITION:  Stable.  BRIEF INDICATION FOR PROCEDURE:  The patient is a very pleasant 84 year old female who was in a nursing facility when she was found down with her walker with the bone protruding from the skin in the hallway.  She was taken to the emergency room where a  closed reduction attempt was performed.  The patient has been in a splint and underwent a thorough evaluation at the request of Dr. Melina Copa.  The patient had an extensive medical history.  We did discuss with her and her son and daughter-in-law the risks  of the procedure including failure to prevent infection, possibility of deep infection that may result in limb loss, DVT, PE, heart attack, stroke and multiple others, the risk for which were particularly elevated given her age of 7.  They did wish to  proceed.  DESCRIPTION OF PROCEDURE:  The patient was taken to the operating room after administration of preoperative antibiotics.  The  right lower extremity was prepped and draped in the usual sterile fashion.  We were very careful to protect the soft tissue  envelope during prep and drape.  There was a large stellate defect with an 8 x 5 area of soft tissue loss directly over the distal medial tibia at the metaphysis.  Because of the extensive skin wound, I did not need to extend the incision proximally or  distally to deliver the bone ends and perform a thorough debridement.  After a timeout, this was undertaken and a curettage and a scalpel used to remove contaminated bone from the intramedullary canal.  There were no significant large devascularized  cortical segments.  Thankfully, the nonviable skin edge and subcutaneous tissue and muscle fascia were debrided.  I retained as much tissue as absolutely possible.  Saline 9000 mL, 6000 of which were with pulsatile lavage while meticulously protecting  the soft tissue around the bone and then some supplementation with soap to facilitate removal of any contaminants.  I did not identify any significant gross contamination.  Fresh attire and drapes were then used, and a reduction maneuver performed under direct visualization.  This was held and maintained without any pressure on the soft tissues while a starting incision was made for medial parapatellar approach advancement  of the curved cannulated awl in the center-center position of the proximal tibia and then the ball-tipped guidewire across the fracture site into the center of the plafond.  Sequential reaming was performed starting with 8 and going to 10, placing a 9 mm  nail.  We then followed with placement of the nail and placing a most distal medial to lateral lock using perfect-circle technique.  Two locking screws were placed proximally off the guide.  The guide was then removed.  I was then able to place the more  proximal of the 2 distal locks because of the spiral tibial shaft.  So, consequently, I then brought the C-arm in and  made an incision for placement of an anterior to posterior screw.  I was careful to retract the anterior tibial tendon laterally and  then placed a screw, securing it in the far cortex.  Final imaging showed appropriate reduction, hardware placement, trajectory and length.  Ainsley Spinner, PA-C, assisted me out throughout this.  I held the reduction while he reamed and placed the nail.  He  also worked with me on the debridement as he delivered the bone ends for the debridement and also helped to protect the soft tissues.  We then placed antibiotic beads with vancomycin and tobramycin because of the patient's degloving injury and soft tissue instability.  We were unable to plan for a return trip to the OR and consequently chose to place the antibiotic beads in his IIIB  open fracture.  We then followed ____ beads with a very complex closure using a variety of PDS and then nylon sutures for a retention-type closure and corner stitches and an Allgower-Donati and similar means to advance but not place undue pressure on any  of these small skin flaps, some of which will surely not remain viable but are providing for biologic dressing and protection at this time.  The remaining defect as she was not a candidate for free flap placement was treated with additional ACell  biologic graft.  It should be noted that prior to initiating the complex closure, we did place Infuse allograft all around the bone and that fortunately periosteal stripping was not severe in some areas, and enabling for perhaps some bridging continuity.   I did advance the fat layer as well underneath the subcu closure.  We then used a Mepitel and placed a wound VAC set at 50 mmHg, using a slow setting because of the fragility of her soft tissues.  This did incorporate the distal locking bolt sites.  Sterile gently compressive dressing was applied and then a night splint  to hold the foot in the appropriate position, but continued to enable soft  tissue dressing changes and inspection.  Again, Ainsley Spinner, PA-C, assisted me throughout.  PROGNOSIS:  I have already sent the x-ray and clinical pictures to my plastic surgery colleagues, both here at [facility] and then at [facility] where free flaps are performed for the orthopedic trauma service.  Neither stated that she would be a candidate for other  treatment protocols, and both recommended that we proceed with the attempted partial closure and ACell, as well as the VAC, which we have done today.  This gives her the best chance as she has at least a 50% chance of requiring amputation, which has  been discussed with the family, perhaps more so given the magnitude of skin injury and her age and comorbidities with the diabetes.  LN/NUANCE  D:06/09/2018 T:06/09/2018 JOB:[id number]

## 2018-06-09 NOTE — ED Notes (Signed)
Ortho tech paged to Charge RN [place] per his request

## 2018-06-09 NOTE — Consult Note (Signed)
Reason for Consult:Open tibia fx Referring Physician: [name] is an 84 y.o. female.  HPI: [name] suffered an unwitnessed fall at the facility where she lives. She was found with her lower leg grossly deformed and her tibia sticking out. She was brought to the ED and orthopedic surgery was consulted. She was reduced in the ED and x-rays showed a tib/fib fx that did not involve the ankle. She is demented and fairly HoH.  Past Medical History:  Diagnosis Date  . Chronic kidney disease (CKD), stage III (moderate) ([place])    [name] 12/06/2015  . History of blood transfusion    "when my son was born; maybe once since" (04/30/2016)  . Hypertension   . Hypothyroidism   . Pneumonia 1930s; ? 11/2015  . Tuberculosis 1930s  . Type II diabetes mellitus ([place]) since 1964    Past Surgical History:  Procedure Laterality Date  . CATARACT EXTRACTION W/ INTRAOCULAR LENS  IMPLANT, BILATERAL Bilateral   . ESOPHAGOGASTRODUODENOSCOPY N/A 03/03/2015   Procedure: ESOPHAGOGASTRODUODENOSCOPY (EGD);  Surgeon: Inda Castle, MD;  Location: [facility];  Service: Endoscopy;  Laterality: N/A;  . ESOPHAGOGASTRODUODENOSCOPY N/A 03/07/2015   Procedure: ESOPHAGOGASTRODUODENOSCOPY (EGD);  Surgeon: Jerene Bears, MD;  Location: [facility] ENDOSCOPY;  Service: Endoscopy;  Laterality: N/A;  . FRACTURE SURGERY    . HIP FRACTURE SURGERY Bilateral   . TONSILLECTOMY      Family History  Problem Relation Age of Onset  . Diabetes Father     Social History:  reports that she has never smoked. She has never used smokeless tobacco. She reports that she drinks alcohol. She reports that she does not use drugs.  Allergies:  Allergies  Allergen Reactions  . Capsaicin Other (See Comments)    Topical application BURNED the skin    Medications: I have reviewed the patient's current medications.  Results for orders placed or performed during the hospital encounter of 06/09/18 (from the past 48 hour(s))  Basic metabolic  panel     Status: Abnormal   Collection Time: 06/09/18  8:44 AM  Result Value Ref Range   Sodium 137 135 - 145 mmol/L   Potassium 4.5 3.5 - 5.1 mmol/L   Chloride 103 101 - 111 mmol/L   CO2 24 22 - 32 mmol/L   Glucose, Bld 209 (H) 65 - 99 mg/dL   BUN 65 (H) 6 - 20 mg/dL   Creatinine, Ser 3.29 (H) 0.44 - 1.00 mg/dL   Calcium 10.2 8.9 - 10.3 mg/dL   GFR calc non Af Amer 11 (L) >60 mL/min   GFR calc Af Amer 12 (L) >60 mL/min    Comment: (NOTE) The eGFR has been calculated using the CKD EPI equation. This calculation has not been validated in all clinical situations. eGFR's persistently <60 mL/min signify possible Chronic Kidney Disease.    Anion gap 10 5 - 15    Comment: Performed at [facility] [address], [place], [place] [address]  CBC with Differential     Status: Abnormal   Collection Time: 06/09/18  8:44 AM  Result Value Ref Range   WBC 12.6 (H) 4.0 - 10.5 K/uL   RBC 3.37 (L) 3.87 - 5.11 MIL/uL   Hemoglobin 10.9 (L) 12.0 - 15.0 g/dL   HCT 33.7 (L) 36.0 - 46.0 %   MCV 100.0 78.0 - 100.0 fL   MCH 32.3 26.0 - 34.0 pg   MCHC 32.3 30.0 - 36.0 g/dL   RDW 12.8 11.5 -  15.5 %   Platelets 138 (L) 150 - 400 K/uL   Neutrophils Relative % 74 %   Neutro Abs 9.5 (H) 1.7 - 7.7 K/uL   Lymphocytes Relative 17 %   Lymphs Abs 2.1 0.7 - 4.0 K/uL   Monocytes Relative 7 %   Monocytes Absolute 0.8 0.1 - 1.0 K/uL   Eosinophils Relative 1 %   Eosinophils Absolute 0.1 0.0 - 0.7 K/uL   Basophils Relative 0 %   Basophils Absolute 0.0 0.0 - 0.1 K/uL   Immature Granulocytes 1 %   Abs Immature Granulocytes 0.1 0.0 - 0.1 K/uL    Comment: Performed at [facility] [address] [place], [place] [address]  Protime-INR     Status: None   Collection Time: 06/09/18  8:44 AM  Result Value Ref Range   Prothrombin Time 13.8 11.4 - 15.2 seconds   INR 1.07     Comment: Performed at [facility] [address], [place], [place] [address]    Dg Tibia/fibula Right  Result  Date: 06/09/2018 CLINICAL DATA:  Pain following fall EXAM: RIGHT TIBIA AND FIBULA - 2 VIEW COMPARISON:  None. FINDINGS: Frontal and lateral views were obtained. There are fractures of each distal tibia and fibula. The distal tibial fracture is located approximately 9 cm proximal to the tibial plafond. This fracture is obliquely oriented with lateral and anterior displacement as well as anterior angulation distally. There is a comminuted fracture of the distal fibula at this same level. There is lateral and anterior displacement as well as anterior angulation of the distal major fracture fragment with respect proximal fragment. In addition, there is a fracture of the proximal fibular metaphysis with slight impaction at the fracture site. No other fractures are evident. No dislocation. There is moderate osteoarthritic change in the knee and ankle regions. IMPRESSION: Displaced fractures of the distal tibia and fibula as described. Fracture of the proximal fibular metaphysis with mild impaction and near anatomic alignment. No dislocations. Osteoarthritic change in the knee and ankle regions noted. Electronically Signed   By: Lowella Grip III M.D.   On: 06/09/2018 09:33   Dg Ankle 2 Views Right  Result Date: 06/09/2018 CLINICAL DATA:  [place] EXAM: RIGHT ANKLE - 2 VIEW COMPARISON:  None. FINDINGS: Oblique fracture through the distal tibial shaft. Transverse fracture through the distal fibular shaft. There is nearly 90 degrees angulation, at least 90 degrees rotation, and greater than shaft width displacement of distal fracture fragments. There is soft tissue irregularity suggesting open fracture. Tibiotalar articulation appears preserved on this single projection. IMPRESSION: 1. Distal tibial and fibular shaft fractures with significant angulation, rotation, and displacement of fracture fragments. Electronically Signed   By: Lucrezia Europe M.D.   On: 06/09/2018 09:12   Dg Ankle Complete Right  Result Date:  06/09/2018 CLINICAL DATA:  Pain following fall EXAM: RIGHT ANKLE - COMPLETE 3+ VIEW COMPARISON:  Right tibia and fibula radiographs June 09, 2018 FINDINGS: Frontal, oblique, and lateral views were obtained. There are displaced fractures of the distal tibia and fibula located approximately 9 cm proximal to the tibial plafond. In the ankle region, there is no appreciable fracture or joint effusion. There is mild joint space narrowing laterally. There is an inferior calcaneal spur. No erosive change. IMPRESSION: Fractures of the distal tibia and fibula with lateral aunt and anterior displacement as well as anterior angulation of distal fracture fragments, described in the report of the right tibia and fibula. In the ankle region,  no fracture evident. Ankle mortise appears intact. Electronically Signed   By: Lowella Grip III M.D.   On: 06/09/2018 09:36   Dg Chest Port 1 View  Result Date: 06/09/2018 CLINICAL DATA:  Pain following fall EXAM: PORTABLE CHEST 1 VIEW COMPARISON:  Apr 30, 2016 FINDINGS: There is airspace consolidation in the right upper lobe consistent with a degree of pneumonia. The lungs elsewhere are clear. Heart is upper normal in size with pulmonary vascularity normal. No adenopathy. Bones are osteoporotic. No pneumothorax. No fracture. There is bilateral carotid artery calcification. IMPRESSION: Airspace consolidation right upper lobe consistent with pneumonia. Lungs elsewhere clear. Stable cardiac silhouette. Bones osteoporotic. No evident pneumothorax. There is calcification in each carotid artery. Electronically Signed   By: Lowella Grip III M.D.   On: 06/09/2018 09:34    Review of Systems  Unable to perform ROS: Dementia   Blood pressure (!) 150/81, pulse (!) 55, temperature 99.1 F (37.3 C), temperature source Oral, resp. rate (!) 22, SpO2 100 %. Physical Exam  Constitutional: She appears well-developed and well-nourished. No distress.  HENT:  Head: Normocephalic and  atraumatic.  Eyes: Conjunctivae are normal. Right eye exhibits no discharge. Left eye exhibits no discharge. No scleral icterus.  Neck: Normal range of motion.  Cardiovascular: Normal rate and regular rhythm.  Respiratory: Effort normal. No respiratory distress.  Musculoskeletal:  RLE Foot/ankle turned 180 degrees and dorsiflexed. Tibia protruded through medial leg. Intact DP/PT. Leg reduced to anatomic position, pulses remained intact, bone still protuberant.  No knee or ankle effusion  Knee stable to varus/ valgus and anterior/posterior stress  Sens DPN, SPN, TN could not assess  Motor EHL, ext, flex, evers could not assess  DP 2+, PT 2+, No significant edema  Neurological: She is alert.  Skin: Skin is warm and dry. She is not diaphoretic.  Psychiatric: She has a normal mood and affect. Her behavior is normal.    Assessment/Plan: Fall Open right tib/fib fx -- For I&D, IMN by Dr. Marcelino Scot this morning. Please keep NPO. S/p CVA DM COPD HTN Heart murmur w/heart block -----Admission by internal medicine, appreciate help  Spoke with son and daugher-in-law who understand the high risk of surgery but willing to proceed given emergency nature.    Lisette Abu, PA-C Orthopedic Surgery [phone] 06/09/2018, 9:44 AM

## 2018-06-09 NOTE — ED Triage Notes (Signed)
Pt in from [facility], per report pt was changed by staff at [facility], at 7:109 pt was found on the floor, pt has open distal tibia fracture with complete rotation with toes pointing posterior, pt does not take blood thinners, pt received 100 mcg Fentanyl pta, #18 L AC, pt calm & cooperative, pt baseline ambulates with a walker, pt A&O x4

## 2018-06-09 NOTE — Brief Op Note (Signed)
06/09/2018  5:33 PM  PATIENT:  [name]  84 y.o. female  PRE-OPERATIVE DIAGNOSIS:  GRADE 3B OPEN RIGHT TIBIA AND FIBULA FRACTURES  POST-OPERATIVE DIAGNOSIS:  GRADE 3B OPEN RIGHT TIBIA AND FIBULA FRACTURES  PROCEDURE:  Procedure(s): 1. IRRIGATION AND DEBRIDEMENT EXTREMITY (Right) 2. INTRAMEDULLARY (IM) NAIL TIBIAL (Right) WITH BIOMET VERSANAIL 9 X 330 MM STATICALLY LOCKED 3. COMPLEX CLOSURE 8 CM 4. INFUSE ALLOGRAFTING 5. APPLICATION OF ACELL BIOLOGIC GRAFT 6. PLACEMENT OF ANTIBIOTIC BEADS 7. APPLICATION OF SMALL WOUND VAC  SURGEON:  Surgeon(s) and Role:    * Altamese [place], MD - Primary  PHYSICIAN ASSISTANT: Ainsley Spinner, PA-C  ANESTHESIA:   general  EBL:  75 mL   BLOOD ADMINISTERED:none  DRAINS: none   LOCAL MEDICATIONS USED:  NONE  SPECIMEN:  No Specimen  DISPOSITION OF SPECIMEN:  N/A  COUNTS:  YES  TOURNIQUET:  * No tourniquets in log *  DICTATION: .Other Dictation: Dictation Number [id number]  PLAN OF CARE: Admit to inpatient   PATIENT DISPOSITION:  PACU - hemodynamically stable.   Delay start of Pharmacological VTE agent (>24hrs) due to surgical blood loss or risk of bleeding: no

## 2018-06-10 ENCOUNTER — Other Ambulatory Visit: Payer: Self-pay

## 2018-06-10 ENCOUNTER — Encounter (HOSPITAL_COMMUNITY): Payer: Self-pay | Admitting: General Practice

## 2018-06-10 DIAGNOSIS — S82201C Unspecified fracture of shaft of right tibia, initial encounter for open fracture type IIIA, IIIB, or IIIC: Secondary | ICD-10-CM

## 2018-06-10 DIAGNOSIS — I4891 Unspecified atrial fibrillation: Secondary | ICD-10-CM

## 2018-06-10 DIAGNOSIS — N390 Urinary tract infection, site not specified: Secondary | ICD-10-CM

## 2018-06-10 DIAGNOSIS — I10 Essential (primary) hypertension: Secondary | ICD-10-CM

## 2018-06-10 DIAGNOSIS — N183 Chronic kidney disease, stage 3 (moderate): Secondary | ICD-10-CM

## 2018-06-10 DIAGNOSIS — S82401C Unspecified fracture of shaft of right fibula, initial encounter for open fracture type IIIA, IIIB, or IIIC: Secondary | ICD-10-CM

## 2018-06-10 DIAGNOSIS — E038 Other specified hypothyroidism: Secondary | ICD-10-CM

## 2018-06-10 DIAGNOSIS — J189 Pneumonia, unspecified organism: Secondary | ICD-10-CM | POA: Diagnosis present

## 2018-06-10 LAB — CBC
HCT: 24.3 % — ABNORMAL LOW (ref 36.0–46.0)
Hemoglobin: 7.8 g/dL — ABNORMAL LOW (ref 12.0–15.0)
MCH: 32.6 pg (ref 26.0–34.0)
MCHC: 32.1 g/dL (ref 30.0–36.0)
MCV: 101.7 fL — ABNORMAL HIGH (ref 78.0–100.0)
Platelets: 120 10*3/uL — ABNORMAL LOW (ref 150–400)
RBC: 2.39 MIL/uL — ABNORMAL LOW (ref 3.87–5.11)
RDW: 12.9 % (ref 11.5–15.5)
WBC: 14.5 10*3/uL — ABNORMAL HIGH (ref 4.0–10.5)

## 2018-06-10 LAB — URINALYSIS, ROUTINE W REFLEX MICROSCOPIC
BILIRUBIN URINE: NEGATIVE
GLUCOSE, UA: 50 mg/dL — AB
KETONES UR: NEGATIVE mg/dL
Nitrite: NEGATIVE
PROTEIN: 30 mg/dL — AB
Specific Gravity, Urine: 1.012 (ref 1.005–1.030)
WBC, UA: >50 WBC/hpf — ABNORMAL HIGH (ref 0–5)
pH: 6 (ref 5.0–8.0)

## 2018-06-10 LAB — BASIC METABOLIC PANEL
Anion gap: 11 (ref 5–15)
BUN: 65 mg/dL — AB (ref 6–20)
CALCIUM: 9.6 mg/dL (ref 8.9–10.3)
CO2: 21 mmol/L — ABNORMAL LOW (ref 22–32)
Chloride: 105 mmol/L (ref 101–111)
Creatinine, Ser: 2.93 mg/dL — ABNORMAL HIGH (ref 0.44–1.00)
GFR calc Af Amer: 14 mL/min — ABNORMAL LOW (ref >60)
GFR, EST NON AFRICAN AMERICAN: 12 mL/min — AB (ref >60)
GLUCOSE: 56 mg/dL — AB (ref 65–99)
Potassium: 3.8 mmol/L (ref 3.5–5.1)
Sodium: 137 mmol/L (ref 135–145)

## 2018-06-10 LAB — GLUCOSE, CAPILLARY
GLUCOSE-CAPILLARY: 250 mg/dL — AB (ref 65–99)
GLUCOSE-CAPILLARY: 185 mg/dL — AB (ref 65–99)
Glucose-Capillary: 201 mg/dL — ABNORMAL HIGH (ref 65–99)
Glucose-Capillary: 68 mg/dL (ref 65–99)

## 2018-06-10 LAB — STREP PNEUMONIAE URINARY ANTIGEN: Strep Pneumo Urinary Antigen: NEGATIVE

## 2018-06-10 LAB — MRSA PCR SCREENING: MRSA by PCR: NEGATIVE

## 2018-06-10 LAB — MAGNESIUM: Magnesium: 1.8 mg/dL (ref 1.7–2.4)

## 2018-06-10 LAB — SODIUM, URINE, RANDOM: Sodium, Ur: 80 mmol/L

## 2018-06-10 LAB — CREATININE, URINE, RANDOM: Creatinine, Urine: 52.04 mg/dL

## 2018-06-10 MED ORDER — LEVOTHYROXINE SODIUM 100 MCG IV SOLR: 50.0000 ug | Freq: Every day | INTRAVENOUS | Status: DC

## 2018-06-10 MED ORDER — SODIUM CHLORIDE 0.9 % IV SOLN
INTRAVENOUS | Status: DC
  Administered 2018-06-11: 01:00:00 via INTRAVENOUS

## 2018-06-10 MED ORDER — SODIUM CHLORIDE 0.9 % IV SOLN
1.0000 g | INTRAVENOUS | Status: DC
  Administered 2018-06-10: 1 g via INTRAVENOUS
  Filled 2018-06-10 (×3): qty 1

## 2018-06-10 MED ORDER — LEVOTHYROXINE SODIUM 88 MCG PO TABS
88.0000 ug | ORAL_TABLET | Freq: Every day | ORAL | Status: DC
  Administered 2018-06-11 – 2018-06-17 (×7): 88 ug via ORAL
  Filled 2018-06-10 (×7): qty 1

## 2018-06-10 MED ORDER — METHOCARBAMOL 500 MG PO TABS: 500.0000 mg | ORAL_TABLET | Freq: Four times a day (QID) | ORAL | Status: DC | PRN

## 2018-06-10 MED ORDER — VANCOMYCIN HCL IN DEXTROSE 1-5 GM/200ML-% IV SOLN: 1000.0000 mg | INTRAVENOUS | Status: DC

## 2018-06-10 NOTE — Progress Notes (Signed)
Patient is confused, kicking and refused  for CBG  to be taken attempted x3.

## 2018-06-10 NOTE — Progress Notes (Signed)
PROGRESS NOTE    [name]  TOI:[id number] DOB: [birth year] DOA: 06/09/2018 PCP: Leanna Battles, MD   Brief Narrative:  [name] is a 84 y.o. female with medical history significant of DM; hypothyroidism; HTN; and CKD stage 3 presenting with a fall at SNF.  She thinks she got up and had a mechanical fall.  Her leg was clearly broken with an open and very dislocated fracture.    PDX was consulted and patient underwent surgery 06/09/2018.  Urinalysis done concerning for UTI.  Chest x-ray done concerning for pneumonia.     Assessment & Plan:   Principal Problem:   Tibia and fibula open fracture, right Active Problems:   Hypothyroidism   Chronic kidney disease, stage III (moderate) (HCC)   Controlled diabetes mellitus with diabetic neuropathy, with long-term current use of insulin (HCC)   Unspecified atrial fibrillation (HCC)   Essential hypertension   HCAP (healthcare-associated pneumonia)   Acute lower UTI  #1 grade 3B open right tibia-fibula fracture Secondary to mechanical fall.  Patient status post I&D of open right tibia fracture including removal of bone, IM nailing of right tibia, complex partial closure right tibial traumatic wound and skin tears, infuse allografting, placement of small wound VAC and antibiotic beads per orthopedics.  PT/OT.  Per orthopedics.  2.  HCAP Noted on chest x-ray admission.  Check a sputum Gram stain and culture.  Blood cultures ordered and are pending.  Check a urine Legionella antigen.  Check a urine pneumococcus antigen.  Check a MRSA PCR.  Continue IV cefepime and IV vancomycin.  If PCR is negative will discontinue vancomycin.  3.  UTI Check urine cultures.  Continue IV cefepime.  4.  Hypothyroidism TSH elevated at 7.129.  Free T4 within normal limits.  Continue home dose Synthroid.  Repeat thyroid function studies in about 4 to 6 weeks.  Follow.  5.  Atrial fibrillation Noted on EKG on presentation.  Patient with no prior  history of A. fib.  Patient currently rate controlled.  Continue beta-blocker.  Patient would be a poor candidate for anticoagulation secondary to age, fall risks.  Will recommend aspirin postop and when okay with orthopedics.  6.  Acute on chronic kidney disease stage III Gentle hydration.  Hold ARB.  Follow renal function.  If worsening we will check a renal ultrasound.  Follow for now.  7.  Hypertension Stable.  Continue current regimen of Norvasc, Lopressor.  8.  Hyperlipidemia Continue statin.  9.  Diabetes mellitus II Hemoglobin A1c 7.5.  CBGs 250 this morning.  Continue to hold oral hypoglycemic agents.  Continue Lantus and sliding scale insulin for now.    DVT prophylaxis: SCDs/postop per orthopedics Code Status: DNR Family Communication: Updated patient.  No family at bedside. Disposition Plan: Skilled nursing facility.   Consultants:   Orthopedics: Dr. Marcelino Scot 06/09/2018  Procedures:   CT head CT C-spine 06/09/2018  Films of the right ankle 06/09/2018, chest x-ray 06/09/2018  Plain films of the right tib-fib 06/09/2018  I and D of open right tibia fracture including removal of bone/IM nailing of right tibia/complex partial closure right tibial traumatic wound and skin tears 8 cm/infuse allografting/application of small wound VAC/placement of antibiotic beads per Dr. Marcelino Scot 06/09/2018  Antimicrobials:   IV Cefepime 06/09/2018  IV vancomycin 06/09/2018   Subjective: Denies any chest pain.  No shortness of breath.  No nausea or vomiting.  Objective: Vitals:   06/09/18 2140 06/09/18 2357 06/10/18 0427 06/10/18 2100  BP: (!) 129/52 Marland Kitchen)  124/52 (!) 140/53 (!) 135/53  Pulse: 66 (!) 58 (!) 55 62  Resp: 18     Temp: 97.6 F (36.4 C) 98.5 F (36.9 C) (!) 97.4 F (36.3 C) 98.1 F (36.7 C)  TempSrc: Axillary Oral Oral Oral  SpO2:  99% 96% 96%  Weight:      Height:        Intake/Output Summary (Last 24 hours) at 06/10/2018 2106 Last data filed at 06/10/2018 1524 Gross  per 24 hour  Intake 1589.34 ml  Output 955 ml  Net 634.34 ml   Filed Weights   06/09/18 0941  Weight: 68.7 kg (151 lb 7.3 oz)    Examination:  General exam: Appears calm and comfortable  Respiratory system: Clear to auscultation. Respiratory effort normal. Cardiovascular system: S1 & S2 heard, RRR. No JVD, murmurs, rubs, gallops or clicks. No pedal edema. Gastrointestinal system: Abdomen is nondistended, soft and nontender. No organomegaly or masses felt. Normal bowel sounds heard. Central nervous system: Alert and oriented. No focal neurological deficits. Extremities: Symmetric 5 x 5 power. Skin: No rashes, lesions or ulcers Psychiatry: Judgement and insight appear normal. Mood & affect appropriate.     Data Reviewed: I have personally reviewed following labs and imaging studies  CBC: Recent Labs  Lab 06/09/18 0844 06/10/18 1620  WBC 12.6* 14.5*  NEUTROABS 9.5*  --   HGB 10.9* 7.8*  HCT 33.7* 24.3*  MCV 100.0 101.7*  PLT 138* 628*   Basic Metabolic Panel: Recent Labs  Lab 06/09/18 0844 06/10/18 1620  NA 137 137  K 4.5 3.8  CL 103 105  CO2 24 21*  GLUCOSE 209* 56*  BUN 65* 65*  CREATININE 3.29* 2.93*  CALCIUM 10.2 9.6  MG  --  1.8   GFR: Estimated Creatinine Clearance: 9.9 mL/min (A) (by C-G formula based on SCr of 2.93 mg/dL (H)). Liver Function Tests: No results for input(s): AST, ALT, ALKPHOS, BILITOT, PROT, ALBUMIN in the last 168 hours. No results for input(s): LIPASE, AMYLASE in the last 168 hours. No results for input(s): AMMONIA in the last 168 hours. Coagulation Profile: Recent Labs  Lab 06/09/18 0844  INR 1.07   Cardiac Enzymes: No results for input(s): CKTOTAL, CKMB, CKMBINDEX, TROPONINI in the last 168 hours. BNP (last 3 results) No results for input(s): PROBNP in the last 8760 hours. HbA1C: Recent Labs    06/09/18 1855  HGBA1C 7.5*   CBG: Recent Labs  Lab 06/09/18 2239 06/10/18 0904 06/10/18 1146 06/10/18 1642 06/10/18 2103   GLUCAP 223* 250* 201* 68 185*   Lipid Profile: No results for input(s): CHOL, HDL, LDLCALC, TRIG, CHOLHDL, LDLDIRECT in the last 72 hours. Thyroid Function Tests: Recent Labs    06/09/18 2050  TSH 7.129*  FREET4 1.09   Anemia Panel: No results for input(s): VITAMINB12, FOLATE, FERRITIN, TIBC, IRON, RETICCTPCT in the last 72 hours. Sepsis Labs: No results for input(s): PROCALCITON, LATICACIDVEN in the last 168 hours.  Recent Results (from the past 240 hour(s))  Culture, blood (routine x 2)     Status: None (Preliminary result)   Collection Time: 06/09/18 10:05 AM  Result Value Ref Range Status   Specimen Description BLOOD RIGHT FOREARM  Final   Special Requests   Final    BOTTLES DRAWN AEROBIC AND ANAEROBIC Blood Culture adequate volume   Culture   Final    NO GROWTH 1 DAY Performed at [facility], [address] [place], [place] [address]    Report Status PENDING  Incomplete  MRSA PCR Screening     Status: None   Collection Time: 06/10/18 11:04 AM  Result Value Ref Range Status   MRSA by PCR NEGATIVE NEGATIVE Final    Comment:        The GeneXpert MRSA Assay (FDA approved for NASAL specimens only), is one component of a comprehensive MRSA colonization surveillance program. It is not intended to diagnose MRSA infection nor to guide or monitor treatment for MRSA infections. Performed at [facility], [place] [address], [place], [place] [address]          Radiology Studies: Dg Tibia/fibula Right  Result Date: 06/09/2018 CLINICAL DATA:  Lower leg fractures.  Tibial intramedullary nail. EXAM: RIGHT TIBIA AND FIBULA - 2 VIEW; DG C-ARM 61-120 MIN FLUOROSCOPY TIME:  1 minutes and 4 seconds C-arm fluoroscopic images were obtained intraoperatively and submitted for post operative interpretation. Please see the performing provider's procedural report for the fluoroscopy time utilized. COMPARISON:  Radiographs earlier the same date. FINDINGS: Six spot  fluoroscopic images are submitted. These demonstrate the placement of a tibial intramedullary nail, secured by 2 proximal and 2 distal interlocking screws. There is near anatomic reduction of the oblique fracture of the distal tibial diaphysis. The alignment of the fibular fracture is also significantly improved. No perioperative complications. IMPRESSION: Improved alignment of the tibial and fibular fractures post tibial intramedullary nail fixation. No demonstrated complication. Electronically Signed   By: Richardean Sale M.D.   On: 06/09/2018 13:49   Dg Tibia/fibula Right  Result Date: 06/09/2018 CLINICAL DATA:  Pain following fall EXAM: RIGHT TIBIA AND FIBULA - 2 VIEW COMPARISON:  None. FINDINGS: Frontal and lateral views were obtained. There are fractures of each distal tibia and fibula. The distal tibial fracture is located approximately 9 cm proximal to the tibial plafond. This fracture is obliquely oriented with lateral and anterior displacement as well as anterior angulation distally. There is a comminuted fracture of the distal fibula at this same level. There is lateral and anterior displacement as well as anterior angulation of the distal major fracture fragment with respect proximal fragment. In addition, there is a fracture of the proximal fibular metaphysis with slight impaction at the fracture site. No other fractures are evident. No dislocation. There is moderate osteoarthritic change in the knee and ankle regions. IMPRESSION: Displaced fractures of the distal tibia and fibula as described. Fracture of the proximal fibular metaphysis with mild impaction and near anatomic alignment. No dislocations. Osteoarthritic change in the knee and ankle regions noted. Electronically Signed   By: Lowella Grip III M.D.   On: 06/09/2018 09:33   Dg Ankle 2 Views Right  Result Date: 06/09/2018 CLINICAL DATA:  [place] EXAM: RIGHT ANKLE - 2 VIEW COMPARISON:  None. FINDINGS: Oblique fracture through the  distal tibial shaft. Transverse fracture through the distal fibular shaft. There is nearly 90 degrees angulation, at least 90 degrees rotation, and greater than shaft width displacement of distal fracture fragments. There is soft tissue irregularity suggesting open fracture. Tibiotalar articulation appears preserved on this single projection. IMPRESSION: 1. Distal tibial and fibular shaft fractures with significant angulation, rotation, and displacement of fracture fragments. Electronically Signed   By: Lucrezia Europe M.D.   On: 06/09/2018 09:12   Dg Ankle Complete Right  Result Date: 06/09/2018 CLINICAL DATA:  Pain following fall EXAM: RIGHT ANKLE - COMPLETE 3+ VIEW COMPARISON:  Right tibia and fibula radiographs June 09, 2018 FINDINGS: Frontal, oblique, and lateral views were obtained. There are displaced fractures of  the distal tibia and fibula located approximately 9 cm proximal to the tibial plafond. In the ankle region, there is no appreciable fracture or joint effusion. There is mild joint space narrowing laterally. There is an inferior calcaneal spur. No erosive change. IMPRESSION: Fractures of the distal tibia and fibula with lateral aunt and anterior displacement as well as anterior angulation of distal fracture fragments, described in the report of the right tibia and fibula. In the ankle region, no fracture evident. Ankle mortise appears intact. Electronically Signed   By: Lowella Grip III M.D.   On: 06/09/2018 09:36   Ct Head Wo Contrast  Result Date: 06/09/2018 CLINICAL DATA:  Unwitnessed fall.  History of underlying dementia EXAM: CT HEAD WITHOUT CONTRAST CT CERVICAL SPINE WITHOUT CONTRAST TECHNIQUE: Multidetector CT imaging of the head and cervical spine was performed following the standard protocol without intravenous contrast. Multiplanar CT image reconstructions of the cervical spine were also generated. COMPARISON:  Head CT May 24, 2016 FINDINGS: CT HEAD FINDINGS Brain: There is moderate  diffuse atrophy. There is no intracranial mass, hemorrhage, extra-axial fluid collection, or midline shift. There is evidence of a prior infarct in the left frontal lobe superiorly, not present on prior study from 2017. There is evidence of a prior infarct in the mid right cerebellum, stable. There is patchy small vessel disease in the centra semiovale bilaterally, stable. There is evidence of a prior small infarct in the mid right external capsule region, stable. There is a focal small infarct at the junction of the right lentiform nucleus and inferior centrum semiovale on the right, stable. There is a prior appearing small infarct in the mid left thalamus. There is no acute appearing infarct evident on this study. Vascular: No appreciable hyperdense vessel. There is calcification in the left distal vertebral artery as well as in both carotid siphon regions. Skull: Bony calvarium appears intact. There is a right parietal scalp hematoma. There is arthropathy in each temporomandibular joint. Sinuses/Orbits: There is mucosal thickening in several ethmoid air cells. Other paranasal sinuses are clear. Orbits appear symmetric bilaterally. Other: Mastoids on the right are clear. There is opacification of several inferior mastoid air cells on the left, stable from prior study. CT CERVICAL SPINE FINDINGS Alignment: There is 2 mm of retrolisthesis of C4 on C5. There is no other appreciable spondylolisthesis. Skull base and vertebrae: Skull base and craniocervical junction regions appear normal. There is moderate pannus posterior to the odontoid, not causing appreciable impression on the underlying craniocervical junction. No fracture is appreciable. There are no blastic or lytic bone lesions. Soft tissues and spinal canal: Prevertebral soft tissues and predental space regions are normal. There is bony hypertrophy posteriorly at several levels, not causing high-grade stenosis. No cord canal hematoma evident. No paraspinous  lesions. Disc levels: There is moderate disc space narrowing at C3-4. There is mild disc space narrowing at C4-5. Other disc spaces appear unremarkable. There is multilevel facet hypertrophy. No disc extrusion or stenosis. Upper chest: There is pleural effusion on the left, free-flowing. There is scarring in each lung apex. Other: There is calcification in each subclavian artery as well as in each carotid artery. IMPRESSION: CT head: 1. Atrophy with scattered prior infarcts and small vessel disease. No acute infarct evident. No mass or hemorrhage. 2.   Foci of arterial vascular calcification noted. 3. Parietal scalp hematoma noted on the right. No bony abnormality. 4. Mucosal thickening noted in several ethmoid air cells. Chronic inferior left mastoid air cell disease. 5.  Arthropathy noted in each temporomandibular joint. CT cervical spine: 1. No demonstrable fracture. Mild spondylolisthesis at C4-5 is felt to be due to underlying spondylosis. 2. There is osteoarthritic change at several levels. No frank disc extrusion or stenosis. 3.  Free-flowing pleural effusion on the left. 4.  Calcification in both subclavian and carotid arteries. Electronically Signed   By: Lowella Grip III M.D.   On: 06/09/2018 10:19   Ct Cervical Spine Wo Contrast  Result Date: 06/09/2018 CLINICAL DATA:  Unwitnessed fall.  History of underlying dementia EXAM: CT HEAD WITHOUT CONTRAST CT CERVICAL SPINE WITHOUT CONTRAST TECHNIQUE: Multidetector CT imaging of the head and cervical spine was performed following the standard protocol without intravenous contrast. Multiplanar CT image reconstructions of the cervical spine were also generated. COMPARISON:  Head CT May 24, 2016 FINDINGS: CT HEAD FINDINGS Brain: There is moderate diffuse atrophy. There is no intracranial mass, hemorrhage, extra-axial fluid collection, or midline shift. There is evidence of a prior infarct in the left frontal lobe superiorly, not present on prior study from  2017. There is evidence of a prior infarct in the mid right cerebellum, stable. There is patchy small vessel disease in the centra semiovale bilaterally, stable. There is evidence of a prior small infarct in the mid right external capsule region, stable. There is a focal small infarct at the junction of the right lentiform nucleus and inferior centrum semiovale on the right, stable. There is a prior appearing small infarct in the mid left thalamus. There is no acute appearing infarct evident on this study. Vascular: No appreciable hyperdense vessel. There is calcification in the left distal vertebral artery as well as in both carotid siphon regions. Skull: Bony calvarium appears intact. There is a right parietal scalp hematoma. There is arthropathy in each temporomandibular joint. Sinuses/Orbits: There is mucosal thickening in several ethmoid air cells. Other paranasal sinuses are clear. Orbits appear symmetric bilaterally. Other: Mastoids on the right are clear. There is opacification of several inferior mastoid air cells on the left, stable from prior study. CT CERVICAL SPINE FINDINGS Alignment: There is 2 mm of retrolisthesis of C4 on C5. There is no other appreciable spondylolisthesis. Skull base and vertebrae: Skull base and craniocervical junction regions appear normal. There is moderate pannus posterior to the odontoid, not causing appreciable impression on the underlying craniocervical junction. No fracture is appreciable. There are no blastic or lytic bone lesions. Soft tissues and spinal canal: Prevertebral soft tissues and predental space regions are normal. There is bony hypertrophy posteriorly at several levels, not causing high-grade stenosis. No cord canal hematoma evident. No paraspinous lesions. Disc levels: There is moderate disc space narrowing at C3-4. There is mild disc space narrowing at C4-5. Other disc spaces appear unremarkable. There is multilevel facet hypertrophy. No disc extrusion or  stenosis. Upper chest: There is pleural effusion on the left, free-flowing. There is scarring in each lung apex. Other: There is calcification in each subclavian artery as well as in each carotid artery. IMPRESSION: CT head: 1. Atrophy with scattered prior infarcts and small vessel disease. No acute infarct evident. No mass or hemorrhage. 2.   Foci of arterial vascular calcification noted. 3. Parietal scalp hematoma noted on the right. No bony abnormality. 4. Mucosal thickening noted in several ethmoid air cells. Chronic inferior left mastoid air cell disease. 5.  Arthropathy noted in each temporomandibular joint. CT cervical spine: 1. No demonstrable fracture. Mild spondylolisthesis at C4-5 is felt to be due to underlying spondylosis. 2. There is osteoarthritic  change at several levels. No frank disc extrusion or stenosis. 3.  Free-flowing pleural effusion on the left. 4.  Calcification in both subclavian and carotid arteries. Electronically Signed   By: Lowella Grip III M.D.   On: 06/09/2018 10:19   Dg Chest Port 1 View  Addendum Date: 06/09/2018   ADDENDUM REPORT: 06/09/2018 10:21 ADDENDUM: There is a left pleural effusion. Electronically Signed   By: Lowella Grip III M.D.   On: 06/09/2018 10:21   Result Date: 06/09/2018 CLINICAL DATA:  Pain following fall EXAM: PORTABLE CHEST 1 VIEW COMPARISON:  Apr 30, 2016 FINDINGS: There is airspace consolidation in the right upper lobe consistent with a degree of pneumonia. The lungs elsewhere are clear. Heart is upper normal in size with pulmonary vascularity normal. No adenopathy. Bones are osteoporotic. No pneumothorax. No fracture. There is bilateral carotid artery calcification. IMPRESSION: Airspace consolidation right upper lobe consistent with pneumonia. Lungs elsewhere clear. Stable cardiac silhouette. Bones osteoporotic. No evident pneumothorax. There is calcification in each carotid artery. Electronically Signed: By: Lowella Grip III M.D. On:  06/09/2018 09:34   Dg Tibia/fibula Right Port  Result Date: 06/09/2018 CLINICAL DATA:  Open tibial fracture fixation EXAM: PORTABLE RIGHT TIBIA AND FIBULA - 2 VIEW COMPARISON:  Intraoperative study from 06/09/2018 FINDINGS: Intramedullary nail fixation of the right tibia is noted without postoperative complicating features noted. A wound VAC is seen along the medial aspect of the distal right leg with antibiotic impregnated beads projecting over the distal third of the tibial diaphysis anteriorly. Fracture of the fibular neck and junction of the middle and distal third are noted as before. IMPRESSION: 1. IM nail fixation of open tibial fracture with wound vac device along the medial aspect of the distal right leg noted and antibiotic impregnated beads projecting over the anterior aspect of the distal right leg. Alignment is near anatomic. 2. Fibular neck and distal fibular diaphyseal fractures without significant displacement as before. Electronically Signed   By: Ashley Royalty M.D.   On: 06/09/2018 20:00   Dg C-arm 1-60 Min  Result Date: 06/09/2018 CLINICAL DATA:  Lower leg fractures.  Tibial intramedullary nail. EXAM: RIGHT TIBIA AND FIBULA - 2 VIEW; DG C-ARM 61-120 MIN FLUOROSCOPY TIME:  1 minutes and 4 seconds C-arm fluoroscopic images were obtained intraoperatively and submitted for post operative interpretation. Please see the performing provider's procedural report for the fluoroscopy time utilized. COMPARISON:  Radiographs earlier the same date. FINDINGS: Six spot fluoroscopic images are submitted. These demonstrate the placement of a tibial intramedullary nail, secured by 2 proximal and 2 distal interlocking screws. There is near anatomic reduction of the oblique fracture of the distal tibial diaphysis. The alignment of the fibular fracture is also significantly improved. No perioperative complications. IMPRESSION: Improved alignment of the tibial and fibular fractures post tibial intramedullary nail  fixation. No demonstrated complication. Electronically Signed   By: Richardean Sale M.D.   On: 06/09/2018 13:49        Scheduled Meds: . acetaminophen  500 mg Oral Q12H  . amLODipine  2.5 mg Oral Daily  . aspirin EC  325 mg Oral Daily  . chlorhexidine  60 mL Topical Once  . docusate sodium  100 mg Oral BID  . enoxaparin (LOVENOX) injection  30 mg Subcutaneous Q24H  . ferrous sulfate  325 mg Oral Q breakfast  . insulin aspart  0-15 Units Subcutaneous TID WC  . insulin glargine  7 Units Subcutaneous Daily  . ipratropium  2 spray Each Nare TID  AC  . [START ON 06/11/2018] levothyroxine  88 mcg Oral QAC breakfast  . metoprolol tartrate  25 mg Oral BID  . montelukast  10 mg Oral Daily  . pantoprazole  40 mg Oral Daily  . polyethylene glycol  17 g Oral Daily  . povidone-iodine  2 application Topical Once  . pravastatin  20 mg Oral Daily  . pregabalin  100 mg Oral Daily  . senna-docusate  1 tablet Oral Daily   Continuous Infusions: . sodium chloride Stopped (06/10/18 1524)  . sodium chloride 75 mL/hr at 06/10/18 0954  . ceFEPime (MAXIPIME) IV Stopped (06/10/18 1332)  . [START ON 06/11/2018] vancomycin       LOS: 1 day    Time spent: 35 minutes    Irine Seal, MD [facility] Pager [phone]  If 7PM-7AM, please contact night-coverage [email or web address] Password [facility] 06/10/2018, 9:06 PM

## 2018-06-10 NOTE — Social Work (Addendum)
CSW spoke with son, Sarin [place], [phone] and confirmed that his mom is listed as a DNR. Son indicated that he is patient's POA. He indicated that mom has specific request regarding her DNR status. CSW encouraged son to bring paperwork in to the floor and he can provide it to the admin/nursing staff and they can ensure it gets in patient's medical records.  Son is in agreement.  Elissa Hefty, LCSW Clinical Social Worker [phone]

## 2018-06-10 NOTE — Evaluation (Signed)
Physical Therapy Evaluation Patient Details Name: [name] MRN: [id number] DOB: Jan 30, 1915 Today's Date: 06/10/2018   History of Present Illness  Pt is a 84 y.o. female s/p R tibial IM nail with I&D. PMHx: CKD, HTN, Hypothyroidism, DM2.  Clinical Impression  Pt presented supine in bed with HOB elevated, very confused and somewhat agitated throughout. No family or caregivers present to provide any reliable information. Pt currently requires total A x2 for bed mobility. Pt refusing to participate in any further mobility at this time, agitated and confused. Pt would continue to benefit from skilled physical therapy services at this time while admitted and after d/c to address the below listed limitations in order to improve overall safety and independence with functional mobility.     Follow Up Recommendations SNF    Equipment Recommendations  None recommended by PT    Recommendations for Other Services       Precautions / Restrictions Precautions Precautions: Fall Restrictions Weight Bearing Restrictions: Yes RLE Weight Bearing: Non weight bearing      Mobility  Bed Mobility Overal bed mobility: Needs Assistance             General bed mobility comments: Total assist +2 for scooting up in bed to reposition.  Transfers                 General transfer comment: Pt refusing attempts at transfers this session.  Ambulation/Gait                Stairs            Wheelchair Mobility    Modified Rankin (Stroke Patients Only)       Balance Overall balance assessment: History of Falls                                           Pertinent Vitals/Pain Pain Assessment: Faces Faces Pain Scale: Hurts even more Pain Location: R leg Pain Descriptors / Indicators: Grimacing;Sore Pain Intervention(s): Monitored during session;Repositioned    Home Living Family/patient expects to be discharged to:: Skilled nursing facility                      Prior Function Level of Independence: Needs assistance         Comments: Unsure of PLOF; pt is a poor historian and unable to provide.     Hand Dominance        Extremity/Trunk Assessment   Upper Extremity Assessment Upper Extremity Assessment: Defer to OT evaluation    Lower Extremity Assessment Lower Extremity Assessment: Generalized weakness;RLE deficits/detail RLE Deficits / Details: Pt with R LE splint in place and ACE wrap donned. pt agitated and not allowing therapist to move her R LE and not actively moving during evaluation RLE: Unable to fully assess due to pain       Communication   Communication: HOH  Cognition Arousal/Alertness: Awake/alert Behavior During Therapy: Agitated Overall Cognitive Status: No family/caregiver present to determine baseline cognitive functioning                                        General Comments      Exercises     Assessment/Plan    PT Assessment Patient needs continued PT services  PT Problem List  Decreased strength;Decreased range of motion;Decreased activity tolerance;Decreased balance;Decreased mobility;Decreased coordination;Decreased cognition;Decreased knowledge of use of DME;Decreased safety awareness;Decreased knowledge of precautions;Pain       PT Treatment Interventions DME instruction;Gait training;Stair training;Functional mobility training;Therapeutic activities;Therapeutic exercise;Balance training;Neuromuscular re-education;Cognitive remediation;Patient/family education    PT Goals (Current goals can be found in the Care Plan section)  Acute Rehab PT Goals Patient Stated Goal: none stated PT Goal Formulation: Patient unable to participate in goal setting Time For Goal Achievement: 06/24/18 Potential to Achieve Goals: Fair    Frequency Min 3X/week   Barriers to discharge        Co-evaluation PT/OT/SLP Co-Evaluation/Treatment: Yes Reason for Co-Treatment:  Complexity of the patient's impairments (multi-system involvement);For patient/therapist safety;To address functional/ADL transfers PT goals addressed during session: Mobility/safety with mobility;Balance;Strengthening/ROM;Proper use of DME OT goals addressed during session: Strengthening/ROM       AM-PAC PT "6 Clicks" Daily Activity  Outcome Measure Difficulty turning over in bed (including adjusting bedclothes, sheets and blankets)?: Unable Difficulty moving from lying on back to sitting on the side of the bed? : Unable Difficulty sitting down on and standing up from a chair with arms (e.g., wheelchair, bedside commode, etc,.)?: Unable Help needed moving to and from a bed to chair (including a wheelchair)?: Total Help needed walking in hospital room?: Total Help needed climbing 3-5 steps with a railing? : Total 6 Click Score: 6    End of Session   Activity Tolerance: Patient limited by pain;Treatment limited secondary to agitation Patient left: in bed;with call bell/phone within reach;with bed alarm set;with SCD's reapplied Nurse Communication: Mobility status;Need for lift equipment PT Visit Diagnosis: Other abnormalities of gait and mobility (R26.89);Pain Pain - Right/Left: Right Pain - part of body: Leg    Time: 1357-1413 PT Time Calculation (min) (ACUTE ONLY): 16 min   Charges:   PT Evaluation $PT Eval Moderate Complexity: 1 Mod     PT G Codes:        [place], PT, DPT [place] 06/10/2018, 4:42 PM

## 2018-06-10 NOTE — Evaluation (Signed)
Occupational Therapy Evaluation Patient Details Name: [name] MRN: [id number] DOB: [birth year] Today's Date: 06/10/2018    History of Present Illness Pt is a 84 y.o. female s/p R tibial IM nail with I&D. PMHx: CKD, HTN, Hypothyroidism, DM2.   Clinical Impression   Unsure of PLOF; pt is a poor historian and unable to provide. Currently pt is total assist for ADL and total assist +2 for bed mobility. Pt refusing participation in bed mobility or ADL this session and required significant encouragement for repositioning in bed. Recommend SNF for follow up to maximize independence and safety with ADL and functional mobility. All further OT needs can be met at the next venue of care; signing off at this time. Please re-consult if needed. Thank you for this referral.    Follow Up Recommendations  SNF;Supervision/Assistance - 24 hour    Equipment Recommendations  None recommended by OT    Recommendations for Other Services       Precautions / Restrictions Precautions Precautions: Fall Restrictions Weight Bearing Restrictions: Yes RLE Weight Bearing: Non weight bearing      Mobility Bed Mobility Overal bed mobility: Needs Assistance             General bed mobility comments: Total assist +2 for scooting up in bed to reposition.  Transfers                 General transfer comment: Pt refusing attempts at transfers this session.    Balance Overall balance assessment: History of Falls                                         ADL either performed or assessed with clinical judgement   ADL Overall ADL's : Needs assistance/impaired                                       General ADL Comments: Pt currently total assist for ADL and total assist +2 for bed mobility.     Vision         Perception     Praxis      Pertinent Vitals/Pain Pain Assessment: Faces Faces Pain Scale: Hurts even more Pain Location: R leg Pain  Descriptors / Indicators: Grimacing;Sore Pain Intervention(s): Monitored during session;Limited activity within patient's tolerance;Repositioned     Hand Dominance     Extremity/Trunk Assessment Upper Extremity Assessment Upper Extremity Assessment: Generalized weakness   Lower Extremity Assessment Lower Extremity Assessment: Defer to PT evaluation       Communication Communication Communication: HOH   Cognition Arousal/Alertness: Awake/alert Behavior During Therapy: Agitated Overall Cognitive Status: No family/caregiver present to determine baseline cognitive functioning                                     General Comments       Exercises     Shoulder Instructions      Home Living Family/patient expects to be discharged to:: Skilled nursing facility                                        Prior Functioning/Environment Level of Independence: Needs assistance  Comments: Unsure of PLOF; pt is a poor historian and unable to provide.        OT Problem List:        OT Treatment/Interventions:      OT Goals(Current goals can be found in the care plan section) Acute Rehab OT Goals Patient Stated Goal: none stated OT Goal Formulation: All assessment and education complete, DC therapy  OT Frequency:     Barriers to D/C:            Co-evaluation PT/OT/SLP Co-Evaluation/Treatment: Yes Reason for Co-Treatment: Complexity of the patient's impairments (multi-system involvement);Necessary to address cognition/behavior during functional activity;For patient/therapist safety   OT goals addressed during session: Strengthening/ROM      AM-PAC PT "6 Clicks" Daily Activity     Outcome Measure Help from another person eating meals?: Total Help from another person taking care of personal grooming?: Total Help from another person toileting, which includes using toliet, bedpan, or urinal?: Total Help from another person bathing  (including washing, rinsing, drying)?: Total Help from another person to put on and taking off regular upper body clothing?: Total Help from another person to put on and taking off regular lower body clothing?: Total 6 Click Score: 6   End of Session    Activity Tolerance: Patient limited by pain Patient left: in bed;with call bell/phone within reach;with bed alarm set;with SCD's reapplied  OT Visit Diagnosis: Unsteadiness on feet (R26.81);Other abnormalities of gait and mobility (R26.89);History of falling (Z91.81);Pain Pain - Right/Left: Right Pain - part of body: Arm                Time: 0932-3557 OT Time Calculation (min): 16 min Charges:  OT General Charges $OT Visit: 1 Visit OT Evaluation $OT Eval Moderate Complexity: 1 Mod G-Codes:     Kabella Cassidy A. Ulice Brilliant, M.S., OTR/L Acute Rehab Department: [phone]  [name] 06/10/2018, 3:01 PM

## 2018-06-10 NOTE — Social Work (Signed)
CSW spoke to [place] at [facility] [phone]  as patient is from the ALF. Clarene Critchley advised that patient can return to ALF and have therapy on site through [facility].    CSW will f/u for disposition as rehabilitation pending.  Elissa Hefty, LCSW Clinical Social Worker [phone]

## 2018-06-10 NOTE — Progress Notes (Signed)
Orthopedic Trauma Service Progress Note   Patient ID: [name] MRN: [id number] DOB/AGE: [birth year] 82 y.o.  Subjective:  Nursing note reviewed Pt appears more calm currently  In bed Appears to be doing ok    ROS As above   Objective:   VITALS:   Vitals:   06/09/18 1842 06/09/18 2140 06/09/18 2357 06/10/18 0427  BP: (!) 119/43 (!) 129/52 (!) 124/52 (!) 140/53  Pulse: 62 66 (!) 58 (!) 55  Resp: 18 18    Temp: 98.5 F (36.9 C) 97.6 F (36.4 C) 98.5 F (36.9 C) (!) 97.4 F (36.3 C)  TempSrc: Oral Axillary Oral Oral  SpO2: 98%  99% 96%  Weight:      Height:        Estimated body mass index is 23.72 kg/m as calculated from the following:   Height as of this encounter: 5\' 7"  (1.702 m).   Weight as of this encounter: 68.7 kg (151 lb 7.3 oz).   Intake/Output      06/11 0701 - 06/12 0700 06/12 0701 - 06/13 0700   P.O. 120    I.V. (mL/kg) 1166.2 (17)    IV Piggyback 0    Total Intake(mL/kg) 1286.2 (18.7)    Drains 50    Blood 75    Total Output 125    Net +1161.2         Urine Occurrence 3 x      LABS  Results for orders placed or performed during the hospital encounter of 06/09/18 (from the past 24 hour(s))  Type and screen [place]     Status: None (Preliminary result)   Collection Time: 06/09/18 10:05 AM  Result Value Ref Range   ABO/RH(D) O NEG    Antibody Screen POS    Sample Expiration 06/12/2018    Antibody Identification      ANTI D Performed at [facility], [place] [address] [place], [place] [address]    Unit Number [id number]    Blood Component Type RED CELLS,LR    Unit division 00    Status of Unit ALLOCATED    Donor AG Type NEGATIVE FOR C ANTIGEN    Transfusion Status OK TO TRANSFUSE    Crossmatch Result COMPATIBLE    Unit Number [id number]    Blood Component Type RED CELLS,LR    Unit division 00    Status of Unit ALLOCATED    Donor AG Type NEGATIVE FOR C ANTIGEN    Transfusion Status OK TO TRANSFUSE    Crossmatch Result COMPATIBLE   Glucose, capillary     Status: Abnormal   Collection Time: 06/09/18  2:19 PM  Result Value Ref Range   Glucose-Capillary 265 (H) 65 - 99 mg/dL   Comment 1 Notify RN    Comment 2 Document in Chart   Glucose, capillary     Status: Abnormal   Collection Time: 06/09/18  3:19 PM  Result Value Ref Range   Glucose-Capillary 257 (H) 65 - 99 mg/dL   Comment 1 Notify RN    Comment 2 Document in Chart   Hemoglobin A1c     Status: Abnormal   Collection Time: 06/09/18  6:55 PM  Result Value Ref Range   Hgb A1c MFr Bld 7.5 (H) 4.8 - 5.6 %   Mean Plasma Glucose 168.55 mg/dL  TSH     Status: Abnormal   Collection Time: 06/09/18  8:50 PM  Result Value Ref Range   TSH 7.129 (H) 0.350 -  4.500 uIU/mL  T4, free     Status: None   Collection Time: 06/09/18  8:50 PM  Result Value Ref Range   Free T4 1.09 0.82 - 1.77 ng/dL  Glucose, capillary     Status: Abnormal   Collection Time: 06/09/18 10:39 PM  Result Value Ref Range   Glucose-Capillary 223 (H) 65 - 99 mg/dL   Comment 1 Notify RN    Comment 2 Document in Chart   Glucose, capillary     Status: Abnormal   Collection Time: 06/10/18  9:04 AM  Result Value Ref Range   Glucose-Capillary 250 (H) 65 - 99 mg/dL    CBG (last 3)  Recent Labs    06/09/18 1519 06/09/18 2239 06/10/18 0904  GLUCAP 257* 223* 250*     PHYSICAL EXAM:   Gen: awake, NAD Ext:       Right Lower Extremity   Dressing c/d/i  Night splint fitting well  VAC functioning   Ext warm    Diminished sensation along DPN, SPN, and TN nvs   EHL, FHL, lesser toe motor functions grossly intact    Assessment/Plan: 1 Day Post-Op   Principal Problem:   Tibia and fibula open fracture, right Active Problems:   Hypothyroidism   Chronic kidney disease, stage III (moderate) (HCC)   Controlled diabetes mellitus with diabetic neuropathy, with long-term current use of insulin (HCC)   Unspecified atrial  fibrillation (HCC)   Essential hypertension   HCAP (healthcare-associated pneumonia)   Anti-infectives (From admission, onward)   Start     Dose/Rate Route Frequency Ordered Stop   06/11/18 1100  vancomycin (VANCOCIN) IVPB 1000 mg/200 mL premix  Status:  Discontinued     1,000 mg 200 mL/hr over 60 Minutes Intravenous Every 48 hours 06/09/18 0954 06/09/18 1842   06/10/18 1000  ceFAZolin (ANCEF) IVPB 1 g/50 mL premix     1 g 100 mL/hr over 30 Minutes Intravenous Every 24 hours 06/09/18 1905 06/13/18 0959   06/10/18 0915  ceFEPIme (MAXIPIME) 1 g in sodium chloride 0.9 % 100 mL IVPB     1 g 200 mL/hr over 30 Minutes Intravenous Every 8 hours 06/10/18 0907 06/18/18 0559   06/09/18 1234  vancomycin (VANCOCIN) powder  Status:  Discontinued       As needed 06/09/18 1234 06/09/18 1348   06/09/18 1233  tobramycin (NEBCIN) injection  Status:  Discontinued       As needed 06/09/18 1233 06/09/18 1348   06/09/18 1015  ceFAZolin (ANCEF) IVPB 2g/100 mL premix     2 g 200 mL/hr over 30 Minutes Intravenous On call to O.R. 06/09/18 1011 06/09/18 1143   06/09/18 1000  vancomycin (VANCOCIN) IVPB 1000 mg/200 mL premix     1,000 mg 200 mL/hr over 60 Minutes Intravenous  Once 06/09/18 0949 06/09/18 1134   06/09/18 0945  ceFEPIme (MAXIPIME) 2 g in sodium chloride 0.9 % 100 mL IVPB  Status:  Discontinued     2 g 200 mL/hr over 30 Minutes Intravenous  Once 06/09/18 0939 06/09/18 1842    .  POD/HD#: 1  84 y/o female s/p fall with grade 3 b open R tibia and fibula fracture   -fall  - Grade 3B open R tibia and fibula fracture s/p IMN  NWB R leg  Wound vac on complex wound   Due to age and associated comorbid conditions pt is not a candidate for flap coverage   Will change vac on  6/19 at office  Pt will need home VAC unit    PT/OT evals  Night splint on at all times, please remove periodically to evaluate posterior soft tissue  Knee ROM as tolerated   - Pain management:  Tylenol  Minimize  narcotics so as not to exacerbate confusion   - ABL anemia/Hemodynamics  Check cbc in am  - Medical issues   Per primary   DM   Tight sugar control to minimize complications    Goal is for cbgs to be <200 mg/dL  - DVT/PE prophylaxis:  ASA 325 daily   - ID:   Ancef x 72 hours   - Metabolic Bone Disease:  + osteoporosis  - Activity:  Up with assistance  - FEN/GI prophylaxis/Foley/Lines:  Carb mod diet   - Impediments to fracture healing:  Open fracture   DM    - Dispo:  PT/OT evals  Dressing change 6/19    Jari Pigg, PA-C [facility] [phone] Levi Aland (C) 06/10/2018, 9:31 AM

## 2018-06-10 NOTE — Clinical Social Work Note (Signed)
Clinical Social Work Assessment  Patient Details  Name: [name] MRN: [id number] Date of Birth: July 18, 1915  Date of referral:  06/10/18               Reason for consult:  Facility Placement                Permission sought to share information with:  Facility Art therapist granted to share information::  Yes, Verbal Permission Granted  Name::     Surveyor, minerals::  SNF  Relationship::     Contact Information:     Housing/Transportation Living arrangements for the past 2 months:  [place] of Information:  Adult Children, Facility Patient Interpreter Needed:  None Criminal Activity/Legal Involvement Pertinent to Current Situation/Hospitalization:  No - Comment as needed Significant Relationships:  Adult Children, Other Family Members Lives with:  Facility Resident Do you feel safe going back to the place where you live?  Yes Need for family participation in patient care:  Yes (Comment)  Care giving concerns:  Pt from ALF-[facility] and will need rehab. Pt can receive rehab at ALF and family desires her to return.  Social Worker assessment / plan:  CSW confirmed return to ALF with son Ulice Dash and admission staff. Pt will return to ALF for rehab and care. CSW will f/u for disposition. Clarene Critchley is staff at [facility], [phone].  CSW will f/u for disposition.  Employment status:  Retired Nurse, adult PT Recommendations:  [place] / Referral to community resources:  [place]  Patient/Family's Response to care:  [employer].  Patient/Family's Understanding of and Emotional Response to Diagnosis, Current Treatment, and Prognosis:  Family has good understanding of impairment and desire patient to return to ALF.  Family feels that patient will do better at ALF given her familiarity, age and impairment.  Family appreciative of CSW f/u and assisting  with disposition.  Emotional Assessment Appearance:  Appears stated age Attitude/Demeanor/Rapport:  (Cooperative) Affect (typically observed):  Accepting, Appropriate Orientation:  Oriented to Situation, Oriented to Place, Oriented to Self Alcohol / Substance use:  Not Applicable Psych involvement (Current and /or in the community):  No (Comment)  Discharge Needs  Concerns to be addressed:  Discharge Planning Concerns Readmission within the last 30 days:  No Current discharge risk:  Dependent with Mobility, Physical Impairment Barriers to Discharge:  No Barriers Identified   [place] Baxter, LCSW 06/10/2018, 1:50 PM

## 2018-06-10 NOTE — ED Notes (Signed)
Pts son and daughter in law at bedside speaking with [place], [place] re: plan of care, informed consent for surgery to be completed by surgeon once pt arrives to short stay

## 2018-06-10 NOTE — Anesthesia Postprocedure Evaluation (Signed)
Anesthesia Post Note  Patient: [name]  Procedure(s) Performed: IRRIGATION AND DEBRIDEMENT EXTREMITY (Right ) INTRAMEDULLARY (IM) NAIL TIBIAL (Right )     Patient location during evaluation: PACU Anesthesia Type: General Level of consciousness: awake and alert Pain management: pain level controlled Vital Signs Assessment: post-procedure vital signs reviewed and stable Respiratory status: spontaneous breathing, nonlabored ventilation and respiratory function stable Cardiovascular status: blood pressure returned to baseline and stable Postop Assessment: no apparent nausea or vomiting Anesthetic complications: no    Last Vitals:  Vitals:   06/09/18 2357 06/10/18 0427  BP: (!) 124/52 (!) 140/53  Pulse: (!) 58 (!) 55  Resp:    Temp: 36.9 C (!) 36.3 C  SpO2: 99% 96%    Last Pain:  Vitals:   06/10/18 0427  TempSrc: Oral  PainSc:                  Catalina Gravel

## 2018-06-10 NOTE — Progress Notes (Signed)
Pharmacy Antibiotic Note  [name] is a 84 y.o. female admitted on 06/09/2018 s/p fall with open fracture.  Pt now s/p OR for repair.   She was started on vancomycin + cefepime yesterday for PNA > these were changed to Ancef only for surgical prophylaxis coverage. Now to resume vancomycin+ cefepime per primary.   Plan: Discontinue Ancef as duplicate coverage Vancomycin 1g IV Q48H, next dose due 6/13 Cefepime 1g IV q24h F/u renal fxn, C&S, clinical status and trough at SS  Height: 5\' 7"  (170.2 cm) Weight: 151 lb 7.3 oz (68.7 kg)(per last admission) IBW/kg (Calculated) : 61.6  Temp (24hrs), Avg:98 F (36.7 C), Min:97 F (36.1 C), Max:98.6 F (37 C)  Recent Labs  Lab 06/09/18 0844  WBC 12.6*  CREATININE 3.29*    Estimated Creatinine Clearance: 8.8 mL/min (A) (by C-G formula based on SCr of 3.29 mg/dL (H)).    Allergies  Allergen Reactions  . Capsaicin Other (See Comments)    Topical application BURNED the skin   Vancomycin 6/11>> Cefepime 6/11>> Ancef 6/11 >> 6/12  6/11 BCx: MRSA: Sputum:   Thank you for allowing pharmacy to be a part of this patient's care. Josselyn Harkins D. Patrina Andreas, PharmD, BCPS Clinical Pharmacist [phone] Please check AMION for all [place] numbers 06/10/2018 9:43 AM

## 2018-06-10 NOTE — Progress Notes (Signed)
Pt is very confused, states that "she has not had her insulin in 2-3 days and that she is going to die" RN then proceeded to ask her can she take her Blood sugar so she can get the appropriate amount of insulin the patient refused, RN educated and pt still said no. RN waited 20 minutes and re entered room with charge nurse to try and get the patient to cooperate. Charge nurse tried to get patient to get her sugar checked pt still refused, we opened the blinds and attempted to sit the patient up but she refused and did not want [place] to touch her. Will return in 30 minutes to try again if no success RN will contact MD and notify him about what is happening.

## 2018-06-10 NOTE — Consult Note (Signed)
[place] Nurse wound consult note Reason for Consult: small opening at coccyx inside of intragluteal cleft Wound type:moisture, friction Pressure Injury POA: N/A Measurement: 0.2cm round x 0.1cm Wound DVO:UZHQ moist Drainage (amount, consistency, odor) scant serous Periwound: macerated Dressing procedure/placement/frequency: I have provided orders for placement of our house silicone foam dressing and instructions for placement so that dressing makes contact with affected tissue (creasing prior to application).  I have also provided a pressure redistribution chair pad for OOB use for her return to the SNF as well as a Pressure Redistribution heel boot for the on-operative foot.  [facility] nursing team will not follow, but will remain available to this patient, the nursing and medical teams.  Please re-consult if needed. Thanks, Maudie Flakes, MSN, RN, [place], Arther Abbott  Pager# [phone]

## 2018-06-11 ENCOUNTER — Inpatient Hospital Stay (HOSPITAL_COMMUNITY): Payer: Medicare HMO

## 2018-06-11 DIAGNOSIS — D649 Anemia, unspecified: Secondary | ICD-10-CM | POA: Diagnosis not present

## 2018-06-11 LAB — HEMOGLOBIN AND HEMATOCRIT, BLOOD
HEMATOCRIT: 27.1 % — AB (ref 36.0–46.0)
Hemoglobin: 8.7 g/dL — ABNORMAL LOW (ref 12.0–15.0)

## 2018-06-11 LAB — BASIC METABOLIC PANEL
Anion gap: 9 (ref 5–15)
BUN: 64 mg/dL — AB (ref 6–20)
CO2: 23 mmol/L (ref 22–32)
CREATININE: 3 mg/dL — AB (ref 0.44–1.00)
Calcium: 9.7 mg/dL (ref 8.9–10.3)
Chloride: 106 mmol/L (ref 101–111)
GFR calc non Af Amer: 12 mL/min — ABNORMAL LOW (ref >60)
GFR, EST AFRICAN AMERICAN: 14 mL/min — AB (ref >60)
GLUCOSE: 170 mg/dL — AB (ref 65–99)
Potassium: 4.9 mmol/L (ref 3.5–5.1)
Sodium: 138 mmol/L (ref 135–145)

## 2018-06-11 LAB — URINE CULTURE: CULTURE: NO GROWTH

## 2018-06-11 LAB — CBC
HCT: 23 % — ABNORMAL LOW (ref 36.0–46.0)
Hemoglobin: 7.3 g/dL — ABNORMAL LOW (ref 12.0–15.0)
MCH: 32 pg (ref 26.0–34.0)
MCHC: 31.7 g/dL (ref 30.0–36.0)
MCV: 100.9 fL — AB (ref 78.0–100.0)
PLATELETS: 107 10*3/uL — AB (ref 150–400)
RBC: 2.28 MIL/uL — AB (ref 3.87–5.11)
RDW: 13.1 % (ref 11.5–15.5)
WBC: 10.2 10*3/uL (ref 4.0–10.5)

## 2018-06-11 LAB — GLUCOSE, CAPILLARY
Glucose-Capillary: 110 mg/dL — ABNORMAL HIGH (ref 65–99)
Glucose-Capillary: 167 mg/dL — ABNORMAL HIGH (ref 65–99)
Glucose-Capillary: 128 mg/dL — ABNORMAL HIGH (ref 65–99)
Glucose-Capillary: 88 mg/dL (ref 65–99)

## 2018-06-11 LAB — PREPARE RBC (CROSSMATCH)

## 2018-06-11 LAB — HIV ANTIBODY (ROUTINE TESTING W REFLEX): HIV SCREEN 4TH GENERATION: NONREACTIVE

## 2018-06-11 LAB — LEGIONELLA PNEUMOPHILA SEROGP 1 UR AG: L. pneumophila Serogp 1 Ur Ag: NEGATIVE

## 2018-06-11 MED ORDER — AMOXICILLIN-POT CLAVULANATE 875-125 MG PO TABS: 1.0000 | ORAL_TABLET | Freq: Two times a day (BID) | ORAL | Status: DC

## 2018-06-11 MED ORDER — DIPHENHYDRAMINE HCL 50 MG/ML IJ SOLN
12.5000 mg | Freq: Once | INTRAMUSCULAR | Status: AC
  Administered 2018-06-11: 12.5 mg via INTRAVENOUS
  Filled 2018-06-11: qty 1

## 2018-06-11 MED ORDER — PHENOL 1.4 % MT LIQD
1.0000 | OROMUCOSAL | Status: DC | PRN
  Administered 2018-06-11: 1 via OROMUCOSAL
  Filled 2018-06-11: qty 177

## 2018-06-11 MED ORDER — SODIUM CHLORIDE 0.9 % IV SOLN
1.0000 g | INTRAVENOUS | Status: DC
  Administered 2018-06-11 – 2018-06-12 (×2): 1 g via INTRAVENOUS
  Filled 2018-06-11 (×3): qty 1

## 2018-06-11 MED ORDER — SODIUM CHLORIDE 0.9 % IV SOLN
Freq: Once | INTRAVENOUS | Status: AC
  Administered 2018-06-11: 12:00:00 via INTRAVENOUS

## 2018-06-11 MED ORDER — ACETAMINOPHEN 325 MG PO TABS
650.0000 mg | ORAL_TABLET | Freq: Once | ORAL | Status: DC
  Filled 2018-06-11: qty 2

## 2018-06-11 MED ORDER — INSULIN ASPART 100 UNIT/ML ~~LOC~~ SOLN
0.0000 [IU] | Freq: Three times a day (TID) | SUBCUTANEOUS | Status: DC
  Administered 2018-06-12 (×2): 2 [IU] via SUBCUTANEOUS
  Administered 2018-06-12: 1 [IU] via SUBCUTANEOUS
  Administered 2018-06-13: 2 [IU] via SUBCUTANEOUS
  Administered 2018-06-13: 3 [IU] via SUBCUTANEOUS
  Administered 2018-06-14: 2 [IU] via SUBCUTANEOUS
  Administered 2018-06-14: 3 [IU] via SUBCUTANEOUS
  Administered 2018-06-15 – 2018-06-17 (×6): 2 [IU] via SUBCUTANEOUS

## 2018-06-11 NOTE — Progress Notes (Addendum)
PROGRESS NOTE    [name]  OEV:[id number] DOB: 01/28/1915 DOA: 06/09/2018 PCP: Leanna Battles, MD   Brief Narrative:  [name] is a 84 y.o. female with medical history significant of DM; hypothyroidism; HTN; and CKD stage 3 presenting with a fall at SNF.  She thinks she got up and had a mechanical fall.  Her leg was clearly broken with an open and very dislocated fracture.    PDX was consulted and patient underwent surgery 06/09/2018.  Urinalysis done concerning for UTI.  Chest x-ray done concerning for pneumonia.     Assessment & Plan:   Principal Problem:   Tibia and fibula open fracture, right Active Problems:   Hypothyroidism   Chronic kidney disease, stage III (moderate) (HCC)   Controlled diabetes mellitus with diabetic neuropathy, with long-term current use of insulin (HCC)   Unspecified atrial fibrillation (HCC)   Essential hypertension   HCAP (healthcare-associated pneumonia)   Acute lower UTI   Postoperative anemia  #1 grade 3B open right tibia-fibula fracture Secondary to mechanical fall.  Patient status post I&D of open right tibia fracture including removal of bone, IM nailing of right tibia, complex partial closure right tibial traumatic wound and skin tears, infuse allografting, placement of small wound VAC and antibiotic beads per orthopedics.  PT/OT.  Per orthopedics.  2.  HCAP Noted on chest x-ray admission.  Sputum Gram stain and culture pending.  Blood cultures pending with no growth to date.  Tinel antigen negative.  Urine pneumococcus antigen negative.  Discontinue IV vancomycin.  Continue IV cefepime.  Follow.    3.  Bacteria in urine Urine cultures negative.   4.  Hypothyroidism TSH elevated at 7.129.  Free T4 within normal limits.  Continue home dose Synthroid.  Repeat thyroid function studies in about 4 to 6 weeks.  Follow.  5.  Atrial fibrillation Noted on EKG on presentation.  Patient with no prior history of A. fib.  Patient  currently rate controlled.  Continue beta-blocker.  Patient would be a poor candidate for anticoagulation secondary to age, fall risks.  Will recommend aspirin postop and when okay with orthopedics.  6.  Acute on chronic kidney disease stage III Gentle hydration.  Continue to hold ARB. Check Renal US. Follow  7.  Hypertension Continue current regimen of norvasc, lopressor.  8.  Hyperlipidemia Continue statin.  9.  Diabetes mellitus II Hemoglobin A1c 7.5.  CBGs 88 - 167.  Continue to hold oral hypoglycemic agents.  Continue Lantus and sliding scale insulin for now.  10.  Postop anemia Hemoglobin currently at 7.3 from 10.9 on day of admission.  Likely secondary to acute blood loss perioperatively.  Patient with no overt bleeding.  Transfuse 1 unit packed red blood cells.  Follow H&H.    DVT prophylaxis: SCDs/postop per orthopedics Code Status: DNR Family Communication: Updated patient.  No family at bedside. Disposition Plan: Skilled nursing facility.   Consultants:   Orthopedics: Dr. Marcelino Scot 06/09/2018  Procedures:   CT head CT C-spine 06/09/2018  Films of the right ankle 06/09/2018, chest x-ray 06/09/2018  Plain films of the right tib-fib 06/09/2018  I and D of open right tibia fracture including removal of bone/IM nailing of right tibia/complex partial closure right tibial traumatic wound and skin tears 8 cm/infuse allografting/application of small wound VAC/placement of antibiotic beads per Dr. Marcelino Scot 06/09/2018  1 unit PRBCs. 06/11/2018  Renal US 06/11/2018  Antimicrobials:   IV Cefepime 06/09/2018  IV vancomycin 06/09/2018>>>>>>06/10/2018   Subjective: Sleeping. Easily  arousable. No CP, no SOB.  Objective: Vitals:   06/11/18 1040 06/11/18 1139 06/11/18 1200 06/11/18 1300  BP: (!) 149/60 (!) 153/63 (!) 145/49 (!) 156/55  Pulse: 62 66 70 65  Resp:  15 16 16   Temp:  98.6 F (37 C) 97.9 F (36.6 C)   TempSrc:  Oral Oral   SpO2:  95% 93% 95%  Weight:      Height:         Intake/Output Summary (Last 24 hours) at 06/11/2018 1411 Last data filed at 06/11/2018 1100 Gross per 24 hour  Intake 1153.17 ml  Output 250 ml  Net 903.17 ml   Filed Weights   06/09/18 0941  Weight: 68.7 kg (151 lb 7.3 oz)    Examination:  General exam: Sleeping.NAD. Respiratory system: CTAB anterior lung fields. Respiratory effort normal. Cardiovascular system: RRR no m/r/g. No LE edema. No m/r/g. Gastrointestinal system: Abdomen is soft/NT/ND/+BS.  Central nervous system: Alert and oriented. No focal neurological deficits. Extremities: Symmetric 5 x 5 power. Skin: No rashes, lesions or ulcers Psychiatry: Judgement and insight appear normal. Mood & affect appropriate.     Data Reviewed: I have personally reviewed following labs and imaging studies  CBC: Recent Labs  Lab 06/09/18 0844 06/10/18 1620 06/11/18 0557  WBC 12.6* 14.5* 10.2  NEUTROABS 9.5*  --   --   HGB 10.9* 7.8* 7.3*  HCT 33.7* 24.3* 23.0*  MCV 100.0 101.7* 100.9*  PLT 138* 120* 403*   Basic Metabolic Panel: Recent Labs  Lab 06/09/18 0844 06/10/18 1620 06/11/18 0557  NA 137 137 138  K 4.5 3.8 4.9  CL 103 105 106  CO2 24 21* 23  GLUCOSE 209* 56* 170*  BUN 65* 65* 64*  CREATININE 3.29* 2.93* 3.00*  CALCIUM 10.2 9.6 9.7  MG  --  1.8  --    GFR: Estimated Creatinine Clearance: 9.7 mL/min (A) (by C-G formula based on SCr of 3 mg/dL (H)). Liver Function Tests: No results for input(s): AST, ALT, ALKPHOS, BILITOT, PROT, ALBUMIN in the last 168 hours. No results for input(s): LIPASE, AMYLASE in the last 168 hours. No results for input(s): AMMONIA in the last 168 hours. Coagulation Profile: Recent Labs  Lab 06/09/18 0844  INR 1.07   Cardiac Enzymes: No results for input(s): CKTOTAL, CKMB, CKMBINDEX, TROPONINI in the last 168 hours. BNP (last 3 results) No results for input(s): PROBNP in the last 8760 hours. HbA1C: Recent Labs    06/09/18 1855  HGBA1C 7.5*   CBG: Recent Labs  Lab  06/10/18 1146 06/10/18 1642 06/10/18 1721 06/10/18 2103 06/11/18 1102  GLUCAP 201* 68 110* 185* 128*   Lipid Profile: No results for input(s): CHOL, HDL, LDLCALC, TRIG, CHOLHDL, LDLDIRECT in the last 72 hours. Thyroid Function Tests: Recent Labs    06/09/18 2050  TSH 7.129*  FREET4 1.09   Anemia Panel: No results for input(s): VITAMINB12, FOLATE, FERRITIN, TIBC, IRON, RETICCTPCT in the last 72 hours. Sepsis Labs: No results for input(s): PROCALCITON, LATICACIDVEN in the last 168 hours.  Recent Results (from the past 240 hour(s))  Culture, blood (routine x 2)     Status: None (Preliminary result)   Collection Time: 06/09/18 10:05 AM  Result Value Ref Range Status   Specimen Description BLOOD RIGHT FOREARM  Final   Special Requests   Final    BOTTLES DRAWN AEROBIC AND ANAEROBIC Blood Culture adequate volume   Culture   Final    NO GROWTH 2 DAYS Performed at [facility]  Lab, [address] [place], [place] [address]    Report Status PENDING  Incomplete  Urine Culture     Status: None   Collection Time: 06/10/18 11:04 AM  Result Value Ref Range Status   Specimen Description URINE, CLEAN CATCH  Final   Special Requests NONE  Final   Culture   Final    NO GROWTH Performed at [facility], [place] [address], [place], [place] [address]    Report Status 06/11/2018 FINAL  Final  MRSA PCR Screening     Status: None   Collection Time: 06/10/18 11:04 AM  Result Value Ref Range Status   MRSA by PCR NEGATIVE NEGATIVE Final    Comment:        The GeneXpert MRSA Assay (FDA approved for NASAL specimens only), is one component of a comprehensive MRSA colonization surveillance program. It is not intended to diagnose MRSA infection nor to guide or monitor treatment for MRSA infections. Performed at [facility], [place] [address], [place], [place] [address]          Radiology Studies: US Renal  Result Date: 06/11/2018 CLINICAL DATA:  Acute renal failure EXAM:  RENAL / URINARY TRACT ULTRASOUND COMPLETE COMPARISON:  CT 12/27/2017 FINDINGS: Right Kidney: Length: 8.6 cm. Cortical thinning and increased echotexture. No mass or hydronephrosis. Left Kidney: Length: 9.2 cm. Cortical thinning and increased echotexture. Moderate left hydronephrosis, stable since prior CT. Bladder: Posterior bladder wall thickening noted, similar to prior CT. IMPRESSION: Cortical thinning and increased echotexture throughout the kidneys bilaterally compatible with chronic medical renal disease. Moderate left hydronephrosis is stable since prior CT. Posterior bladder wall thickening is similar to prior CT. Given the asymmetric localized nature to the posterior bladder wall, cannot exclude bladder cancer. Consider further evaluation with cystoscopy if felt clinically indicated. Electronically Signed   By: Rolm Baptise M.D.   On: 06/11/2018 10:27   Dg Tibia/fibula Right Port  Result Date: 06/09/2018 CLINICAL DATA:  Open tibial fracture fixation EXAM: PORTABLE RIGHT TIBIA AND FIBULA - 2 VIEW COMPARISON:  Intraoperative study from 06/09/2018 FINDINGS: Intramedullary nail fixation of the right tibia is noted without postoperative complicating features noted. A wound VAC is seen along the medial aspect of the distal right leg with antibiotic impregnated beads projecting over the distal third of the tibial diaphysis anteriorly. Fracture of the fibular neck and junction of the middle and distal third are noted as before. IMPRESSION: 1. IM nail fixation of open tibial fracture with wound vac device along the medial aspect of the distal right leg noted and antibiotic impregnated beads projecting over the anterior aspect of the distal right leg. Alignment is near anatomic. 2. Fibular neck and distal fibular diaphyseal fractures without significant displacement as before. Electronically Signed   By: Ashley Royalty M.D.   On: 06/09/2018 20:00        Scheduled Meds: . acetaminophen  500 mg Oral Q12H  .  acetaminophen  650 mg Oral Once  . amLODipine  2.5 mg Oral Daily  . aspirin EC  325 mg Oral Daily  . chlorhexidine  60 mL Topical Once  . docusate sodium  100 mg Oral BID  . enoxaparin (LOVENOX) injection  30 mg Subcutaneous Q24H  . ferrous sulfate  325 mg Oral Q breakfast  . insulin aspart  0-9 Units Subcutaneous TID WC  . insulin glargine  7 Units Subcutaneous Daily  . ipratropium  2 spray Each Nare TID AC  . levothyroxine  88 mcg  Oral QAC breakfast  . metoprolol tartrate  25 mg Oral BID  . montelukast  10 mg Oral Daily  . pantoprazole  40 mg Oral Daily  . polyethylene glycol  17 g Oral Daily  . povidone-iodine  2 application Topical Once  . pravastatin  20 mg Oral Daily  . pregabalin  100 mg Oral Daily  . senna-docusate  1 tablet Oral Daily   Continuous Infusions: . ceFEPime (MAXIPIME) IV       LOS: 2 days    Time spent: 35 minutes    Irine Seal, MD [facility] Pager [phone]  If 7PM-7AM, please contact night-coverage [email or web address] Password [facility] 06/11/2018, 2:11 PM

## 2018-06-11 NOTE — NC FL2 (Signed)
[place] MEDICAID FL2 LEVEL OF CARE SCREENING TOOL     IDENTIFICATION  Patient Name: [name] Birthdate: [birth year] Sex: female Admission Date (Current Location): 06/09/2018  [facility] and [place] Number:  Herbalist and Address:  [facility], [place] [address] [place], [place] [address]      Provider Number: [id number]  Attending Physician Name and Address:  Eugenie Filler, MD  Relative Name and Phone Number:  Sicilia Killough, son, [phone]    Current Level of Care: Hospital Recommended Level of Care: [place] Prior Approval Number:    Date Approved/Denied:   PASRR Number: [id number]  Discharge Plan: Other (Comment)(Assisted Living Facility)    Current Diagnoses: Patient Active Problem List   Diagnosis Date Noted  . HCAP (healthcare-associated pneumonia) 06/10/2018  . Acute lower UTI 06/10/2018  . Tibia and fibula open fracture, right 06/09/2018  . Unspecified atrial fibrillation ([place]) 06/09/2018  . Essential hypertension 06/09/2018  . Pyelonephritis 12/27/2017  . ARF (acute renal failure) ([place]) 12/27/2017  . Corns and callosities 12/17/2016  . Palpitations 05/08/2016  . Mobitz type 1 second degree AV block 05/08/2016  . Trifascicular block 05/08/2016  . Protein-calorie malnutrition, severe 05/01/2016  . Hypertensive urgency 04/30/2016  . Hypertensive urgency, malignant 04/30/2016  . Sepsis due to pneumonia ([place]) 12/06/2015  . Acute respiratory failure with hypoxia ([place]) 12/06/2015  . Controlled diabetes mellitus with diabetic neuropathy, with long-term current use of insulin ([place]) 12/06/2015  . Dyslipidemia associated with type 2 diabetes mellitus ([place]) 12/06/2015  . Leukocytosis 12/06/2015  . Thrombocytopenia ([place]) 12/06/2015  . COPD exacerbation ([place]) 12/06/2015  . Cerebellar stroke ([place]) 03/06/2015  . Chronic kidney disease, stage III (moderate) ([place]) 08/11/2013  . Hypothyroidism 03/15/2013  .  Diabetic neuropathy ([place]) 03/15/2013    Orientation RESPIRATION BLADDER Height & Weight     Self, Situation, Place  O2(nasal cannula) Continent Weight: 151 lb 7.3 oz (68.7 kg)(per last admission) Height:  5\' 7"  (170.2 cm)  BEHAVIORAL SYMPTOMS/MOOD NEUROLOGICAL BOWEL NUTRITION STATUS      Continent Diet(See DC Summary)  AMBULATORY STATUS COMMUNICATION OF NEEDS Skin   Extensive Assist Verbally Surgical wounds                       Personal Care Assistance Level of Assistance  Bathing, Feeding, Dressing Bathing Assistance: Maximum assistance Feeding assistance: Maximum assistance Dressing Assistance: Maximum assistance     Functional Limitations Info  Sight, Hearing, Speech Sight Info: Adequate Hearing Info: Adequate Speech Info: Adequate    SPECIAL CARE FACTORS FREQUENCY  PT (By licensed PT), OT (By licensed OT)     PT Frequency: 3x week OT Frequency: 3x week            Contractures      Additional Factors Info  Code Status, Allergies, Insulin Sliding Scale Code Status Info: DNR Allergies Info: CAPSAICIN    Insulin Sliding Scale Info: Insulin daily       Current Medications (06/11/2018):  This is the current hospital active medication list Current Facility-Administered Medications  Medication Dose Route Frequency Provider Last Rate Last Dose  . 0.9 %  sodium chloride infusion   Intravenous Continuous Karmen Bongo, MD   Stopped at 06/10/18 1524  . 0.9 %  sodium chloride infusion   Intravenous Continuous Eugenie Filler, MD 75 mL/hr at 06/11/18 0038    . acetaminophen (TYLENOL) tablet 650 mg  650 mg Oral Q6H PRN Karmen Bongo, MD  Or  . acetaminophen (TYLENOL) suppository 650 mg  650 mg Rectal Q6H PRN Karmen Bongo, MD      . acetaminophen (TYLENOL) tablet 500 mg  500 mg Oral Q12H Ainsley Spinner, PA-C   500 mg at 06/10/18 2047  . albuterol (PROVENTIL) (2.5 MG/3ML) 0.083% nebulizer solution 2.5 mg  2.5 mg Inhalation Q4H PRN Karmen Bongo, MD       . amLODipine (NORVASC) tablet 2.5 mg  2.5 mg Oral Daily Karmen Bongo, MD   2.5 mg at 06/10/18 0926  . aspirin EC tablet 325 mg  325 mg Oral Daily Ainsley Spinner, PA-C   325 mg at 06/10/18 9924  . ceFEPIme (MAXIPIME) 1 g in sodium chloride 0.9 % 100 mL IVPB  1 g Intravenous Q24H Eugenie Filler, MD   Stopped at 06/10/18 1332  . chlorhexidine (HIBICLENS) 4 % liquid 4 application  60 mL Topical Once Karmen Bongo, MD      . docusate sodium (COLACE) capsule 100 mg  100 mg Oral BID Karmen Bongo, MD   100 mg at 06/10/18 2201  . enoxaparin (LOVENOX) injection 30 mg  30 mg Subcutaneous Q24H Karmen Bongo, MD   30 mg at 06/10/18 0934  . ferrous sulfate tablet 325 mg  325 mg Oral Q breakfast Karmen Bongo, MD   325 mg at 06/10/18 0928  . HYDROcodone-acetaminophen (NORCO/VICODIN) 5-325 MG per tablet 1-2 tablet  1-2 tablet Oral Q4H PRN Ainsley Spinner, PA-C      . insulin aspart (novoLOG) injection 0-15 Units  0-15 Units Subcutaneous TID WC Karmen Bongo, MD   3 Units at 06/11/18 0735  . insulin glargine (LANTUS) injection 7 Units  7 Units Subcutaneous Daily Karmen Bongo, MD   7 Units at 06/10/18 0920  . ipratropium (ATROVENT) 0.06 % nasal spray 2 spray  2 spray Each Nare TID Meliton Rattan, MD   2 spray at 06/10/18 0933  . levothyroxine (SYNTHROID, LEVOTHROID) tablet 88 mcg  88 mcg Oral QAC breakfast Eugenie Filler, MD      . metoCLOPramide [facility]) tablet 5-10 mg  5-10 mg Oral Q8H PRN Ainsley Spinner, PA-C       Or  . metoCLOPramide (REGLAN) injection 5-10 mg  5-10 mg Intravenous Q8H PRN Ainsley Spinner, PA-C      . metoprolol tartrate (LOPRESSOR) tablet 25 mg  25 mg Oral BID Karmen Bongo, MD   25 mg at 06/10/18 2200  . montelukast (SINGULAIR) tablet 10 mg  10 mg Oral Daily Karmen Bongo, MD   10 mg at 06/10/18 0926  . morphine 2 MG/ML injection 0.5-1 mg  0.5-1 mg Intravenous Q2H PRN Ainsley Spinner, PA-C      . ondansetron [facility]) tablet 4 mg  4 mg Oral Q6H PRN Karmen Bongo, MD       Or   . ondansetron [facility]) injection 4 mg  4 mg Intravenous Q6H PRN Karmen Bongo, MD      . pantoprazole (PROTONIX) EC tablet 40 mg  40 mg Oral Daily Karmen Bongo, MD   40 mg at 06/10/18 0928  . phenol (CHLORASEPTIC) mouth spray 1 spray  1 spray Mouth/Throat PRN Eugenie Filler, MD   1 spray at 06/11/18 0113  . polyethylene glycol (MIRALAX / GLYCOLAX) packet 17 g  17 g Oral Daily Karmen Bongo, MD   17 g at 06/10/18 0932  . povidone-iodine 10 % swab 2 application  2 application Topical Once Karmen Bongo, MD      . pravastatin (PRAVACHOL) tablet 20  mg  20 mg Oral Daily Karmen Bongo, MD   20 mg at 06/10/18 0926  . pregabalin (LYRICA) capsule 100 mg  100 mg Oral Daily Karmen Bongo, MD   100 mg at 06/10/18 2047  . senna-docusate (Senokot-S) tablet 1 tablet  1 tablet Oral Daily Karmen Bongo, MD   1 tablet at 06/10/18 5053     Discharge Medications: Please see discharge summary for a list of discharge medications.  Relevant Imaging Results:  Relevant Lab Results:   Additional Information SS#: [place]  [place], LCSW

## 2018-06-11 NOTE — Progress Notes (Signed)
RN changed pt's linens and gown. Pt readjusted in bed.

## 2018-06-11 NOTE — Care Management (Signed)
Case manager received call from [place] with [employer] that they do not have contract with [employer], patient will be billed if we want [employer] as vendor. Case manager has contacted Dan with [employer] for wound vac.

## 2018-06-11 NOTE — Care Management (Signed)
Case manager faxed order and pertinent information to Pollyann Glen, [facility] Liaison for wound Vac. Faxed [phone]. Patient will go to [facility] will follow. Case manager informed [facility] agency that patient is not to have dressing changes to [facility], will be done in MD office.

## 2018-06-11 NOTE — Progress Notes (Signed)
Inpatient Diabetes Program Recommendations  AACE/ADA: New Consensus Statement on Inpatient Glycemic Control (2015)  Target Ranges:  Prepandial:   less than 140 mg/dL      Peak postprandial:   less than 180 mg/dL (1-2 hours)      Critically ill patients:  140 - 180 mg/dL   Lab Results  Component Value Date   GLUCAP 185 (H) 06/10/2018   HGBA1C 7.5 (H) 06/09/2018    Review of Glycemic ControlResults for CORABELLE, SPACKMAN (MRN [id number]) as of 06/11/2018 10:43  Ref. Range 06/10/2018 11:46 06/10/2018 16:42 06/10/2018 17:21 06/10/2018 21:03  Glucose-Capillary Latest Ref Range: 65 - 99 mg/dL 201 (H) 68 110 (H) 185 (H)   Diabetes history: Type 2 DM  Outpatient Diabetes medications: Lantus 7 units q AM, Novolog 6-11 tid with meals Current orders for Inpatient glycemic control:  Novolog moderate tid with meals Lantus 7 units daily  Inpatient Diabetes Program Recommendations:   Please reduce Novolog correction to sensitive tid with meals.   Thanks,  Adah Perl, RN, BC-ADM Inpatient Diabetes Coordinator Pager [phone] (8a-5p)

## 2018-06-11 NOTE — Social Work (Addendum)
CSW called son, Ulice Dash, [phone], to discuss disposition. Son indicated that he was advised that patient would be in hospital for at least a week. CSW validated and indicated that this discussion is to start the discharge discussion. CSW provided SNF offers and son will review. CSW discussed insurance Auth needed for SNF placement. Son confirmed that patient has been to [facility] in the past.  CSW will f/u for SNF selection.  CSW then returned a call to [facility] as they called to discuss disposition. CSW discussed with Clarene Critchley [phone] at ALF and she indicated that they can take patient back and provide rehab.    CSW then called son and left message to discuss ALF vs. SNF.    CSW will f/u.  Elissa Hefty, LCSW Clinical Social Worker [phone]

## 2018-06-11 NOTE — Progress Notes (Signed)
Orthopedic Trauma Service Progress Note   Patient ID: [name] MRN: [id number] DOB/AGE: April 01, 1915 84 y.o.  Subjective:  Ortho issues stable Pt sleeping and comfortable on floor on rounds, did not disturb   H/H drifting down, not surprising given open fracture of her R tibia   Active order for 1 unit PRBCs   ROS Pt sleeping   Objective:   VITALS:   Vitals:   06/10/18 0427 06/10/18 2100 06/11/18 0617 06/11/18 1040  BP: (!) 140/53 (!) 135/53 130/68 (!) 149/60  Pulse: (!) 55 62 (!) 52 62  Resp:      Temp: (!) 97.4 F (36.3 C) 98.1 F (36.7 C) 98.2 F (36.8 C)   TempSrc: Oral Oral    SpO2: 96% 96% 96%   Weight:      Height:        Estimated body mass index is 23.72 kg/m as calculated from the following:   Height as of this encounter: 5\' 7"  (1.702 m).   Weight as of this encounter: 68.7 kg (151 lb 7.3 oz).   Intake/Output      06/12 0701 - 06/13 0700 06/13 0701 - 06/14 0700   P.O. 120    I.V. (mL/kg) 526.5 (7.7)    IV Piggyback 476.7    Total Intake(mL/kg) 1123.2 (16.3)    Urine (mL/kg/hr) 1155 (0.7)    Drains 50    Blood     Total Output 1205    Net -81.8           LABS  Results for orders placed or performed during the hospital encounter of 06/09/18 (from the past 24 hour(s))  Glucose, capillary     Status: Abnormal   Collection Time: 06/10/18 11:46 AM  Result Value Ref Range   Glucose-Capillary 201 (H) 65 - 99 mg/dL  Basic metabolic panel     Status: Abnormal   Collection Time: 06/10/18  4:20 PM  Result Value Ref Range   Sodium 137 135 - 145 mmol/L   Potassium 3.8 3.5 - 5.1 mmol/L   Chloride 105 101 - 111 mmol/L   CO2 21 (L) 22 - 32 mmol/L   Glucose, Bld 56 (L) 65 - 99 mg/dL   BUN 65 (H) 6 - 20 mg/dL   Creatinine, Ser 2.93 (H) 0.44 - 1.00 mg/dL   Calcium 9.6 8.9 - 10.3 mg/dL   GFR calc non Af Amer 12 (L) >60 mL/min   GFR calc Af Amer 14 (L) >60 mL/min   Anion gap 11 5 - 15  CBC     Status: Abnormal   Collection Time: 06/10/18  4:20 PM  Result Value Ref Range   WBC 14.5 (H) 4.0 - 10.5 K/uL   RBC 2.39 (L) 3.87 - 5.11 MIL/uL   Hemoglobin 7.8 (L) 12.0 - 15.0 g/dL   HCT 24.3 (L) 36.0 - 46.0 %   MCV 101.7 (H) 78.0 - 100.0 fL   MCH 32.6 26.0 - 34.0 pg   MCHC 32.1 30.0 - 36.0 g/dL   RDW 12.9 11.5 - 15.5 %   Platelets 120 (L) 150 - 400 K/uL  Magnesium     Status: None   Collection Time: 06/10/18  4:20 PM  Result Value Ref Range   Magnesium 1.8 1.7 - 2.4 mg/dL  HIV antibody (Routine Screening)     Status: None   Collection Time: 06/10/18  4:20 PM  Result Value Ref Range   HIV Screen 4th Generation wRfx Non Reactive Non Reactive  Glucose, capillary  Status: None   Collection Time: 06/10/18  4:42 PM  Result Value Ref Range   Glucose-Capillary 68 65 - 99 mg/dL  Glucose, capillary     Status: Abnormal   Collection Time: 06/10/18  5:21 PM  Result Value Ref Range   Glucose-Capillary 110 (H) 65 - 99 mg/dL  Glucose, capillary     Status: Abnormal   Collection Time: 06/10/18  9:03 PM  Result Value Ref Range   Glucose-Capillary 185 (H) 65 - 99 mg/dL  Basic metabolic panel     Status: Abnormal   Collection Time: 06/11/18  5:57 AM  Result Value Ref Range   Sodium 138 135 - 145 mmol/L   Potassium 4.9 3.5 - 5.1 mmol/L   Chloride 106 101 - 111 mmol/L   CO2 23 22 - 32 mmol/L   Glucose, Bld 170 (H) 65 - 99 mg/dL   BUN 64 (H) 6 - 20 mg/dL   Creatinine, Ser 3.00 (H) 0.44 - 1.00 mg/dL   Calcium 9.7 8.9 - 10.3 mg/dL   GFR calc non Af Amer 12 (L) >60 mL/min   GFR calc Af Amer 14 (L) >60 mL/min   Anion gap 9 5 - 15  CBC     Status: Abnormal   Collection Time: 06/11/18  5:57 AM  Result Value Ref Range   WBC 10.2 4.0 - 10.5 K/uL   RBC 2.28 (L) 3.87 - 5.11 MIL/uL   Hemoglobin 7.3 (L) 12.0 - 15.0 g/dL   HCT 23.0 (L) 36.0 - 46.0 %   MCV 100.9 (H) 78.0 - 100.0 fL   MCH 32.0 26.0 - 34.0 pg   MCHC 31.7 30.0 - 36.0 g/dL   RDW 13.1 11.5 - 15.5 %   Platelets 107 (L) 150 - 400 K/uL  Prepare RBC      Status: None   Collection Time: 06/11/18  9:12 AM  Result Value Ref Range   Order Confirmation      ORDER PROCESSED BY BLOOD BANK Performed at [facility], [address] [place], [place] [address]   Glucose, capillary     Status: Abnormal   Collection Time: 06/11/18 11:02 AM  Result Value Ref Range   Glucose-Capillary 128 (H) 65 - 99 mg/dL     PHYSICAL EXAM:   Gen: sleeping, comfortable appearing  Ext:       Right Lower Extremity   Ext warm   Dressing c/d/i  VAC functioning well, 50 mmHg  Swelling stable   + DP pulse   Assessment/Plan: 2 Days Post-Op   Principal Problem:   Tibia and fibula open fracture, right Active Problems:   Hypothyroidism   Chronic kidney disease, stage III (moderate) (HCC)   Controlled diabetes mellitus with diabetic neuropathy, with long-term current use of insulin (HCC)   Unspecified atrial fibrillation (HCC)   Essential hypertension   HCAP (healthcare-associated pneumonia)   Acute lower UTI   Postoperative anemia   Anti-infectives (From admission, onward)   Start     Dose/Rate Route Frequency Ordered Stop   06/11/18 1330  ceFEPIme (MAXIPIME) 1 g in sodium chloride 0.9 % 100 mL IVPB     1 g 200 mL/hr over 30 Minutes Intravenous Every 24 hours 06/11/18 0915     06/11/18 1100  vancomycin (VANCOCIN) IVPB 1000 mg/200 mL premix  Status:  Discontinued     1,000 mg 200 mL/hr over 60 Minutes Intravenous Every 48 hours 06/09/18 0954 06/09/18 1842   06/11/18 1000  vancomycin (VANCOCIN) IVPB 1000 mg/200  mL premix  Status:  Discontinued     1,000 mg 200 mL/hr over 60 Minutes Intravenous Every 48 hours 06/10/18 0945 06/10/18 2119   06/11/18 1000  amoxicillin-clavulanate (AUGMENTIN) 875-125 MG per tablet 1 tablet  Status:  Discontinued     1 tablet Oral Every 12 hours 06/11/18 0912 06/11/18 0915   06/10/18 1000  ceFAZolin (ANCEF) IVPB 1 g/50 mL premix  Status:  Discontinued     1 g 100 mL/hr over 30 Minutes Intravenous Every 24 hours  06/09/18 1905 06/10/18 0937   06/10/18 1000  ceFEPIme (MAXIPIME) 1 g in sodium chloride 0.9 % 100 mL IVPB  Status:  Discontinued     1 g 200 mL/hr over 30 Minutes Intravenous Every 24 hours 06/10/18 0907 06/11/18 0912   06/09/18 1234  vancomycin (VANCOCIN) powder  Status:  Discontinued       As needed 06/09/18 1234 06/09/18 1348   06/09/18 1233  tobramycin (NEBCIN) injection  Status:  Discontinued       As needed 06/09/18 1233 06/09/18 1348   06/09/18 1015  ceFAZolin (ANCEF) IVPB 2g/100 mL premix     2 g 200 mL/hr over 30 Minutes Intravenous On call to O.R. 06/09/18 1011 06/09/18 1143   06/09/18 1000  vancomycin (VANCOCIN) IVPB 1000 mg/200 mL premix     1,000 mg 200 mL/hr over 60 Minutes Intravenous  Once 06/09/18 0949 06/09/18 1134   06/09/18 0945  ceFEPIme (MAXIPIME) 2 g in sodium chloride 0.9 % 100 mL IVPB  Status:  Discontinued     2 g 200 mL/hr over 30 Minutes Intravenous  Once 06/09/18 0939 06/09/18 1842    .  POD/HD#: 2  84 y/o female s/p fall with grade 3 b open R tibia and fibula fracture    -fall   - Grade 3B open R tibia and fibula fracture s/p IMN             NWB R leg             Wound vac on complex wound                         Due to age and associated comorbid conditions pt is not a candidate for flap coverage                         Will change vac on  6/19 at office                                      discussed with CM about home VAC unit    Paperwork processing    If pt still in hospital next week we can remove at bedside, would still keep home vac order active in the event we need to continue with VAC until closure completed     Pt at high risk for infection/amputation                PT/OT evals             Night splint on at all times, please remove periodically to evaluate posterior soft tissue             Knee ROM as tolerated    - Pain management:             Tylenol  Minimize narcotics so as not to exacerbate confusion    - ABL  anemia/Hemodynamics             PRBCs ordered   Cbc in am    - Medical issues              Per primary   actue renal failure                DM                         Tight sugar control to minimize complications                          Goal is for cbgs to be <200 mg/dL   - DVT/PE prophylaxis:             ASA 325 daily    - ID:              Ancef x 72 hours    - Metabolic Bone Disease:             + osteoporosis   - Activity:             Up with assistance   - FEN/GI prophylaxis/Foley/Lines:             Carb mod diet    - Impediments to fracture healing:             Open fracture              DM               - Dispo:             continue with inpatient care   Jari Pigg, PA-C [facility] [phone] (O) [phone] (C) 06/11/2018, 11:10 AM

## 2018-06-12 ENCOUNTER — Inpatient Hospital Stay (HOSPITAL_COMMUNITY): Payer: Medicare HMO

## 2018-06-12 DIAGNOSIS — G92 Toxic encephalopathy: Secondary | ICD-10-CM

## 2018-06-12 DIAGNOSIS — R4701 Aphasia: Secondary | ICD-10-CM | POA: Diagnosis not present

## 2018-06-12 DIAGNOSIS — D649 Anemia, unspecified: Secondary | ICD-10-CM

## 2018-06-12 DIAGNOSIS — E039 Hypothyroidism, unspecified: Secondary | ICD-10-CM

## 2018-06-12 DIAGNOSIS — S82201R Unspecified fracture of shaft of right tibia, subsequent encounter for open fracture type IIIA, IIIB, or IIIC with malunion: Secondary | ICD-10-CM

## 2018-06-12 DIAGNOSIS — S82401R Unspecified fracture of shaft of right fibula, subsequent encounter for open fracture type IIIA, IIIB, or IIIC with malunion: Secondary | ICD-10-CM

## 2018-06-12 LAB — BASIC METABOLIC PANEL
ANION GAP: 9 (ref 5–15)
BUN: 64 mg/dL — ABNORMAL HIGH (ref 6–20)
CHLORIDE: 107 mmol/L (ref 101–111)
CO2: 20 mmol/L — AB (ref 22–32)
Calcium: 10.2 mg/dL (ref 8.9–10.3)
Creatinine, Ser: 2.96 mg/dL — ABNORMAL HIGH (ref 0.44–1.00)
GFR calc non Af Amer: 12 mL/min — ABNORMAL LOW (ref >60)
GFR, EST AFRICAN AMERICAN: 14 mL/min — AB (ref >60)
Glucose, Bld: 156 mg/dL — ABNORMAL HIGH (ref 65–99)
POTASSIUM: 4.7 mmol/L (ref 3.5–5.1)
SODIUM: 136 mmol/L (ref 135–145)

## 2018-06-12 LAB — CBC
HEMATOCRIT: 28.8 % — AB (ref 36.0–46.0)
HEMOGLOBIN: 9.3 g/dL — AB (ref 12.0–15.0)
MCH: 31.7 pg (ref 26.0–34.0)
MCHC: 32.3 g/dL (ref 30.0–36.0)
MCV: 98.3 fL (ref 78.0–100.0)
Platelets: 100 10*3/uL — ABNORMAL LOW (ref 150–400)
RBC: 2.93 MIL/uL — AB (ref 3.87–5.11)
RDW: 14.7 % (ref 11.5–15.5)
WBC: 12.1 10*3/uL — AB (ref 4.0–10.5)

## 2018-06-12 LAB — GLUCOSE, CAPILLARY
GLUCOSE-CAPILLARY: 157 mg/dL — AB (ref 65–99)
GLUCOSE-CAPILLARY: 123 mg/dL — AB (ref 65–99)
GLUCOSE-CAPILLARY: 165 mg/dL — AB (ref 65–99)
Glucose-Capillary: 140 mg/dL — ABNORMAL HIGH (ref 65–99)
Glucose-Capillary: 239 mg/dL — ABNORMAL HIGH (ref 65–99)

## 2018-06-12 LAB — MAGNESIUM: MAGNESIUM: 2 mg/dL (ref 1.7–2.4)

## 2018-06-12 MED ORDER — ACETAMINOPHEN 500 MG PO TABS
500.0000 mg | ORAL_TABLET | Freq: Three times a day (TID) | ORAL | Status: DC
  Administered 2018-06-12 – 2018-06-17 (×8): 500 mg via ORAL
  Filled 2018-06-12 (×9): qty 1

## 2018-06-12 MED ORDER — ENSURE ENLIVE PO LIQD
237.0000 mL | ORAL | Status: DC
  Administered 2018-06-12: 237 mL via ORAL

## 2018-06-12 MED ORDER — HYDROCODONE-ACETAMINOPHEN 5-325 MG PO TABS: 1.0000 | ORAL_TABLET | ORAL | Status: DC | PRN

## 2018-06-12 MED ORDER — PRO-STAT SUGAR FREE PO LIQD
30.0000 mL | Freq: Two times a day (BID) | ORAL | Status: DC
  Administered 2018-06-12 – 2018-06-14 (×5): 30 mL via ORAL
  Filled 2018-06-12 (×7): qty 30

## 2018-06-12 MED ORDER — PREGABALIN 25 MG PO CAPS
50.0000 mg | ORAL_CAPSULE | Freq: Every day | ORAL | Status: DC
  Administered 2018-06-12 – 2018-06-14 (×2): 50 mg via ORAL
  Filled 2018-06-12 (×3): qty 2

## 2018-06-12 NOTE — Progress Notes (Signed)
Pr HR noted to be in fib/flutter per telemetry, with HR in 50's, dropping briefly to unsustained rate of 37, and 2.22sec. pause. On call provider notified, will hold HS metoprolol and monitor patient.

## 2018-06-12 NOTE — Progress Notes (Addendum)
Orthopedic Trauma Service Progress Note   Patient ID: [name] MRN: [id number] DOB/AGE: Nov 08, 1915 84 y.o.  Subjective:  Morning events noted Pt sleeping on rounds this am    ROS Unable to obtain   Objective:   VITALS:   Vitals:   06/12/18 0805 06/12/18 0810 06/12/18 0830 06/12/18 0833  BP: (!) 202/58 (!) 190/61 (!) 190/60 (!) 198/58  Pulse:  (!) 54    Resp:      Temp:  98.3 F (36.8 C)    TempSrc:      SpO2:      Weight:      Height:        Estimated body mass index is 22.86 kg/m as calculated from the following:   Height as of this encounter: 5\' 7"  (1.702 m).   Weight as of this encounter: 66.2 kg (145 lb 15.1 oz).   Intake/Output      06/13 0701 - 06/14 0700 06/14 0701 - 06/15 0700   P.O. 150 240   I.V. (mL/kg)     Blood 306    Other 50    IV Piggyback 53.3    Total Intake(mL/kg) 559.3 (8.4) 240 (3.6)   Urine (mL/kg/hr) 500 (0.3)    Drains     Total Output 500    Net +59.3 +240          LABS  Results for orders placed or performed during the hospital encounter of 06/09/18 (from the past 24 hour(s))  Glucose, capillary     Status: Abnormal   Collection Time: 06/11/18 11:02 AM  Result Value Ref Range   Glucose-Capillary 128 (H) 65 - 99 mg/dL  Glucose, capillary     Status: None   Collection Time: 06/11/18  5:05 PM  Result Value Ref Range   Glucose-Capillary 88 65 - 99 mg/dL  Hemoglobin and hematocrit, blood     Status: Abnormal   Collection Time: 06/11/18  7:16 PM  Result Value Ref Range   Hemoglobin 8.7 (L) 12.0 - 15.0 g/dL   HCT 27.1 (L) 36.0 - 53.2 %  Basic metabolic panel     Status: Abnormal   Collection Time: 06/12/18  4:53 AM  Result Value Ref Range   Sodium 136 135 - 145 mmol/L   Potassium 4.7 3.5 - 5.1 mmol/L   Chloride 107 101 - 111 mmol/L   CO2 20 (L) 22 - 32 mmol/L   Glucose, Bld 156 (H) 65 - 99 mg/dL   BUN 64 (H) 6 - 20 mg/dL   Creatinine, Ser 2.96 (H) 0.44 - 1.00 mg/dL   Calcium 10.2 8.9 -  10.3 mg/dL   GFR calc non Af Amer 12 (L) >60 mL/min   GFR calc Af Amer 14 (L) >60 mL/min   Anion gap 9 5 - 15  CBC     Status: Abnormal   Collection Time: 06/12/18  4:53 AM  Result Value Ref Range   WBC 12.1 (H) 4.0 - 10.5 K/uL   RBC 2.93 (L) 3.87 - 5.11 MIL/uL   Hemoglobin 9.3 (L) 12.0 - 15.0 g/dL   HCT 28.8 (L) 36.0 - 46.0 %   MCV 98.3 78.0 - 100.0 fL   MCH 31.7 26.0 - 34.0 pg   MCHC 32.3 30.0 - 36.0 g/dL   RDW 14.7 11.5 - 15.5 %   Platelets 100 (L) 150 - 400 K/uL  Magnesium     Status: None   Collection Time: 06/12/18  4:53 AM  Result Value Ref Range  Magnesium 2.0 1.7 - 2.4 mg/dL  Glucose, capillary     Status: Abnormal   Collection Time: 06/12/18  6:57 AM  Result Value Ref Range   Glucose-Capillary 157 (H) 65 - 99 mg/dL  Glucose, capillary     Status: Abnormal   Collection Time: 06/12/18  8:19 AM  Result Value Ref Range   Glucose-Capillary 140 (H) 65 - 99 mg/dL     PHYSICAL EXAM:  Gen: appears comfortable, NAD Lungs: some accessory muscle usage, breathing seams somewhat shallow, suprasternal retractions  Ext:       Right Lower extremity   Dressing stable  Vac functioning  Ext warm  Swelling controlled  I removed pt from night splint and placed yellow foam under ankle to float heel  Manipulation of her leg did not cause response   Assessment/Plan: 3 Days Post-Op   Principal Problem:   Tibia and fibula open fracture, right Active Problems:   Hypothyroidism   Chronic kidney disease, stage III (moderate) (HCC)   Controlled diabetes mellitus with diabetic neuropathy, with long-term current use of insulin (HCC)   Unspecified atrial fibrillation (HCC)   Essential hypertension   HCAP (healthcare-associated pneumonia)   Acute lower UTI   Postoperative anemia   Aphasia   Anti-infectives (From admission, onward)   Start     Dose/Rate Route Frequency Ordered Stop   06/11/18 1330  ceFEPIme (MAXIPIME) 1 g in sodium chloride 0.9 % 100 mL IVPB     1 g 200 mL/hr  over 30 Minutes Intravenous Every 24 hours 06/11/18 0915     06/11/18 1100  vancomycin (VANCOCIN) IVPB 1000 mg/200 mL premix  Status:  Discontinued     1,000 mg 200 mL/hr over 60 Minutes Intravenous Every 48 hours 06/09/18 0954 06/09/18 1842   06/11/18 1000  vancomycin (VANCOCIN) IVPB 1000 mg/200 mL premix  Status:  Discontinued     1,000 mg 200 mL/hr over 60 Minutes Intravenous Every 48 hours 06/10/18 0945 06/10/18 2119   06/11/18 1000  amoxicillin-clavulanate (AUGMENTIN) 875-125 MG per tablet 1 tablet  Status:  Discontinued     1 tablet Oral Every 12 hours 06/11/18 0912 06/11/18 0915   06/10/18 1000  ceFAZolin (ANCEF) IVPB 1 g/50 mL premix  Status:  Discontinued     1 g 100 mL/hr over 30 Minutes Intravenous Every 24 hours 06/09/18 1905 06/10/18 0937   06/10/18 1000  ceFEPIme (MAXIPIME) 1 g in sodium chloride 0.9 % 100 mL IVPB  Status:  Discontinued     1 g 200 mL/hr over 30 Minutes Intravenous Every 24 hours 06/10/18 0907 06/11/18 0912   06/09/18 1234  vancomycin (VANCOCIN) powder  Status:  Discontinued       As needed 06/09/18 1234 06/09/18 1348   06/09/18 1233  tobramycin (NEBCIN) injection  Status:  Discontinued       As needed 06/09/18 1233 06/09/18 1348   06/09/18 1015  ceFAZolin (ANCEF) IVPB 2g/100 mL premix     2 g 200 mL/hr over 30 Minutes Intravenous On call to O.R. 06/09/18 1011 06/09/18 1143   06/09/18 1000  vancomycin (VANCOCIN) IVPB 1000 mg/200 mL premix     1,000 mg 200 mL/hr over 60 Minutes Intravenous  Once 06/09/18 0949 06/09/18 1134   06/09/18 0945  ceFEPIme (MAXIPIME) 2 g in sodium chloride 0.9 % 100 mL IVPB  Status:  Discontinued     2 g 200 mL/hr over 30 Minutes Intravenous  Once 06/09/18 0939 06/09/18 1842    .  POD/HD#: 3  84 y/o female s/p fall with grade 3 b open R tibia and fibula fracture    -fall   - Grade 3B open R tibia and fibula fracture s/p IMN             NWB R leg             Wound vac on complex wound                         Due to age  and associated comorbid conditions pt is not a candidate for flap coverage                         Will change vac on  6/19 at office                                      discussed with CM about home VAC unit                                     Paperwork processing                                     If pt still in hospital next week we can remove at bedside, would still keep home vac order active in the event we need to continue with VAC until closure completed                                      Pt at high risk for infection/amputation                PT/OT evals             cycle night splint on and off every 2 hours. When not on float heel off bed              Knee ROM as tolerated    - Pain management:             Tylenol             Minimize narcotics so as not to exacerbate confusion    - ABL anemia/Hemodynamics             cbc stable    - Medical issues              Per primary                         actue renal failure                DM                         Tight sugar control to minimize complications                          Goal is for cbgs to be <200 mg/dL   - DVT/PE prophylaxis:             ASA 325 daily    -  ID:              Ancef x 72 hours    - Metabolic Bone Disease:             + osteoporosis   - Activity:             Up with assistance   - FEN/GI prophylaxis/Foley/Lines:             Carb mod diet    - Impediments to fracture healing:             Open fracture              DM    - Dispo:             continue with inpatient care    ? Palliative care consult    Jari Pigg, PA-C [facility] [phone] [name] (C) 06/12/2018, 10:40 AM

## 2018-06-12 NOTE — Progress Notes (Signed)
Was called by RN that patient had a left-sided facial droop and left arm weakness.  Rapid response was called.  Came and assessed patient.  Patient with no asymmetric weakness.  Left upper extremity strength 4/5 bilaterally.  Patient now no longer aphasic and following commands answering questions appropriately and speaking with the son at bedside.  CT head which was done was negative.  Will follow for now.  No charge.

## 2018-06-12 NOTE — Care Management Important Message (Signed)
Important Message  Patient Details  Name: [name] MRN: [id number] Date of Birth: Sep 22, 1915   Medicare Important Message Given:  Yes    Lucila Klecka Montine Circle 06/12/2018, 1:08 PM

## 2018-06-12 NOTE — Evaluation (Signed)
Clinical/Bedside Swallow Evaluation Patient Details  Name: [name] MRN: [id number] Date of Birth: 18-Aug-1915  Today's Date: 06/12/2018 Time: SLP Start Time (ACUTE ONLY): 1315 SLP Stop Time (ACUTE ONLY): 1342 SLP Time Calculation (min) (ACUTE ONLY): 27 min  Past Medical History:  Past Medical History:  Diagnosis Date  . Chronic kidney disease (CKD), stage III (moderate) ([place])    [name] 12/06/2015  . Fibula fracture 06/09/2018   OPEN RIGHT   . History of blood transfusion    "when my son was born; maybe once since" (04/30/2016)  . Hypertension   . Hypothyroidism   . Pneumonia 1930s; ? 11/2015  . Tibia fracture 06/09/2018  . Tuberculosis 1930s  . Type II diabetes mellitus ([place]) since 1964   Past Surgical History:  Past Surgical History:  Procedure Laterality Date  . CATARACT EXTRACTION W/ INTRAOCULAR LENS  IMPLANT, BILATERAL Bilateral   . ESOPHAGOGASTRODUODENOSCOPY N/A 03/03/2015   Procedure: ESOPHAGOGASTRODUODENOSCOPY (EGD);  Surgeon: Inda Castle, MD;  Location: [facility];  Service: Endoscopy;  Laterality: N/A;  . ESOPHAGOGASTRODUODENOSCOPY N/A 03/07/2015   Procedure: ESOPHAGOGASTRODUODENOSCOPY (EGD);  Surgeon: Jerene Bears, MD;  Location[facility] ENDOSCOPY;  Service: Endoscopy;  Laterality: N/A;  . FRACTURE SURGERY    . HIP FRACTURE SURGERY Bilateral   . I&D EXTREMITY Right 06/09/2018   Procedure: IRRIGATION AND DEBRIDEMENT EXTREMITY;  Surgeon: Altamese Guthrie, MD;  Location: [facility];  Service: Orthopedics;  Laterality: Right;  . IM NAILING TIBIA Right 06/09/2018  . TIBIA IM NAIL INSERTION Right 06/09/2018   Procedure: INTRAMEDULLARY (IM) NAIL TIBIAL;  Surgeon: Altamese [place], MD;  Location: [facility];  Service: Orthopedics;  Laterality: Right;  . TONSILLECTOMY     HPI:  [name] is a 84 y.o. female with medical history significant of DM; hypothyroidism; HTN; and CKD stage 3 presenting with a fall at SNF.  She thinks she got up and had a mechanical fall.  Her leg was clearly  broken with an open and very dislocated fracture.   She then had an episode of new onset left sided facial droop and left arm weakness.  CT head is showing no evidence of acute abnormality.  Most recent CXR is showing persistant atelectasis/infiltrate in the RUL and LLL as well as small pleural effusions which are slightly increased from prior exam.     Assessment / Plan / Recommendation Clinical Impression  Clinical swallowing evaluation completed using thin liquids, pureed material and dry solids.  Nursing reporting no obvious issues with intake.  Patient was noted to have left sided facial droop which seems to have resolved.  Nursing did report fixed stare this AM with concern for possible seizures.  Oral mechanism exam was unremarkable except for mild generalized weakness.  The patient presented with an essentially functional oral and pharyngeal swallow.  Swallow trigger appeared to be timely and hyo-laryngeal excursion was appreciated to palpation.  Mastication of dry solids appeared to be mildly slow but no oral residue noted.  Patient with cough x2 given thin liquids after dry solids.  However, it was not seen consistently.  Recommend continue with regular diet with thin liquids.  ST will follow for therapeutic diet tolerance.  MD please consider MBS if patient has a decline in respiratory status.  Cognitive/linguistic evaluation was deferred as patient is from a SNF and per nurse all stroke like symptoms have resolved and CT head showed no acute intracranial findings.  Consider follow up at SNF for cognition when patient is back in her normal environment  and more information regarding baseline is available.   SLP Visit Diagnosis: Dysphagia, unspecified (R13.10)    Aspiration Risk  Mild aspiration risk    Diet Recommendation   Regular with thin liquids  Medication Administration: Whole meds with liquid    Other  Recommendations Oral Care Recommendations: Oral care BID   Follow up  Recommendations Other (comment)(TBD)      Frequency and Duration min 2x/week  2 weeks       Prognosis        Swallow Study   General Date of Onset: 06/09/18 HPI: [name] is a 84 y.o. female with medical history significant of DM; hypothyroidism; HTN; and CKD stage 3 presenting with a fall at SNF.  She thinks she got up and had a mechanical fall.  Her leg was clearly broken with an open and very dislocated fracture.   She then had an episode of new onset left sided facial droop and left arm weakness.  CT head is showing no evidence of acute abnormality.  Most recent CXR is showing persistant atelectasis/infiltrate in the RUL and LLL as well as small pleural effusions which are slightly increased from prior exam.   Type of Study: Bedside Swallow Evaluation Previous Swallow Assessment: None noted at [facility]. Diet Prior to this Study: Regular;Thin liquids Temperature Spikes Noted: No Respiratory Status: Room air History of Recent Intubation: No Behavior/Cognition: Alert;Cooperative;Confused Oral Cavity Assessment: Within Functional Limits Oral Care Completed by SLP: No Oral Cavity - Dentition: Adequate natural dentition Vision: Functional for self-feeding Self-Feeding Abilities: Able to feed self Patient Positioning: Upright in bed Baseline Vocal Quality: Normal Volitional Cough: Strong Volitional Swallow: Able to elicit    Oral/Motor/Sensory Function Overall Oral Motor/Sensory Function: Within functional limits   Ice Chips Ice chips: Not tested   Thin Liquid Thin Liquid: Impaired Presentation: Spoon;Cup;Straw;Self Fed Pharyngeal  Phase Impairments: Cough - Delayed(intermittent)    Nectar Thick Nectar Thick Liquid: Not tested   Honey Thick Honey Thick Liquid: Not tested   Puree Puree: Within functional limits Presentation: Spoon   Solid   GO   Solid: Within functional limits Presentation: [place] [place]  SLP [phone]  Lamar Sprinkles 06/12/2018,2:00 PM

## 2018-06-12 NOTE — Progress Notes (Signed)
RN noticed Left sided facial droop and left sided arm weakness, paged rapid response she told me to call neuro. Neuro paged, MD notifed

## 2018-06-12 NOTE — Consult Note (Addendum)
Neurology Consultation  Reason for Consult: Acute code stroke-acute onset difficulty speaking Referring Physician: Dr. Grandville Silos  CC: Sudden onset difficulty speaking  History is obtained from: Rapid response team, chart, patient's grandson at bedside.  HPI: [name] is a 84 y.o. female who has a past medical history of chronic kidney disease, hypertension, hypothyroidism and diabetes was admitted to the hospital for a right leg fracture secondary to fall that her nursing facility, was in her usual state of health till about 8 AM today when the staff noticed that she is not responding to questions and not talking.  She did not verbalize, respond to any questions or follow any commands.  Rapid response was called.  Initial evaluation by the RRT was concerning for a aphasia, no facial droop, symmetric weakness in both arms.  I was called over the phone and given the symptoms.  I requested a code stroke be activated and the patient be brought to CT stat for noncontrast head CT. I examined the patient in the CT scanner myself.  She seemed to be in no acute distress.  Did not respond to my greeting.  But when asked what her name was, she said Jaimya, she did not follow commands to raise her arms but when I raise her right arm, she was able to hold it up against gravity for 10 seconds but her left arm she forcibly pushed down and did not keep up for 10 seconds. Upon assessing her sensory symptoms, as I was trying to see if she can move her nonfractured leg, she actively withdrew her leg and attempted to kick me with full force.  When I pinched the inner thigh on her fractured leg, she again very briskly attempted to kick me with her left leg. When the nurses and the CT technicians were attempting to obtain a CT scanner and situated on the CT table, she very clearly verbalized-ouch, you are hurting me.  She also very clearly mouthed an impolite word without any dysarthria. CBG 140s   LKW: 8 AM on  06/12/2018 tpa given?: no, nonfocal symptoms, recent fracture with downtrending hemoglobin requiring blood transfusions Premorbid modified Rankin scale (mRS): 3  WEX:HBZJIR to obtain due to altered mental status.   Past Medical History:  Diagnosis Date  . Chronic kidney disease (CKD), stage III (moderate) ([place])    [name] 12/06/2015  . Fibula fracture 06/09/2018   OPEN RIGHT   . History of blood transfusion    "when my son was born; maybe once since" (04/30/2016)  . Hypertension   . Hypothyroidism   . Pneumonia 1930s; ? 11/2015  . Tibia fracture 06/09/2018  . Tuberculosis 1930s  . Type II diabetes mellitus ([place]) since 1964    Family History  Problem Relation Age of Onset  . Diabetes Father    Social History:   reports that she has never smoked. She has never used smokeless tobacco. She reports that she drinks alcohol. She reports that she does not use drugs.  Medications  Current Facility-Administered Medications:  .  acetaminophen (TYLENOL) tablet 650 mg, 650 mg, Oral, Q6H PRN **OR** acetaminophen (TYLENOL) suppository 650 mg, 650 mg, Rectal, Q6H PRN, Karmen Bongo, MD .  acetaminophen (TYLENOL) tablet 500 mg, 500 mg, Oral, Q12H, Ainsley Spinner, PA-C, 500 mg at 06/11/18 2017 .  acetaminophen (TYLENOL) tablet 650 mg, 650 mg, Oral, Once, Eugenie Filler, MD .  albuterol (PROVENTIL) (2.5 MG/3ML) 0.083% nebulizer solution 2.5 mg, 2.5 mg, Inhalation, Q4H PRN, Karmen Bongo, MD .  amLODipine (NORVASC) tablet 2.5 mg, 2.5 mg, Oral, Daily, Karmen Bongo, MD, 2.5 mg at 06/11/18 1035 .  aspirin EC tablet 325 mg, 325 mg, Oral, Daily, Ainsley Spinner, PA-C, 325 mg at 06/11/18 1035 .  ceFEPIme (MAXIPIME) 1 g in sodium chloride 0.9 % 100 mL IVPB, 1 g, Intravenous, Q24H, Eugenie Filler, MD, Stopped at 06/11/18 1528 .  chlorhexidine (HIBICLENS) 4 % liquid 4 application, 60 mL, Topical, Once, Karmen Bongo, MD .  docusate sodium (COLACE) capsule 100 mg, 100 mg, Oral, BID, Karmen Bongo,  MD, 100 mg at 06/11/18 2322 .  enoxaparin (LOVENOX) injection 30 mg, 30 mg, Subcutaneous, Q24H, Karmen Bongo, MD, 30 mg at 06/11/18 1034 .  ferrous sulfate tablet 325 mg, 325 mg, Oral, Q breakfast, Karmen Bongo, MD, 325 mg at 06/11/18 1034 .  HYDROcodone-acetaminophen (NORCO/VICODIN) 5-325 MG per tablet 1-2 tablet, 1-2 tablet, Oral, Q4H PRN, Ainsley Spinner, PA-C .  insulin aspart (novoLOG) injection 0-9 Units, 0-9 Units, Subcutaneous, TID WC, Eugenie Filler, MD, 2 Units at 06/12/18 [phone] .  insulin glargine (LANTUS) injection 7 Units, 7 Units, Subcutaneous, Daily, Karmen Bongo, MD, 7 Units at 06/11/18 1203 .  ipratropium (ATROVENT) 0.06 % nasal spray 2 spray, 2 spray, Each Nare, TID Meliton Rattan, MD, 2 spray at 06/10/18 0933 .  levothyroxine (SYNTHROID, LEVOTHROID) tablet 88 mcg, 88 mcg, Oral, QAC breakfast, Eugenie Filler, MD, 88 mcg at 06/11/18 1034 .  metoCLOPramide (REGLAN) tablet 5-10 mg, 5-10 mg, Oral, Q8H PRN **OR** metoCLOPramide (REGLAN) injection 5-10 mg, 5-10 mg, Intravenous, Q8H PRN, Ainsley Spinner, PA-C .  metoprolol tartrate (LOPRESSOR) tablet 25 mg, 25 mg, Oral, BID, Karmen Bongo, MD, 25 mg at 06/11/18 2321 .  montelukast (SINGULAIR) tablet 10 mg, 10 mg, Oral, Daily, Karmen Bongo, MD, 10 mg at 06/11/18 1036 .  morphine 2 MG/ML injection 0.5-1 mg, 0.5-1 mg, Intravenous, Q2H PRN, Ainsley Spinner, PA-C .  ondansetron [facility]) tablet 4 mg, 4 mg, Oral, Q6H PRN **OR** ondansetron (ZOFRAN) injection 4 mg, 4 mg, Intravenous, Q6H PRN, Karmen Bongo, MD .  pantoprazole (PROTONIX) EC tablet 40 mg, 40 mg, Oral, Daily, Karmen Bongo, MD, 40 mg at 06/11/18 1036 .  phenol (CHLORASEPTIC) mouth spray 1 spray, 1 spray, Mouth/Throat, PRN, Eugenie Filler, MD, 1 spray at 06/11/18 0113 .  polyethylene glycol (MIRALAX / GLYCOLAX) packet 17 g, 17 g, Oral, Daily, Karmen Bongo, MD, 17 g at 06/11/18 1034 .  povidone-iodine 10 % swab 2 application, 2 application, Topical, Once,  Karmen Bongo, MD .  pravastatin (PRAVACHOL) tablet 20 mg, 20 mg, Oral, Daily, Karmen Bongo, MD, 20 mg at 06/11/18 1034 .  pregabalin (LYRICA) capsule 100 mg, 100 mg, Oral, Daily, Karmen Bongo, MD, 100 mg at 06/11/18 2017 .  senna-docusate (Senokot-S) tablet 1 tablet, 1 tablet, Oral, Daily, Karmen Bongo, MD, 1 tablet at 06/11/18 1036  Exam: Current vital signs: BP (!) 190/61 (BP Location: Right Arm)   Pulse (!) 54   Temp 98.3 F (36.8 C)   Resp 18   Ht 5\' 7"  (1.702 m)   Wt 66.2 kg (145 lb 15.1 oz)   SpO2 94%   BMI 22.86 kg/m  Vital signs in last 24 hours: Temp:  [97.9 F (36.6 C)-98.6 F (37 C)] 98.3 F (36.8 C) (06/14 0810) Pulse Rate:  [51-70] 54 (06/14 0810) Resp:  [15-18] 18 (06/13 1415) BP: (145-202)/(41-63) 190/61 (06/14 0810) SpO2:  [93 %-98 %] 94 % (06/14 0343) Weight:  [66.2 kg (145 lb 15.1 oz)] 66.2 kg (145  lb 15.1 oz) (06/14 0500) General: Patient is awake alert in no apparent distress HEENT: Normocephalic atraumatic dry mucous memories Lungs: Clear to auscultation Cardiovascular: S1-S2 heard regular rate rhythm Extremities: Right leg in splint, left leg no edema. Neurological exam Patient is awake, alert.  She was able to tell me her name.  She did not tell me what month or date it was.  She could not tell me where she was as she looked at me and did not talk, which looked almost volitional. When she mouthed impolite words and when she expressed her anger on being put on the CT table, she said clear sentences without any dysarthria. Cranial nerves: Pupils equal round reactive light, extraocular movements intact, visual fields full, face is symmetric, auditory acuity appeared to be grossly intact.  She did not open her mouth to let me evaluate her uvula or tongue. Motor exam: Right upper extremity-was able to sustain against gravity for 10 seconds without any drift but did not follow commands to lift it up herself.  Left upper extremity-she was able to keep  it antigravity and then forcefully bring it down against my hand.  Did not sustain antigravity left upper extremity for a full 10 seconds. Right lower extremity was in the splint and I did not attempt to move it much due to her recent fracture.  Left lower extremity- was able to raise against gravity but did not keep it up for 5 seconds and forcibly pushed her down.  Attempted to kick multiple people taking care of her-appeared to have no weakness in that leg. Sensory exam: Intact to noxious them all over. Was unable to assess her coordination due to cooperation. Gait testing was also deferred at this time.  NIHSS 1a Level of Conscious.: 0 1b LOC Questions: 2 1c LOC Commands: 2 2 Best Gaze: 0 3 Visual: 0 4 Facial Palsy: 0 5a Motor Arm - left: 0 5b Motor Arm - Right: 1 6a Motor Leg - Left: 1 6b Motor Leg - Right: un 7 Limb Ataxia: 0 8 Sensory: 0 9 Best Language: 1 10 Dysarthria: 0 11 Extinct. and Inatten.: 0 TOTAL: 7   Labs I have reviewed labs in epic and the results pertinent to this consultation are: CBC    Component Value Date/Time   WBC 12.1 (H) 06/12/2018 0453   RBC 2.93 (L) 06/12/2018 0453   HGB 9.3 (L) 06/12/2018 0453   HCT 28.8 (L) 06/12/2018 0453   PLT 100 (L) 06/12/2018 0453   MCV 98.3 06/12/2018 0453   MCH 31.7 06/12/2018 0453   MCHC 32.3 06/12/2018 0453   RDW 14.7 06/12/2018 0453   LYMPHSABS 2.1 06/09/2018 0844   MONOABS 0.8 06/09/2018 0844   EOSABS 0.1 06/09/2018 0844   BASOSABS 0.0 06/09/2018 0844    CMP     Component Value Date/Time   NA 136 06/12/2018 0453   K 4.7 06/12/2018 0453   CL 107 06/12/2018 0453   CO2 20 (L) 06/12/2018 0453   GLUCOSE 156 (H) 06/12/2018 0453   BUN 64 (H) 06/12/2018 0453   CREATININE 2.96 (H) 06/12/2018 0453   CALCIUM 10.2 06/12/2018 0453   PROT 6.7 12/26/2017 2117   ALBUMIN 3.3 (L) 12/26/2017 2117   AST 26 12/26/2017 2117   ALT 13 (L) 12/26/2017 2117   ALKPHOS 64 12/26/2017 2117   BILITOT 1.2 12/26/2017 2117    GFRNONAA 12 (L) 06/12/2018 0453   GFRAA 14 (L) 06/12/2018 0453    Lipid Panel  Component Value Date/Time   CHOL 160 08/21/2015 1636   TRIG 105.0 08/21/2015 1636   HDL 71.80 08/21/2015 1636   CHOLHDL 2 08/21/2015 1636   VLDL 21.0 08/21/2015 1636   LDLCALC 68 08/21/2015 1636     Imaging I have reviewed the images obtained: CT-scan of the brain done stat showed no acute changes.  Significant amount of atrophy, chronic low cones as well as evidence of old left parietal stroke on CT.  Right parietal scalp hematoma.   Assessment:  This is a 84 year old Caucasian woman with a past medical history as above, admitted for a right leg fracture secondary to fall, who had a sudden onset of mental status change this morning where she would not follow commands or not talk. My examination as documented above was essentially nonfocal and secondary to noncooperation. I suspect that this is likely related to toxic metabolic encephalopathy versus hospital delirium in a patient with baseline dementia and a concomitant UTI. I do not have a suspicion for an underlying stroke being the etiology of this current change. If she continues to be altered in her mentation, I would recommend obtaining an EEG, but for now I think conservative measures such as reducing sedating medications etc. and optimizing her medical treatment would be the best course of action  Even if her exam was suspicious for a stroke, given the significant fracture as well as downtrending hemoglobin requiring blood transfusion yesterday, I would have been reluctant to give her TPA. She has no signs of large vessel occlusion and given her chronic kidney disease, I did not pursue CT angiogram.   Impression: Likely toxic metabolic encephalopathy- multifactorial including secondary to UTI Possible underlying baseline dementia- with behavioral changes being exhibited due to acute hospitalization Right leg fracture status post reduction  with a wound VAC in place. Anemia  Recommendations: Minimize sedating medications and pain medications I would be especially weary of using Lyrica at high doses on her. I do understand that she has a painful fracture and needs adequate pain control but given her age, and CT appearance that would suggest that she has underlying dementia or cognitive deficits, any pain medications/sedating medications might have paradoxical and adverse reactions on her mentation. Management of UTI per primary team.  Consider an EEG if she continues to be altered.  My suspicion for seizure activity is low.  Neurology will be available as needed.  Please call us with questions.   -- Amie Portland, MD [facility] Pager: [phone] If 7pm to 7am, please call on call as listed on AMION.  CRITICAL CARE ATTESTATION This patient is critically ill and at significant risk of neurological worsening, death and care requires constant monitoring of vital signs, hemodynamics,respiratory and cardiac monitoring. I spent 45  minutes of neurocritical care time performing neurological assessment, discussion with family, other specialists and medical decision making of high complexityin the care of  this patient.

## 2018-06-12 NOTE — Progress Notes (Signed)
Initial Nutrition Assessment  DOCUMENTATION CODES:   Not applicable  INTERVENTION:    Ensure Enlive po daily, each supplement provides 350 kcal and 20 grams of protein  Prostat liquid protein po 30 ml BID with meals, each supplement provides 100 kcal, 15 grams protein  NUTRITION DIAGNOSIS:   Increased nutrient needs related to wound healing as evidenced by estimated needs  GOAL:   Patient will meet greater than or equal to 90% of their needs  MONITOR:   PO intake, Supplement acceptance, Labs, Skin, Weight trends, I & O's  REASON FOR ASSESSMENT:   Consult Assessment of nutrition requirement/status, Wound healing  ASSESSMENT:   84 y.o. Female with PMH significant of DM; hypothyroidism; HTN; and CKD stage 3 presenting with a fall at SNF.  She thinks she got up and had a mechanical fall.  Her leg was clearly broken with an open and very dislocated fracture.   Pt admitted from [facility]. She was found down after likely falling and sustained a distal tibia fracture. RD spoke with pt's son and daughter-in-law at bedside. Pt is confused.  Son reports as far as he knows pt was eating well PTA. SNF staff have not told him otherwise. Family requested an Ensure Enlive supplement for pt at time of RD visit. Pt was NPO.  This AM pt had an episode of new onset L sided facial droop and L arm weakness.    Dr. Grandville Silos advanced pt's diet 1215 and ordered SLP c/s for swallow evaluation. SLP note reviewed. Recommend continuing with Regular, thin liquid diet. Labs and medications reviewed. CBG's [id number].  NUTRITION - FOCUSED PHYSICAL EXAM:  Deferred at this time.  Diet Order:   Diet Order           Diet regular Room service appropriate? Yes; Fluid consistency: Thin  Diet effective now         EDUCATION NEEDS:   No education needs have been identified at this time  Skin:  Skin Assessment: Skin Integrity Issues: Skin Integrity Issues:: Wound VAC Wound Vac: R  leg  Last BM:  6/14  Height:   Ht Readings from Last 1 Encounters:  06/09/18 5\' 7"  (1.702 m)   Weight:   Wt Readings from Last 1 Encounters:  06/12/18 145 lb 15.1 oz (66.2 kg)   BMI:  Body mass index is 22.86 kg/m.  Estimated Nutritional Needs:   Kcal:  1500-1650  Protein:  90-100 gm  Fluid:  >/= 1.5 L  Arthur Holms, RD, LDN Pager #: [phone] After-Hours Pager #: [phone]

## 2018-06-12 NOTE — Progress Notes (Signed)
PROGRESS NOTE    [name]  XHB:[id number] DOB: [birth year] DOA: 06/09/2018 PCP: Leanna Battles, MD   Brief Narrative:  [name] is a 84 y.o. female with medical history significant of DM; hypothyroidism; HTN; and CKD stage 3 presenting with a fall at SNF.  She thinks she got up and had a mechanical fall.  Her leg was clearly broken with an open and very dislocated fracture.    PDX was consulted and patient underwent surgery 06/09/2018.  Urinalysis done concerning for UTI.  Chest x-ray done concerning for pneumonia.     Assessment & Plan:   Principal Problem:   Tibia and fibula open fracture, right Active Problems:   Aphasia   Hypothyroidism   Chronic kidney disease, stage III (moderate) (HCC)   Controlled diabetes mellitus with diabetic neuropathy, with long-term current use of insulin (HCC)   Unspecified atrial fibrillation (HCC)   Essential hypertension   HCAP (healthcare-associated pneumonia)   Acute lower UTI   Postoperative anemia  #1 grade 3B open right tibia-fibula fracture Secondary to mechanical fall.  Patient status post I&D of open right tibia fracture including removal of bone, IM nailing of right tibia, complex partial closure right tibial traumatic wound and skin tears, infuse allografting, placement of small wound VAC and antibiotic beads per orthopedics.  PT/OT.  Per orthopedics.  #2 aphasia Patient suddenly nonverbal this morning per RN after starting to eat her breakfast.  Assessed patient patient not responding to questions/nonverbal/aphasia.  Patient following commands and moving extremities spontaneously.  Sensation seems intact.  Patient refusing reflexes to be assessed.  No facial droop.  Patient with recent surgery, on admission was noted to have A. fib however was rate controlled.  Concern for acute CVA.  Stat head CT.  If stat head CT is consistent with an acute stroke will need a MRI of the head and stroke work-up.  Neurology has been  consulted per rapid response nurse.  Keep n.p.o. for now.  If patient with an acute stroke we will discontinue antihypertensive medications for now to allow for permissive hypertension.  Check a chest x-ray, EKG.  Place on telemetry.  Patient being treated for pneumonia with empiric IV antibiotics.  Follow.  3.  HCAP Noted on chest x-ray admission.  Sputum Gram stain and culture pending.  Blood cultures pending with no growth to date.  Urine strep pneumococcus antigen negative.  Urine Legionella antigen negative.  IV vancomycin has been discontinued.  Continue IV cefepime.  Follow.    5.  Bacteria in urine Urine cultures negative.   6.  Hypothyroidism TSH elevated at 7.129.  Free T4 within normal limits.  Continue home dose Synthroid.  Repeat thyroid function studies in about 4 to 6 weeks.  Follow.  7.  Atrial fibrillation Noted on EKG on presentation.  Patient with no prior history of A. fib.  Patient currently rate controlled.  Continue beta-blocker.  Patient would be a poor candidate for anticoagulation secondary to age, fall risks.  Continue aspirin.    8.  Acute on chronic kidney disease stage III Gentle hydration.  ARB on hold.  Renal function stable.  Renal ultrasound with findings consistent with chronic medical renal disease.  Moderate left hydronephrosis stable since prior CT.  Posterior bladder wall thickening similar to prior CT.  Follow.    9.  Hypertension Blood pressures in the 190s.  Patient aphasic this morning and concern for CVA.  Continue current regimen of Norvasc and Lopressor.    10.  Hyperlipidemia Continue statin.  11.  Diabetes mellitus II Hemoglobin A1c 7.5.  CBGs 140 this morning.  Continue to hold oral hypoglycemic agents.  Continue Lantus and sliding scale insulin for now.  12.  Postop anemia Hemoglobin currently at 9.3 from 7.3 from 10.9 on day of admission.  Likely secondary to acute blood loss perioperatively.  Status post 1 unit packed red blood cells on  06/11/2018.  Patient with no overt bleeding.  Follow H&H.    DVT prophylaxis: SCDs/postop per orthopedics Code Status: DNR Family Communication: Updated patient and at bedside.   Disposition Plan: Skilled nursing facility.   Consultants:   Orthopedics: Dr. Marcelino Scot 06/09/2018  Procedures:   CT head CT C-spine 06/09/2018  Films of the right ankle 06/09/2018, chest x-ray 06/09/2018  Plain films of the right tib-fib 06/09/2018  I and D of open right tibia fracture including removal of bone/IM nailing of right tibia/complex partial closure right tibial traumatic wound and skin tears 8 cm/infuse allografting/application of small wound VAC/placement of antibiotic beads per Dr. Marcelino Scot 06/09/2018  1 unit PRBCs. 06/11/2018  Renal US 06/11/2018  Antimicrobials:   IV Cefepime 06/09/2018  IV vancomycin 06/09/2018>>>>>>06/10/2018   Subjective: Per RN patient started to eat some breakfast and then subsequently became aphasic however her alert.  Patient not answering questions.  Grandson at bedside.  Patient shakes her head no when asked whether she is short of breath.  Shakes her head when asked whether she is has any chest pain.  Rapid response nurse at bedside.  Objective: Vitals:   06/12/18 0343 06/12/18 0500 06/12/18 0805 06/12/18 0810  BP: (!) 148/45  (!) 202/58 (!) 190/61  Pulse: 61   (!) 54  Resp:      Temp: 98.1 F (36.7 C)   98.3 F (36.8 C)  TempSrc: Oral     SpO2: 94%     Weight:  66.2 kg (145 lb 15.1 oz)    Height:        Intake/Output Summary (Last 24 hours) at 06/12/2018 0829 Last data filed at 06/12/2018 0704 Gross per 24 hour  Intake 799.33 ml  Output 500 ml  Net 299.33 ml   Filed Weights   06/09/18 0941 06/12/18 0500  Weight: 68.7 kg (151 lb 7.3 oz) 66.2 kg (145 lb 15.1 oz)    Examination:  General exam: Alert. Nonverbal Respiratory system: Lungs clear to auscultation bilaterally anterior lung fields.  No wheezes, no crackles, no rhonchi.  Respiratory effort  normal. Cardiovascular system: RRR no m/r/g. No LE edema. No m/r/g. Gastrointestinal system: Abdomen is soft/NT/ND/+BS.  Central nervous system: Alert verbal/aphasic.  Cranial nerves II through XII grossly intact.  Moving extremities spontaneously.  5 out of 5 bilateral upper extremity strength.  Refusing reflexes to be assessed and withdraws left leg.  Sensation intact.  No facial asymmetry.  Refuses to open her mouth.  Right lower extremity in brace.   Extremities: Symmetric 5 x 5 power upper extremities.  Right lower extremity in brace.  4/5 left lower extremity strength. Skin: No rashes, lesions or ulcers Psychiatry: Judgement and insight appear fair. Mood & affect appropriate.     Data Reviewed: I have personally reviewed following labs and imaging studies  CBC: Recent Labs  Lab 06/09/18 0844 06/10/18 1620 06/11/18 0557 06/11/18 1916 06/12/18 0453  WBC 12.6* 14.5* 10.2  --  12.1*  NEUTROABS 9.5*  --   --   --   --   HGB 10.9* 7.8* 7.3* 8.7* 9.3*  HCT 33.7* 24.3*  23.0* 27.1* 28.8*  MCV 100.0 101.7* 100.9*  --  98.3  PLT 138* 120* 107*  --  425*   Basic Metabolic Panel: Recent Labs  Lab 06/09/18 0844 06/10/18 1620 06/11/18 0557 06/12/18 0453  NA 137 137 138 136  K 4.5 3.8 4.9 4.7  CL 103 105 106 107  CO2 24 21* 23 20*  GLUCOSE 209* 56* 170* 156*  BUN 65* 65* 64* 64*  CREATININE 3.29* 2.93* 3.00* 2.96*  CALCIUM 10.2 9.6 9.7 10.2  MG  --  1.8  --   --    GFR: Estimated Creatinine Clearance: 9.8 mL/min (A) (by C-G formula based on SCr of 2.96 mg/dL (H)). Liver Function Tests: No results for input(s): AST, ALT, ALKPHOS, BILITOT, PROT, ALBUMIN in the last 168 hours. No results for input(s): LIPASE, AMYLASE in the last 168 hours. No results for input(s): AMMONIA in the last 168 hours. Coagulation Profile: Recent Labs  Lab 06/09/18 0844  INR 1.07   Cardiac Enzymes: No results for input(s): CKTOTAL, CKMB, CKMBINDEX, TROPONINI in the last 168 hours. BNP (last 3  results) No results for input(s): PROBNP in the last 8760 hours. HbA1C: Recent Labs    06/09/18 1855  HGBA1C 7.5*   CBG: Recent Labs  Lab 06/11/18 0704 06/11/18 1102 06/11/18 1705 06/12/18 0657 06/12/18 0819  GLUCAP 167* 128* 88 157* 140*   Lipid Profile: No results for input(s): CHOL, HDL, LDLCALC, TRIG, CHOLHDL, LDLDIRECT in the last 72 hours. Thyroid Function Tests: Recent Labs    06/09/18 2050  TSH 7.129*  FREET4 1.09   Anemia Panel: No results for input(s): VITAMINB12, FOLATE, FERRITIN, TIBC, IRON, RETICCTPCT in the last 72 hours. Sepsis Labs: No results for input(s): PROCALCITON, LATICACIDVEN in the last 168 hours.  Recent Results (from the past 240 hour(s))  Culture, blood (routine x 2)     Status: None (Preliminary result)   Collection Time: 06/09/18 10:05 AM  Result Value Ref Range Status   Specimen Description BLOOD RIGHT FOREARM  Final   Special Requests   Final    BOTTLES DRAWN AEROBIC AND ANAEROBIC Blood Culture adequate volume   Culture   Final    NO GROWTH 2 DAYS Performed at [facility], [address] [place], [place] [address]    Report Status PENDING  Incomplete  Urine Culture     Status: None   Collection Time: 06/10/18 11:04 AM  Result Value Ref Range Status   Specimen Description URINE, CLEAN CATCH  Final   Special Requests NONE  Final   Culture   Final    NO GROWTH Performed at [facility], [place] [address] [place], [place] [address]    Report Status 06/11/2018 FINAL  Final  MRSA PCR Screening     Status: None   Collection Time: 06/10/18 11:04 AM  Result Value Ref Range Status   MRSA by PCR NEGATIVE NEGATIVE Final    Comment:        The GeneXpert MRSA Assay (FDA approved for NASAL specimens only), is one component of a comprehensive MRSA colonization surveillance program. It is not intended to diagnose MRSA infection nor to guide or monitor treatment for MRSA infections. Performed at [facility], [place] [address], [place], [place] [address]          Radiology Studies: US Renal  Result Date: 06/11/2018 CLINICAL DATA:  Acute renal failure EXAM: RENAL / URINARY TRACT ULTRASOUND COMPLETE COMPARISON:  CT 12/27/2017 FINDINGS: Right Kidney: Length:  8.6 cm. Cortical thinning and increased echotexture. No mass or hydronephrosis. Left Kidney: Length: 9.2 cm. Cortical thinning and increased echotexture. Moderate left hydronephrosis, stable since prior CT. Bladder: Posterior bladder wall thickening noted, similar to prior CT. IMPRESSION: Cortical thinning and increased echotexture throughout the kidneys bilaterally compatible with chronic medical renal disease. Moderate left hydronephrosis is stable since prior CT. Posterior bladder wall thickening is similar to prior CT. Given the asymmetric localized nature to the posterior bladder wall, cannot exclude bladder cancer. Consider further evaluation with cystoscopy if felt clinically indicated. Electronically Signed   By: Rolm Baptise M.D.   On: 06/11/2018 10:27        Scheduled Meds: . acetaminophen  500 mg Oral Q12H  . acetaminophen  650 mg Oral Once  . amLODipine  2.5 mg Oral Daily  . aspirin EC  325 mg Oral Daily  . chlorhexidine  60 mL Topical Once  . docusate sodium  100 mg Oral BID  . enoxaparin (LOVENOX) injection  30 mg Subcutaneous Q24H  . ferrous sulfate  325 mg Oral Q breakfast  . insulin aspart  0-9 Units Subcutaneous TID WC  . insulin glargine  7 Units Subcutaneous Daily  . ipratropium  2 spray Each Nare TID AC  . levothyroxine  88 mcg Oral QAC breakfast  . metoprolol tartrate  25 mg Oral BID  . montelukast  10 mg Oral Daily  . pantoprazole  40 mg Oral Daily  . polyethylene glycol  17 g Oral Daily  . povidone-iodine  2 application Topical Once  . pravastatin  20 mg Oral Daily  . pregabalin  100 mg Oral Daily  . senna-docusate  1 tablet Oral Daily   Continuous Infusions: . ceFEPime (MAXIPIME) IV Stopped (06/11/18 1528)     LOS:  3 days    Time spent: 40 minutes    Irine Seal, MD [facility] Pager [phone]  If 7PM-7AM, please contact night-coverage [email or web address] Password [facility] 06/12/2018, 8:29 AM

## 2018-06-12 NOTE — Progress Notes (Signed)
Physical Therapy Treatment Patient Details Name: [name] MRN: [id number] DOB: 1915/06/29 Today's Date: 06/12/2018    History of Present Illness Pt is a 84 y.o. female s/p R tibial IM nail with I&D. PMHx: CKD, HTN, Hypothyroidism, DM2.    PT Comments    Pt making fair progress with mobility but continues to require heavy physical assistance of two for functional mobility. Pt tolerated lateral scoot transfer from bed to chair with max A x2. Pt would continue to benefit from skilled physical therapy services at this time while admitted and after d/c to address the below listed limitations in order to improve overall safety and independence with functional mobility.    Follow Up Recommendations  SNF     Equipment Recommendations  None recommended by PT    Recommendations for Other Services       Precautions / Restrictions Precautions Precautions: Fall Restrictions Weight Bearing Restrictions: Yes RLE Weight Bearing: Non weight bearing    Mobility  Bed Mobility Overal bed mobility: Needs Assistance Bed Mobility: Supine to Sit     Supine to sit: Total assist;+2 for physical assistance     General bed mobility comments: total A for bilateral LE movement and for trunk elevation to achieve sitting EOB  Transfers Overall transfer level: Needs assistance Equipment used: 2 person hand held assist Transfers: Lateral/Scoot Transfers          Lateral/Scoot Transfers: Max assist;+2 physical assistance General transfer comment: lateral scoot transfer performed to pt's L side with use of bed pad and gait belt  Ambulation/Gait                 Stairs             Wheelchair Mobility    Modified Rankin (Stroke Patients Only)       Balance Overall balance assessment: Needs assistance Sitting-balance support: Bilateral upper extremity supported Sitting balance-Leahy Scale: Poor                                      Cognition  Arousal/Alertness: Awake/alert Behavior During Therapy: WFL for tasks assessed/performed Overall Cognitive Status: Impaired/Different from baseline Area of Impairment: Orientation;Attention;Memory;Following commands;Safety/judgement;Problem solving                 Orientation Level: Disoriented to;Situation;Time Current Attention Level: Sustained Memory: Decreased short-term memory;Decreased recall of precautions Following Commands: Follows one step commands inconsistently;Follows one step commands with increased time Safety/Judgement: Decreased awareness of deficits;Decreased awareness of safety   Problem Solving: Slow processing;Decreased initiation;Difficulty sequencing;Requires tactile cues;Requires verbal cues        Exercises      General Comments        Pertinent Vitals/Pain Pain Assessment: Faces Faces Pain Scale: No hurt    Home Living                      Prior Function            PT Goals (current goals can now be found in the care plan section) Acute Rehab PT Goals PT Goal Formulation: Patient unable to participate in goal setting Time For Goal Achievement: 06/24/18 Potential to Achieve Goals: Fair Progress towards PT goals: Progressing toward goals    Frequency    Min 3X/week      PT Plan Current plan remains appropriate    Co-evaluation  AM-PAC PT "6 Clicks" Daily Activity  Outcome Measure  Difficulty turning over in bed (including adjusting bedclothes, sheets and blankets)?: Unable Difficulty moving from lying on back to sitting on the side of the bed? : Unable Difficulty sitting down on and standing up from a chair with arms (e.g., wheelchair, bedside commode, etc,.)?: Unable Help needed moving to and from a bed to chair (including a wheelchair)?: Total Help needed walking in hospital room?: Total Help needed climbing 3-5 steps with a railing? : Total 6 Click Score: 6    End of Session Equipment Utilized  During Treatment: Gait belt Activity Tolerance: Patient limited by fatigue Patient left: in chair;with call bell/phone within reach;with family/visitor present Nurse Communication: Mobility status PT Visit Diagnosis: Other abnormalities of gait and mobility (R26.89);Pain Pain - Right/Left: Right Pain - part of body: Leg     Time: 3374-4514 PT Time Calculation (min) (ACUTE ONLY): 30 min  Charges:  $Therapeutic Activity: 23-37 mins                    G Codes:       [place], [place], [place] Mary Esther 06/12/2018, 3:53 PM

## 2018-06-12 NOTE — Progress Notes (Signed)
RN walked in on patient to attempt to get the sputum culture RN called out to patient and she would not respond. RN tried rubbing her arm and calling her name still would not respond. Sunday Spillers, RN and Margretta Sidle, RN stopped in room we grabbed vital signs and sternal rubbing pt she swung her arms but still did not speak and had a fixed look on her face. VS in computer first set of vitals BP was 202/58 RN called rapid Nurse and paged [facility] about what was going on. Rapid nurse came up tried to call pt name she did not answer tried to get her to smile she would not respond took vital signs again updated in Epic. MD showed up and tried to get pt to respond she did not respond verbally but she would move her arms in resistance. Pt grandson at bedside and she would respond to him physically such as holding his hand and would do what he said but no one else's. Rapid nurse called Neuro and they wanted to rush her down for a stat CT in case if it was a stroke. Pt CBG was 140. Manual BP 190/60.

## 2018-06-13 DIAGNOSIS — G9341 Metabolic encephalopathy: Secondary | ICD-10-CM

## 2018-06-13 LAB — CBC
HCT: 27.4 % — ABNORMAL LOW (ref 36.0–46.0)
HEMOGLOBIN: 8.8 g/dL — AB (ref 12.0–15.0)
MCH: 31.4 pg (ref 26.0–34.0)
MCHC: 32.1 g/dL (ref 30.0–36.0)
MCV: 97.9 fL (ref 78.0–100.0)
PLATELETS: 112 10*3/uL — AB (ref 150–400)
RBC: 2.8 MIL/uL — AB (ref 3.87–5.11)
RDW: 14.2 % (ref 11.5–15.5)
WBC: 9.7 10*3/uL (ref 4.0–10.5)

## 2018-06-13 LAB — GLUCOSE, CAPILLARY
GLUCOSE-CAPILLARY: 207 mg/dL — AB (ref 65–99)
GLUCOSE-CAPILLARY: 199 mg/dL — AB (ref 65–99)
Glucose-Capillary: 183 mg/dL — ABNORMAL HIGH (ref 65–99)
Glucose-Capillary: 102 mg/dL — ABNORMAL HIGH (ref 65–99)
Glucose-Capillary: 92 mg/dL (ref 65–99)

## 2018-06-13 LAB — BPAM RBC
Blood Product Expiration Date: 201907082359
Blood Product Expiration Date: 201907082359
ISSUE DATE / TIME: 201906091208
ISSUE DATE / TIME: 201906131132
Unit Type and Rh: 9500
Unit Type and Rh: 9500

## 2018-06-13 LAB — TYPE AND SCREEN
ABO/RH(D): O NEG
Antibody Screen: POSITIVE
DONOR AG TYPE: NEGATIVE
DONOR AG TYPE: NEGATIVE
UNIT DIVISION: 0
UNIT DIVISION: 0

## 2018-06-13 LAB — BASIC METABOLIC PANEL
ANION GAP: 9 (ref 5–15)
BUN: 67 mg/dL — AB (ref 6–20)
CHLORIDE: 109 mmol/L (ref 101–111)
CO2: 22 mmol/L (ref 22–32)
Calcium: 10.5 mg/dL — ABNORMAL HIGH (ref 8.9–10.3)
Creatinine, Ser: 2.91 mg/dL — ABNORMAL HIGH (ref 0.44–1.00)
GFR calc Af Amer: 14 mL/min — ABNORMAL LOW (ref >60)
GFR, EST NON AFRICAN AMERICAN: 12 mL/min — AB (ref >60)
Glucose, Bld: 194 mg/dL — ABNORMAL HIGH (ref 65–99)
POTASSIUM: 4.6 mmol/L (ref 3.5–5.1)
SODIUM: 140 mmol/L (ref 135–145)

## 2018-06-13 MED ORDER — CEFDINIR 300 MG PO CAPS
300.0000 mg | ORAL_CAPSULE | Freq: Two times a day (BID) | ORAL | Status: DC
  Administered 2018-06-13 – 2018-06-16 (×5): 300 mg via ORAL
  Filled 2018-06-13 (×9): qty 1

## 2018-06-13 MED ORDER — CEFDINIR 300 MG PO CAPS: 300.0000 mg | ORAL_CAPSULE | Freq: Two times a day (BID) | ORAL | Status: DC

## 2018-06-13 MED ORDER — AMLODIPINE BESYLATE 5 MG PO TABS
5.0000 mg | ORAL_TABLET | Freq: Every day | ORAL | Status: DC
  Administered 2018-06-13 – 2018-06-17 (×4): 5 mg via ORAL
  Filled 2018-06-13 (×5): qty 1

## 2018-06-13 MED ORDER — FUROSEMIDE 10 MG/ML IJ SOLN
20.0000 mg | Freq: Once | INTRAMUSCULAR | Status: AC
  Administered 2018-06-13: 20 mg via INTRAVENOUS
  Filled 2018-06-13: qty 2

## 2018-06-13 NOTE — Progress Notes (Signed)
PROGRESS NOTE    [name]  YNW:[id number] DOB: 1915-02-16 DOA: 06/09/2018 PCP: Leanna Battles, MD   Brief Narrative:  [name] is a 84 y.o. female with medical history significant of DM; hypothyroidism; HTN; and CKD stage 3 presenting with a fall at SNF.  She thinks she got up and had a mechanical fall.  Her leg was clearly broken with an open and very dislocated fracture.    PDX was consulted and patient underwent surgery 06/09/2018.  Urinalysis done concerning for UTI.  Chest x-ray done concerning for pneumonia.     Assessment & Plan:   Principal Problem:   Tibia and fibula open fracture, right Active Problems:   Aphasia   Hypothyroidism   Chronic kidney disease, stage III (moderate) (HCC)   Controlled diabetes mellitus with diabetic neuropathy, with long-term current use of insulin (HCC)   Unspecified atrial fibrillation (HCC)   Essential hypertension   HCAP (healthcare-associated pneumonia)   Acute lower UTI   Postoperative anemia  #1 grade 3B open right tibia-fibula fracture Secondary to mechanical fall.  Patient status post I&D of open right tibia fracture including removal of bone, IM nailing of right tibia, complex partial closure right tibial traumatic wound and skin tears, infuse allografting, placement of small wound VAC and antibiotic beads per orthopedics.  PT/OT.  Per orthopedics.  #2  Acute encephalopathy Patient suddenly nonverbal the morning of 06/12/2018 per RN after starting to eat her breakfast.  Assessed patient patient not responding to questions/nonverbal/aphasia.  Patient following commands and moving extremities spontaneously.  Sensation seems intact.  Patient refusing reflexes to be assessed.  No facial droop.  Patient with recent surgery, on admission was noted to have A. fib however was rate controlled.   Concern for an acute CVA and as such CT head was done which was negative for any acute abnormalities.  Patient improved clinically and  answering questions appropriately and verbal later on during the day of 06/12/2018 when his son came to visit her.  Neurology assessed the patient and felt acute encephalopathy was likely metabolic encephalopathy.  Patient has been reassessed by speech therapy and diet resumed.  Work-up so far has been unremarkable.  Patient with some coarse breath sounds today and as such we will give a dose of Lasix  20 mg IV x1 and monitor urine output and clinical response.  Discontinue IV cefepime and placed on oral Vantin to complete course of antibiotic treatment for probable HCAP.   3.  HCAP Noted on chest x-ray admission.  Patient noted to have some expiratory wheezing and some coarse breath sounds in the anterior lung fields today.  Sputum Gram stain and culture pending.  Blood cultures pending with no growth to date.  Urine strep pneumococcus antigen negative.  Urine Legionella antigen negative.  IV vancomycin has been discontinued.  Discontinue IV cefepime.  Start oral Vantin to complete 7-day course of antibiotic treatment.  We will give a dose of Lasix 20 mg IV x1 and monitor urine output.  Follow.    5.  Bacteria in urine Urine cultures negative.   6.  Hypothyroidism TSH elevated at 7.129.  Free T4 within normal limits.  Continue home dose Synthroid.  Repeat thyroid function studies in about 4 to 6 weeks.  Follow.  7.  Atrial fibrillation Noted on EKG on presentation.  Patient with no prior history of A. fib.  Patient currently rate controlled.  Beta-blocker discontinued as heart rate noted in the 50s.  Patient would be  a poor candidate for anticoagulation secondary to age, fall risks.  Continue aspirin.    8.  Acute on chronic kidney disease stage III Improved.  Saline lock IV fluids.  ARB on hold.  Renal function stable.  Renal ultrasound with findings consistent with chronic medical renal disease.  Moderate left hydronephrosis stable since prior CT.  Posterior bladder wall thickening similar to prior  CT.  Follow.    9.  Hypertension Blood pressures added as patient's Norvasc and Lopressor were discontinued due to heart rate in the 50s and initial concern for CVA.  CT head negative.  Lasix 40 mg IV x1.  Resumed Norvasc.   10.  Hyperlipidemia Continue statin.  11.  Diabetes mellitus II Hemoglobin A1c 7.5.  CBGs 207 this morning.  Continue to hold oral hypoglycemic agents.  Continue Lantus and sliding scale insulin for now.  12.  Postop anemia Hemoglobin currently at 8.8 from 9.3 from 7.3 from 10.9 on day of admission.  Likely secondary to acute blood loss perioperatively.  Status post 1 unit packed red blood cells on 06/11/2018.  Patient with no overt bleeding.  Follow H&H.    DVT prophylaxis: SCDs/postop per orthopedics Code Status: DNR Family Communication: No family at bedside.  And at bedside.   Disposition Plan: Skilled nursing facility when clinically improved, medically stable and at baseline.   Consultants:   Orthopedics: Dr. Marcelino Scot 06/09/2018  Procedures:   CT head CT C-spine 06/09/2018  Films of the right ankle 06/09/2018, chest x-ray 06/09/2018  Plain films of the right tib-fib 06/09/2018  I and D of open right tibia fracture including removal of bone/IM nailing of right tibia/complex partial closure right tibial traumatic wound and skin tears 8 cm/infuse allografting/application of small wound VAC/placement of antibiotic beads per Dr. Marcelino Scot 06/09/2018  1 unit PRBCs. 06/11/2018  Renal US 06/11/2018  Antimicrobials:   IV Cefepime 06/09/2018>>>>> 06/13/2018  IV vancomycin 06/09/2018>>>>>>06/10/2018  Vantin 06/13/2018   Subjective: Patient sleeping.  Easily arousable however drowsy.  Some complaints of some shortness of breath.  Denies any chest pain.   Objective: Vitals:   06/12/18 1311 06/12/18 1959 06/13/18 0359 06/13/18 1327  BP: (!) 152/47 (!) 165/41 (!) 161/35 (!) 163/53  Pulse: (!) 54 (!) 57 (!) 55 61  Resp: 16   17  Temp: 98.4 F (36.9 C) 98.7 F (37.1  C) 97.8 F (36.6 C) 98.1 F (36.7 C)  TempSrc: Oral Axillary Axillary Axillary  SpO2: 95% 94% 96% 93%  Weight:      Height:        Intake/Output Summary (Last 24 hours) at 06/13/2018 1416 Last data filed at 06/13/2018 0948 Gross per 24 hour  Intake -  Output 900 ml  Net -900 ml   Filed Weights   06/09/18 0941 06/12/18 0500  Weight: 68.7 kg (151 lb 7.3 oz) 66.2 kg (145 lb 15.1 oz)    Examination:  General exam: Sleeping. Respiratory system: Some coarse rhonchorous breath sounds anterior lung fields.  Minimal expiratory wheezing.  Respiratory effort normal. Cardiovascular system: Irregularly irregular.  No lower extremity edema.  No murmurs rubs or gallops. Gastrointestinal system: Abdomen is soft/NT/ND/+BS.  Central nervous system: Sleeping easily arousable. Cranial nerves II through XII grossly intact.  Moving extremities spontaneously.  4/5 bilateral upper extremity strength.  No facial asymmetry.  Right lower extremity in brace.   Extremities: Symmetric 5 x 5 power upper extremities.  Right lower extremity in brace.  4/5 left lower extremity strength. Skin: No rashes, lesions or ulcers  Psychiatry: Judgement and insight appear fair. Mood & affect appropriate.     Data Reviewed: I have personally reviewed following labs and imaging studies  CBC: Recent Labs  Lab 06/09/18 0844 06/10/18 1620 06/11/18 0557 06/11/18 1916 06/12/18 0453 06/13/18 0533  WBC 12.6* 14.5* 10.2  --  12.1* 9.7  NEUTROABS 9.5*  --   --   --   --   --   HGB 10.9* 7.8* 7.3* 8.7* 9.3* 8.8*  HCT 33.7* 24.3* 23.0* 27.1* 28.8* 27.4*  MCV 100.0 101.7* 100.9*  --  98.3 97.9  PLT 138* 120* 107*  --  100* 341*   Basic Metabolic Panel: Recent Labs  Lab 06/09/18 0844 06/10/18 1620 06/11/18 0557 06/12/18 0453 06/13/18 0533  NA 137 137 138 136 140  K 4.5 3.8 4.9 4.7 4.6  CL 103 105 106 107 109  CO2 24 21* 23 20* 22  GLUCOSE 209* 56* 170* 156* 194*  BUN 65* 65* 64* 64* 67*  CREATININE 3.29* 2.93*  3.00* 2.96* 2.91*  CALCIUM 10.2 9.6 9.7 10.2 10.5*  MG  --  1.8  --  2.0  --    GFR: Estimated Creatinine Clearance: 10 mL/min (A) (by C-G formula based on SCr of 2.91 mg/dL (H)). Liver Function Tests: No results for input(s): AST, ALT, ALKPHOS, BILITOT, PROT, ALBUMIN in the last 168 hours. No results for input(s): LIPASE, AMYLASE in the last 168 hours. No results for input(s): AMMONIA in the last 168 hours. Coagulation Profile: Recent Labs  Lab 06/09/18 0844  INR 1.07   Cardiac Enzymes: No results for input(s): CKTOTAL, CKMB, CKMBINDEX, TROPONINI in the last 168 hours. BNP (last 3 results) No results for input(s): PROBNP in the last 8760 hours. HbA1C: No results for input(s): HGBA1C in the last 72 hours. CBG: Recent Labs  Lab 06/12/18 1630 06/12/18 2037 06/13/18 0642 06/13/18 0738 06/13/18 1130  GLUCAP 165* 239* 183* 207* 199*   Lipid Profile: No results for input(s): CHOL, HDL, LDLCALC, TRIG, CHOLHDL, LDLDIRECT in the last 72 hours. Thyroid Function Tests: No results for input(s): TSH, T4TOTAL, FREET4, T3FREE, THYROIDAB in the last 72 hours. Anemia Panel: No results for input(s): VITAMINB12, FOLATE, FERRITIN, TIBC, IRON, RETICCTPCT in the last 72 hours. Sepsis Labs: No results for input(s): PROCALCITON, LATICACIDVEN in the last 168 hours.  Recent Results (from the past 240 hour(s))  Culture, blood (routine x 2)     Status: None (Preliminary result)   Collection Time: 06/09/18 10:05 AM  Result Value Ref Range Status   Specimen Description BLOOD RIGHT FOREARM  Final   Special Requests   Final    BOTTLES DRAWN AEROBIC AND ANAEROBIC Blood Culture adequate volume   Culture   Final    NO GROWTH 4 DAYS Performed at [facility], [address], [place], [place] [address]    Report Status PENDING  Incomplete  Urine Culture     Status: None   Collection Time: 06/10/18 11:04 AM  Result Value Ref Range Status   Specimen Description URINE, CLEAN CATCH  Final    Special Requests NONE  Final   Culture   Final    NO GROWTH Performed at [facility], [place] [address], [place],  [address]    Report Status 06/11/2018 FINAL  Final  MRSA PCR Screening     Status: None   Collection Time: 06/10/18 11:04 AM  Result Value Ref Range Status   MRSA by PCR NEGATIVE NEGATIVE Final    Comment:  The GeneXpert MRSA Assay (FDA approved for NASAL specimens only), is one component of a comprehensive MRSA colonization surveillance program. It is not intended to diagnose MRSA infection nor to guide or monitor treatment for MRSA infections. Performed at [facility], [place] [address] [place], [place] [address]          Radiology Studies: Dg Chest Port 1 View  Result Date: 06/12/2018 CLINICAL DATA:  Shortness of breath. EXAM: PORTABLE CHEST 1 VIEW COMPARISON:  06/09/2018. FINDINGS: Cardiomegaly. Atelectasis/infiltrate right upper lobe and left lower lobe. Small left pleural effusion. These findings have improved slightly from prior exam P no pneumothorax. Calcified pulmonary nodules noted most likely granulomas. These are stable. Degenerative changes thoracic spine. Thoracic spine scoliosis IMPRESSION: Persistent atelectasis/infiltrate right upper lobe and left lower lobe. Small left pleural effusion. These findings have improved slightly from prior exam. Continued follow-up exams suggested to demonstrate complete clearing. Electronically Signed   By: Marcello Moores  Register   On: 06/12/2018 09:10   Ct Head Code Stroke Wo Contrast  Result Date: 06/12/2018 CLINICAL DATA:  Code stroke. Aphasia. Recent tibia and fibula fractures status post internal fixation. EXAM: CT HEAD WITHOUT CONTRAST TECHNIQUE: Contiguous axial images were obtained from the base of the skull through the vertex without intravenous contrast. COMPARISON:  06/09/2018 FINDINGS: Brain: Chronic infarcts are again seen in the high posterior left frontal lobe, right basal ganglia, and  right cerebellum. There is also an unchanged, tiny chronic left parietal cortical infarct. No definite acute infarct, intracranial hemorrhage, mass, midline shift, or extra-axial fluid collection is identified. Moderate cerebral atrophy is most notable in the parietal regions. Scattered periventricular and subcortical white matter hypodensities are unchanged and nonspecific but compatible with chronic small vessel ischemic disease, mild for age. Vascular: Calcified atherosclerosis at the skull base. No hyperdense vessel. Skull: No fracture or focal osseous lesion. Sinuses/Orbits: Visualized paranasal sinuses are clear. There is chronic left mastoid air cell opacification inferiorly. Bilateral cataract extraction is noted. Other: Persistent small right parietal scalp hematoma. ASPECTS [facility] Stroke Program Early CT Score) - Ganglionic level infarction (caudate, lentiform nuclei, internal capsule, insula, M1-M3 cortex): 7 - Supraganglionic infarction (M4-M6 cortex): 3 Total score (0-10 with 10 being normal): 10 IMPRESSION: 1. No evidence of acute intracranial abnormality. 2. ASPECTS is 10. 3. Chronic infarcts as above. 4. Persistent right parietal scalp hematoma. These results were communicated to Dr. Rory Percy at 8:56 am on 06/12/2018 by text page via the [facility] messaging system. Electronically Signed   By: Logan Bores M.D.   On: 06/12/2018 08:56        Scheduled Meds: . acetaminophen  500 mg Oral TID  . amLODipine  5 mg Oral Daily  . aspirin EC  325 mg Oral Daily  . cefdinir  300 mg Oral Q12H  . chlorhexidine  60 mL Topical Once  . docusate sodium  100 mg Oral BID  . enoxaparin (LOVENOX) injection  30 mg Subcutaneous Q24H  . feeding supplement (ENSURE ENLIVE)  237 mL Oral Q24H  . feeding supplement (PRO-STAT SUGAR FREE 64)  30 mL Oral BID WC  . ferrous sulfate  325 mg Oral Q breakfast  . furosemide  20 mg Intravenous Once  . insulin aspart  0-9 Units Subcutaneous TID WC  . insulin glargine  7 Units  Subcutaneous Daily  . ipratropium  2 spray Each Nare TID AC  . levothyroxine  88 mcg Oral QAC breakfast  . montelukast  10 mg Oral Daily  . pantoprazole  40 mg  Oral Daily  . polyethylene glycol  17 g Oral Daily  . povidone-iodine  2 application Topical Once  . pravastatin  20 mg Oral Daily  . pregabalin  50 mg Oral Daily  . senna-docusate  1 tablet Oral Daily   Continuous Infusions:    LOS: 4 days    Time spent: 40 minutes    Irine Seal, MD [facility] Pager [phone]  If 7PM-7AM, please contact night-coverage [email or web address] Password [email or web address] 06/13/2018, 2:16 PM

## 2018-06-13 NOTE — Progress Notes (Signed)
PHARMACY NOTE:  ANTIMICROBIAL RENAL DOSAGE ADJUSTMENT  Current antimicrobial regimen includes a mismatch between antimicrobial dosage and estimated renal function.  As per policy approved by the Pharmacy & Therapeutics and Medical Executive Committees, the antimicrobial dosage will be adjusted accordingly.  Current antimicrobial dosage:  Cefdinir 300 mg bid  Indication: PNA  Renal Function:  Estimated Creatinine Clearance: 10 mL/min (A) (by C-G formula based on SCr of 2.91 mg/dL (H)). []      On intermittent HD, scheduled: []      On CRRT    Antimicrobial dosage has been changed to:  300 mg daily  Additional comments:   Thank you for allowing pharmacy to be a part of this patient's care.  Lynelle Doctor, [facility] 06/13/2018 9:06 AM

## 2018-06-13 NOTE — Progress Notes (Signed)
  Speech Language Pathology Treatment: Dysphagia  Patient Details Name: Bonnie HOLLIBAUGH MRN: [id number] DOB: 09-Sep-1915 Today's Date: 06/13/2018 Time: 4709-6283 SLP Time Calculation (min) (ACUTE ONLY): 75 min  Assessment / Plan / Recommendation Clinical Impression  Pt difficult to arouse today; SLP repositioned, pt curled back to right side. Max verbal tactile cues not beneficial to improve pts awareness of PO, no oral response to drip of chocolate ensure provided to tongue. Oral care completed with return of dry secretions. RN and family report that when alert pt is able to swallow though confusion if often a barrier to adequate intake. Will f/u for needs next week.   HPI HPI: [name] is a 84 y.o. female with medical history significant of DM; hypothyroidism; HTN; and CKD stage 3 presenting with a fall at SNF.  She thinks she got up and had a mechanical fall.  Her leg was clearly broken with an open and very dislocated fracture.   She then had an episode of new onset left sided facial droop and left arm weakness.  CT head is showing no evidence of acute abnormality.  Most recent CXR is showing persistant atelectasis/infiltrate in the RUL and LLL as well as small pleural effusions which are slightly increased from prior exam.        SLP Plan  Continue with current plan of care       Recommendations  Diet recommendations: Thin liquid;Regular Liquids provided via: Cup;Straw Medication Administration: Whole meds with liquid Supervision: Staff to assist with self feeding;Full supervision/cueing for compensatory strategies Compensations: Slow rate;Small sips/bites Postural Changes and/or Swallow Maneuvers: Seated upright 90 degrees                Oral Care Recommendations: Oral care BID Follow up Recommendations: Skilled Nursing facility SLP Visit Diagnosis: Dysphagia, unspecified (R13.10) Plan: Continue with current plan of care       GO                [name],  Katherene Ponto 06/13/2018, 12:27 PM

## 2018-06-14 ENCOUNTER — Other Ambulatory Visit (HOSPITAL_COMMUNITY): Payer: Medicare HMO

## 2018-06-14 DIAGNOSIS — G9341 Metabolic encephalopathy: Secondary | ICD-10-CM

## 2018-06-14 DIAGNOSIS — E8770 Fluid overload, unspecified: Secondary | ICD-10-CM

## 2018-06-14 LAB — GLUCOSE, CAPILLARY
GLUCOSE-CAPILLARY: 172 mg/dL — AB (ref 65–99)
Glucose-Capillary: 104 mg/dL — ABNORMAL HIGH (ref 65–99)
Glucose-Capillary: 217 mg/dL — ABNORMAL HIGH (ref 65–99)
Glucose-Capillary: 146 mg/dL — ABNORMAL HIGH (ref 65–99)

## 2018-06-14 LAB — BASIC METABOLIC PANEL
Anion gap: 12 (ref 5–15)
BUN: 66 mg/dL — AB (ref 6–20)
CO2: 21 mmol/L — ABNORMAL LOW (ref 22–32)
Calcium: 10.7 mg/dL — ABNORMAL HIGH (ref 8.9–10.3)
Chloride: 108 mmol/L (ref 101–111)
Creatinine, Ser: 2.75 mg/dL — ABNORMAL HIGH (ref 0.44–1.00)
GFR calc Af Amer: 15 mL/min — ABNORMAL LOW (ref >60)
GFR, EST NON AFRICAN AMERICAN: 13 mL/min — AB (ref >60)
GLUCOSE: 108 mg/dL — AB (ref 65–99)
Potassium: 4 mmol/L (ref 3.5–5.1)
SODIUM: 141 mmol/L (ref 135–145)

## 2018-06-14 LAB — CBC
HCT: 30.3 % — ABNORMAL LOW (ref 36.0–46.0)
Hemoglobin: 9.9 g/dL — ABNORMAL LOW (ref 12.0–15.0)
MCH: 32.5 pg (ref 26.0–34.0)
MCHC: 32.7 g/dL (ref 30.0–36.0)
MCV: 99.3 fL (ref 78.0–100.0)
PLATELETS: 131 10*3/uL — AB (ref 150–400)
RBC: 3.05 MIL/uL — AB (ref 3.87–5.11)
RDW: 14.2 % (ref 11.5–15.5)
WBC: 11.3 10*3/uL — ABNORMAL HIGH (ref 4.0–10.5)

## 2018-06-14 LAB — MAGNESIUM: MAGNESIUM: 1.9 mg/dL (ref 1.7–2.4)

## 2018-06-14 LAB — CULTURE, BLOOD (ROUTINE X 2)
Culture: NO GROWTH
Special Requests: ADEQUATE

## 2018-06-14 MED ORDER — FUROSEMIDE 10 MG/ML IJ SOLN
20.0000 mg | Freq: Two times a day (BID) | INTRAMUSCULAR | Status: AC
  Administered 2018-06-14: 20 mg via INTRAVENOUS
  Filled 2018-06-14: qty 2

## 2018-06-14 NOTE — Progress Notes (Signed)
PROGRESS NOTE    [name]  XVQ:[id number] DOB: 01-Apr-1915 DOA: 06/09/2018 PCP: Leanna Battles, MD   Brief Narrative:  [name] is a 84 y.o. female with medical history significant of DM; hypothyroidism; HTN; and CKD stage 3 presenting with a fall at SNF.  She thinks she got up and had a mechanical fall.  Her leg was clearly broken with an open and very dislocated fracture.    PDX was consulted and patient underwent surgery 06/09/2018.  Urinalysis done concerning for UTI.  Chest x-ray done concerning for pneumonia.     Assessment & Plan:   Principal Problem:   Tibia and fibula open fracture, right Active Problems:   Aphasia   Hypothyroidism   Chronic kidney disease, stage III (moderate) (HCC)   Controlled diabetes mellitus with diabetic neuropathy, with long-term current use of insulin (HCC)   Unspecified atrial fibrillation (HCC)   Essential hypertension   HCAP (healthcare-associated pneumonia)   Acute lower UTI   Postoperative anemia  #1 grade 3B open right tibia-fibula fracture Secondary to mechanical fall.  Patient status post I&D of open right tibia fracture including removal of bone, IM nailing of right tibia, complex partial closure right tibial traumatic wound and skin tears, infuse allografting, placement of small wound VAC and antibiotic beads per orthopedics.  PT/OT.  Per orthopedics.  #2  Acute encephalopathy Patient suddenly nonverbal the morning of 06/12/2018 per RN after starting to eat her breakfast.  Assessed patient patient not responding to questions/nonverbal/aphasia.  Patient following commands and moving extremities spontaneously.  Sensation seems intact.  Patient refusing reflexes to be assessed.  No facial droop.  Patient with recent surgery, on admission was noted to have A. fib however was rate controlled.   Concern for an acute CVA and as such CT head was done which was negative for any acute abnormalities.  Patient improved clinically and  answering questions appropriately and verbal later on during the day of 06/12/2018 when his son came to visit her.  Neurology assessed the patient and felt acute encephalopathy was likely metabolic encephalopathy.  Patient has been reassessed by speech therapy and diet resumed.  Work-up so far has been unremarkable.  Patient with some coarse breath sounds and given a dose of IV Lasix on 06/13/2018 with a urine output of 1.850 L.  Patient also noted to have some bibasilar crackles today.  We will give Lasix 20 mg IV every 12 hours x2 doses.  Follow urine output.  IV cefepime has been discontinued and patient now on oral Vantin to complete course of antibiotic treatment for probable healthcare associated pneumonia.    3.  HCAP Noted on chest x-ray admission.  Patient noted to improved expiratory wheezing after diuresis.  Patient with some bibasilar crackles noted on examination.  Sputum Gram stain and culture pending.  Blood cultures pending with no growth to date.  Urine strep pneumococcus antigen negative.  Urine Legionella antigen negative.  IV vancomycin has been discontinued.  Discontinued IV cefepime.  Started oral Vantin to complete 7-day course of antibiotic treatment.  Patient given a dose of IV Lasix yesterday with urine output of 0.850 L over the past 24 hours.  We will give 2 more doses of IV Lasix 20 mg every 12 hours x2 doses.  Follow.  4.  Probable volume overload Patient noted to have bibasilar crackles on examination, noted to go into A. fib, received a unit of packed red blood cells.  Patient somewhat drowsy.  Patient given a  dose of Lasix 20 mg IV x1 06/13/2018 with a urine output of 1.850 L.  Check a 2D echo.  Place on Lasix 20 mg IV every 12 hours x 2 doses.  Strict I's and O's.  Daily weights.   6.  Bacteria in urine Urine cultures negative.   7.  Hypothyroidism TSH elevated at 7.129.  Free T4 within normal limits.  Continue home dose Synthroid.  Repeat thyroid function studies in  about 4 to 6 weeks.  Follow.  8.  Atrial fibrillation Noted on EKG on presentation.  Patient with no prior history of A. fib.  Patient currently rate controlled.  Beta-blocker discontinued as heart rate noted in the 50s.  Patient would be a poor candidate for anticoagulation secondary to age, fall risks.  Continue aspirin.    9.  Acute on chronic kidney disease stage III Improved.  Saline lock IV fluids.  ARB on hold.  Renal function will be trending down.   Renal ultrasound with findings consistent with chronic medical renal disease.  Moderate left hydronephrosis stable since prior CT.  Posterior bladder wall thickening similar to prior CT. patient given a dose of IV Lasix yesterday with good urine output.  Renal function trending down.  Follow.    10.  Hypertension Blood pressures elevated as patient's Norvasc and Lopressor were discontinued due to heart rate in the 50s and initial concern for CVA.  CT head negative.  Lasix 20mg  IV every 12. Resumed Norvasc.   11.  Hyperlipidemia Statin.   12.  Diabetes mellitus II Hemoglobin A1c 7.5.  CBGs 104 this morning.  Continue to hold oral hypoglycemic agents.  Continue Lantus and sliding scale insulin for now.  13.  Postop anemia Hemoglobin currently at 0.9 from 8.8 from 9.3 from 7.3 from 10.9 on day of admission.  Likely secondary to acute blood loss perioperatively.  Status post 1 unit packed red blood cells on 06/11/2018.  Patient with no overt bleeding.  Follow H&H.    DVT prophylaxis: SCDs/postop per orthopedics Code Status: DNR Family Communication: No family at bedside.  And at bedside.   Disposition Plan: Skilled nursing facility when clinically improved, medically stable and at baseline.   Consultants:   Orthopedics: Dr. Marcelino Scot 06/09/2018  Procedures:   CT head CT C-spine 06/09/2018  Films of the right ankle 06/09/2018, chest x-ray 06/09/2018  Plain films of the right tib-fib 06/09/2018  I and D of open right tibia fracture  including removal of bone/IM nailing of right tibia/complex partial closure right tibial traumatic wound and skin tears 8 cm/infuse allografting/application of small wound VAC/placement of antibiotic beads per Dr. Marcelino Scot 06/09/2018  1 unit PRBCs. 06/11/2018  Renal US 06/11/2018  Antimicrobials:   IV Cefepime 06/09/2018>>>>> 06/13/2018  IV vancomycin 06/09/2018>>>>>>06/10/2018  Vantin 06/13/2018   Subjective: Patient sitting in chair sleeping however easily arousable.  Denies any chest pain.  Some shortness of breath.  No abdominal pain.   Objective: Vitals:   06/13/18 1951 06/13/18 2305 06/14/18 0400 06/14/18 0508  BP: (!) 183/58   (!) 190/57  Pulse: 65   66  Resp:   16   Temp: 98.2 F (36.8 C)   97.7 F (36.5 C)  TempSrc: Axillary   Oral  SpO2: 94%   100%  Weight:  65.3 kg (143 lb 15.4 oz)  66.6 kg (146 lb 13.2 oz)  Height:        Intake/Output Summary (Last 24 hours) at 06/14/2018 1017 Last data filed at 06/14/2018 0904 Gross per  24 hour  Intake 200 ml  Output 1351 ml  Net -1151 ml   Filed Weights   06/12/18 0500 06/13/18 2305 06/14/18 0508  Weight: 66.2 kg (145 lb 15.1 oz) 65.3 kg (143 lb 15.4 oz) 66.6 kg (146 lb 13.2 oz)    Examination:  General exam: In chair, sleeping. Respiratory system: Bibasilar crackles.  No wheezing. Respiratory effort normal. Cardiovascular system: Irregularly irregular.  No lower extremity edema.  No murmurs rubs or gallops. Gastrointestinal system: Abdomen is nontender, nondistended, soft, positive bowel sounds.   Central nervous system: Sleeping, arousable, somewhat drowsy.  Cranial nerves II through XII grossly intact.  Moving extremities spontaneously.  4/5 bilateral upper extremity strength.  No facial asymmetry.  Right lower extremity in Ace bandage.   Extremities: Symmetric 5 x 5 power upper extremities.  Right lower extremity in Ace bandage.  4/5 left lower extremity strength. Skin: No rashes, lesions or ulcers Psychiatry: Judgement and  insight appear fair. Mood & affect appropriate.     Data Reviewed: I have personally reviewed following labs and imaging studies  CBC: Recent Labs  Lab 06/09/18 0844 06/10/18 1620 06/11/18 0557 06/11/18 1916 06/12/18 0453 06/13/18 0533 06/14/18 0429  WBC 12.6* 14.5* 10.2  --  12.1* 9.7 11.3*  NEUTROABS 9.5*  --   --   --   --   --   --   HGB 10.9* 7.8* 7.3* 8.7* 9.3* 8.8* 9.9*  HCT 33.7* 24.3* 23.0* 27.1* 28.8* 27.4* 30.3*  MCV 100.0 101.7* 100.9*  --  98.3 97.9 99.3  PLT 138* 120* 107*  --  100* 112* 086*   Basic Metabolic Panel: Recent Labs  Lab 06/10/18 1620 06/11/18 0557 06/12/18 0453 06/13/18 0533 06/14/18 0429  NA 137 138 136 140 141  K 3.8 4.9 4.7 4.6 4.0  CL 105 106 107 109 108  CO2 21* 23 20* 22 21*  GLUCOSE 56* 170* 156* 194* 108*  BUN 65* 64* 64* 67* 66*  CREATININE 2.93* 3.00* 2.96* 2.91* 2.75*  CALCIUM 9.6 9.7 10.2 10.5* 10.7*  MG 1.8  --  2.0  --  1.9   GFR: Estimated Creatinine Clearance: 10.6 mL/min (A) (by C-G formula based on SCr of 2.75 mg/dL (H)). Liver Function Tests: No results for input(s): AST, ALT, ALKPHOS, BILITOT, PROT, ALBUMIN in the last 168 hours. No results for input(s): LIPASE, AMYLASE in the last 168 hours. No results for input(s): AMMONIA in the last 168 hours. Coagulation Profile: Recent Labs  Lab 06/09/18 0844  INR 1.07   Cardiac Enzymes: No results for input(s): CKTOTAL, CKMB, CKMBINDEX, TROPONINI in the last 168 hours. BNP (last 3 results) No results for input(s): PROBNP in the last 8760 hours. HbA1C: No results for input(s): HGBA1C in the last 72 hours. CBG: Recent Labs  Lab 06/13/18 0738 06/13/18 1130 06/13/18 1620 06/13/18 2315 06/14/18 0635  GLUCAP 207* 199* 102* 92 104*   Lipid Profile: No results for input(s): CHOL, HDL, LDLCALC, TRIG, CHOLHDL, LDLDIRECT in the last 72 hours. Thyroid Function Tests: No results for input(s): TSH, T4TOTAL, FREET4, T3FREE, THYROIDAB in the last 72 hours. Anemia  Panel: No results for input(s): VITAMINB12, FOLATE, FERRITIN, TIBC, IRON, RETICCTPCT in the last 72 hours. Sepsis Labs: No results for input(s): PROCALCITON, LATICACIDVEN in the last 168 hours.  Recent Results (from the past 240 hour(s))  Culture, blood (routine x 2)     Status: None (Preliminary result)   Collection Time: 06/09/18 10:05 AM  Result Value Ref Range Status   Specimen  Description BLOOD RIGHT FOREARM  Final   Special Requests   Final    BOTTLES DRAWN AEROBIC AND ANAEROBIC Blood Culture adequate volume   Culture   Final    NO GROWTH 4 DAYS Performed at [facility], [address], [place], [place] [address]    Report Status PENDING  Incomplete  Urine Culture     Status: None   Collection Time: 06/10/18 11:04 AM  Result Value Ref Range Status   Specimen Description URINE, CLEAN CATCH  Final   Special Requests NONE  Final   Culture   Final    NO GROWTH Performed at [facility], [place] [address] [place], [place] [address]    Report Status 06/11/2018 FINAL  Final  MRSA PCR Screening     Status: None   Collection Time: 06/10/18 11:04 AM  Result Value Ref Range Status   MRSA by PCR NEGATIVE NEGATIVE Final    Comment:        The GeneXpert MRSA Assay (FDA approved for NASAL specimens only), is one component of a comprehensive MRSA colonization surveillance program. It is not intended to diagnose MRSA infection nor to guide or monitor treatment for MRSA infections. Performed at [facility], [place] [address], [place], [place] [address]          Radiology Studies: No results found.      Scheduled Meds: . acetaminophen  500 mg Oral TID  . amLODipine  5 mg Oral Daily  . aspirin EC  325 mg Oral Daily  . cefdinir  300 mg Oral Q12H  . chlorhexidine  60 mL Topical Once  . docusate sodium  100 mg Oral BID  . enoxaparin (LOVENOX) injection  30 mg Subcutaneous Q24H  . feeding supplement (ENSURE ENLIVE)  237 mL Oral Q24H  . feeding supplement  (PRO-STAT SUGAR FREE 64)  30 mL Oral BID WC  . ferrous sulfate  325 mg Oral Q breakfast  . insulin aspart  0-9 Units Subcutaneous TID WC  . insulin glargine  7 Units Subcutaneous Daily  . ipratropium  2 spray Each Nare TID AC  . levothyroxine  88 mcg Oral QAC breakfast  . montelukast  10 mg Oral Daily  . pantoprazole  40 mg Oral Daily  . polyethylene glycol  17 g Oral Daily  . povidone-iodine  2 application Topical Once  . pravastatin  20 mg Oral Daily  . pregabalin  50 mg Oral Daily  . senna-docusate  1 tablet Oral Daily   Continuous Infusions:    LOS: 5 days    Time spent: 40 minutes    Irine Seal, MD [facility] Pager [phone]  If 7PM-7AM, please contact night-coverage [email or web address] Password [facility] 06/14/2018, 10:17 AM

## 2018-06-15 ENCOUNTER — Encounter (HOSPITAL_COMMUNITY): Payer: Self-pay | Admitting: *Deleted

## 2018-06-15 ENCOUNTER — Inpatient Hospital Stay (HOSPITAL_COMMUNITY): Payer: Medicare HMO

## 2018-06-15 DIAGNOSIS — I351 Nonrheumatic aortic (valve) insufficiency: Secondary | ICD-10-CM

## 2018-06-15 LAB — GLUCOSE, CAPILLARY
GLUCOSE-CAPILLARY: 182 mg/dL — AB (ref 65–99)
Glucose-Capillary: 184 mg/dL — ABNORMAL HIGH (ref 65–99)
Glucose-Capillary: 155 mg/dL — ABNORMAL HIGH (ref 65–99)
Glucose-Capillary: 139 mg/dL — ABNORMAL HIGH (ref 65–99)

## 2018-06-15 LAB — MAGNESIUM: Magnesium: 1.8 mg/dL (ref 1.7–2.4)

## 2018-06-15 LAB — BASIC METABOLIC PANEL
ANION GAP: 10 (ref 5–15)
BUN: 63 mg/dL — ABNORMAL HIGH (ref 6–20)
CO2: 24 mmol/L (ref 22–32)
Calcium: 10.6 mg/dL — ABNORMAL HIGH (ref 8.9–10.3)
Chloride: 106 mmol/L (ref 101–111)
Creatinine, Ser: 2.95 mg/dL — ABNORMAL HIGH (ref 0.44–1.00)
GFR, EST AFRICAN AMERICAN: 14 mL/min — AB (ref >60)
GFR, EST NON AFRICAN AMERICAN: 12 mL/min — AB (ref >60)
Glucose, Bld: 180 mg/dL — ABNORMAL HIGH (ref 65–99)
Potassium: 4.2 mmol/L (ref 3.5–5.1)
Sodium: 140 mmol/L (ref 135–145)

## 2018-06-15 LAB — CBC
HCT: 30.1 % — ABNORMAL LOW (ref 36.0–46.0)
Hemoglobin: 9.8 g/dL — ABNORMAL LOW (ref 12.0–15.0)
MCH: 31.8 pg (ref 26.0–34.0)
MCHC: 32.6 g/dL (ref 30.0–36.0)
MCV: 97.7 fL (ref 78.0–100.0)
PLATELETS: 150 10*3/uL (ref 150–400)
RBC: 3.08 MIL/uL — ABNORMAL LOW (ref 3.87–5.11)
RDW: 14.1 % (ref 11.5–15.5)
WBC: 10.2 10*3/uL (ref 4.0–10.5)

## 2018-06-15 LAB — ECHOCARDIOGRAM COMPLETE
Height: 67 in
WEIGHTICAEL: 2169.33 [oz_av]

## 2018-06-15 MED ORDER — FUROSEMIDE 20 MG PO TABS
20.0000 mg | ORAL_TABLET | Freq: Every day | ORAL | Status: DC
  Administered 2018-06-15 – 2018-06-17 (×2): 20 mg via ORAL
  Filled 2018-06-15 (×3): qty 1

## 2018-06-15 MED ORDER — IPRATROPIUM-ALBUTEROL 0.5-2.5 (3) MG/3ML IN SOLN: 3.0000 mL | Freq: Two times a day (BID) | RESPIRATORY_TRACT | Status: DC | PRN

## 2018-06-15 MED ORDER — IPRATROPIUM-ALBUTEROL 20-100 MCG/ACT IN AERS: 1.0000 | INHALATION_SPRAY | Freq: Two times a day (BID) | RESPIRATORY_TRACT | Status: DC | PRN

## 2018-06-15 NOTE — Progress Notes (Signed)
  Echocardiogram 2D Echocardiogram has been performed.  Jannett Celestine 06/15/2018, 10:12 AM

## 2018-06-15 NOTE — Social Work (Signed)
CSW f/u for disposition.  CSW contacted Clarene Critchley, Chief Executive Officer at [facility]. CSW discussed status update. CSW sent her signed FL2 for review.  CSW will continue to follow as the plan is for patient to return to ALF when medically ready for rehabilitative therapies.  Elissa Hefty, LCSW Clinical Social Worker [phone]

## 2018-06-15 NOTE — Progress Notes (Signed)
Pt has been very resistant tonight. She has argued with the nurses and nurse techs who enter her room. She refused all her medications tonight and claimed the lasix would "kill her". She did agree to eat and drink a small amount of food.

## 2018-06-15 NOTE — Progress Notes (Signed)
Physical Therapy Treatment Patient Details Name: [name] MRN: [id number] DOB: 11/17/1915 Today's Date: 06/15/2018    History of Present Illness Pt is a 84 y.o. female s/p R tibial IM nail with I&D. PMHx: CKD, HTN, Hypothyroidism, DM2.    PT Comments    Pt was able to tolerate squat pivot to drop arm recliner chair again today.  She is attempting to assist more, and cooperative with therapy staff despite confusion.  PT will continue to follow acutely to encourage safe mobility progression.     Follow Up Recommendations  SNF     Equipment Recommendations  Hospital bed;Wheelchair cushion (measurements PT);Wheelchair (measurements PT);Other (comment)(hoyer lift)    Recommendations for Other Services   NA     Precautions / Restrictions Precautions Precautions: Fall Restrictions RLE Weight Bearing: Non weight bearing    Mobility  Bed Mobility Overal bed mobility: Needs Assistance Bed Mobility: Supine to Sit     Supine to sit: HOB elevated;+2 for physical assistance;Max assist     General bed mobility comments: Two person max assist to move towards EOB, support at legs and second person behind pt to support at trunk.   Transfers Overall transfer level: Needs assistance Equipment used: None Transfers: Lateral/Scoot Transfers          Lateral/Scoot Transfers: +2 physical assistance;Max assist General transfer comment: Two person max assist to squat/scoot pivot to drop arm recliner chair on pt's left side.  Assist provided to hold her right leg up and second person directing the squat using the bed pad for assist at her hips.  Pt helping by holding to therapist trunk during transition to the chair.   Ambulation/Gait             General Gait Details: unable at this time.           Balance Overall balance assessment: Needs assistance Sitting-balance support: Feet supported;Bilateral upper extremity supported Sitting balance-Leahy Scale: Poor Sitting  balance - Comments: min assist at EOB                                     Cognition Arousal/Alertness: Awake/alert Behavior During Therapy: WFL for tasks assessed/performed Overall Cognitive Status: Impaired/Different from baseline Area of Impairment: Orientation;Memory;Safety/judgement;Awareness                 Orientation Level: Disoriented to;Situation;Time Current Attention Level: Sustained Memory: Decreased short-term memory;Decreased recall of precautions Following Commands: Follows one step commands with increased time;Follows one step commands inconsistently Safety/Judgement: Decreased awareness of deficits;Decreased awareness of safety Awareness: Intellectual Problem Solving: Slow processing;Decreased initiation;Difficulty sequencing;Requires tactile cues;Requires verbal cues General Comments: Pt seems confused, responds slowly when asked to do something, but generally cooperative with therapists requests.               Pertinent Vitals/Pain Pain Assessment: Faces Faces Pain Scale: Hurts little more Pain Location: R leg Pain Descriptors / Indicators: Grimacing;Sore Pain Intervention(s): Limited activity within patient's tolerance;Monitored during session;Repositioned           PT Goals (current goals can now be found in the care plan section) Acute Rehab PT Goals Patient Stated Goal: none stated Progress towards PT goals: Progressing toward goals    Frequency    Min 3X/week      PT Plan Current plan remains appropriate       AM-PAC PT "6 Clicks" Daily Activity  Outcome Measure  Difficulty turning  over in bed (including adjusting bedclothes, sheets and blankets)?: Unable Difficulty moving from lying on back to sitting on the side of the bed? : Unable Difficulty sitting down on and standing up from a chair with arms (e.g., wheelchair, bedside commode, etc,.)?: Unable Help needed moving to and from a bed to chair (including a  wheelchair)?: A Lot Help needed walking in hospital room?: Total Help needed climbing 3-5 steps with a railing? : Total 6 Click Score: 7    End of Session   Activity Tolerance: Patient limited by pain Patient left: in chair;with call bell/phone within reach   PT Visit Diagnosis: Other abnormalities of gait and mobility (R26.89);Pain Pain - Right/Left: Right Pain - part of body: Leg     Time: 1520-1545 PT Time Calculation (min) (ACUTE ONLY): 25 min  Charges:  $Therapeutic Activity: 23-37 mins          Jonelle Bann B. [place], [place], DPT [phone]            06/15/2018, 5:35 PM

## 2018-06-15 NOTE — Progress Notes (Signed)
PROGRESS NOTE    [name]  OZH:[id number] DOB: 1915-10-21 DOA: 06/09/2018 PCP: Leanna Battles, MD   Brief Narrative:  [name] is a 84 y.o. female with medical history significant of DM; hypothyroidism; HTN; and CKD stage 3 presenting with a fall at SNF.  She thinks she got up and had a mechanical fall.  Her leg was clearly broken with an open and very dislocated fracture.    PDX was consulted and patient underwent surgery 06/09/2018.  Urinalysis done concerning for UTI.  Chest x-ray done concerning for pneumonia.     Assessment & Plan:   Principal Problem:   Tibia and fibula open fracture, right Active Problems:   Aphasia   Hypothyroidism   Chronic kidney disease, stage III (moderate) (HCC)   Controlled diabetes mellitus with diabetic neuropathy, with long-term current use of insulin (HCC)   Unspecified atrial fibrillation (HCC)   Essential hypertension   HCAP (healthcare-associated pneumonia)   Acute lower UTI   Postoperative anemia   Acute metabolic encephalopathy   Hypervolemia  #1 grade 3B open right tibia-fibula fracture Secondary to mechanical fall.  Patient status post I&D of open right tibia fracture including removal of bone, IM nailing of right tibia, complex partial closure right tibial traumatic wound and skin tears, infuse allografting, placement of small wound VAC and antibiotic beads per orthopedics.  PT/OT.  Per orthopedics.  #2  Acute encephalopathy Patient suddenly nonverbal the morning of 06/12/2018 per RN after starting to eat her breakfast.  Assessed patient patient not responding to questions/nonverbal/aphasia.  Patient following commands and moving extremities spontaneously.  Sensation seems intact.  Patient refusing reflexes to be assessed.  No facial droop.  Patient with recent surgery, on admission was noted to have A. fib however was rate controlled.   Concern for an acute CVA and as such CT head was done which was negative for any acute  abnormalities.  Patient improved clinically and answering questions appropriately and verbal later on during the day of 06/12/2018 when his son came to visit her.  Neurology assessed the patient and felt acute encephalopathy was likely metabolic encephalopathy.  Patient has been reassessed by speech therapy and diet resumed.  Work-up so far has been unremarkable.  Patient with some coarse breath sounds and given a dose of IV Lasix on 06/13/2018 with a urine output of 1.850 L.  Patient also noted to have some bibasilar crackles 06/14/2018 and placed on IV Lasix with good urine output and clinical improvement.  IV cefepime has been discontinued and patient now on oral Vantin to complete course of antibiotic treatment for probable healthcare associated pneumonia.    3.  HCAP Noted on chest x-ray admission.  Patient noted to improved expiratory wheezing after diuresis.  Patient with some bibasilar crackles noted on examination.  Sputum Gram stain and culture pending.  Blood cultures pending with no growth to date.  Urine strep pneumococcus antigen negative.  Urine Legionella antigen negative.  IV vancomycin has been discontinued.  Discontinued IV cefepime.  Started oral Vantin to complete 7-day course of antibiotic treatment.  Patient given the Lasix with good urine output and clinical improvement.  Continue oral antibiotics.  Follow.    4.  Probable volume overload Patient noted to have bibasilar crackles on examination on 06/14/2018, noted to go into A. fib, received a unit of packed red blood cells.  Patient alert today.  Patient given a dose of Lasix 20 mg IV x1 06/13/2018 with a urine output of 1.850  L.  2D echo with a EF of 60 to 65% with mild aortic valvular stenosis, mildly dilated left atrium.  Patient placed on Lasix 20 mg IV every 12 hours x2 doses with good urine output.  Patient improved clinically and likely at baseline.  Discontinue IV Lasix and placed back on home dose oral Lasix.    6.  Bacteria in  urine Urine cultures negative.   7.  Hypothyroidism TSH elevated at 7.129.  Free T4 within normal limits.  Continue home dose Synthroid.  Repeat thyroid function studies in about 4 to 6 weeks.  Follow.  8.  Atrial fibrillation Noted on EKG on presentation.  Patient with no prior history of A. fib.  Patient currently rate controlled.  Beta-blocker discontinued as heart rate noted in the 50s.  Patient would be a poor candidate for anticoagulation secondary to age, fall risks.  Continue aspirin.    9.  Acute on chronic kidney disease stage III Improved.  Saline lock IV fluids.  ARB on hold.  Renal function fluctuating however stable.    Renal ultrasound with findings consistent with chronic medical renal disease.  Moderate left hydronephrosis stable since prior CT.  Posterior bladder wall thickening similar to prior CT. patient given a dose of IV Lasix on 06/13/2018, with good urine output.  Transition back to home dose oral Lasix.  Follow.     10.  Hypertension Blood pressures elevated as patient's Norvasc and Lopressor were discontinued due to heart rate in the 50s and initial concern for CVA.  CT head negative.  Lasix 20mg  IV every 12.  Continue Norvasc.  11.  Hyperlipidemia Statin.   12.  Diabetes mellitus II Hemoglobin A1c 7.5.  CBGs 182 this morning.  Continue to hold oral hypoglycemic agents.  Continue Lantus and sliding scale insulin for now.  13.  Postop anemia Hemoglobin currently at 9.8 from 8.8 from 9.3 from 7.3 from 10.9 on day of admission.  Likely secondary to acute blood loss perioperatively.  Status post 1 unit packed red blood cells on 06/11/2018.  Patient with no overt bleeding.  Follow H&H.    DVT prophylaxis: SCDs/postop per orthopedics Code Status: DNR Family Communication: No family at bedside.  And at bedside.   Disposition Plan: Skilled nursing facility when clinically improved, medically stable and at baseline in the next 24 to 48 hours..   Consultants:    Orthopedics: Dr. Marcelino Scot 06/09/2018  Procedures:   CT head CT C-spine 06/09/2018  Films of the right ankle 06/09/2018, chest x-ray 06/09/2018  Plain films of the right tib-fib 06/09/2018  I and D of open right tibia fracture including removal of bone/IM nailing of right tibia/complex partial closure right tibial traumatic wound and skin tears 8 cm/infuse allografting/application of small wound VAC/placement of antibiotic beads per Dr. Marcelino Scot 06/09/2018  1 unit PRBCs. 06/11/2018  Renal US 06/11/2018  2D echo 06/15/2018  Antimicrobials:   IV Cefepime 06/09/2018>>>>> 06/13/2018  IV vancomycin 06/09/2018>>>>>>06/10/2018  Vantin 06/13/2018>>>>>> 06/16/2018   Subjective: Patient sitting in bed.  Family at bedside.  Denies any chest pain.  Denies any shortness of breath.  Poor oral intake per family.    Objective: Vitals:   06/14/18 1603 06/14/18 1738 06/14/18 1916 06/15/18 0330  BP: (!) 160/45  (!) 155/48 (!) 171/49  Pulse: 69  60 66  Resp:      Temp: 97.7 F (36.5 C)  98.4 F (36.9 C) 97.6 F (36.4 C)  TempSrc: Oral  Axillary Axillary  SpO2: 97%  93%  93%  Weight:  69 kg (152 lb 1.9 oz)  61.5 kg (135 lb 9.3 oz)  Height:        Intake/Output Summary (Last 24 hours) at 06/15/2018 1312 Last data filed at 06/15/2018 0830 Gross per 24 hour  Intake 300 ml  Output 1120 ml  Net -820 ml   Filed Weights   06/14/18 0508 06/14/18 1738 06/15/18 0330  Weight: 66.6 kg (146 lb 13.2 oz) 69 kg (152 lb 1.9 oz) 61.5 kg (135 lb 9.3 oz)    Examination:  General exam: Alert. Respiratory system: CTA B anterior lung fields.  No wheezing, no crackles, no rhonchi.  Respiratory effort normal. Cardiovascular system: Irregularly irregular.  No lower extremity edema.  No murmurs rubs or gallops. Gastrointestinal system: Abdomen is soft, nontender, nondistended, positive bowel sounds.   Central nervous system: Alert.  No focal deficits noted.  Moving extremities spontaneously.   Right lower extremity in  Ace bandage.   Extremities: Symmetric 5 x 5 power upper extremities.  Right lower extremity in Ace bandage.  4/5 left lower extremity strength. Skin: No rashes, lesions or ulcers Psychiatry: Judgement and insight appear fair. Mood & affect appropriate.     Data Reviewed: I have personally reviewed following labs and imaging studies  CBC: Recent Labs  Lab 06/09/18 0844  06/11/18 0557 06/11/18 1916 06/12/18 0453 06/13/18 0533 06/14/18 0429 06/15/18 0222  WBC 12.6*   < > 10.2  --  12.1* 9.7 11.3* 10.2  NEUTROABS 9.5*  --   --   --   --   --   --   --   HGB 10.9*   < > 7.3* 8.7* 9.3* 8.8* 9.9* 9.8*  HCT 33.7*   < > 23.0* 27.1* 28.8* 27.4* 30.3* 30.1*  MCV 100.0   < > 100.9*  --  98.3 97.9 99.3 97.7  PLT 138*   < > 107*  --  100* 112* 131* 150   < > = values in this interval not displayed.   Basic Metabolic Panel: Recent Labs  Lab 06/10/18 1620 06/11/18 0557 06/12/18 0453 06/13/18 0533 06/14/18 0429 06/15/18 0222  NA 137 138 136 140 141 140  K 3.8 4.9 4.7 4.6 4.0 4.2  CL 105 106 107 109 108 106  CO2 21* 23 20* 22 21* 24  GLUCOSE 56* 170* 156* 194* 108* 180*  BUN 65* 64* 64* 67* 66* 63*  CREATININE 2.93* 3.00* 2.96* 2.91* 2.75* 2.95*  CALCIUM 9.6 9.7 10.2 10.5* 10.7* 10.6*  MG 1.8  --  2.0  --  1.9 1.8   GFR: Estimated Creatinine Clearance: 9.8 mL/min (A) (by C-G formula based on SCr of 2.95 mg/dL (H)). Liver Function Tests: No results for input(s): AST, ALT, ALKPHOS, BILITOT, PROT, ALBUMIN in the last 168 hours. No results for input(s): LIPASE, AMYLASE in the last 168 hours. No results for input(s): AMMONIA in the last 168 hours. Coagulation Profile: Recent Labs  Lab 06/09/18 0844  INR 1.07   Cardiac Enzymes: No results for input(s): CKTOTAL, CKMB, CKMBINDEX, TROPONINI in the last 168 hours. BNP (last 3 results) No results for input(s): PROBNP in the last 8760 hours. HbA1C: No results for input(s): HGBA1C in the last 72 hours. CBG: Recent Labs  Lab  06/14/18 1229 06/14/18 1637 06/14/18 2035 06/15/18 0644 06/15/18 1148  GLUCAP 172* 217* 146* 182* 184*   Lipid Profile: No results for input(s): CHOL, HDL, LDLCALC, TRIG, CHOLHDL, LDLDIRECT in the last 72 hours. Thyroid Function Tests: No results for  input(s): TSH, T4TOTAL, FREET4, T3FREE, THYROIDAB in the last 72 hours. Anemia Panel: No results for input(s): VITAMINB12, FOLATE, FERRITIN, TIBC, IRON, RETICCTPCT in the last 72 hours. Sepsis Labs: No results for input(s): PROCALCITON, LATICACIDVEN in the last 168 hours.  Recent Results (from the past 240 hour(s))  Culture, blood (routine x 2)     Status: None   Collection Time: 06/09/18 10:05 AM  Result Value Ref Range Status   Specimen Description BLOOD RIGHT FOREARM  Final   Special Requests   Final    BOTTLES DRAWN AEROBIC AND ANAEROBIC Blood Culture adequate volume   Culture   Final    NO GROWTH 5 DAYS Performed at [facility], [address], [place], [place] [address]    Report Status 06/14/2018 FINAL  Final  Urine Culture     Status: None   Collection Time: 06/10/18 11:04 AM  Result Value Ref Range Status   Specimen Description URINE, CLEAN CATCH  Final   Special Requests NONE  Final   Culture   Final    NO GROWTH Performed at [facility], [place] [address] [place], [place] [address]    Report Status 06/11/2018 FINAL  Final  MRSA PCR Screening     Status: None   Collection Time: 06/10/18 11:04 AM  Result Value Ref Range Status   MRSA by PCR NEGATIVE NEGATIVE Final    Comment:        The GeneXpert MRSA Assay (FDA approved for NASAL specimens only), is one component of a comprehensive MRSA colonization surveillance program. It is not intended to diagnose MRSA infection nor to guide or monitor treatment for MRSA infections. Performed at [facility], [place] [address] [place], [place] [address]          Radiology Studies: No results found.      Scheduled Meds: . acetaminophen  500  mg Oral TID  . amLODipine  5 mg Oral Daily  . aspirin EC  325 mg Oral Daily  . cefdinir  300 mg Oral Q12H  . chlorhexidine  60 mL Topical Once  . docusate sodium  100 mg Oral BID  . enoxaparin (LOVENOX) injection  30 mg Subcutaneous Q24H  . feeding supplement (ENSURE ENLIVE)  237 mL Oral Q24H  . feeding supplement (PRO-STAT SUGAR FREE 64)  30 mL Oral BID WC  . ferrous sulfate  325 mg Oral Q breakfast  . furosemide  20 mg Oral Daily  . insulin aspart  0-9 Units Subcutaneous TID WC  . insulin glargine  7 Units Subcutaneous Daily  . ipratropium  2 spray Each Nare TID AC  . levothyroxine  88 mcg Oral QAC breakfast  . montelukast  10 mg Oral Daily  . pantoprazole  40 mg Oral Daily  . polyethylene glycol  17 g Oral Daily  . povidone-iodine  2 application Topical Once  . pravastatin  20 mg Oral Daily  . pregabalin  50 mg Oral Daily  . senna-docusate  1 tablet Oral Daily   Continuous Infusions:    LOS: 6 days    Time spent: 40 minutes    Irine Seal, MD [facility] Pager [phone]  If 7PM-7AM, please contact night-coverage [email or web address] Password [email or web address] 06/15/2018, 1:12 PM

## 2018-06-15 NOTE — Progress Notes (Signed)
  Speech Language Pathology Treatment: Dysphagia  Patient Details Name: [name] MRN: [id number] DOB: 29-Jan-1915 Today's Date: 06/15/2018 Time: 2993-7169 SLP Time Calculation (min) (ACUTE ONLY): 8 min  Assessment / Plan / Recommendation Clinical Impression  Pt has tolerated very limited intake of PO with staff. SLP tried to encourage pt to take sips of "chocolate milk" (Ensure), pt verbalized "I cant trust you" but attempted a sip via straw, with minimal effort but didn't get any liquid. Then said she didn't really want any. No further SLP interventions are warranted as there is no evidence of dysphagia to explain poor intake. Appears to be a combination of lack of appetite and poor cognition. Would recommend pt continue current liberal diet to allow pt food and drink of choice with basic aspiration precautions. Will sign off.   HPI HPI: [name] is a 84 y.o. female with medical history significant of DM; hypothyroidism; HTN; and CKD stage 3 presenting with a fall at SNF.  She thinks she got up and had a mechanical fall.  Her leg was clearly broken with an open and very dislocated fracture.   She then had an episode of new onset left sided facial droop and left arm weakness.  CT head is showing no evidence of acute abnormality.  Most recent CXR is showing persistant atelectasis/infiltrate in the RUL and LLL as well as small pleural effusions which are slightly increased from prior exam.        SLP Plan  Discharge SLP treatment due to (comment)       Recommendations  Diet recommendations: Regular;Thin liquid Liquids provided via: Cup;Straw Medication Administration: Whole meds with liquid Supervision: Staff to assist with self feeding;Full supervision/cueing for compensatory strategies Compensations: Slow rate;Small sips/bites Postural Changes and/or Swallow Maneuvers: Seated upright 90 degrees                Plan: Discharge SLP treatment due to (comment)        GO               Herbie Baltimore, MA CCC-SLP [phone]  Arthurine Oleary, Katherene Ponto 06/15/2018, 3:16 PM

## 2018-06-15 NOTE — Progress Notes (Signed)
Orthopedic Trauma Service Progress Note   Patient ID: [name] MRN: [id number] DOB/AGE: 01/23/1915 84 y.o.  Subjective:  Pt sleeping  Did open eyes briefly   Went to elevate her operative leg up on a pillow and she resisted with quite a bit of force   ROS As above  Objective:   VITALS:   Vitals:   06/14/18 1603 06/14/18 1738 06/14/18 1916 06/15/18 0330  BP: (!) 160/45  (!) 155/48 (!) 171/49  Pulse: 69  60 66  Resp:      Temp: 97.7 F (36.5 C)  98.4 F (36.9 C) 97.6 F (36.4 C)  TempSrc: Oral  Axillary Axillary  SpO2: 97%  93% 93%  Weight:  69 kg (152 lb 1.9 oz)  61.5 kg (135 lb 9.3 oz)  Height:        Estimated body mass index is 21.24 kg/m as calculated from the following:   Height as of this encounter: 5\' 7"  (1.702 m).   Weight as of this encounter: 61.5 kg (135 lb 9.3 oz).   Intake/Output      06/16 0701 - 06/17 0700 06/17 0701 - 06/18 0700   P.O. 450    Total Intake(mL/kg) 450 (7.3)    Urine (mL/kg/hr) 500 (0.3)    Drains 0    Stool 1    Total Output 501    Net -51         Urine Occurrence 2 x    Stool Occurrence 1 x      LABS  Results for orders placed or performed during the hospital encounter of 06/09/18 (from the past 24 hour(s))  Glucose, capillary     Status: Abnormal   Collection Time: 06/14/18 12:29 PM  Result Value Ref Range   Glucose-Capillary 172 (H) 65 - 99 mg/dL  Glucose, capillary     Status: Abnormal   Collection Time: 06/14/18  4:37 PM  Result Value Ref Range   Glucose-Capillary 217 (H) 65 - 99 mg/dL  Glucose, capillary     Status: Abnormal   Collection Time: 06/14/18  8:35 PM  Result Value Ref Range   Glucose-Capillary 146 (H) 65 - 99 mg/dL   Comment 1 Document in Chart   CBC     Status: Abnormal   Collection Time: 06/15/18  2:22 AM  Result Value Ref Range   WBC 10.2 4.0 - 10.5 K/uL   RBC 3.08 (L) 3.87 - 5.11 MIL/uL   Hemoglobin 9.8 (L) 12.0 - 15.0 g/dL   HCT 30.1 (L) 36.0 - 46.0 %   MCV  97.7 78.0 - 100.0 fL   MCH 31.8 26.0 - 34.0 pg   MCHC 32.6 30.0 - 36.0 g/dL   RDW 14.1 11.5 - 15.5 %   Platelets 150 150 - 400 K/uL  Basic metabolic panel     Status: Abnormal   Collection Time: 06/15/18  2:22 AM  Result Value Ref Range   Sodium 140 135 - 145 mmol/L   Potassium 4.2 3.5 - 5.1 mmol/L   Chloride 106 101 - 111 mmol/L   CO2 24 22 - 32 mmol/L   Glucose, Bld 180 (H) 65 - 99 mg/dL   BUN 63 (H) 6 - 20 mg/dL   Creatinine, Ser 2.95 (H) 0.44 - 1.00 mg/dL   Calcium 10.6 (H) 8.9 - 10.3 mg/dL   GFR calc non Af Amer 12 (L) >60 mL/min   GFR calc Af Amer 14 (L) >60 mL/min   Anion gap 10 5 - 15  Magnesium     Status: None   Collection Time: 06/15/18  2:22 AM  Result Value Ref Range   Magnesium 1.8 1.7 - 2.4 mg/dL  Glucose, capillary     Status: Abnormal   Collection Time: 06/15/18  6:44 AM  Result Value Ref Range   Glucose-Capillary 182 (H) 65 - 99 mg/dL   Comment 1 Document in Chart      PHYSICAL EXAM:   Gen: NAD Ext:       Right Lower extremity              Dressing stable             Vac functioning             Ext warm             Swelling controlled             resisted forcefully when trying to elevate leg on pillow   Pt did move all toes     Assessment/Plan: 6 Days Post-Op   Principal Problem:   Tibia and fibula open fracture, right Active Problems:   Hypothyroidism   Chronic kidney disease, stage III (moderate) (HCC)   Controlled diabetes mellitus with diabetic neuropathy, with long-term current use of insulin (HCC)   Unspecified atrial fibrillation (HCC)   Essential hypertension   HCAP (healthcare-associated pneumonia)   Acute lower UTI   Postoperative anemia   Aphasia   Acute metabolic encephalopathy   Hypervolemia   Anti-infectives (From admission, onward)   Start     Dose/Rate Route Frequency Ordered Stop   06/13/18 1000  cefdinir (OMNICEF) capsule 300 mg  Status:  Discontinued     300 mg Oral Every 12 hours 06/13/18 0845 06/13/18 0909    06/13/18 1000  cefdinir (OMNICEF) capsule 300 mg     300 mg Oral Every 12 hours 06/13/18 0911     06/11/18 1330  ceFEPIme (MAXIPIME) 1 g in sodium chloride 0.9 % 100 mL IVPB  Status:  Discontinued     1 g 200 mL/hr over 30 Minutes Intravenous Every 24 hours 06/11/18 0915 06/13/18 0845   06/11/18 1100  vancomycin (VANCOCIN) IVPB 1000 mg/200 mL premix  Status:  Discontinued     1,000 mg 200 mL/hr over 60 Minutes Intravenous Every 48 hours 06/09/18 0954 06/09/18 1842   06/11/18 1000  vancomycin (VANCOCIN) IVPB 1000 mg/200 mL premix  Status:  Discontinued     1,000 mg 200 mL/hr over 60 Minutes Intravenous Every 48 hours 06/10/18 0945 06/10/18 2119   06/11/18 1000  amoxicillin-clavulanate (AUGMENTIN) 875-125 MG per tablet 1 tablet  Status:  Discontinued     1 tablet Oral Every 12 hours 06/11/18 0912 06/11/18 0915   06/10/18 1000  ceFAZolin (ANCEF) IVPB 1 g/50 mL premix  Status:  Discontinued     1 g 100 mL/hr over 30 Minutes Intravenous Every 24 hours 06/09/18 1905 06/10/18 0937   06/10/18 1000  ceFEPIme (MAXIPIME) 1 g in sodium chloride 0.9 % 100 mL IVPB  Status:  Discontinued     1 g 200 mL/hr over 30 Minutes Intravenous Every 24 hours 06/10/18 0907 06/11/18 0912   06/09/18 1234  vancomycin (VANCOCIN) powder  Status:  Discontinued       As needed 06/09/18 1234 06/09/18 1348   06/09/18 1233  tobramycin (NEBCIN) injection  Status:  Discontinued       As needed 06/09/18 1233 06/09/18 1348   06/09/18 1015  ceFAZolin (ANCEF) IVPB 2g/100  mL premix     2 g 200 mL/hr over 30 Minutes Intravenous On call to O.R. 06/09/18 1011 06/09/18 1143   06/09/18 1000  vancomycin (VANCOCIN) IVPB 1000 mg/200 mL premix     1,000 mg 200 mL/hr over 60 Minutes Intravenous  Once 06/09/18 0949 06/09/18 1134   06/09/18 0945  ceFEPIme (MAXIPIME) 2 g in sodium chloride 0.9 % 100 mL IVPB  Status:  Discontinued     2 g 200 mL/hr over 30 Minutes Intravenous  Once 06/09/18 0939 06/09/18 1842    .  POD/HD#: 92  84 y/o  female s/p fall with grade 3 b open R tibia and fibula fracture    -fall   - Grade 3B open R tibia and fibula fracture s/p IMN             NWB R leg             Wound vac on complex wound                         Due to age and associated comorbid conditions pt is not a candidate for flap coverage                         will remove vac tomorrow am to eval traumatic wound     Hopeful to transition to wet to dry dressings but may need to go back with VAC    Will make NPO after MN in the event her wound does not look good                PT/OT             cycle night splint on and off every 2 hours. When not on float heel off bed              Knee ROM as tolerated    - Pain management:             Tylenol             Minimize narcotics so as not to exacerbate confusion    - ABL anemia/Hemodynamics             cbc stable    - Medical issues              Per primary                         actue renal failure                DM                         Tight sugar control to minimize complications                          Goal is for cbgs to be <200 mg/dL   - DVT/PE prophylaxis:             ASA 325 daily    - ID:              Ancef x 72 hours    - Metabolic Bone Disease:             + osteoporosis   - Activity:             Up  with assistance   - FEN/GI prophylaxis/Foley/Lines:             Carb mod diet    - Impediments to fracture healing:             Open fracture              DM    - Dispo:             continue with inpatient care     Jari Pigg, PA-C [facility] [phone] Levi Aland (C) 06/15/2018, 10:14 AM

## 2018-06-16 LAB — GLUCOSE, CAPILLARY
GLUCOSE-CAPILLARY: 188 mg/dL — AB (ref 65–99)
GLUCOSE-CAPILLARY: 181 mg/dL — AB (ref 65–99)
Glucose-Capillary: 143 mg/dL — ABNORMAL HIGH (ref 65–99)

## 2018-06-16 LAB — CBC
HCT: 31.4 % — ABNORMAL LOW (ref 36.0–46.0)
Hemoglobin: 9.9 g/dL — ABNORMAL LOW (ref 12.0–15.0)
MCH: 31.3 pg (ref 26.0–34.0)
MCHC: 31.5 g/dL (ref 30.0–36.0)
MCV: 99.4 fL (ref 78.0–100.0)
PLATELETS: 152 10*3/uL (ref 150–400)
RBC: 3.16 MIL/uL — ABNORMAL LOW (ref 3.87–5.11)
RDW: 13.9 % (ref 11.5–15.5)
WBC: 11.4 10*3/uL — AB (ref 4.0–10.5)

## 2018-06-16 LAB — BASIC METABOLIC PANEL
ANION GAP: 11 (ref 5–15)
BUN: 60 mg/dL — ABNORMAL HIGH (ref 6–20)
CALCIUM: 10.9 mg/dL — AB (ref 8.9–10.3)
CO2: 24 mmol/L (ref 22–32)
CREATININE: 2.83 mg/dL — AB (ref 0.44–1.00)
Chloride: 107 mmol/L (ref 101–111)
GFR, EST AFRICAN AMERICAN: 15 mL/min — AB (ref >60)
GFR, EST NON AFRICAN AMERICAN: 13 mL/min — AB (ref >60)
GLUCOSE: 195 mg/dL — AB (ref 65–99)
Potassium: 4 mmol/L (ref 3.5–5.1)
Sodium: 142 mmol/L (ref 135–145)

## 2018-06-16 NOTE — Progress Notes (Signed)
1915 Bedside shift report, pt sleeping, easy to arouse. NAD, fall precautions in place, WCTM.   2000 Pt assessed, see flow sheet. RN attempted to medicate pt, pt refused pushing RN hand with spoon away. Pt repositioned, had BM, peri care performed and bed pad changed. RN attempted to feed pt some dinner, mashed potatoes and gravy, pt again refusing. Pt combative with NT when she was obtaining BP and blood sugar. Swinging arms and stating to, "leave me alone." Reoriented pt but unsuccessful. Dressing to right LE, CDI and prevalon boot intact. Bilateral foot off loaded with pillow. Fall precautions in place, [place].   2200 Pt turned and repositioned, tolerated well. WCTM.

## 2018-06-16 NOTE — Progress Notes (Signed)
PROGRESS NOTE    [name]  PPI:[id number] DOB: 07/26/1915 DOA: 06/09/2018 PCP: Leanna Battles, MD   Brief Narrative:  [name] is a 84 y.o. female with medical history significant of DM; hypothyroidism; HTN; and CKD stage 3 presenting with a fall at SNF.  She thinks she got up and had a mechanical fall.  Her leg was clearly broken with an open and very dislocated fracture.    PDX was consulted and patient underwent surgery 06/09/2018.  Urinalysis done concerning for UTI.  Chest x-ray done concerning for pneumonia.     Assessment & Plan:   Principal Problem:   Tibia and fibula open fracture, right Active Problems:   Aphasia   Hypothyroidism   Chronic kidney disease, stage III (moderate) (HCC)   Controlled diabetes mellitus with diabetic neuropathy, with long-term current use of insulin (HCC)   Unspecified atrial fibrillation (HCC)   Essential hypertension   HCAP (healthcare-associated pneumonia)   Acute lower UTI   Postoperative anemia   Acute metabolic encephalopathy   Hypervolemia  #1 grade 3B open right tibia-fibula fracture Secondary to mechanical fall.  Patient status post I&D of open right tibia fracture including removal of bone, IM nailing of right tibia, complex partial closure right tibial traumatic wound and skin tears, infuse allografting, placement of small wound VAC and antibiotic beads per orthopedics.  PT/OT.  Wound VAC was removed today 06/16/2018 with significant drainage noted per orthopedics.  Patient now on wet-to-dry dressing changes and orthopedics want to monitor wound site and drainage over the next 24 to 48 hours.  Per orthopedics.  #2  Acute encephalopathy Patient suddenly nonverbal the morning of 06/12/2018 per RN after starting to eat her breakfast.  Assessed patient patient on 06/12/2018, not responding to questions/nonverbal/aphasia.  Patient following commands and moving extremities spontaneously.  Sensation was intact.  Patient refusing  reflexes to be assessed.  No facial droop.  Patient with recent surgery, on admission was noted to have A. fib however was rate controlled.   Concern for an acute CVA and as such CT head was done which was negative for any acute abnormalities.  Patient improved clinically and answering questions appropriately and verbal later on during the day of 06/12/2018 when her son came to visit her.  Neurology assessed the patient and felt acute encephalopathy was likely secondary to metabolic encephalopathy.  Patient has been reassessed by speech therapy and diet resumed.  Work-up so far has been unremarkable.  Patient with some coarse breath sounds and given a dose of IV Lasix on 06/13/2018 with a urine output of 1.850 L.  Patient also noted to have some bibasilar crackles 06/14/2018 and placed on IV Lasix with good urine output and clinical improvement.  IV cefepime has been discontinued and patient now on oral Vantin to complete course of antibiotic treatment for probable healthcare associated pneumonia.  IV diuretics have been transitioned to home dose oral Lasix.  Patient likely close to baseline at this time.  3.  HCAP Noted on chest x-ray admission.  Patient noted to have improved expiratory wheezing after diuresis. Sputum Gram stain and culture pending.  Blood cultures x5 days.  Urine strep pneumococcus antigen negative.  Urine Legionella antigen negative.  IV vancomycin has been discontinued.  Discontinued IV cefepime.  Started oral Vantin to complete 7-day course of antibiotic treatment.  Patient given the Lasix with good urine output and clinical improvement.  Discontinue oral antibiotics today.  Follow.    4.  Probable volume overload  Patient noted to have bibasilar crackles on examination on 06/14/2018, noted to go into A. fib, received a unit of packed red blood cells.  Patient alert today.  Patient given a dose of Lasix 20 mg IV x1 06/13/2018 with a urine output of 1.850 L.  2D echo with a EF of 60 to 65%  with mild aortic valvular stenosis, mildly dilated left atrium.  Patient placed on Lasix 20 mg IV every 12 hours x2 doses with good urine output.  Patient improved clinically and likely at baseline.  IV Lasix have been transitioned to home dose oral Lasix.     6.  Bacteria in urine Urine cultures negative.   7.  Hypothyroidism TSH elevated at 7.129.  Free T4 within normal limits.  Continue home dose Synthroid.  Repeat thyroid function studies in about 4 to 6 weeks.  Follow.  8.  Atrial fibrillation Noted on EKG on presentation.  Patient with no prior history of A. fib.  Patient currently rate controlled.  Beta-blocker discontinued as heart rate noted in the 50s.  Patient would be a poor candidate for anticoagulation secondary to age, fall risks.  Continue aspirin.    9.  Acute on chronic kidney disease stage III Improved.  Saline lock IV fluids.  ARB on hold.  Renal function fluctuating however stable.    Renal ultrasound with findings consistent with chronic medical renal disease.  Moderate left hydronephrosis stable since prior CT.  Posterior bladder wall thickening similar to prior CT. patient given a dose of IV Lasix on 06/13/2018, with good urine output.  Transition back to home dose oral Lasix.  Follow.     10.  Hypertension Blood pressures was initially elevated as patient's Norvasc and Lopressor were discontinued due to heart rate in the 50s and initial concern for CVA.  CT head negative.  Blood pressure currently improved.  Continue current regimen of Norvasc and home dose Lasix.    11.  Hyperlipidemia Continue statin.   12.  Diabetes mellitus II Hemoglobin A1c 7.5.  CBGs 188 this morning.  Continue to hold oral hypoglycemic agents.  Continue Lantus and sliding scale insulin for now.  13.  Postop anemia Hemoglobin currently at 9.9 from 9.8 from 8.8 from 9.3 from 7.3 from 10.9 on day of admission.  Likely secondary to acute blood loss perioperatively.  Status post 1 unit packed red  blood cells on 06/11/2018.  Patient with no overt bleeding.  Follow H&H.    DVT prophylaxis: SCDs/postop per orthopedics Code Status: DNR Family Communication: Updated son at bedside.   Disposition Plan: Skilled nursing facility when clinically improved, medically stable and per orthopedics.  Hopefully in the next 24 to 48 hours.     Consultants:   Orthopedics: Dr. Marcelino Scot 06/09/2018  Procedures:   CT head CT C-spine 06/09/2018  Films of the right ankle 06/09/2018, chest x-ray 06/09/2018  Plain films of the right tib-fib 06/09/2018  I and D of open right tibia fracture including removal of bone/IM nailing of right tibia/complex partial closure right tibial traumatic wound and skin tears 8 cm/infuse allografting/application of small wound VAC/placement of antibiotic beads per Dr. Marcelino Scot 06/09/2018  1 unit PRBCs. 06/11/2018  Renal US 06/11/2018  2D echo 06/15/2018  Wound VAC removal per orthopedics 06/16/2018  Antimicrobials:   IV Cefepime 06/09/2018>>>>> 06/13/2018  IV vancomycin 06/09/2018>>>>>>06/10/2018  Vantin 06/13/2018>>>>>> 06/16/2018   Subjective: Patient sleeping has just returned from the OR where wound VAC was removed and significant drainage noted per orthopedics.  Objective: Vitals:   06/14/18 1916 06/15/18 0330 06/15/18 2045 06/16/18 0653  BP: (!) 155/48 (!) 171/49 (!) 156/53 (!) 158/47  Pulse: 60 66 68 66  Resp:   14 14  Temp: 98.4 F (36.9 C) 97.6 F (36.4 C) 98.1 F (36.7 C) 97.8 F (36.6 C)  TempSrc: Axillary Axillary Oral Oral  SpO2: 93% 93% 95% 94%  Weight:  61.5 kg (135 lb 9.3 oz)    Height:        Intake/Output Summary (Last 24 hours) at 06/16/2018 1452 Last data filed at 06/16/2018 0900 Gross per 24 hour  Intake 0 ml  Output 0 ml  Net 0 ml   Filed Weights   06/14/18 0508 06/14/18 1738 06/15/18 0330  Weight: 66.6 kg (146 lb 13.2 oz) 69 kg (152 lb 1.9 oz) 61.5 kg (135 lb 9.3 oz)    Examination:  General exam: Sleeping. Respiratory system:  Lungs clear to auscultation bilaterally.  No wheezes, no crackles, no rhonchi.  Respiratory effort normal. Cardiovascular system: Irregularly irregular.  No lower extremity edema.  No murmurs rubs or gallops. Gastrointestinal system: Abdomen is soft, nontender, nondistended positive bowel sounds.  No rebound.  No guarding.  Central nervous system: Sleeping.  No focal deficits noted.  Moving extremities spontaneously.   Right lower extremity with kerlex.  Extremities: Symmetric 5 x 5 power upper extremities.  Right lower extremity with kerlex.  4/5 left lower extremity strength. Skin: No rashes, lesions or ulcers Psychiatry: Judgement and insight appear fair. Mood & affect appropriate.     Data Reviewed: I have personally reviewed following labs and imaging studies  CBC: Recent Labs  Lab 06/12/18 0453 06/13/18 0533 06/14/18 0429 06/15/18 0222 06/16/18 0418  WBC 12.1* 9.7 11.3* 10.2 11.4*  HGB 9.3* 8.8* 9.9* 9.8* 9.9*  HCT 28.8* 27.4* 30.3* 30.1* 31.4*  MCV 98.3 97.9 99.3 97.7 99.4  PLT 100* 112* 131* 150 166   Basic Metabolic Panel: Recent Labs  Lab 06/10/18 1620  06/12/18 0453 06/13/18 0533 06/14/18 0429 06/15/18 0222 06/16/18 0418  NA 137   < > 136 140 141 140 142  K 3.8   < > 4.7 4.6 4.0 4.2 4.0  CL 105   < > 107 109 108 106 107  CO2 21*   < > 20* 22 21* 24 24  GLUCOSE 56*   < > 156* 194* 108* 180* 195*  BUN 65*   < > 64* 67* 66* 63* 60*  CREATININE 2.93*   < > 2.96* 2.91* 2.75* 2.95* 2.83*  CALCIUM 9.6   < > 10.2 10.5* 10.7* 10.6* 10.9*  MG 1.8  --  2.0  --  1.9 1.8  --    < > = values in this interval not displayed.   GFR: Estimated Creatinine Clearance: 10.3 mL/min (A) (by C-G formula based on SCr of 2.83 mg/dL (H)). Liver Function Tests: No results for input(s): AST, ALT, ALKPHOS, BILITOT, PROT, ALBUMIN in the last 168 hours. No results for input(s): LIPASE, AMYLASE in the last 168 hours. No results for input(s): AMMONIA in the last 168 hours. Coagulation  Profile: No results for input(s): INR, PROTIME in the last 168 hours. Cardiac Enzymes: No results for input(s): CKTOTAL, CKMB, CKMBINDEX, TROPONINI in the last 168 hours. BNP (last 3 results) No results for input(s): PROBNP in the last 8760 hours. HbA1C: No results for input(s): HGBA1C in the last 72 hours. CBG: Recent Labs  Lab 06/15/18 [phone] 06/15/18 1148 06/15/18 1812 06/15/18 2147 06/16/18 0617  GLUCAP 182* 184* 155* 139* 188*   Lipid Profile: No results for input(s): CHOL, HDL, LDLCALC, TRIG, CHOLHDL, LDLDIRECT in the last 72 hours. Thyroid Function Tests: No results for input(s): TSH, T4TOTAL, FREET4, T3FREE, THYROIDAB in the last 72 hours. Anemia Panel: No results for input(s): VITAMINB12, FOLATE, FERRITIN, TIBC, IRON, RETICCTPCT in the last 72 hours. Sepsis Labs: No results for input(s): PROCALCITON, LATICACIDVEN in the last 168 hours.  Recent Results (from the past 240 hour(s))  Culture, blood (routine x 2)     Status: None   Collection Time: 06/09/18 10:05 AM  Result Value Ref Range Status   Specimen Description BLOOD RIGHT FOREARM  Final   Special Requests   Final    BOTTLES DRAWN AEROBIC AND ANAEROBIC Blood Culture adequate volume   Culture   Final    NO GROWTH 5 DAYS Performed at [facility], [address], [place], [place] [address]    Report Status 06/14/2018 FINAL  Final  Urine Culture     Status: None   Collection Time: 06/10/18 11:04 AM  Result Value Ref Range Status   Specimen Description URINE, CLEAN CATCH  Final   Special Requests NONE  Final   Culture   Final    NO GROWTH Performed at [facility], [place] [address], [place], [place] [address]    Report Status 06/11/2018 FINAL  Final  MRSA PCR Screening     Status: None   Collection Time: 06/10/18 11:04 AM  Result Value Ref Range Status   MRSA by PCR NEGATIVE NEGATIVE Final    Comment:        The GeneXpert MRSA Assay (FDA approved for NASAL specimens only), is one component of  a comprehensive MRSA colonization surveillance program. It is not intended to diagnose MRSA infection nor to guide or monitor treatment for MRSA infections. Performed at [facility], [place] [address] [place], [place] [address]          Radiology Studies: No results found.      Scheduled Meds: . acetaminophen  500 mg Oral TID  . amLODipine  5 mg Oral Daily  . aspirin EC  325 mg Oral Daily  . cefdinir  300 mg Oral Q12H  . chlorhexidine  60 mL Topical Once  . docusate sodium  100 mg Oral BID  . enoxaparin (LOVENOX) injection  30 mg Subcutaneous Q24H  . feeding supplement (ENSURE ENLIVE)  237 mL Oral Q24H  . feeding supplement (PRO-STAT SUGAR FREE 64)  30 mL Oral BID WC  . ferrous sulfate  325 mg Oral Q breakfast  . furosemide  20 mg Oral Daily  . insulin aspart  0-9 Units Subcutaneous TID WC  . insulin glargine  7 Units Subcutaneous Daily  . ipratropium  2 spray Each Nare TID AC  . levothyroxine  88 mcg Oral QAC breakfast  . montelukast  10 mg Oral Daily  . pantoprazole  40 mg Oral Daily  . polyethylene glycol  17 g Oral Daily  . povidone-iodine  2 application Topical Once  . pravastatin  20 mg Oral Daily  . pregabalin  50 mg Oral Daily  . senna-docusate  1 tablet Oral Daily   Continuous Infusions:    LOS: 7 days    Time spent: 40 minutes    Irine Seal, MD [facility] Pager [phone]  If 7PM-7AM, please contact night-coverage [email or web address] Password [email or web address] 06/16/2018, 2:52 PM

## 2018-06-16 NOTE — Care Management Important Message (Signed)
Important Message  Patient Details  Name: [name] MRN: [id number] Date of Birth: 1915/08/25   Medicare Important Message Given:  Yes    Barb Merino Saud Bail 06/16/2018, 2:54 PM

## 2018-06-16 NOTE — Progress Notes (Signed)
Orthopedic Trauma Service Progress Note   Patient ID: [name] MRN: [id number] DOB/AGE: [birth year] 82 y.o.  Subjective:  In bed  Opens eyes minimally  Not verbalizing    ROS Unable to obtain due to metal status  Objective:   VITALS:   Vitals:   06/14/18 1916 06/15/18 0330 06/15/18 2045 06/16/18 0653  BP: (!) 155/48 (!) 171/49 (!) 156/53 (!) 158/47  Pulse: 60 66 68 66  Resp:   14 14  Temp: 98.4 F (36.9 C) 97.6 F (36.4 C) 98.1 F (36.7 C) 97.8 F (36.6 C)  TempSrc: Axillary Axillary Oral Oral  SpO2: 93% 93% 95% 94%  Weight:  61.5 kg (135 lb 9.3 oz)    Height:        Estimated body mass index is 21.24 kg/m as calculated from the following:   Height as of this encounter: 5\' 7"  (1.702 m).   Weight as of this encounter: 61.5 kg (135 lb 9.3 oz).   Intake/Output      06/17 0701 - 06/18 0700 06/18 0701 - 06/19 0700   P.O. 120    Total Intake(mL/kg) 120 (2)    Urine (mL/kg/hr) 600 (0.4)    Drains 20    Stool     Total Output 620    Net -500           LABS  Results for orders placed or performed during the hospital encounter of 06/09/18 (from the past 24 hour(s))  Glucose, capillary     Status: Abnormal   Collection Time: 06/15/18 11:48 AM  Result Value Ref Range   Glucose-Capillary 184 (H) 65 - 99 mg/dL  Glucose, capillary     Status: Abnormal   Collection Time: 06/15/18  6:12 PM  Result Value Ref Range   Glucose-Capillary 155 (H) 65 - 99 mg/dL  Glucose, capillary     Status: Abnormal   Collection Time: 06/15/18  9:47 PM  Result Value Ref Range   Glucose-Capillary 139 (H) 65 - 99 mg/dL  CBC     Status: Abnormal   Collection Time: 06/16/18  4:18 AM  Result Value Ref Range   WBC 11.4 (H) 4.0 - 10.5 K/uL   RBC 3.16 (L) 3.87 - 5.11 MIL/uL   Hemoglobin 9.9 (L) 12.0 - 15.0 g/dL   HCT 31.4 (L) 36.0 - 46.0 %   MCV 99.4 78.0 - 100.0 fL   MCH 31.3 26.0 - 34.0 pg   MCHC 31.5 30.0 - 36.0 g/dL   RDW 13.9 11.5 - 15.5 %   Platelets 152 150 - 400 K/uL  Basic metabolic panel     Status: Abnormal   Collection Time: 06/16/18  4:18 AM  Result Value Ref Range   Sodium 142 135 - 145 mmol/L   Potassium 4.0 3.5 - 5.1 mmol/L   Chloride 107 101 - 111 mmol/L   CO2 24 22 - 32 mmol/L   Glucose, Bld 195 (H) 65 - 99 mg/dL   BUN 60 (H) 6 - 20 mg/dL   Creatinine, Ser 2.83 (H) 0.44 - 1.00 mg/dL   Calcium 10.9 (H) 8.9 - 10.3 mg/dL   GFR calc non Af Amer 13 (L) >60 mL/min   GFR calc Af Amer 15 (L) >60 mL/min   Anion gap 11 5 - 15  Glucose, capillary     Status: Abnormal   Collection Time: 06/16/18  6:17 AM  Result Value Ref Range   Glucose-Capillary 188 (H) 65 - 99 mg/dL     PHYSICAL  EXAM:   Gen: in bed, moves legs around some with manipulation, NAD Ext:       Right Lower Extremity   VAC removed  Traumatic wound looks stable   No purulence    No excessive erythema   Surrounding skin looks good   Moderate serosanguinous drainage  Moves toes, ankle and leg spontaneously, not really following commands all that much   Swelling well controlled  + blister to heel   + DP pulse   Ext warm   Assessment/Plan: 7 Days Post-Op   Principal Problem:   Tibia and fibula open fracture, right Active Problems:   Hypothyroidism   Chronic kidney disease, stage III (moderate) (HCC)   Controlled diabetes mellitus with diabetic neuropathy, with long-term current use of insulin (HCC)   Unspecified atrial fibrillation (HCC)   Essential hypertension   HCAP (healthcare-associated pneumonia)   Acute lower UTI   Postoperative anemia   Aphasia   Acute metabolic encephalopathy   Hypervolemia   Anti-infectives (From admission, onward)   Start     Dose/Rate Route Frequency Ordered Stop   06/13/18 1000  cefdinir (OMNICEF) capsule 300 mg  Status:  Discontinued     300 mg Oral Every 12 hours 06/13/18 0845 06/13/18 0909   06/13/18 1000  cefdinir (OMNICEF) capsule 300 mg     300 mg Oral Every 12 hours 06/13/18 0911     06/11/18  1330  ceFEPIme (MAXIPIME) 1 g in sodium chloride 0.9 % 100 mL IVPB  Status:  Discontinued     1 g 200 mL/hr over 30 Minutes Intravenous Every 24 hours 06/11/18 0915 06/13/18 0845   06/11/18 1100  vancomycin (VANCOCIN) IVPB 1000 mg/200 mL premix  Status:  Discontinued     1,000 mg 200 mL/hr over 60 Minutes Intravenous Every 48 hours 06/09/18 0954 06/09/18 1842   06/11/18 1000  vancomycin (VANCOCIN) IVPB 1000 mg/200 mL premix  Status:  Discontinued     1,000 mg 200 mL/hr over 60 Minutes Intravenous Every 48 hours 06/10/18 0945 06/10/18 2119   06/11/18 1000  amoxicillin-clavulanate (AUGMENTIN) 875-125 MG per tablet 1 tablet  Status:  Discontinued     1 tablet Oral Every 12 hours 06/11/18 0912 06/11/18 0915   06/10/18 1000  ceFAZolin (ANCEF) IVPB 1 g/50 mL premix  Status:  Discontinued     1 g 100 mL/hr over 30 Minutes Intravenous Every 24 hours 06/09/18 1905 06/10/18 0937   06/10/18 1000  ceFEPIme (MAXIPIME) 1 g in sodium chloride 0.9 % 100 mL IVPB  Status:  Discontinued     1 g 200 mL/hr over 30 Minutes Intravenous Every 24 hours 06/10/18 0907 06/11/18 0912   06/09/18 1234  vancomycin (VANCOCIN) powder  Status:  Discontinued       As needed 06/09/18 1234 06/09/18 1348   06/09/18 1233  tobramycin (NEBCIN) injection  Status:  Discontinued       As needed 06/09/18 1233 06/09/18 1348   06/09/18 1015  ceFAZolin (ANCEF) IVPB 2g/100 mL premix     2 g 200 mL/hr over 30 Minutes Intravenous On call to O.R. 06/09/18 1011 06/09/18 1143   06/09/18 1000  vancomycin (VANCOCIN) IVPB 1000 mg/200 mL premix     1,000 mg 200 mL/hr over 60 Minutes Intravenous  Once 06/09/18 0949 06/09/18 1134   06/09/18 0945  ceFEPIme (MAXIPIME) 2 g in sodium chloride 0.9 % 100 mL IVPB  Status:  Discontinued     2 g 200 mL/hr over 30 Minutes Intravenous  Once 06/09/18  6314 06/09/18 1842    .  POD/HD#: 70 84 y/o female s/p fall with grade 3 b open R tibia and fibula fracture    -fall   - Grade 3B open R tibia and  fibula fracture s/p IMN             NWB R leg             vac removed (does not need home vac)   Wound really looks as good as could be expected   No evidence of infection at this point   Would anticipate moderate drainage   Reinforce dressing as needed   Dressing changes every other vs daily depending on drainage                PT/OT             float heels off bed!!!!  Ordered air mattress overlay as well              Knee ROM as tolerated   - R heel pressure sore  WOC nurse consult  Float heels  Air mattress overlay    - Pain management:             Tylenol             Minimize narcotics so as not to exacerbate confusion    - ABL anemia/Hemodynamics             cbc stable    - Medical issues              Per primary                         actue renal failure                DM                         Tight sugar control to minimize complications                          Goal is for cbgs to be <200 mg/dL   - DVT/PE prophylaxis:             ASA 325 daily    - ID:              Ancef x 72 hours completed    - Metabolic Bone Disease:             + osteoporosis   - Activity:             Up with assistance   - FEN/GI prophylaxis/Foley/Lines:             Carb mod diet    - Impediments to fracture healing:             Open fracture              DM    - Dispo:             ortho issues stable       Jari Pigg, PA-C [facility] [phone] (P) [phone] Levi Aland (C) 06/16/2018, 9:47 AM

## 2018-06-16 NOTE — Care Management Note (Signed)
Case Management Note  Patient Details  Name: [name] MRN: [id number] Date of Birth: 01-14-1915  Subjective/Objective: 84 yr old female admitted with right open tib/fib fracture.                    Action/Plan: Case manager has been following with SW for disposition. Patient is from [facility], plan is to return there with Tony from [facility]. MD is reassessing patient's wound to determine if she will need VAC at discharge or dressing changes. CM will continue to monitor.    Expected Discharge Date:    pending              Expected Discharge Plan:  Assisted Living / Rest Home  In-House Referral:  Clinical Social Work  Discharge planning Services  CM Consult  Post Acute Care Choice:  Home Health Choice offered to:  Adult Children  DME Arranged:  N/A DME Agency:  NA  HH Arranged:  PT, OT HH Agency:  [facility] ([facility]  Status of Service:  In process, will continue to follow  If discussed at Long Length of Stay Meetings, dates discussed:    Additional Comments:  Ninfa Meeker, RN 06/16/2018, 8:38 AM

## 2018-06-16 NOTE — Consult Note (Signed)
[place] Nurse wound consult note Reason for Consult: Right heel PI Wound type: DTI Pressure Injury POA: No Measurement: Purple/maroon bulla measures 2 cm x 3 cm.  Covering epithelium is intact. Plan of care:  Apply betadine to heel, allow to air dry.  Use pillows to float heels, or heel lift boots that are in the patient's room. Monitor the wound area(s) for worsening of condition such as: Signs/symptoms of infection,  Increase in size,  Development of or worsening of odor, Development of pain, or increased pain at the affected locations.  Notify the medical team if any of these develop.  Thank you for the consult.  Discussed plan of care with the patient.  [facility] nurse will not follow at this time.  Please re-consult the [facility] team if needed.  Val Riles, RN, MSN, CWOCN, CNS-BC, pager [phone]

## 2018-06-17 LAB — CBC
HEMATOCRIT: 32.8 % — AB (ref 36.0–46.0)
Hemoglobin: 10.4 g/dL — ABNORMAL LOW (ref 12.0–15.0)
MCH: 32 pg (ref 26.0–34.0)
MCHC: 31.7 g/dL (ref 30.0–36.0)
MCV: 100.9 fL — AB (ref 78.0–100.0)
PLATELETS: 175 10*3/uL (ref 150–400)
RBC: 3.25 MIL/uL — AB (ref 3.87–5.11)
RDW: 14.2 % (ref 11.5–15.5)
WBC: 10.7 10*3/uL — ABNORMAL HIGH (ref 4.0–10.5)

## 2018-06-17 LAB — BASIC METABOLIC PANEL
Anion gap: 10 (ref 5–15)
BUN: 59 mg/dL — AB (ref 6–20)
CHLORIDE: 110 mmol/L (ref 101–111)
CO2: 24 mmol/L (ref 22–32)
Calcium: 10.9 mg/dL — ABNORMAL HIGH (ref 8.9–10.3)
Creatinine, Ser: 2.71 mg/dL — ABNORMAL HIGH (ref 0.44–1.00)
GFR calc Af Amer: 16 mL/min — ABNORMAL LOW (ref >60)
GFR, EST NON AFRICAN AMERICAN: 13 mL/min — AB (ref >60)
GLUCOSE: 148 mg/dL — AB (ref 65–99)
Potassium: 4.1 mmol/L (ref 3.5–5.1)
Sodium: 144 mmol/L (ref 135–145)

## 2018-06-17 LAB — GLUCOSE, CAPILLARY: Glucose-Capillary: 158 mg/dL — ABNORMAL HIGH (ref 65–99)

## 2018-06-17 MED ORDER — ASPIRIN 325 MG PO TBEC: 325.0000 mg | DELAYED_RELEASE_TABLET | Freq: Every day | ORAL | 1 refills | Status: AC

## 2018-06-17 NOTE — Clinical Social Work Placement (Signed)
   CLINICAL SOCIAL WORK PLACEMENT  NOTE  Date:  06/17/2018  Patient Details  Name: [name] MRN: [id number] Date of Birth: 1915-02-10  Clinical Social Work is seeking post-discharge placement for this patient at the [place] level of care (*CSW will initial, date and re-position this form in  chart as items are completed):  Yes   Patient/family provided with [place] Work Department's list of facilities offering this level of care within the geographic area requested by the patient (or if unable, by the patient's family).  Yes   Patient/family informed of their freedom to choose among providers that offer the needed level of care, that participate in Medicare, Medicaid or managed care program needed by the patient, have an available bed and are willing to accept the patient.  Yes   Patient/family informed of [facility]'s ownership interest in [facility] and [facility], as well as of the fact that they are under no obligation to receive care at these facilities.  PASRR submitted to EDS on       PASRR number received on       Existing PASRR number confirmed on 06/11/18     FL2 transmitted to all facilities in geographic area requested by pt/family on       FL2 transmitted to all facilities within larger geographic area on 06/11/18     Patient informed that his/her managed care company has contracts with or will negotiate with certain facilities, including the following:        Yes   Patient/family informed of bed offers received.  Patient chooses bed at [facility]     Physician recommends and patient chooses bed at      Patient to be transferred to [facility] on 06/17/18.  Patient to be transferred to facility by [facility]     Patient family notified on 06/17/18 of transfer.  Name of family member notified:  son advised     PHYSICIAN       Additional Comment:     _______________________________________________ Normajean Baxter, LCSW 06/17/2018, 12:02 PM

## 2018-06-17 NOTE — Social Work (Addendum)
CSW f/u on disposition.  CSW called [facility]F- and left message for Clarene Critchley to call back to confirm receipt of dc documents.  CSW then called son Mikki Santee and confirmed return to ALF.  Elissa Hefty, LCSW Clinical Social Worker [phone]

## 2018-06-17 NOTE — Progress Notes (Signed)
Physical Therapy Treatment Patient Details Name: [name] MRN: [id number] DOB: 1915/05/28 Today's Date: 06/17/2018    History of Present Illness Pt is a 84 y.o. female s/p R tibial IM nail with I&D. PMHx: CKD, HTN, Hypothyroidism, DM2.    PT Comments    Pt making fair progress with functional mobility. She remains limited secondary to pain, weakness and cognitive impairments. Pt would continue to benefit from skilled physical therapy services at this time while admitted and after d/c to address the below listed limitations in order to improve overall safety and independence with functional mobility.    Follow Up Recommendations  SNF     Equipment Recommendations  None recommended by PT    Recommendations for Other Services       Precautions / Restrictions Precautions Precautions: Fall Restrictions Weight Bearing Restrictions: Yes RLE Weight Bearing: Non weight bearing    Mobility  Bed Mobility Overal bed mobility: Needs Assistance Bed Mobility: Supine to Sit     Supine to sit: Max assist;+2 for physical assistance     General bed mobility comments: increased time and effort, assist with bilateral LE movement off of bed and for trunk elevation  Transfers Overall transfer level: Needs assistance Equipment used: (gait belt and bed pads) Transfers: Lateral/Scoot Transfers          Lateral/Scoot Transfers: +2 physical assistance;Max assist General transfer comment: lateral scoot transfer towards pt's L side with drop arm recliner chair; heavy two person assistance with use of gait belt and bed pads  Ambulation/Gait                 Stairs             Wheelchair Mobility    Modified Rankin (Stroke Patients Only)       Balance Overall balance assessment: Needs assistance Sitting-balance support: Feet supported;Bilateral upper extremity supported Sitting balance-Leahy Scale: Poor Sitting balance - Comments: min A to maintain upright  sitting EOB                                    Cognition Arousal/Alertness: Awake/alert Behavior During Therapy: WFL for tasks assessed/performed Overall Cognitive Status: Impaired/Different from baseline Area of Impairment: Orientation;Memory;Safety/judgement;Awareness;Following commands;Problem solving                 Orientation Level: Disoriented to;Situation;Time Current Attention Level: Sustained Memory: Decreased short-term memory;Decreased recall of precautions Following Commands: Follows one step commands inconsistently Safety/Judgement: Decreased awareness of deficits;Decreased awareness of safety Awareness: Intellectual Problem Solving: Slow processing;Decreased initiation;Difficulty sequencing;Requires tactile cues;Requires verbal cues        Exercises      General Comments        Pertinent Vitals/Pain Pain Assessment: Faces Faces Pain Scale: No hurt    Home Living                      Prior Function            PT Goals (current goals can now be found in the care plan section) Acute Rehab PT Goals PT Goal Formulation: Patient unable to participate in goal setting Time For Goal Achievement: 06/24/18 Potential to Achieve Goals: Fair Progress towards PT goals: Progressing toward goals    Frequency    Min 3X/week      PT Plan Current plan remains appropriate    Co-evaluation  AM-PAC PT "6 Clicks" Daily Activity  Outcome Measure  Difficulty turning over in bed (including adjusting bedclothes, sheets and blankets)?: Unable Difficulty moving from lying on back to sitting on the side of the bed? : Unable Difficulty sitting down on and standing up from a chair with arms (e.g., wheelchair, bedside commode, etc,.)?: Unable Help needed moving to and from a bed to chair (including a wheelchair)?: A Lot Help needed walking in hospital room?: Total Help needed climbing 3-5 steps with a railing? : Total 6  Click Score: 7    End of Session Equipment Utilized During Treatment: Gait belt Activity Tolerance: Patient limited by pain Patient left: in chair;with call bell/phone within reach;with chair alarm set Nurse Communication: Mobility status PT Visit Diagnosis: Other abnormalities of gait and mobility (R26.89);Pain Pain - Right/Left: Right Pain - part of body: Leg     Time: 1245-8099 PT Time Calculation (min) (ACUTE ONLY): 17 min  Charges:  $Therapeutic Activity: 8-22 mins                    G Codes:       [place], [name], [place] 06/17/2018, 3:54 PM

## 2018-06-17 NOTE — Discharge Summary (Signed)
Physician Discharge Summary  [name] NAT:[id number] DOB: [birth year] DOA: 06/09/2018  PCP: Leanna Battles, MD  Admit date: 06/09/2018 Discharge date: 06/17/2018  Admitted From: ALF Disposition: ALF  Recommendations for Outpatient Follow-up:  1. Follow up with PCP in 1-2 weeks 2. Please obtain BMP/CBC in one week  Home Health: No Equipment/Devices: No  Discharge Condition: stable CODE STATUS:DNR Diet recommendation: regular  Brief/Interim Summary:  #) Status post right tib-fib fracture: Patient was admitted after mechanical fall and open right tibia and fibula fracture.  Orthopedic surgery was consulted and patient had repair on 06/09/2018.  Patient was seen by physical therapy who recommended skilled nursing facility however family felt that it would be best to patient return to assisted living facility with daily physical therapy evaluations.  Patient has outpatient follow-up with orthopedic surgery.  #) Possible stroke: On 06/12/2018 patient was noted to be nonverbal.  A code stroke was called and head CT was performed which was negative.  Patient was seen by neurology and it was decided to transition the patient from 81 mg of aspirin to 324.  Patient was noted to have atrial fibrillation on admission however she was not felt to be candidate for anticoagulation.  Her beta-blocker was discontinued due to persistent heart rate in the 50s.  #) AKI: Patient was admitted with some AKI which resolved with IV fluids.  Her ARB was held.  Her renal ultrasound showed chronic medical renal disease.  There was some left hydronephrosis which was stable per the prior CT.  She was discharged home to resume her ARB.  #) Possible UTI/pneumonia: Patient was admitted with your endocrine out pansensitive E. coli.  She also was noted to have atelectasis on a chest x-ray that could have been pneumonia.  She did not have much in the way of symptoms.  She received antibiotics for these.  #)  Dementia/altered mental status: Patient had episodes of worsening dementia and delirium likely secondary to hospitalization.  The above-mentioned nonverbal episode was felt to be more likely secondary to possible altered mental status and hypoactive delirium.  #) Chronic diastolic heart failure: Patient received IV furosemide for several dose due to coarse breath sounds.  This improved and she was transitioned back to home oral furosemide.  #) Hypertension: Patient's ARB was held due to AKI.  Her beta-blocker was held due to low heart rate.  She was discharged home to continue her home antihypertensives.  #) Hyperlipidemia: Patient was continued on statin  #) Type 2 diabetes: Patient was maintained on sliding scale here as well as her scheduled Lantus.  Her home oral hypoglycemics were held during admission.  She will restart her home regimen of sliding scale insulin, Lantus, oral hypoglycemics at her assisted living facility.  #) Anemia: Patient was noted to have some postoperative anemia stabilized.  Patient will have outpatient follow-up.  #) Hypothyroidism: Patient was continued on home levothyroxine dose.  Discharge Diagnoses:  Principal Problem:   Tibia and fibula open fracture, right Active Problems:   Hypothyroidism   Chronic kidney disease, stage III (moderate) (HCC)   Controlled diabetes mellitus with diabetic neuropathy, with long-term current use of insulin (HCC)   Unspecified atrial fibrillation (HCC)   Essential hypertension   HCAP (healthcare-associated pneumonia)   Acute lower UTI   Postoperative anemia   Aphasia   Acute metabolic encephalopathy   Hypervolemia    Discharge Instructions  Discharge Instructions    Call MD for:  difficulty breathing, headache or visual disturbances   Complete  by:  As directed    Call MD for:  hives   Complete by:  As directed    Call MD for:  persistant nausea and vomiting   Complete by:  As directed    Call MD for:  redness,  tenderness, or signs of infection (pain, swelling, redness, odor or green/yellow discharge around incision site)   Complete by:  As directed    Call MD for:  severe uncontrolled pain   Complete by:  As directed    Call MD for:  temperature >100.4   Complete by:  As directed    Diet - low sodium heart healthy   Complete by:  As directed    Discharge instructions   Complete by:  As directed    Please follow-up with your primary care doctor in 1 to 2 weeks.   Increase activity slowly   Complete by:  As directed    Non weight bearing   Complete by:  As directed    Laterality:  right   Extremity:  Lower     Allergies as of 06/17/2018      Reactions   Capsaicin Other (See Comments)   Topical application BURNED the skin      Medication List    STOP taking these medications   aspirin 81 MG tablet Replaced by:  aspirin 325 MG EC tablet   traMADol-acetaminophen 37.5-325 MG tablet Commonly known as:  ULTRACET     TAKE these medications   amLODipine 5 MG tablet Commonly known as:  NORVASC Take 1 tablet (5 mg total) by mouth daily. What changed:  how much to take   aspirin 325 MG EC tablet Take 1 tablet (325 mg total) by mouth daily. Start taking on:  06/18/2018 Replaces:  aspirin 81 MG tablet   CALMOSEPTINE 0.44-20.625 % Oint Generic drug:  Menthol-Zinc Oxide Apply 1 application topically as needed (Diaper change). Apply topically to affected area after each diaper change   COMBIVENT RESPIMAT 20-100 MCG/ACT Aers respimat Generic drug:  Ipratropium-Albuterol Inhale 1 puff into the lungs 2 (two) times daily as needed for wheezing.   DAILY-VITE PO Take 1 tablet by mouth daily.   docusate sodium 100 MG capsule Commonly known as:  COLACE Take 1 capsule (100 mg total) by mouth 2 (two) times daily. What changed:  when to take this   ferrous sulfate 325 (65 FE) MG tablet Take 325 mg by mouth daily with breakfast.   furosemide 20 MG tablet Commonly known as:  LASIX Take 20  mg by mouth.   insulin aspart 100 UNIT/ML FlexPen Commonly known as:  NOVOLOG FLEXPEN If blood sugar is over 200, take 5 units and if they go over 300, take 6 units at that meal. What changed:    how much to take  how to take this  when to take this  additional instructions   ipratropium 0.03 % nasal spray Commonly known as:  ATROVENT Place 2 sprays into both nostrils 3 (three) times daily before meals.   LANTUS SOLOSTAR 100 UNIT/ML Solostar Pen Generic drug:  Insulin Glargine Inject 7 Units into the skin every morning.   MAPAP 325 MG tablet Generic drug:  acetaminophen Take 650 mg by mouth every 6 (six) hours as needed (for fever).   metoprolol tartrate 25 MG tablet Commonly known as:  LOPRESSOR Take 1 tablet (25 mg total) by mouth 2 (two) times daily.   montelukast 10 MG tablet Commonly known as:  SINGULAIR Take 10 mg by mouth  daily.   pantoprazole 40 MG tablet Commonly known as:  PROTONIX TAKE 1 TABLET BY MOUTH TWICE DAILY What changed:    how much to take  how to take this  when to take this   [place] Take 1 capsule by mouth daily.   polyethylene glycol packet Commonly known as:  MIRALAX / GLYCOLAX Take 17 g by mouth daily. Mix 17 gm WITH 4-8 oz  Water and drink every day * Hold for Diarreah   pravastatin 20 MG tablet Commonly known as:  PRAVACHOL Take 20 mg by mouth daily.   pregabalin 100 MG capsule Commonly known as:  LYRICA Take 100 mg by mouth See admin instructions. 100 mg once a day at 7 PM   SYNTHROID 88 MCG tablet Generic drug:  levothyroxine TAKE 1 TABLET DAILY What changed:    how much to take  how to take this  when to take this   traMADol 50 MG tablet Commonly known as:  ULTRAM Take 1 tablet (50 mg total) by mouth 2 (two) times daily. What changed:    when to take this  additional instructions   VENTOLIN HFA 108 (90 Base) MCG/ACT inhaler Generic drug:  albuterol Inhale 2 puffs into the lungs every 4  (four) hours as needed for wheezing or shortness of breath.   vitamin B-12 1000 MCG tablet Commonly known as:  CYANOCOBALAMIN Take 1,000 mcg by mouth daily.            Durable Medical Equipment  (From admission, onward)        Start     Ordered   06/11/18 1045  For home use only DME Negative pressure wound device  Once    Comments:  To be changed at MD office  Will need for at least a week  50 mm/Hg  Question Answer Comment  Frequency of dressing change Other see comments   Length of need Other see comments   Dressing type Foam   Amount of suction Other(see comments)   Pressure application Continuous pressure   Supplies 10 canisters and 15 dressings per month for duration of therapy      06/11/18 1046       Discharge Care Instructions  (From admission, onward)        Start     Ordered   06/17/18 0000  Non weight bearing    Question Answer Comment  Laterality right   Extremity Lower      06/17/18 0927     Follow-up Information    Altamese Butler, MD. Schedule an appointment as soon as possible for a visit on 06/22/2018.   Specialty:  Orthopedic Surgery Contact information: [place] [address] [place] [address] [phone]          Allergies  Allergen Reactions  . Capsaicin Other (See Comments)    Topical application BURNED the skin    Consultations:  Orthopedic surgery  Neurology   Procedures/Studies: Dg Tibia/fibula Right  Result Date: 06/09/2018 CLINICAL DATA:  Lower leg fractures.  Tibial intramedullary nail. EXAM: RIGHT TIBIA AND FIBULA - 2 VIEW; DG C-ARM 61-120 MIN FLUOROSCOPY TIME:  1 minutes and 4 seconds C-arm fluoroscopic images were obtained intraoperatively and submitted for post operative interpretation. Please see the performing provider's procedural report for the fluoroscopy time utilized. COMPARISON:  Radiographs earlier the same date. FINDINGS: Six spot fluoroscopic images are submitted. These demonstrate the  placement of a tibial intramedullary nail, secured by 2 proximal and 2  distal interlocking screws. There is near anatomic reduction of the oblique fracture of the distal tibial diaphysis. The alignment of the fibular fracture is also significantly improved. No perioperative complications. IMPRESSION: Improved alignment of the tibial and fibular fractures post tibial intramedullary nail fixation. No demonstrated complication. Electronically Signed   By: Richardean Sale M.D.   On: 06/09/2018 13:49   Dg Tibia/fibula Right  Result Date: 06/09/2018 CLINICAL DATA:  Pain following fall EXAM: RIGHT TIBIA AND FIBULA - 2 VIEW COMPARISON:  None. FINDINGS: Frontal and lateral views were obtained. There are fractures of each distal tibia and fibula. The distal tibial fracture is located approximately 9 cm proximal to the tibial plafond. This fracture is obliquely oriented with lateral and anterior displacement as well as anterior angulation distally. There is a comminuted fracture of the distal fibula at this same level. There is lateral and anterior displacement as well as anterior angulation of the distal major fracture fragment with respect proximal fragment. In addition, there is a fracture of the proximal fibular metaphysis with slight impaction at the fracture site. No other fractures are evident. No dislocation. There is moderate osteoarthritic change in the knee and ankle regions. IMPRESSION: Displaced fractures of the distal tibia and fibula as described. Fracture of the proximal fibular metaphysis with mild impaction and near anatomic alignment. No dislocations. Osteoarthritic change in the knee and ankle regions noted. Electronically Signed   By: Lowella Grip III M.D.   On: 06/09/2018 09:33   Dg Ankle 2 Views Right  Result Date: 06/09/2018 CLINICAL DATA:  [place] EXAM: RIGHT ANKLE - 2 VIEW COMPARISON:  None. FINDINGS: Oblique fracture through the distal tibial shaft. Transverse fracture through the distal  fibular shaft. There is nearly 90 degrees angulation, at least 90 degrees rotation, and greater than shaft width displacement of distal fracture fragments. There is soft tissue irregularity suggesting open fracture. Tibiotalar articulation appears preserved on this single projection. IMPRESSION: 1. Distal tibial and fibular shaft fractures with significant angulation, rotation, and displacement of fracture fragments. Electronically Signed   By: Lucrezia Europe M.D.   On: 06/09/2018 09:12   Dg Ankle Complete Right  Result Date: 06/09/2018 CLINICAL DATA:  Pain following fall EXAM: RIGHT ANKLE - COMPLETE 3+ VIEW COMPARISON:  Right tibia and fibula radiographs June 09, 2018 FINDINGS: Frontal, oblique, and lateral views were obtained. There are displaced fractures of the distal tibia and fibula located approximately 9 cm proximal to the tibial plafond. In the ankle region, there is no appreciable fracture or joint effusion. There is mild joint space narrowing laterally. There is an inferior calcaneal spur. No erosive change. IMPRESSION: Fractures of the distal tibia and fibula with lateral aunt and anterior displacement as well as anterior angulation of distal fracture fragments, described in the report of the right tibia and fibula. In the ankle region, no fracture evident. Ankle mortise appears intact. Electronically Signed   By: Lowella Grip III M.D.   On: 06/09/2018 09:36   Ct Head Wo Contrast  Result Date: 06/09/2018 CLINICAL DATA:  Unwitnessed fall.  History of underlying dementia EXAM: CT HEAD WITHOUT CONTRAST CT CERVICAL SPINE WITHOUT CONTRAST TECHNIQUE: Multidetector CT imaging of the head and cervical spine was performed following the standard protocol without intravenous contrast. Multiplanar CT image reconstructions of the cervical spine were also generated. COMPARISON:  Head CT May 24, 2016 FINDINGS: CT HEAD FINDINGS Brain: There is moderate diffuse atrophy. There is no intracranial mass, hemorrhage,  extra-axial fluid collection, or midline shift. There is  evidence of a prior infarct in the left frontal lobe superiorly, not present on prior study from 2017. There is evidence of a prior infarct in the mid right cerebellum, stable. There is patchy small vessel disease in the centra semiovale bilaterally, stable. There is evidence of a prior small infarct in the mid right external capsule region, stable. There is a focal small infarct at the junction of the right lentiform nucleus and inferior centrum semiovale on the right, stable. There is a prior appearing small infarct in the mid left thalamus. There is no acute appearing infarct evident on this study. Vascular: No appreciable hyperdense vessel. There is calcification in the left distal vertebral artery as well as in both carotid siphon regions. Skull: Bony calvarium appears intact. There is a right parietal scalp hematoma. There is arthropathy in each temporomandibular joint. Sinuses/Orbits: There is mucosal thickening in several ethmoid air cells. Other paranasal sinuses are clear. Orbits appear symmetric bilaterally. Other: Mastoids on the right are clear. There is opacification of several inferior mastoid air cells on the left, stable from prior study. CT CERVICAL SPINE FINDINGS Alignment: There is 2 mm of retrolisthesis of C4 on C5. There is no other appreciable spondylolisthesis. Skull base and vertebrae: Skull base and craniocervical junction regions appear normal. There is moderate pannus posterior to the odontoid, not causing appreciable impression on the underlying craniocervical junction. No fracture is appreciable. There are no blastic or lytic bone lesions. Soft tissues and spinal canal: Prevertebral soft tissues and predental space regions are normal. There is bony hypertrophy posteriorly at several levels, not causing high-grade stenosis. No cord canal hematoma evident. No paraspinous lesions. Disc levels: There is moderate disc space narrowing  at C3-4. There is mild disc space narrowing at C4-5. Other disc spaces appear unremarkable. There is multilevel facet hypertrophy. No disc extrusion or stenosis. Upper chest: There is pleural effusion on the left, free-flowing. There is scarring in each lung apex. Other: There is calcification in each subclavian artery as well as in each carotid artery. IMPRESSION: CT head: 1. Atrophy with scattered prior infarcts and small vessel disease. No acute infarct evident. No mass or hemorrhage. 2.   Foci of arterial vascular calcification noted. 3. Parietal scalp hematoma noted on the right. No bony abnormality. 4. Mucosal thickening noted in several ethmoid air cells. Chronic inferior left mastoid air cell disease. 5.  Arthropathy noted in each temporomandibular joint. CT cervical spine: 1. No demonstrable fracture. Mild spondylolisthesis at C4-5 is felt to be due to underlying spondylosis. 2. There is osteoarthritic change at several levels. No frank disc extrusion or stenosis. 3.  Free-flowing pleural effusion on the left. 4.  Calcification in both subclavian and carotid arteries. Electronically Signed   By: Lowella Grip III M.D.   On: 06/09/2018 10:19   Ct Cervical Spine Wo Contrast  Result Date: 06/09/2018 CLINICAL DATA:  Unwitnessed fall.  History of underlying dementia EXAM: CT HEAD WITHOUT CONTRAST CT CERVICAL SPINE WITHOUT CONTRAST TECHNIQUE: Multidetector CT imaging of the head and cervical spine was performed following the standard protocol without intravenous contrast. Multiplanar CT image reconstructions of the cervical spine were also generated. COMPARISON:  Head CT May 24, 2016 FINDINGS: CT HEAD FINDINGS Brain: There is moderate diffuse atrophy. There is no intracranial mass, hemorrhage, extra-axial fluid collection, or midline shift. There is evidence of a prior infarct in the left frontal lobe superiorly, not present on prior study from 2017. There is evidence of a prior infarct in the mid right  cerebellum, stable. There is patchy small vessel disease in the centra semiovale bilaterally, stable. There is evidence of a prior small infarct in the mid right external capsule region, stable. There is a focal small infarct at the junction of the right lentiform nucleus and inferior centrum semiovale on the right, stable. There is a prior appearing small infarct in the mid left thalamus. There is no acute appearing infarct evident on this study. Vascular: No appreciable hyperdense vessel. There is calcification in the left distal vertebral artery as well as in both carotid siphon regions. Skull: Bony calvarium appears intact. There is a right parietal scalp hematoma. There is arthropathy in each temporomandibular joint. Sinuses/Orbits: There is mucosal thickening in several ethmoid air cells. Other paranasal sinuses are clear. Orbits appear symmetric bilaterally. Other: Mastoids on the right are clear. There is opacification of several inferior mastoid air cells on the left, stable from prior study. CT CERVICAL SPINE FINDINGS Alignment: There is 2 mm of retrolisthesis of C4 on C5. There is no other appreciable spondylolisthesis. Skull base and vertebrae: Skull base and craniocervical junction regions appear normal. There is moderate pannus posterior to the odontoid, not causing appreciable impression on the underlying craniocervical junction. No fracture is appreciable. There are no blastic or lytic bone lesions. Soft tissues and spinal canal: Prevertebral soft tissues and predental space regions are normal. There is bony hypertrophy posteriorly at several levels, not causing high-grade stenosis. No cord canal hematoma evident. No paraspinous lesions. Disc levels: There is moderate disc space narrowing at C3-4. There is mild disc space narrowing at C4-5. Other disc spaces appear unremarkable. There is multilevel facet hypertrophy. No disc extrusion or stenosis. Upper chest: There is pleural effusion on the left,  free-flowing. There is scarring in each lung apex. Other: There is calcification in each subclavian artery as well as in each carotid artery. IMPRESSION: CT head: 1. Atrophy with scattered prior infarcts and small vessel disease. No acute infarct evident. No mass or hemorrhage. 2.   Foci of arterial vascular calcification noted. 3. Parietal scalp hematoma noted on the right. No bony abnormality. 4. Mucosal thickening noted in several ethmoid air cells. Chronic inferior left mastoid air cell disease. 5.  Arthropathy noted in each temporomandibular joint. CT cervical spine: 1. No demonstrable fracture. Mild spondylolisthesis at C4-5 is felt to be due to underlying spondylosis. 2. There is osteoarthritic change at several levels. No frank disc extrusion or stenosis. 3.  Free-flowing pleural effusion on the left. 4.  Calcification in both subclavian and carotid arteries. Electronically Signed   By: Lowella Grip III M.D.   On: 06/09/2018 10:19   US Renal  Result Date: 06/11/2018 CLINICAL DATA:  Acute renal failure EXAM: RENAL / URINARY TRACT ULTRASOUND COMPLETE COMPARISON:  CT 12/27/2017 FINDINGS: Right Kidney: Length: 8.6 cm. Cortical thinning and increased echotexture. No mass or hydronephrosis. Left Kidney: Length: 9.2 cm. Cortical thinning and increased echotexture. Moderate left hydronephrosis, stable since prior CT. Bladder: Posterior bladder wall thickening noted, similar to prior CT. IMPRESSION: Cortical thinning and increased echotexture throughout the kidneys bilaterally compatible with chronic medical renal disease. Moderate left hydronephrosis is stable since prior CT. Posterior bladder wall thickening is similar to prior CT. Given the asymmetric localized nature to the posterior bladder wall, cannot exclude bladder cancer. Consider further evaluation with cystoscopy if felt clinically indicated. Electronically Signed   By: Rolm Baptise M.D.   On: 06/11/2018 10:27   Dg Chest Port 1 View  Result  Date: 06/12/2018 CLINICAL DATA:  Shortness of breath. EXAM: PORTABLE CHEST 1 VIEW COMPARISON:  06/09/2018. FINDINGS: Cardiomegaly. Atelectasis/infiltrate right upper lobe and left lower lobe. Small left pleural effusion. These findings have improved slightly from prior exam P no pneumothorax. Calcified pulmonary nodules noted most likely granulomas. These are stable. Degenerative changes thoracic spine. Thoracic spine scoliosis IMPRESSION: Persistent atelectasis/infiltrate right upper lobe and left lower lobe. Small left pleural effusion. These findings have improved slightly from prior exam. Continued follow-up exams suggested to demonstrate complete clearing. Electronically Signed   By: Marcello Moores  Register   On: 06/12/2018 09:10   Dg Chest Port 1 View  Addendum Date: 06/09/2018   ADDENDUM REPORT: 06/09/2018 10:21 ADDENDUM: There is a left pleural effusion. Electronically Signed   By: Lowella Grip III M.D.   On: 06/09/2018 10:21   Result Date: 06/09/2018 CLINICAL DATA:  Pain following fall EXAM: PORTABLE CHEST 1 VIEW COMPARISON:  Apr 30, 2016 FINDINGS: There is airspace consolidation in the right upper lobe consistent with a degree of pneumonia. The lungs elsewhere are clear. Heart is upper normal in size with pulmonary vascularity normal. No adenopathy. Bones are osteoporotic. No pneumothorax. No fracture. There is bilateral carotid artery calcification. IMPRESSION: Airspace consolidation right upper lobe consistent with pneumonia. Lungs elsewhere clear. Stable cardiac silhouette. Bones osteoporotic. No evident pneumothorax. There is calcification in each carotid artery. Electronically Signed: By: Lowella Grip III M.D. On: 06/09/2018 09:34   Dg Tibia/fibula Right Port  Result Date: 06/09/2018 CLINICAL DATA:  Open tibial fracture fixation EXAM: PORTABLE RIGHT TIBIA AND FIBULA - 2 VIEW COMPARISON:  Intraoperative study from 06/09/2018 FINDINGS: Intramedullary nail fixation of the right tibia is noted  without postoperative complicating features noted. A wound VAC is seen along the medial aspect of the distal right leg with antibiotic impregnated beads projecting over the distal third of the tibial diaphysis anteriorly. Fracture of the fibular neck and junction of the middle and distal third are noted as before. IMPRESSION: 1. IM nail fixation of open tibial fracture with wound vac device along the medial aspect of the distal right leg noted and antibiotic impregnated beads projecting over the anterior aspect of the distal right leg. Alignment is near anatomic. 2. Fibular neck and distal fibular diaphyseal fractures without significant displacement as before. Electronically Signed   By: Ashley Royalty M.D.   On: 06/09/2018 20:00   Dg C-arm 1-60 Min  Result Date: 06/09/2018 CLINICAL DATA:  Lower leg fractures.  Tibial intramedullary nail. EXAM: RIGHT TIBIA AND FIBULA - 2 VIEW; DG C-ARM 61-120 MIN FLUOROSCOPY TIME:  1 minutes and 4 seconds C-arm fluoroscopic images were obtained intraoperatively and submitted for post operative interpretation. Please see the performing provider's procedural report for the fluoroscopy time utilized. COMPARISON:  Radiographs earlier the same date. FINDINGS: Six spot fluoroscopic images are submitted. These demonstrate the placement of a tibial intramedullary nail, secured by 2 proximal and 2 distal interlocking screws. There is near anatomic reduction of the oblique fracture of the distal tibial diaphysis. The alignment of the fibular fracture is also significantly improved. No perioperative complications. IMPRESSION: Improved alignment of the tibial and fibular fractures post tibial intramedullary nail fixation. No demonstrated complication. Electronically Signed   By: Richardean Sale M.D.   On: 06/09/2018 13:49   Ct Head Code Stroke Wo Contrast  Result Date: 06/12/2018 CLINICAL DATA:  Code stroke. Aphasia. Recent tibia and fibula fractures status post internal fixation. EXAM:  CT HEAD WITHOUT CONTRAST TECHNIQUE: Contiguous axial images were obtained from the base of the skull  through the vertex without intravenous contrast. COMPARISON:  06/09/2018 FINDINGS: Brain: Chronic infarcts are again seen in the high posterior left frontal lobe, right basal ganglia, and right cerebellum. There is also an unchanged, tiny chronic left parietal cortical infarct. No definite acute infarct, intracranial hemorrhage, mass, midline shift, or extra-axial fluid collection is identified. Moderate cerebral atrophy is most notable in the parietal regions. Scattered periventricular and subcortical white matter hypodensities are unchanged and nonspecific but compatible with chronic small vessel ischemic disease, mild for age. Vascular: Calcified atherosclerosis at the skull base. No hyperdense vessel. Skull: No fracture or focal osseous lesion. Sinuses/Orbits: Visualized paranasal sinuses are clear. There is chronic left mastoid air cell opacification inferiorly. Bilateral cataract extraction is noted. Other: Persistent small right parietal scalp hematoma. ASPECTS [facility] Stroke Program Early CT Score) - Ganglionic level infarction (caudate, lentiform nuclei, internal capsule, insula, M1-M3 cortex): 7 - Supraganglionic infarction (M4-M6 cortex): 3 Total score (0-10 with 10 being normal): 10 IMPRESSION: 1. No evidence of acute intracranial abnormality. 2. ASPECTS is 10. 3. Chronic infarcts as above. 4. Persistent right parietal scalp hematoma. These results were communicated to Dr. Rory Percy at 8:56 am on 06/12/2018 by text page via the [facility] messaging system. Electronically Signed   By: Logan Bores M.D.   On: 06/12/2018 08:56       Subjective:   Discharge Exam: Vitals:   06/16/18 2017 06/17/18 0600  BP: (!) 157/78 (!) 160/82  Pulse: 66 (!) 52  Resp: 12 15  Temp: 98.6 F (37 C) 98.4 F (36.9 C)  SpO2: 100% 100%   Vitals:   06/15/18 2045 06/16/18 0653 06/16/18 2017 06/17/18 0600  BP: (!) 156/53  (!) 158/47 (!) 157/78 (!) 160/82  Pulse: 68 66 66 (!) 52  Resp: 14 14 12 15   Temp: 98.1 F (36.7 C) 97.8 F (36.6 C) 98.6 F (37 C) 98.4 F (36.9 C)  TempSrc: Oral Oral Axillary Axillary  SpO2: 95% 94% 100% 100%  Weight:    66.7 kg (147 lb)  Height:        General: No acute distress, alert and oriented x2 Cardiovascular: Irregularly irregular, no murmurs Respiratory: CTA bilaterally, no wheezing, no rhonchi Abdominal: Soft, NT, ND, bowel sounds + Extremities: Right lower extremity incision site is clean dry intact, intact distal pulses and good cap refill    The results of significant diagnostics from this hospitalization (including imaging, microbiology, ancillary and laboratory) are listed below for reference.     Microbiology: Recent Results (from the past 240 hour(s))  Culture, blood (routine x 2)     Status: None   Collection Time: 06/09/18 10:05 AM  Result Value Ref Range Status   Specimen Description BLOOD RIGHT FOREARM  Final   Special Requests   Final    BOTTLES DRAWN AEROBIC AND ANAEROBIC Blood Culture adequate volume   Culture   Final    NO GROWTH 5 DAYS Performed at [facility], [address], [place], [place] [address]    Report Status 06/14/2018 FINAL  Final  Urine Culture     Status: None   Collection Time: 06/10/18 11:04 AM  Result Value Ref Range Status   Specimen Description URINE, CLEAN CATCH  Final   Special Requests NONE  Final   Culture   Final    NO GROWTH Performed at [facility], [place] [address], [place], [place] [address]    Report Status 06/11/2018 FINAL  Final  MRSA PCR Screening     Status:  None   Collection Time: 06/10/18 11:04 AM  Result Value Ref Range Status   MRSA by PCR NEGATIVE NEGATIVE Final    Comment:        The GeneXpert MRSA Assay (FDA approved for NASAL specimens only), is one component of a comprehensive MRSA colonization surveillance program. It is not intended to diagnose MRSA infection nor to guide  or monitor treatment for MRSA infections. Performed at [facility], [place] [address] [place], [place] [address]      Labs: BNP (last 3 results) No results for input(s): BNP in the last 8760 hours. Basic Metabolic Panel: Recent Labs  Lab 06/10/18 1620  06/12/18 0453 06/13/18 0533 06/14/18 0429 06/15/18 0222 06/16/18 0418 06/17/18 0347  NA 137   < > 136 140 141 140 142 144  K 3.8   < > 4.7 4.6 4.0 4.2 4.0 4.1  CL 105   < > 107 109 108 106 107 110  CO2 21*   < > 20* 22 21* 24 24 24   GLUCOSE 56*   < > 156* 194* 108* 180* 195* 148*  BUN 65*   < > 64* 67* 66* 63* 60* 59*  CREATININE 2.93*   < > 2.96* 2.91* 2.75* 2.95* 2.83* 2.71*  CALCIUM 9.6   < > 10.2 10.5* 10.7* 10.6* 10.9* 10.9*  MG 1.8  --  2.0  --  1.9 1.8  --   --    < > = values in this interval not displayed.   Liver Function Tests: No results for input(s): AST, ALT, ALKPHOS, BILITOT, PROT, ALBUMIN in the last 168 hours. No results for input(s): LIPASE, AMYLASE in the last 168 hours. No results for input(s): AMMONIA in the last 168 hours. CBC: Recent Labs  Lab 06/13/18 0533 06/14/18 0429 06/15/18 0222 06/16/18 0418 06/17/18 0347  WBC 9.7 11.3* 10.2 11.4* 10.7*  HGB 8.8* 9.9* 9.8* 9.9* 10.4*  HCT 27.4* 30.3* 30.1* 31.4* 32.8*  MCV 97.9 99.3 97.7 99.4 100.9*  PLT 112* 131* 150 152 175   Cardiac Enzymes: No results for input(s): CKTOTAL, CKMB, CKMBINDEX, TROPONINI in the last 168 hours. BNP: Invalid input(s): POCBNP CBG: Recent Labs  Lab 06/15/18 2147 06/16/18 0617 06/16/18 1704 06/16/18 2020 06/17/18 0636  GLUCAP 139* 188* 181* 143* 158*   D-Dimer No results for input(s): DDIMER in the last 72 hours. Hgb A1c No results for input(s): HGBA1C in the last 72 hours. Lipid Profile No results for input(s): CHOL, HDL, LDLCALC, TRIG, CHOLHDL, LDLDIRECT in the last 72 hours. Thyroid function studies No results for input(s): TSH, T4TOTAL, T3FREE, THYROIDAB in the last 72 hours.  Invalid input(s):  FREET3 Anemia work up No results for input(s): VITAMINB12, FOLATE, FERRITIN, TIBC, IRON, RETICCTPCT in the last 72 hours. Urinalysis    Component Value Date/Time   COLORURINE YELLOW 06/10/2018 0906   APPEARANCEUR HAZY (A) 06/10/2018 0906   LABSPEC 1.012 06/10/2018 0906   PHURINE 6.0 06/10/2018 0906   GLUCOSEU 50 (A) 06/10/2018 0906   GLUCOSEU NEGATIVE 12/12/2014 0929   HGBUR MODERATE (A) 06/10/2018 0906   BILIRUBINUR NEGATIVE 06/10/2018 0906   KETONESUR NEGATIVE 06/10/2018 0906   PROTEINUR 30 (A) 06/10/2018 0906   UROBILINOGEN 0.2 05/25/2015 1531   NITRITE NEGATIVE 06/10/2018 0906   LEUKOCYTESUR LARGE (A) 06/10/2018 0906   Sepsis Labs Invalid input(s): PROCALCITONIN,  WBC,  LACTICIDVEN Microbiology Recent Results (from the past 240 hour(s))  Culture, blood (routine x 2)     Status: None   Collection Time: 06/09/18  10:05 AM  Result Value Ref Range Status   Specimen Description BLOOD RIGHT FOREARM  Final   Special Requests   Final    BOTTLES DRAWN AEROBIC AND ANAEROBIC Blood Culture adequate volume   Culture   Final    NO GROWTH 5 DAYS Performed at [facility], [address], [place], [place] [address]    Report Status 06/14/2018 FINAL  Final  Urine Culture     Status: None   Collection Time: 06/10/18 11:04 AM  Result Value Ref Range Status   Specimen Description URINE, CLEAN CATCH  Final   Special Requests NONE  Final   Culture   Final    NO GROWTH Performed at [facility], [place] [address], [place], [place] [address]    Report Status 06/11/2018 FINAL  Final  MRSA PCR Screening     Status: None   Collection Time: 06/10/18 11:04 AM  Result Value Ref Range Status   MRSA by PCR NEGATIVE NEGATIVE Final    Comment:        The GeneXpert MRSA Assay (FDA approved for NASAL specimens only), is one component of a comprehensive MRSA colonization surveillance program. It is not intended to diagnose MRSA infection nor to guide or monitor treatment for MRSA  infections. Performed at [facility], [place] [address] [place], [place] [address]      Time coordinating discharge: Over 30 minutes  SIGNED:   Cristy Folks, MD  [facility] 06/17/2018, 11:49 AM   If 7PM-7AM, please contact night-coverage [email or web address] Password [email or web address]

## 2018-06-17 NOTE — Discharge Planning (Signed)
Report was given to RN at [facility]. This nurse remembered as pt left the building tele monitor was still on, spoke to RN at [facility], will hold at front desk for this nurse to pick up on 6/20 and return to 5n.

## 2018-06-17 NOTE — Social Work (Addendum)
Clinical Social Worker facilitated patient discharge including contacting patient family and facility to confirm patient discharge plans.  Clinical information faxed to facility and family agreeable with plan.    CSW arranged ambulance transport via [facility] to [facility].    RN to call [phone] (ask for Angela-Med Tech) to give report prior to discharge.   Clinical Social Worker will sign off for now as social work intervention is no longer needed. Please consult [place] again if new need arises.  Elissa Hefty, LCSW Clinical Social Worker [phone]

## 2018-06-17 NOTE — Consult Note (Signed)
[place] Nurse wound consult note Reason for Consult: Possible coccyx stage 2 or MASD Wound type: MASD to coccyx The coccyx area show slight evidence of moisture associated skin damage.  There was a sacral foam dressing present at the time of my evaluation in [facility] room 5N02.  Today the patient is on a low air loss mattress and has a heel lift boot present to the right foot.  She also has an external urinary containment device in use. Plan:  (1) continue the use of sacral foam dressings.  An order was entered for this.  (2) Turn the patient every 2 hours.  An order was entered for this.  (3) Do not use disposable pads beneath the patient's buttocks.  Order entered for this.  (4) Use only ONE Dermatherapy pad beneath the patient's buttocks.  Order entered for this. Monitor the area(s) for worsening of condition such as: Signs/symptoms of infection,  Increase in size,  Development of or worsening of odor, Development of pain, or increased pain at the affected locations.  Notify the medical team if any of these develop.  Thank you for the consult.  Discussed plan of care with the patient and bedside nurse.  [place] nurse will not follow at this time.  Please re-consult the [place] team if needed.  Val Riles, RN, MSN, CWOCN, CNS-BC, pager [phone]

## 2018-06-17 NOTE — Progress Notes (Addendum)
Orthopedic Trauma Service Progress Note   Patient ID: [name] MRN: [id number] DOB/AGE: 1915/01/09 84 y.o.  Subjective:  Sleeping  Not verbalizing    ROS As above  Objective:   VITALS:   Vitals:   06/15/18 2045 06/16/18 0653 06/16/18 2017 06/17/18 0600  BP: (!) 156/53 (!) 158/47 (!) 157/78 (!) 160/82  Pulse: 68 66 66 (!) 52  Resp: 14 14 12 15   Temp: 98.1 F (36.7 C) 97.8 F (36.6 C) 98.6 F (37 C) 98.4 F (36.9 C)  TempSrc: Oral Oral Axillary Axillary  SpO2: 95% 94% 100% 100%  Weight:    66.7 kg (147 lb)  Height:        Estimated body mass index is 23.02 kg/m as calculated from the following:   Height as of this encounter: 5\' 7"  (1.702 m).   Weight as of this encounter: 66.7 kg (147 lb).   Intake/Output      06/18 0701 - 06/19 0700 06/19 0701 - 06/20 0700   P.O. 0    Total Intake(mL/kg) 0 (0)    Urine (mL/kg/hr) 400 (0.2)    Drains     Stool 0    Total Output 400    Net -400         Urine Occurrence 1 x    Stool Occurrence 1 x      LABS  Results for orders placed or performed during the hospital encounter of 06/09/18 (from the past 24 hour(s))  Glucose, capillary     Status: Abnormal   Collection Time: 06/16/18  5:04 PM  Result Value Ref Range   Glucose-Capillary 181 (H) 65 - 99 mg/dL  Glucose, capillary     Status: Abnormal   Collection Time: 06/16/18  8:20 PM  Result Value Ref Range   Glucose-Capillary 143 (H) 65 - 99 mg/dL  CBC     Status: Abnormal   Collection Time: 06/17/18  3:47 AM  Result Value Ref Range   WBC 10.7 (H) 4.0 - 10.5 K/uL   RBC 3.25 (L) 3.87 - 5.11 MIL/uL   Hemoglobin 10.4 (L) 12.0 - 15.0 g/dL   HCT 32.8 (L) 36.0 - 46.0 %   MCV 100.9 (H) 78.0 - 100.0 fL   MCH 32.0 26.0 - 34.0 pg   MCHC 31.7 30.0 - 36.0 g/dL   RDW 14.2 11.5 - 15.5 %   Platelets 175 150 - 400 K/uL  Basic metabolic panel     Status: Abnormal   Collection Time: 06/17/18  3:47 AM  Result Value Ref Range   Sodium 144 135 -  145 mmol/L   Potassium 4.1 3.5 - 5.1 mmol/L   Chloride 110 101 - 111 mmol/L   CO2 24 22 - 32 mmol/L   Glucose, Bld 148 (H) 65 - 99 mg/dL   BUN 59 (H) 6 - 20 mg/dL   Creatinine, Ser 2.71 (H) 0.44 - 1.00 mg/dL   Calcium 10.9 (H) 8.9 - 10.3 mg/dL   GFR calc non Af Amer 13 (L) >60 mL/min   GFR calc Af Amer 16 (L) >60 mL/min   Anion gap 10 5 - 15  Glucose, capillary     Status: Abnormal   Collection Time: 06/17/18  6:36 AM  Result Value Ref Range   Glucose-Capillary 158 (H) 65 - 99 mg/dL     PHYSICAL EXAM:   Gen: in bed, moves legs around some with manipulation, NAD Ext:       Right Lower Extremity  Mild drainage on dressing, dressing removed              Traumatic wound continues to look stable                          No purulence                          No excessive erythema                         Surrounding skin looks good                         Min drainage noted upon dressing removal   + granulation tissue    No real necrotic tissue noted   No odor              Moves toes, ankle and leg spontaneously, not really following commands all that much              Swelling well controlled             + blister to heel stable             + DP pulse              Ext warm    Assessment/Plan: 8 Days Post-Op   Principal Problem:   Tibia and fibula open fracture, right Active Problems:   Hypothyroidism   Chronic kidney disease, stage III (moderate) (HCC)   Controlled diabetes mellitus with diabetic neuropathy, with long-term current use of insulin (HCC)   Unspecified atrial fibrillation (HCC)   Essential hypertension   HCAP (healthcare-associated pneumonia)   Acute lower UTI   Postoperative anemia   Aphasia   Acute metabolic encephalopathy   Hypervolemia   Anti-infectives (From admission, onward)   Start     Dose/Rate Route Frequency Ordered Stop   06/13/18 1000  cefdinir (OMNICEF) capsule 300 mg  Status:  Discontinued     300 mg Oral Every 12 hours  06/13/18 0845 06/13/18 0909   06/13/18 1000  cefdinir (OMNICEF) capsule 300 mg  Status:  Discontinued     300 mg Oral Every 12 hours 06/13/18 0911 06/16/18 1918   06/11/18 1330  ceFEPIme (MAXIPIME) 1 g in sodium chloride 0.9 % 100 mL IVPB  Status:  Discontinued     1 g 200 mL/hr over 30 Minutes Intravenous Every 24 hours 06/11/18 0915 06/13/18 0845   06/11/18 1100  vancomycin (VANCOCIN) IVPB 1000 mg/200 mL premix  Status:  Discontinued     1,000 mg 200 mL/hr over 60 Minutes Intravenous Every 48 hours 06/09/18 0954 06/09/18 1842   06/11/18 1000  vancomycin (VANCOCIN) IVPB 1000 mg/200 mL premix  Status:  Discontinued     1,000 mg 200 mL/hr over 60 Minutes Intravenous Every 48 hours 06/10/18 0945 06/10/18 2119   06/11/18 1000  amoxicillin-clavulanate (AUGMENTIN) 875-125 MG per tablet 1 tablet  Status:  Discontinued     1 tablet Oral Every 12 hours 06/11/18 0912 06/11/18 0915   06/10/18 1000  ceFAZolin (ANCEF) IVPB 1 g/50 mL premix  Status:  Discontinued     1 g 100 mL/hr over 30 Minutes Intravenous Every 24 hours 06/09/18 1905 06/10/18 0937   06/10/18 1000  ceFEPIme (MAXIPIME) 1 g in sodium chloride 0.9 % 100 mL IVPB  Status:  Discontinued  1 g 200 mL/hr over 30 Minutes Intravenous Every 24 hours 06/10/18 0907 06/11/18 0912   06/09/18 1234  vancomycin (VANCOCIN) powder  Status:  Discontinued       As needed 06/09/18 1234 06/09/18 1348   06/09/18 1233  tobramycin (NEBCIN) injection  Status:  Discontinued       As needed 06/09/18 1233 06/09/18 1348   06/09/18 1015  ceFAZolin (ANCEF) IVPB 2g/100 mL premix     2 g 200 mL/hr over 30 Minutes Intravenous On call to O.R. 06/09/18 1011 06/09/18 1143   06/09/18 1000  vancomycin (VANCOCIN) IVPB 1000 mg/200 mL premix     1,000 mg 200 mL/hr over 60 Minutes Intravenous  Once 06/09/18 0949 06/09/18 1134   06/09/18 0945  ceFEPIme (MAXIPIME) 2 g in sodium chloride 0.9 % 100 mL IVPB  Status:  Discontinued     2 g 200 mL/hr over 30 Minutes Intravenous   Once 06/09/18 0939 06/09/18 1842    .  POD/HD#: 20  84 y/o female s/p fall with grade 3 b open R tibia and fibula fracture    -fall   - Grade 3B open R tibia and fibula fracture s/p IMN             NWB R leg             wound continues to look very good   Daily dressing changes with 4x4 gauze, abd and kerlix   Leave mepitel layer down (white silicone sheet against the skin)   Follow up with ortho on Monday 06/22/2018               PT/OT             float heels off bed!!!!             air attress overlay as well m             Knee ROM as tolerated    - R heel pressure sore             WOC nurse consult appreciated              Float heels             Air mattress overlay    - Pain management:             Tylenol             Minimize narcotics so as not to exacerbate confusion    - ABL anemia/Hemodynamics             cbc stable    - Medical issues              Per primary                           - DVT/PE prophylaxis:             ASA 325 daily    - ID:              Ancef x 72 hours completed    - Metabolic Bone Disease:             + osteoporosis   - Activity:             Up with assistance   - FEN/GI prophylaxis/Foley/Lines:             Carb mod diet    - Impediments  to fracture healing:             Open fracture              DM    - Dispo:             ortho issues stable              follow up with ortho in 06/22/2018 for wound check   Daily dressing changes to R leg as noted above      Jari Pigg, PA-C [facility] [phone] Jenetta Downer) [phone] (C) 06/17/2018, 8:16 AM   I saw and examined the patient with Mr. Eddie Dibbles, communicating the findings and plan noted above, and changing the dressing together. We also communicated directly with wound care nurse.   Altamese Oberlin, MD [facility] [phone] (p)

## 2018-06-29 DEATH — deceased
